# Patient Record
Sex: Male | Born: 1935
Health system: Southern US, Community
[De-identification: ages and names within clinical notes are randomized; demographics above are authoritative.]

## PROBLEM LIST (undated history)

## (undated) DIAGNOSIS — I491 Atrial premature depolarization: Secondary | ICD-10-CM

## (undated) DIAGNOSIS — Z85828 Personal history of other malignant neoplasm of skin: Secondary | ICD-10-CM

## (undated) DIAGNOSIS — I493 Ventricular premature depolarization: Secondary | ICD-10-CM

## (undated) DIAGNOSIS — G4700 Insomnia, unspecified: Secondary | ICD-10-CM

## (undated) DIAGNOSIS — I10 Essential (primary) hypertension: Principal | ICD-10-CM

## (undated) DIAGNOSIS — K219 Gastro-esophageal reflux disease without esophagitis: Secondary | ICD-10-CM

## (undated) DIAGNOSIS — E785 Hyperlipidemia, unspecified: Secondary | ICD-10-CM

## (undated) DIAGNOSIS — C4490 Unspecified malignant neoplasm of skin, unspecified: Secondary | ICD-10-CM

## (undated) DIAGNOSIS — Z86010 Personal history of colonic polyps: Secondary | ICD-10-CM

## (undated) DIAGNOSIS — L570 Actinic keratosis: Secondary | ICD-10-CM

## (undated) DIAGNOSIS — L719 Rosacea, unspecified: Secondary | ICD-10-CM

## (undated) DIAGNOSIS — Z9189 Other specified personal risk factors, not elsewhere classified: Secondary | ICD-10-CM

## (undated) DIAGNOSIS — J984 Other disorders of lung: Secondary | ICD-10-CM

## (undated) DIAGNOSIS — Z87898 Personal history of other specified conditions: Secondary | ICD-10-CM

## (undated) DIAGNOSIS — M1990 Unspecified osteoarthritis, unspecified site: Secondary | ICD-10-CM

## (undated) DIAGNOSIS — I480 Paroxysmal atrial fibrillation: Secondary | ICD-10-CM

## (undated) DIAGNOSIS — I4819 Other persistent atrial fibrillation: Secondary | ICD-10-CM

## (undated) DIAGNOSIS — I499 Cardiac arrhythmia, unspecified: Secondary | ICD-10-CM

## (undated) HISTORY — DX: Paroxysmal atrial fibrillation: I48.0

## (undated) HISTORY — PX: POLYPECTOMY: SHX149

## (undated) HISTORY — DX: Hyperlipidemia, unspecified: E78.5

## (undated) HISTORY — PX: SKIN CANCER EXCISION: SHX779

## (undated) HISTORY — DX: Unspecified malignant neoplasm of skin, unspecified: C44.90

## (undated) HISTORY — DX: Personal history of colonic polyps: Z86.010

## (undated) HISTORY — DX: Gastro-esophageal reflux disease without esophagitis: K21.9

## (undated) HISTORY — DX: Ventricular premature depolarization: I49.3

## (undated) HISTORY — DX: Atrial premature depolarization: I49.1

## (undated) HISTORY — DX: Personal history of other malignant neoplasm of skin: Z85.828

## (undated) HISTORY — PX: LUMBAR DISC SURGERY: SHX700

## (undated) HISTORY — DX: Actinic keratosis: L57.0

## (undated) HISTORY — DX: Other specified personal risk factors, not elsewhere classified: Z91.89

## (undated) HISTORY — DX: Rosacea, unspecified: L71.9

## (undated) HISTORY — PX: EYE SURGERY: SHX253

## (undated) HISTORY — DX: Essential (primary) hypertension: I10

## (undated) HISTORY — DX: Personal history of other specified conditions: Z87.898

## (undated) HISTORY — DX: Insomnia, unspecified: G47.00

## (undated) HISTORY — PX: COLONOSCOPY W/ BIOPSIES AND POLYPECTOMY: SHX1376

## (undated) HISTORY — DX: Other disorders of lung: J98.4

---
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
## 2002-12-02 ENCOUNTER — Encounter: Payer: Self-pay | Admitting: Emergency Medicine

## 2002-12-03 ENCOUNTER — Inpatient Hospital Stay (HOSPITAL_COMMUNITY): Admission: EM | Admit: 2002-12-03 | Discharge: 2002-12-03 | Payer: Self-pay | Admitting: Emergency Medicine

## 2003-06-09 ENCOUNTER — Encounter: Admission: RE | Admit: 2003-06-09 | Discharge: 2003-06-09 | Payer: Self-pay | Admitting: Family Medicine

## 2003-06-09 ENCOUNTER — Encounter: Payer: Self-pay | Admitting: Family Medicine

## 2007-03-02 ENCOUNTER — Encounter: Payer: Self-pay | Admitting: Family Medicine

## 2007-03-26 ENCOUNTER — Ambulatory Visit: Payer: Self-pay | Admitting: Cardiovascular Disease

## 2007-03-26 ENCOUNTER — Encounter: Payer: Self-pay | Admitting: Cardiovascular Disease

## 2007-03-26 ENCOUNTER — Inpatient Hospital Stay (HOSPITAL_COMMUNITY): Admission: EM | Admit: 2007-03-26 | Discharge: 2007-03-27 | Payer: Self-pay | Admitting: Emergency Medicine

## 2007-04-06 ENCOUNTER — Encounter: Payer: Self-pay | Admitting: Internal Medicine

## 2007-04-06 ENCOUNTER — Ambulatory Visit: Payer: Self-pay

## 2007-04-13 ENCOUNTER — Ambulatory Visit: Payer: Self-pay | Admitting: Internal Medicine

## 2007-04-22 ENCOUNTER — Ambulatory Visit: Payer: Self-pay

## 2007-06-24 ENCOUNTER — Ambulatory Visit: Payer: Self-pay | Admitting: Internal Medicine

## 2007-08-10 ENCOUNTER — Ambulatory Visit: Payer: Self-pay | Admitting: Internal Medicine

## 2007-09-15 ENCOUNTER — Ambulatory Visit: Payer: Self-pay | Admitting: Internal Medicine

## 2007-09-15 ENCOUNTER — Ambulatory Visit: Payer: Self-pay

## 2007-12-17 ENCOUNTER — Ambulatory Visit: Payer: Self-pay | Admitting: Internal Medicine

## 2008-05-23 ENCOUNTER — Ambulatory Visit: Payer: Self-pay | Admitting: Internal Medicine

## 2008-06-08 ENCOUNTER — Ambulatory Visit: Payer: Self-pay | Admitting: Internal Medicine

## 2008-08-25 HISTORY — PX: ABLATION: SHX5711

## 2008-10-11 ENCOUNTER — Encounter: Payer: Self-pay | Admitting: Family Medicine

## 2009-01-02 ENCOUNTER — Encounter: Payer: Self-pay | Admitting: Internal Medicine

## 2009-01-12 ENCOUNTER — Encounter: Payer: Self-pay | Admitting: Internal Medicine

## 2009-03-13 ENCOUNTER — Ambulatory Visit: Payer: Self-pay | Admitting: Family Medicine

## 2009-03-13 DIAGNOSIS — L570 Actinic keratosis: Secondary | ICD-10-CM

## 2009-03-13 DIAGNOSIS — L719 Rosacea, unspecified: Secondary | ICD-10-CM

## 2009-03-13 DIAGNOSIS — Z860100 Personal history of colon polyps, unspecified: Secondary | ICD-10-CM | POA: Insufficient documentation

## 2009-03-13 DIAGNOSIS — I10 Essential (primary) hypertension: Secondary | ICD-10-CM | POA: Insufficient documentation

## 2009-03-13 DIAGNOSIS — E785 Hyperlipidemia, unspecified: Secondary | ICD-10-CM | POA: Insufficient documentation

## 2009-03-13 DIAGNOSIS — Z85828 Personal history of other malignant neoplasm of skin: Secondary | ICD-10-CM

## 2009-03-13 DIAGNOSIS — K219 Gastro-esophageal reflux disease without esophagitis: Secondary | ICD-10-CM | POA: Insufficient documentation

## 2009-03-13 HISTORY — DX: Personal history of colonic polyps: Z86.010

## 2009-03-13 HISTORY — DX: Personal history of other malignant neoplasm of skin: Z85.828

## 2009-03-13 HISTORY — DX: Gastro-esophageal reflux disease without esophagitis: K21.9

## 2009-03-13 HISTORY — DX: Personal history of colon polyps, unspecified: Z86.0100

## 2009-03-13 HISTORY — DX: Rosacea, unspecified: L71.9

## 2009-03-13 HISTORY — DX: Actinic keratosis: L57.0

## 2009-03-20 ENCOUNTER — Encounter: Payer: Self-pay | Admitting: Family Medicine

## 2009-03-20 ENCOUNTER — Encounter: Payer: Self-pay | Admitting: Internal Medicine

## 2009-03-22 ENCOUNTER — Encounter: Payer: Self-pay | Admitting: Family Medicine

## 2009-04-07 DIAGNOSIS — Z9189 Other specified personal risk factors, not elsewhere classified: Secondary | ICD-10-CM

## 2009-04-07 DIAGNOSIS — Z87898 Personal history of other specified conditions: Secondary | ICD-10-CM

## 2009-04-07 DIAGNOSIS — I4891 Unspecified atrial fibrillation: Secondary | ICD-10-CM | POA: Insufficient documentation

## 2009-04-07 DIAGNOSIS — I4819 Other persistent atrial fibrillation: Secondary | ICD-10-CM

## 2009-04-07 HISTORY — DX: Personal history of other specified conditions: Z87.898

## 2009-04-07 HISTORY — DX: Other specified personal risk factors, not elsewhere classified: Z91.89

## 2009-04-07 HISTORY — DX: Other persistent atrial fibrillation: I48.19

## 2009-04-17 ENCOUNTER — Encounter: Payer: Self-pay | Admitting: Family Medicine

## 2009-06-06 ENCOUNTER — Ambulatory Visit: Payer: Self-pay | Admitting: Family Medicine

## 2009-06-19 ENCOUNTER — Encounter: Payer: Self-pay | Admitting: Family Medicine

## 2009-06-19 ENCOUNTER — Encounter: Payer: Self-pay | Admitting: Internal Medicine

## 2009-09-11 ENCOUNTER — Ambulatory Visit: Payer: Self-pay | Admitting: Family Medicine

## 2009-09-11 DIAGNOSIS — J984 Other disorders of lung: Secondary | ICD-10-CM

## 2009-09-11 DIAGNOSIS — C4490 Unspecified malignant neoplasm of skin, unspecified: Secondary | ICD-10-CM

## 2009-09-11 DIAGNOSIS — G4700 Insomnia, unspecified: Secondary | ICD-10-CM | POA: Insufficient documentation

## 2009-09-11 HISTORY — DX: Other disorders of lung: J98.4

## 2009-09-11 HISTORY — DX: Insomnia, unspecified: G47.00

## 2009-09-11 HISTORY — DX: Unspecified malignant neoplasm of skin, unspecified: C44.90

## 2009-09-13 LAB — CONVERTED CEMR LAB
ALT: 14 units/L (ref 0–53)
AST: 25 units/L (ref 0–37)
Albumin: 3.8 g/dL (ref 3.5–5.2)
Alkaline Phosphatase: 71 units/L (ref 39–117)
Bilirubin, Direct: 0.1 mg/dL (ref 0.0–0.3)
Cholesterol: 129 mg/dL (ref 0–200)
Direct LDL: 83.6 mg/dL
HDL: 24.4 mg/dL — ABNORMAL LOW (ref >39.00)
Total Bilirubin: 0.7 mg/dL (ref 0.3–1.2)
Total CHOL/HDL Ratio: 5
Total Protein: 6.8 g/dL (ref 6.0–8.3)
Triglycerides: 207 mg/dL — ABNORMAL HIGH (ref 0.0–149.0)
VLDL: 41.4 mg/dL — ABNORMAL HIGH (ref 0.0–40.0)

## 2009-09-19 ENCOUNTER — Telehealth: Payer: Self-pay | Admitting: Family Medicine

## 2009-09-19 ENCOUNTER — Encounter: Payer: Self-pay | Admitting: Family Medicine

## 2009-09-24 ENCOUNTER — Telehealth: Payer: Self-pay | Admitting: Family Medicine

## 2009-10-05 ENCOUNTER — Ambulatory Visit: Payer: Self-pay | Admitting: Family Medicine

## 2010-03-12 ENCOUNTER — Ambulatory Visit: Payer: Self-pay | Admitting: Family Medicine

## 2010-03-12 LAB — CONVERTED CEMR LAB
Cholesterol, target level: 200 mg/dL
HDL goal, serum: 40 mg/dL
LDL Goal: 130 mg/dL

## 2010-05-07 ENCOUNTER — Ambulatory Visit: Payer: Self-pay | Admitting: Family Medicine

## 2010-05-08 LAB — CONVERTED CEMR LAB
ALT: 14 units/L (ref 0–53)
AST: 22 units/L (ref 0–37)
Albumin: 4 g/dL (ref 3.5–5.2)
Alkaline Phosphatase: 68 units/L (ref 39–117)
Bilirubin, Direct: 0.2 mg/dL (ref 0.0–0.3)
Cholesterol: 155 mg/dL (ref 0–200)
HDL: 26.1 mg/dL — ABNORMAL LOW (ref >39.00)
LDL Cholesterol: 102 mg/dL — ABNORMAL HIGH (ref 0–99)
Total Bilirubin: 0.8 mg/dL (ref 0.3–1.2)
Total CHOL/HDL Ratio: 6
Total Protein: 6.7 g/dL (ref 6.0–8.3)
Triglycerides: 135 mg/dL (ref 0.0–149.0)
VLDL: 27 mg/dL (ref 0.0–40.0)

## 2010-05-14 ENCOUNTER — Ambulatory Visit: Payer: Self-pay | Admitting: Family Medicine

## 2010-08-29 ENCOUNTER — Ambulatory Visit
Admission: RE | Admit: 2010-08-29 | Discharge: 2010-08-29 | Payer: Self-pay | Source: Home / Self Care | Attending: Family Medicine | Admitting: Family Medicine

## 2010-09-24 NOTE — Assessment & Plan Note (Signed)
Summary: 2 month rov/njr   Vital Signs:  Patient profile:   77 year old male Weight:      193 pounds O2 Sat:      97 % Temp:     97.6 degrees F oral Pulse rate:   56 / minute Pulse rhythm:   regular BP sitting:   138 / 70  Vitals Entered By: Lynann Beaver CMA (May 14, 2010 10:49 AM) CC: rov, Hypertension Management, Lipid Management Is Patient Diabetic? No Pain Assessment Patient in pain? no        History of Present Illness: Here for follow up for the following  Has history of dyslipidemia. Had been on Advicor but because of cost requested change of medication. Switched to lovastatin 20 mg. No side effects. Lipids reviewed with patient and basically no significant change other than mild elevation of cholesterol since changing to lovastatin. No history of CAD. Does have history of atrial fibrillation with previous ablation procedure.  Hypertension. Takes nadolol 20 mg daily. Home blood pressures reviewed and mostly 120s to 130 systolic. No headaches or dizziness.  No chest pain.  History of some chronic insomnia. Takes low-dose alprazolam and recently refilled. No gait or balance problems.  No hx of misuse.  No ETOH use.  Hypertension History:      He denies headache, chest pain, palpitations, dyspnea with exertion, orthopnea, peripheral edema, visual symptoms, neurologic problems, and syncope.        Positive major cardiovascular risk factors include male age 24 years old or older, hyperlipidemia, and hypertension.  Negative major cardiovascular risk factors include no history of diabetes, negative family history for ischemic heart disease, and non-tobacco-user status.        Further assessment for target organ damage reveals no history of ASHD, stroke/TIA, or peripheral vascular disease.    Lipid Management History:      Positive NCEP/ATP III risk factors include male age 24 years old or older, HDL cholesterol less than 40, and hypertension.  Negative NCEP/ATP III risk  factors include non-diabetic, no family history for ischemic heart disease, non-tobacco-user status, no ASHD (atherosclerotic heart disease), no prior stroke/TIA, no peripheral vascular disease, and no history of aortic aneurysm.      Current Medications (verified): 1)  Lovastatin 20 Mg Tabs (Lovastatin) .... One By Mouth At Bedtime 2)  Nadolol 20 Mg Tabs (Nadolol) .... One Tab Daily 3)  Aspirin 325 Mg Tabs (Aspirin) .... Once Daily 4)  Prilosec Otc 20 Mg Tbec (Omeprazole Magnesium) .... Once Daily 5)  Saw Palmetto 1000 Mg Caps (Saw Palmetto (Serenoa Repens)) .... Qd 6)  Metronidazole 0.75 % Crea (Metronidazole) .... As Needed 7)  Efudex 5 % Crea (Fluorouracil) .... As Needed 8)  Alprazolam 0.25 Mg Tabs (Alprazolam) .... One By Mouth At Bedtime As Needed Insomnia 9)  Metrogel 1 % Gel (Metronidazole) .... Daily On Face/nose As Directed  Allergies (verified): No Known Drug Allergies  Past History:  Past Medical History: Last updated: 04/07/2009 Kidney stones BENIGN PROSTATIC HYPERTROPHY, HX OF (ICD-V13.8) ATRIAL FIBRILLATION (ICD-427.31) ACTINIC KERATOSIS (ICD-702.0) ROSACEA (ICD-695.3) HYPERTENSION (ICD-401.9) HYPERLIPIDEMIA (ICD-272.4) GERD (ICD-530.81) COLONIC POLYPS, HX OF (ICD-V12.72) SKIN CANCER, HX OF (ICD-V10.83)    Review of Systems  The patient denies anorexia, fever, weight loss, weight gain, chest pain, syncope, dyspnea on exertion, peripheral edema, prolonged cough, headaches, hemoptysis, abdominal pain, melena, hematochezia, and depression.    Physical Exam  General:  Well-developed,well-nourished,in no acute distress; alert,appropriate and cooperative throughout examination Head:  Normocephalic and atraumatic without obvious  abnormalities. No apparent alopecia or balding. Eyes:  pupils equal, pupils round, and pupils reactive to light.   Ears:  External ear exam shows no significant lesions or deformities.  Otoscopic examination reveals clear canals, tympanic  membranes are intact bilaterally without bulging, retraction, inflammation or discharge. Hearing is grossly normal bilaterally. Mouth:  Oral mucosa and oropharynx without lesions or exudates.  Teeth in good repair. Neck:  No deformities, masses, or tenderness noted. Lungs:  Normal respiratory effort, chest expands symmetrically. Lungs are clear to auscultation, no crackles or wheezes. Heart:  Normal rate and regular rhythm. S1 and S2 normal without gallop, murmur, click, rub or other extra sounds. Extremities:  no edema or clubbing. Cervical Nodes:  No lymphadenopathy noted Psych:  normally interactive, good eye contact, not anxious appearing, and not depressed appearing.     Impression & Recommendations:  Problem # 1:  INSOMNIA, CHRONIC (ICD-307.42) refilled alprazolam for as needed use.  Problem # 2:  HYPERTENSION (ICD-401.9) borderline control.   Cont to monitor. His updated medication list for this problem includes:    Nadolol 20 Mg Tabs (Nadolol) ..... One tab daily  Problem # 3:  HYPERLIPIDEMIA (ICD-272.4) labs reviewed.  HDL      essentially unchanged off Niacin. His updated medication list for this problem includes:    Lovastatin 20 Mg Tabs (Lovastatin) ..... One by mouth at bedtime  Complete Medication List: 1)  Lovastatin 20 Mg Tabs (Lovastatin) .... One by mouth at bedtime 2)  Nadolol 20 Mg Tabs (Nadolol) .... One tab daily 3)  Aspirin 325 Mg Tabs (Aspirin) .... Once daily 4)  Prilosec Otc 20 Mg Tbec (Omeprazole magnesium) .... Once daily 5)  Saw Palmetto 1000 Mg Caps (Saw palmetto (serenoa repens)) .... Qd 6)  Metronidazole 0.75 % Crea (Metronidazole) .... As needed 7)  Efudex 5 % Crea (Fluorouracil) .... As needed 8)  Alprazolam 0.25 Mg Tabs (Alprazolam) .... One by mouth at bedtime as needed insomnia 9)  Metrogel 1 % Gel (Metronidazole) .... Daily on face/nose as directed  Hypertension Assessment/Plan:      The patient's hypertensive risk group is category B: At  least one risk factor (excluding diabetes) with no target organ damage.  His calculated 10 year risk of coronary heart disease is 33 %.  Today's blood pressure is 138/70.    Lipid Assessment/Plan:      Based on NCEP/ATP III, the patient's risk factor category is "2 or more risk factors and a calculated 10 year CAD risk of > 20%".  The patient's lipid goals are as follows: Total cholesterol goal is 200; LDL cholesterol goal is 130; HDL cholesterol goal is 40; Triglyceride goal is 150.    Patient Instructions: 1)  Please schedule a follow-up appointment in 6 months .

## 2010-09-24 NOTE — Assessment & Plan Note (Signed)
Summary: 6 month rov/njr   Vital Signs:  Patient profile:   77 year old male Weight:      187 pounds Temp:     98.3 degrees F oral BP sitting:   140 / 84  (left arm) Cuff size:   regular  Vitals Entered By: Sid Falcon LPN (September 11, 2009 10:28 AM) CC: 6 month folow-up   History of Present Illness: Patient here for followup regarding multiple items.  History atrial fibrillation. Ablation procedure at [facility]. Sinus rhythm for several months now. History dyslipidemia treated with Advicor. Patient intolerant to generic in the past. Would like to remain on brand-name Advicor. Due for lipid repeat.  Patient had nonspecific pulmonary nodule chest x-ray last year and CT chest last Nov revealing nonspecific nodule with recommended 1 year f/u. No cough, weight loss, dyspnea,  or appetite changes. Nonsmoker. Needs repeat.  History of chronic insomnia. No caffeine late in the day. No napping. No alcohol use. Tylenol PM causes a hangover drowsiness. Has tried low-dose alprazolam 0.25 mg the past which works well. Sleep hygiene issues discussed.  Has seen dermatology after recent referral and multiple skin cancers-squamous and basal cell.  He continues f/u there.   Allergies (verified): No Known Drug Allergies  Past History:  Past Medical History: Last updated: 04/07/2009 Kidney stones BENIGN PROSTATIC HYPERTROPHY, HX OF (ICD-V13.8) ATRIAL FIBRILLATION (ICD-427.31) ACTINIC KERATOSIS (ICD-702.0) ROSACEA (ICD-695.3) HYPERTENSION (ICD-401.9) HYPERLIPIDEMIA (ICD-272.4) GERD (ICD-530.81) COLONIC POLYPS, HX OF (ICD-V12.72) SKIN CANCER, HX OF (ICD-V10.83)    Past Surgical History: Last updated: 04/07/2009 * HX OF LUMBAR DISK  SURGERY DUE TO RUPTURE POLYPECTOMY, HX OF (ICD-V15.9)  Family History: Last updated: 04/07/2009 Family history colon cancer, parent Family history heart disease, parent, blood relative Family History Hypertension Father:Died at age 75 Colon  cancer Mother:Died at age 64 Cancer and dementia Siblings: Brother and Sister hx of CAD both have had CABG  Social History: Last updated: 03/13/2009 Occupation: Married Alcohol use-no Previous smoker  Review of Systems  The patient denies anorexia, fever, weight loss, weight gain, chest pain, syncope, dyspnea on exertion, peripheral edema, prolonged cough, headaches, hemoptysis, abdominal pain, melena, hematochezia, severe indigestion/heartburn, hematuria, and incontinence.    Physical Exam  General:  Well-developed,well-nourished,in no acute distress; alert,appropriate and cooperative throughout examination Mouth:  Oral mucosa and oropharynx without lesions or exudates.  Teeth in good repair. Neck:  No deformities, masses, or tenderness noted. Lungs:  Normal respiratory effort, chest expands symmetrically. Lungs are clear to auscultation, no crackles or wheezes. Heart:  normal rate, regular rhythm, and no gallop.   Extremities:  no peripheral edema or clubbing Psych:  Oriented X3, normally interactive, good eye contact, not anxious appearing, and not depressed appearing.     Impression & Recommendations:  Problem # 1:  ATRIAL FIBRILLATION (ICD-427.31) currently sinus rhythm The following medications were removed from the medication list:    Propafenone Hcl 225 Mg Tabs (Propafenone hcl) ..... One tab two times a day His updated medication list for this problem includes:    Nadolol 20 Mg Tabs (Nadolol) .Marland Kitchen... 1/2 tab daily    Aspirin 325 Mg Tabs (Aspirin) ..... Once daily  Problem # 2:  INSOMNIA, CHRONIC (ICD-307.42) Handout given.  Sleep hygiene discussed.  Low dose alprazolam 0.25 at bedtime and he wil try to use sparingly.  Problem # 3:  PULMONARY NODULE (ICD-518.89) CXR and will likely need f/u CT chest. Orders: T-2 View CXR (71020TC)  Problem # 4:  HYPERLIPIDEMIA (ICD-272.4) Get prior approval to remain  on Advicor. His updated medication list for this problem  includes:    Advicor 500-20 Mg Xr24h-tab (Niacin-lovastatin) ..... Once daily  Orders: TLB-Lipid Panel (80061-LIPID) TLB-Hepatic/Liver Function Pnl (80076-HEPATIC) Venipuncture (16109)  Problem # 5:  CARCINOMA, SKIN, SQUAMOUS CELL (ICD-173.9) followed  now by dermatology.  Complete Medication List: 1)  Advicor 500-20 Mg Xr24h-tab (Niacin-lovastatin) .... Once daily 2)  Nadolol 20 Mg Tabs (Nadolol) .... 1/2 tab daily 3)  Aspirin 325 Mg Tabs (Aspirin) .... Once daily 4)  Prilosec Otc 20 Mg Tbec (Omeprazole magnesium) .... Once daily 5)  Saw Palmetto 1000 Mg Caps (Saw palmetto (serenoa repens)) .... Qd 6)  Metronidazole 0.75 % Crea (Metronidazole) .... As needed 7)  Efudex 5 % Crea (Fluorouracil) .... As needed 8)  Alprazolam 0.25 Mg Tabs (Alprazolam) .... One by mouth at bedtime as needed insomnia  Patient Instructions: 1)  Please schedule a follow-up appointment in 6 months .  Prescriptions: ALPRAZOLAM 0.25 MG TABS (ALPRAZOLAM) one by mouth at bedtime as needed insomnia  #30 x 5   Entered and Authorized by:   Evelena Peat MD   Signed by:   Evelena Peat MD on 09/11/2009   Method used:   Print then Give to Patient   RxID:   [id number]

## 2010-09-24 NOTE — Assessment & Plan Note (Signed)
Summary: 6 mo rov/mm/pt rsc/cjr   Vital Signs:  Patient profile:   77 year old male Weight:      190 pounds Temp:     98.7 degrees F oral BP sitting:   150 / 84  (left arm) Cuff size:   regular  Vitals Entered By: Sid Falcon LPN (March 12, 2010 10:55 AM)  Serial Vital Signs/Assessments:  Time      Position  BP       Pulse  Resp  Temp     By                     144/70                         Evelena Peat MD  CC: 6 month follow-up, Lipid Management, Hypertension Management   History of Present Illness: Here for follow up multiple med problems.  Hx a fib with prior ablation procedure and no recurrence.  Hypertension History:      He denies headache, chest pain, palpitations, dyspnea with exertion, orthopnea, peripheral edema, visual symptoms, neurologic problems, and syncope.  Further comments include: Bps running 140s systolic at home.        Positive major cardiovascular risk factors include male age 48 years old or older, hyperlipidemia, and hypertension.  Negative major cardiovascular risk factors include no history of diabetes and non-tobacco-user status.        Further assessment for target organ damage reveals no history of ASHD, stroke/TIA, or peripheral vascular disease.    Lipid Management History:      Positive NCEP/ATP III risk factors include male age 92 years old or older, HDL cholesterol less than 40, and hypertension.  Negative NCEP/ATP III risk factors include non-diabetic, non-tobacco-user status, no ASHD (atherosclerotic heart disease), no prior stroke/TIA, no peripheral vascular disease, and no history of aortic aneurysm.      Preventive Screening-Counseling & Management  Alcohol-Tobacco     Smoking Status: never  Allergies (verified): No Known Drug Allergies  Social History: Smoking Status:  never  Review of Systems  The patient denies anorexia, weight loss, chest pain, syncope, dyspnea on exertion, peripheral edema, abdominal pain, melena,  hematochezia, and severe indigestion/heartburn.    Physical Exam  General:  Well-developed,well-nourished,in no acute distress; alert,appropriate and cooperative throughout examination Mouth:  Oral mucosa and oropharynx without lesions or exudates.  Teeth in good repair. Neck:  No deformities, masses, or tenderness noted. Lungs:  Normal respiratory effort, chest expands symmetrically. Lungs are clear to auscultation, no crackles or wheezes. Heart:  normal rate and regular rhythm.   Extremities:  no edema.   Impression & Recommendations:  Problem # 1:  HYPERTENSION (ICD-401.9) Assessment Deteriorated increase Nadalol to one full tablet daily. His updated medication list for this problem includes:    Nadolol 20 Mg Tabs (Nadolol) ..... One tab daily  Problem # 2:  HYPERLIPIDEMIA (ICD-272.4)  pt requests change to less expensive med than Advicor.  Will start Lovastitin 20 mg by mouth once daily and will reassess lipids in 2 months. His updated medication list for this problem includes:    Lovastatin 20 Mg Tabs (Lovastatin) ..... One by mouth at bedtime  Orders: Prescription Created Electronically [phone])  Problem # 3:  ATRIAL FIBRILLATION (ICD-427.31) Assessment: Improved  His updated medication list for this problem includes:    Nadolol 20 Mg Tabs (Nadolol) ..... One tab daily    Aspirin 325 Mg Tabs (Aspirin) .Marland KitchenMarland KitchenMarland KitchenMarland Kitchen  Once daily  Complete Medication List: 1)  Lovastatin 20 Mg Tabs (Lovastatin) .... One by mouth at bedtime 2)  Nadolol 20 Mg Tabs (Nadolol) .... One tab daily 3)  Aspirin 325 Mg Tabs (Aspirin) .... Once daily 4)  Prilosec Otc 20 Mg Tbec (Omeprazole magnesium) .... Once daily 5)  Saw Palmetto 1000 Mg Caps (Saw palmetto (serenoa repens)) .... Qd 6)  Metronidazole 0.75 % Crea (Metronidazole) .... As needed 7)  Efudex 5 % Crea (Fluorouracil) .... As needed 8)  Alprazolam 0.25 Mg Tabs (Alprazolam) .... One by mouth at bedtime as needed insomnia 9)  Metrogel 1 % Gel  (Metronidazole) .... Daily on face/nose as directed  Hypertension Assessment/Plan:      The patient's hypertensive risk group is category B: At least one risk factor (excluding diabetes) with no target organ damage.  Today's blood pressure is 150/84.    Lipid Assessment/Plan:      Based on NCEP/ATP III, the patient's risk factor category is "0-1 risk factors".  The patient's lipid goals are as follows: Total cholesterol goal is 200; LDL cholesterol goal is 130; HDL cholesterol goal is 40; Triglyceride goal is 150.    Patient Instructions: 1)  Please schedule a follow-up appointment in 2 months.  2)  Hepatic Panel prior to visit ICD-9: 272.4 3)  Lipid panel prior to visit ICD-9 : 272.4 Prescriptions: NADOLOL 20 MG TABS (NADOLOL) one tab daily  #90 x 3   Entered and Authorized by:   Evelena Peat MD   Signed by:   Evelena Peat MD on 03/12/2010   Method used:   Faxed to ...       [facility]order)             , [place]         Ph:        Fax: [phone]   RxID:   [id number] LOVASTATIN 20 MG TABS (LOVASTATIN) one by mouth at bedtime  #90 x 3   Entered and Authorized by:   Evelena Peat MD   Signed by:   Evelena Peat MD on 03/12/2010   Method used:   Electronically to        [facility]  [phone]* (retail)       [address]       [place], [place]  [address]       Ph: [phone] or [phone]       Fax: [phone]   RxID:   [id number]

## 2010-09-24 NOTE — Progress Notes (Signed)
Summary: Prior Authorization Advicor  Phone Note Call from Patient   Caller: Patient [phone] Reason for Call: Talk to Nurse Summary of Call: Pts wife called to ck status of Prior Authorization.... Pts wife stated that she contacted Pharmacy / Ins. and they were supposed to have faxed (for a second time) the PA form to [employer].Marland KitchenMarland KitchenMarland KitchenPts wife adv that her husband is running out of meds and ins won't cover till PA is completed and faxed in.Marland KitchenMarland KitchenMarland KitchenPts wife was adv that if fax was received then it would have been passed along to nurse for completion... Pts wife adv that she can be reached at [phone] with any questions or concerns. Initial call taken by: Debbra Riding,  September 24, 2009 10:42 AM  Follow-up for Phone Call        This fax did not come to Triage. Follow-up by: Lynann Beaver CMA,  September 24, 2009 10:47 AM  Additional Follow-up for Phone Call Additional follow up Details #1::        PA on Dr Lucie Leather desk Sid Falcon LPN  September 24, 2009 12:29 PM  PA form completed. Additional Follow-up by: Evelena Peat MD,  September 24, 2009 5:29 PM    Additional Follow-up for Phone Call Additional follow up Details #2::    Pt informed prior authorization faxed to [phone], confirmation received, pt informed Document will be scanned to chart Follow-up by: Sid Falcon LPN,  September 25, 2009 10:53 AM

## 2010-09-24 NOTE — Progress Notes (Signed)
Summary: Advicor Prior Authorization   Phone Note Call from Patient Call back at Home Phone [phone]   Caller: Spouse Call For: Evelena Peat MD Summary of Call: Pt wife calling to check on at Prior Authorization for mail order Advicor 500-20, once daily.  [facility] said they would be faxing a Dispensing optician for mail order.  Insurance is not paying, pt is almost out.  Initial call taken by: Sid Falcon LPN,  September 19, 2009 9:51 AM  Follow-up for Phone Call        I told pt we do not have this prior authorization unless it is in Dr. Lucie Leather mail?? Follow-up by: Lynann Beaver CMA,  September 19, 2009 10:58 AM  Additional Follow-up for Phone Call Additional follow up Details #1::        Called pt wife, told her we do not have the PA request.  Wife has requested they fax another. Sid Falcon LPN  September 19, 2009 11:11 AM

## 2010-09-24 NOTE — Medication Information (Signed)
Summary: Refill for [facility]itor/[facility]  Refill for Lipitor/[facility]   Imported By: Maryln Gottron 09/28/2009 15:14:50  _____________________________________________________________________  External Attachment:    Type:   Image     Comment:   External Document

## 2010-09-26 NOTE — Assessment & Plan Note (Signed)
Summary: stitches removal from finger/njr   Vital Signs:  Patient profile:   77 year old male Temp:     97.8 degrees F oral BP sitting:   120 / 62  (left arm) Cuff size:   regular  Vitals Entered By: Sid Falcon LPN (August 29, 2010 11:38 AM)  History of Present Illness: L thumb laceration 9 days ago. Skill saw and wood piece that was cut flew into thumb with laceration.  Went to urgent care for sutures-7 placed and only 4 left at this time. No pain, numbness, or weakness.  No retained foreign bodies. Last tetanus unknown.  Meds reviewed.  hx A FIB with ablation procedure and doing well.  Allergies (verified): 1)  Augmentin (Amoxicillin-Pot Clavulanate)  Past History:  Past Medical History: Last updated: 04/07/2009 Kidney stones BENIGN PROSTATIC HYPERTROPHY, HX OF (ICD-V13.8) ATRIAL FIBRILLATION (ICD-427.31) ACTINIC KERATOSIS (ICD-702.0) ROSACEA (ICD-695.3) HYPERTENSION (ICD-401.9) HYPERLIPIDEMIA (ICD-272.4) GERD (ICD-530.81) COLONIC POLYPS, HX OF (ICD-V12.72) SKIN CANCER, HX OF (ICD-V10.83)   PMH reviewed for relevance  Physical Exam  General:  Well-developed,well-nourished,in no acute distress; alert,appropriate and cooperative throughout examination Lungs:  Normal respiratory effort, chest expands symmetrically. Lungs are clear to auscultation, no crackles or wheezes. Heart:  normal rate and regular rhythm.   Extremities:  L thumb flap laceration with no signs of infection.  4 sutures in place.  REmoved without difficulty and steristrips applied.  Full ROM thumb. Neurologic:  normal sensation thumb.    Impression & Recommendations:  Problem # 1:  LACERATION, LEFT THUMB (ICD-883.0) Assessment New sutures removed and steristrips placed.  Healing well. Tdap given.  Problem # 2:  ATRIAL FIBRILLATION (ICD-427.31)  His updated medication list for this problem includes:    Nadolol 20 Mg Tabs (Nadolol) ..... One tab daily    Aspirin 325 Mg Tabs (Aspirin)  ..... Once daily  Complete Medication List: 1)  Lovastatin 20 Mg Tabs (Lovastatin) .... One by mouth at bedtime 2)  Nadolol 20 Mg Tabs (Nadolol) .... One tab daily 3)  Aspirin 325 Mg Tabs (Aspirin) .... Once daily 4)  Prilosec Otc 20 Mg Tbec (Omeprazole magnesium) .... Once daily 5)  Saw Palmetto 1000 Mg Caps (Saw palmetto (serenoa repens)) .... Qd 6)  Metronidazole 0.75 % Crea (Metronidazole) .... As needed 7)  Efudex 5 % Crea (Fluorouracil) .... As needed 8)  Alprazolam 0.25 Mg Tabs (Alprazolam) .... One by mouth at bedtime as needed insomnia 9)  Metrogel 1 % Gel (Metronidazole) .... Daily on face/nose as directed 10)  Vitamin D 1000 Unit Tabs (Cholecalciferol) .... 2 tabs daily  Other Orders: Tdap => 7yrs IM (04540) Admin 1st Vaccine (98119)   Orders Added: 1)  Tdap => 60yrs IM [90715] 2)  Admin 1st Vaccine [90471] 3)  Est. Patient Level III [14782]   Immunizations Administered:  Tetanus Vaccine:    Vaccine Type: Tdap    Site: left deltoid    Mfr: GlaxoSmithKline    Dose: 0.5 ml    Route: IM    Given by: Sid Falcon LPN    Exp. Date: 06/14/2012    Lot #: [id number]    VIS given: 07/12/08 version given August 29, 2010.   Immunizations Administered:  Tetanus Vaccine:    Vaccine Type: Tdap    Site: left deltoid    Mfr: GlaxoSmithKline    Dose: 0.5 ml    Route: IM    Given by: Sid Falcon LPN    Exp. Date: 06/14/2012    Lot #: [id number]  VIS given: 07/12/08 version given August 29, 2010.

## 2010-10-08 ENCOUNTER — Telehealth: Payer: Self-pay | Admitting: Family Medicine

## 2010-10-08 DIAGNOSIS — E785 Hyperlipidemia, unspecified: Secondary | ICD-10-CM

## 2010-10-08 NOTE — Telephone Encounter (Signed)
to Pt called and needs script Lovastatin 20mg   3 month supply. [facility] [phone].

## 2010-10-25 MED ORDER — LOVASTATIN 20 MG PO TABS: 20.0000 mg | ORAL_TABLET | Freq: Every day | ORAL | Status: DC

## 2010-10-25 NOTE — Telephone Encounter (Signed)
Will send

## 2010-10-28 MED ORDER — LOVASTATIN 20 MG PO TABS: 20.0000 mg | ORAL_TABLET | Freq: Every day | ORAL | Status: DC

## 2010-10-28 NOTE — Telephone Encounter (Signed)
Addended by: Sid Falcon on: 10/28/2010 06:31 PM   Modules accepted: Orders

## 2010-12-10 ENCOUNTER — Encounter: Payer: Self-pay | Admitting: Family Medicine

## 2011-01-07 NOTE — Discharge Summary (Signed)
[name], [name] NO.:  [id number]   MEDICAL RECORD NO.:  [id number]          PATIENT TYPE:  INP   LOCATION:  4709                         FACILITY:  [facility]   PHYSICIAN:  Steven Canning. Ladona Ridgel, MD    DATE OF BIRTH:  1932/01/14   DATE OF ADMISSION:  03/26/2007  DATE OF DISCHARGE:  03/27/2007                               DISCHARGE SUMMARY   PROCEDURES:  None.   TIME SPENT AT DISCHARGE:  33 minutes.   PRIMARY FINAL DISCHARGE DIAGNOSES:  1. Paroxysmal atrial fibrillation with rapid ventricular response.  2. Hypertension.  3. Hyperlipidemia.  4. Benign prostatic hypertrophy.  5. Gastroesophageal reflux disease.  6. Rosacea.  7. History of ruptured lumbar disk with surgical repair.  8. Status post colonoscopy with polypectomy.  9. History of exercise Myoview in 2004 showing no scar or ischemia and      an ejection fraction of 61%.  10.Family history of coronary artery disease in his siblings.  11.Intolerance to antihistamines with urinary retention.\   HOSPITAL COURSE:  [name] is a 77 year old male with a history of  paroxysmal atrial fibrillation.  He has been managed by p.r.n. beta-  blockers, but he was having more frequent episodes and his wife reports  that he was weak and hypotensive with a systolic blood pressure in the  80s.  He came to the emergency room and was admitted for further  evaluation.   He was initially placed on heparin for anticoagulation and Coreg.  He  spontaneously converted to sinus rhythm.  A TSH and troponin were within  normal limits.  The situation was discussed by Dr. Diona Browner with the  patient and his wife.  He is to continue aspirin, and we will try  increasing it to 325 mg daily to see is this is tolerated from a GI  standpoint.  His Norvasc was decreased so that we could add Coreg 3.125  b.i.d.  He is not to take the p.r.n. Toprol-XL, as his wife stated she  was afraid to give it to him because of his hypotension anyway.   He is  to follow up with Dr. Graciela Husbands and get an outpatient echocardiogram prior  to that visit.  Further discussion of antiarrhythmics and the  possibility of Coumadin is to be done at that point.   ACTIVITY:  His activity level is to be increased gradually.   DIET:  He is to stick to a low-sodium, heart healthy diet.   FOLLOW UP:  He is to follow up with Dr. Graciela Husbands, and the office will call.  He is to follow up with Dr. Caryl Never as needed.   DISCHARGE MEDICATIONS:  1. Coreg 3.125 mg b.i.d.  2. Norvasc 5 mg one-half tablet daily  3. Aspirin 325 mg daily.  4. Advicor 500/20 daily.  5. Benicar 20 mg a day.  6. Flomax 0.4 mg daily  7. Topical Flagyl p.r.n.  8. Prilosec 20 mg a day.  9. Vitamin E 400 IU daily  10.He is not to take Toprol-XL 25 mg.      Steven Demark, PA-C  Steven Canning. Ladona Ridgel, MD  Electronically Signed    RB/MEDQ  D:  03/27/2007  T:  03/28/2007  Job:  [id number]   cc:   [name], M.D.

## 2011-01-07 NOTE — Assessment & Plan Note (Signed)
[facility]                         ELECTROPHYSIOLOGY OFFICE NOTE   NAME:[name], [name]                     MRN:          [id number]  DATE:08/10/2007                            DOB:          14-Feb-1932    Mr. and Mrs. Yepes come in today.  They are the parent of Artis Flock, who is now doing increasing poorly.  His tolerance of his various  drugs was best for nadolol and Toprol and very poor for verapamil.  The  nadolol seemed to be associated with fewer episodes of atrial  fibrillation.  Blood pressures were also somewhat better, that it is not  quite as low in the 110-130 range, which is important, as most of his  presyncope was felt to be vasomotor.  He has some modest degree of  bradycardia on the data that his wife brings with her with heart rates  in the high 40s and low 50s.  There are no untoward __________  that I  can assign to this.   His medications currently include the Benicar, Prilosec, aspirin,  Advicor, and the nadolol 20 mg a day.   PHYSICAL EXAMINATION:  His blood pressure was 122/72.  His pulse was 60.  LUNGS:  Clear.  CARDIAC:  Heart sounds were regular.  SKIN:  Warm and dry.  EXTREMITIES:  No edema.   IMPRESSION:  1. Paroxysmal atrial fibrillation.  2. Modest bradycardia.  3. Presyncope that I think is vasomotor, related to atrial      fibrillation.   Mr. Jacuinde is actually doing quite well.  Will plan to continue him on  his nadolol 20 mg a day.  In the event that we have evidence that his  bradycardia is a problem, I would try an ISA beta blocker.   At this point, we will defer using flecainide, as his symptoms seem to  be well controlled on the nadolol.  I will see him in the spring.     Duke Salvia, MD, [facility]  Electronically Signed    SCK/MedQ  DD: 08/10/2007  DT: 08/10/2007  Job #: [id number]

## 2011-01-07 NOTE — Letter (Signed)
April 13, 2007    Jonelle Sidle, MD  [phone] [address]  [place], [place] [address]   RE:  Steven, Holder  MRN:  [id number]  /  DOB:  [birth year]   Dear Doreatha Martin,   It was a pleasure to see [name] because of his atrial  fibrillation.   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  Over the  last 6-8 weeks he has had increasing problems with it, with both more  frequent and more prolonged episodes.  Notably, his episodes have also  been associated with significant hypotension with blood pressures in the  70-80 range.  This has been true notwithstanding the relative slowing of  his heart rate, that was accomplished, after you gave him carvedilol in  the hospital a couple of weeks ago.  Further evaluations at that time  also included a normal CBC, normal thyroids, and normal chemistries.  An  echo done just prior to this visit demonstrated normal left ventricular  function, and normal left atrial size.   His strong embolic risk factors are notable for his blood pressure only,  giving a [place] score of 1.  His cardiac risk factors are broadly  negative.   The other thing that has happened in his medical regimen has been the  addition of Flomax, but this post-dates the aggravation of his symptoms.   MEDICATIONS CURRENTLY INCLUDE:  1. Flomax.  2. Carvedilol 3.125.  3. Benicar 20  4. Amlodipine 2.5.  5. As well as aspirin.   On examination today his blood pressure is 122/72 with a pulse of 67.  Blood pressures from home range in the 100-120 range.  His neck veins  were flat.  His carotids were brisk and full bilaterally without bruits.  The back was without kyphosis or scoliosis.  His lungs were clear.  Heart sounds were regular without murmurs or gallops.  The abdomen was  soft.  The extremities were without peripheral edema.   Electrocardiogram dated today demonstrated a sinus rhythm.  Electrocardiogram from the hospital was clearly atrial fibrillation.   IMPRESSION:  1. Paroxysmal atrial fibrillation with a rapid, and not so rapid,      ventricular response.  2. Significant hypotension associated with #1.  3. Thromboembolic risk factors notable for hypertension.   Mr. Starnes is having recurrent paroxysms of rapid, and not so rapid  atrial fibrillation; each of which has been associated with significant  hypotension.  Given that, I suspect that this represents a neurally  mediated reflex.  This is true for most supraventricular tachycardias  that are associated with profound hypertension.  As such, the treatment  of this is going to the directed at preventing his atrial fibrillation  as opposed to controlling the ventricular response.  Your carvedilol has  had some benefit, but not significant when typically the beta blockers  are associated with only about a 10-15% reduction in atrial fibrillation  risk.  To that end, I think, that antiarrhythmic drug therapy is likely  going to be necessary, if his frequency of symptoms continues as it is.  To that end, I have scheduled him for a stress Myoview scan that would  allow [place] to use a 1C antiarrhythmic drug.  We will plan to discuss this  with him after the study.   In the interim, I have asked him to discontinue his amlodipine and  double up his carvedilol  to hopefully get some heart rate slowing  benefit.   Thanks very much for asking [place] to see him.    Sincerely,      Duke Salvia, MD, [facility]  Electronically Signed    SCK/MedQ  DD: 04/13/2007  DT: 04/14/2007  Job #: [id number]   CC:    Evelena Peat, M.D.

## 2011-01-07 NOTE — Assessment & Plan Note (Signed)
[facility]                         ELECTROPHYSIOLOGY OFFICE NOTE   NAME:[name], [name]                     MRN:          [id number]  DATE:12/17/2007                            DOB:          [birth year]    HISTORY:  [name] is doing much better recently with Steven atrial  fibrillation with only 1 episode since Rythmol was started a couple of  months ago.  It lasted about 10 minutes.   He continues to have problems with dizziness.  It is Steven and [name]'s  impression that dizziness correlates most closely with relative  hypotension.   MEDICATIONS:  Review of Steven medications demonstrates that he is on  Benicar as well as Flomax and  nadolol in conjunction with Steven Rythmol.   PHYSICAL EXAMINATION:  VITAL SIGNS:  Blood pressure was 98/58, pulse was  55, Steven weight was stable at 192.  LUNGS:  Clear.  HEART:  Sounds were regular without murmurs or gallops.  ABDOMEN:  Soft.  SKIN:  Warm and dry.  EXTREMITIES:  Without peripheral edema.   DIAGNOSTICS:  Electrocardiogram dated today demonstrated sinus rhythm at  55 with interval of 0.19, 0.09, 04.1, axis was 35 degrees.   IMPRESSION:  1. Paroxysmal atrial fibrillation.  2. Recurrent dizziness, it seems to correlate with relative      hypotension.  3. Prostatism on Flomax.   DISCUSSION:  [name] is stable from atrial fibrillation point of  view and in fact is doing really very well.  Given the beta blocker  component of the propafenone, we may well be able to stop the  concomitant nadolol, but what I would like to do first is to stop Steven  Benicar.  In the event that Steven symptoms do not abate after 2 weeks, he  is to stop Steven nadolol and then he can give [place] a call in about a month.  My thought would be that if Steven symptoms persist thereafter, that he  should discuss with Dr. Caryl Never the possibility of moving to Avodart  versus Flomax.  [name] told me something I did not know, that  it  takes a while for the Avodart to work which would make sense given its  mechanism being of reduction of prostatic hypertrophy, but maybe they  can both be taken concurrently to allow for the benefit of the Avodart  to accrue.   FOLLOW UP:  We will see him again 6 months' time.     Duke Salvia, MD, [facility]  Electronically Signed    SCK/MedQ  DD: 12/17/2007  DT: 12/17/2007  Job #: [id number]   cc:   Evelena Peat, M.D.

## 2011-01-07 NOTE — H&P (Signed)
NAMEJAHVIER, ALDEA NO.:  [id number]   MEDICAL RECORD NO.:  [id number]          PATIENT TYPE:  EMS   LOCATION:  MAJO                         FACILITY:  [facility]   PHYSICIAN:  Noralyn Pick. Eden Emms, MD, FACCDATE OF BIRTH:  June 18, 1932   DATE OF ADMISSION:  03/26/2007  DATE OF DISCHARGE:                              HISTORY & PHYSICAL   PRIMARY CARDIOLOGIST:  Dr. Graciela Husbands, last seen in 2004.   PRIMARY CARE Rodney Wigger:  Dr. Caryl Never.   PATIENT PROFILE:  A 77 year old old Caucasian male with prior history of  paroxysmal atrial fibrillation who presents with recurrent atrial  fibrillation.   PROBLEMS:  1. Paroxysmal atrial fibrillation diagnosed in 1994, last hospitalized      in 2004      a.     December 14, 2002, exercise Myoview, exercise time 6 minutes,       maximal heart rate 127 beats per minute, nonspecific ST changes.       No ischemia or infarct..  EF 61%.      b.     April 2004, a 2-D echo, normal LV function without       structural abnormalities.  2. Hypertension.  3. Hyperlipidemia.  4. GERD.  5. BPH.  6. Rosacea  7. Ruptured lumbar disk status post surgery.  8. History of colon polyps status post polypectomy.   HISTORY OF PRESENT ILLNESS:  A 77 year old Caucasian male with history  of PAF dating back to 1994 last evaluated by Dr. Graciela Husbands 2004, and he has  been managed with p.r.n. beta blocker and aspirin.  Over the past year,  he has had approximately 5-10 episodes of PAF lasting about 24 hours at  a time with conversion to sinus rhythm following a dose of Toprol 25  times one.  Over the past month, however, he has had increasing  frequency of paroxysmal atrial fibrillation and also duration with  episodes lasting about 3 days 3 weeks ago and then 4 days last week,  despite using beta blocker on a daily basis during his atrial  fibrillation.  In general, his symptoms consist of weakness.  Denies any  chest pain or significant dyspnea, although his wife notes  a decrease in  exercise tolerance.  This morning while eating breakfast, he had  recurrent atrial fibrillation, and due to progression of frequency he  called into our office and was advised to present to [facility] ED for  further evaluation.  Currently, he is asymptomatic with rates in the 80s  to 120s and atrial fibrillation.   ALLERGIES:  ANTIHISTAMINES.   HOME MEDICATIONS:  1. Advicor 500/20 mg daily.  2. Norvasc 5 mg daily.  3. Aspirin 81 mg daily.  4. Benicar 20 mA daily.  5. Flomax 0.4 mg daily.  6. Flagyl 0.75 mg.  7. Topical cream p.r.n. rosacea.  8. Prilosec 20 mg daily.  9. Toprol XL 25 mg daily.  10.Vitamin E 4 units daily.   FAMILY HISTORY:  Mother died of cancer and dementia at age 3.  Father  died of colon cancer at age 16, he did have  a history of MI.  He has an  older brother and a younger sister, both of which have had CAD with  CABG.   SOCIAL HISTORY:  Lives in [place] with his wife.  He is married and  does have grown children.  He has a 23 pack-year history of tobacco  abuse quitting in 1989, denies any alcohol or drugs.  He is fairly  active at home working in his wood shop, and also plays golf  twice a  week.   REVIEW OF SYSTEMS:  Positive for irregular heart rate and tachy  palpitations as well as associated weakness.  He did have an episode of  diaphoresis on one day that he went into atrial fibrillation.  Otherwise, all systems reviewed and negative.   PHYSICAL EXAMINATION:  VITAL SIGNS:  Temperature 98.0, heart rate 118,  respirations 16, blood pressure 136/72, pulse ox 96% on room air.  GENERAL:  Pleasant white male in no acute distress.  Awake, alert,  oriented x3.  NECK:  No bruits or JVD.  LUNGS:  Respirations regular and unlabored.  CARDIAC:  Irregular irregular S1, S2.  No S3, S4 or murmurs.  ABDOMEN:  Round, soft, nontender, nondistended.  Bowel sounds present  x4.  EXTREMITIES:  Warm, dry, pink.  No clubbing, cyanosis or edema.   Dorsalis pedis, posterior tibial pulses 2+ and equal bilaterally.  HEENT:  Normal.  NEUROLOGICAL:  Grossly intact and nonfocal.   STUDIES:  Chest x-ray Is pending.  EKG shows atrial fibrillation with a  normal axis, a rate of 126 beats per minute, no acute ST-T changes.  Hemoglobin 15.6, hematocrit 45.9, WBC 5.0, platelets 193.  Sodium 40,  potassium 4.4, chloride 107, CO2 28, BUN 19, creatinine 1.2, glucose  109, total protein 6.6, albumin 3.9, AST 20, ALT 15, calcium 9.2, PT  13.4, INR 1.0, PTT 28, CK 79, MB 1.3, troponin-I 0.02, magnesium 2.1.   ASSESSMENT:  1. Atrial fibrillation with RVR, major complaint is weakness,      otherwise he is asymptomatic.  Rate is 80s to120s.  He has a [place]      score equal to 1  with a history of hypertension, although, he is      approaching 2 secondary to his age.  Previously management with      p.r.n. beta blocker which was successful.  However, he has been      having increase in frequency of PAF and prolonged duration of PAF.      Will plan to add bid beta blocker therapy and titrate to effect as      tolerated.  Will check an echo, TSH.  His magnesium was normal.      Will add heparin for now and further discussions regarding Coumadin      depending upon what his echo shows and how he does.  The patient      would strongly like to avoid Coumadin if at all possible.  2. Hypertension stable.  3. BPH on Flomax.  4. Hyperlipidemia.  He is on Advicor.  He says he recently had lipids,      and that he was told these looked good.  We will check LFTs.      Nicolasa Ducking, ANP      Noralyn Pick. Eden Emms, MD, [facility]  Electronically Signed    CB/MEDQ  D:  03/26/2007  T:  03/27/2007  Job:  [id number]

## 2011-01-07 NOTE — Assessment & Plan Note (Signed)
[facility]                         ELECTROPHYSIOLOGY OFFICE NOTE   NAME:Holder, Steven CHUBA                     MRN:          [id number]  DATE:06/08/2008                            DOB:          1931/10/21    Mr. [name] is seen in followup for atrial fibrillation, which has  persisted despite flecainide previously now Rythmol of 225 one and half  b.i.d. (this was his home dose).  We have had limitations in up  titration of his rate controlling medications because of bradycardia and  the decision is that he has been struggling with is whether to proceed  with atrial fibrillation ablation or backup brady pacing and rate  control.  Given his young age of 49, he would like at this point to  proceed with atrial fibrillation ablation and he will look into where  his insurance company will cover this procedure.   CURRENT MEDICATIONS:  1. Nadolol 10.  2. Propafenone 225 one and half b.i.d.  3. Avodart.  4. Flomax.  5. Prilosec.  6. Aspirin.   PHYSICAL EXAMINATION:  VITAL SIGNS:  His blood pressure today was  130/60, his pulse was 54, his weight was 195 which is stable.  LUNGS:  Clear.  HEART:  Sounds were regular.  EXTREMITIES:  Without edema.  SKIN:  Warm and dry.   Electrocardiogram dated today demonstrated sinus rhythm of 54 with  intervals of 0.19/0.19/0.41, the axis was 70 degrees.   IMPRESSION:  1. Atrial fibrillation - recurrent - paroxysmal.  2. Failure of flecainide and propafenone.  3. Normal left ventricular function.  4. CHADS score of 1 (approaching 2).  We will continue him on his      aspirin.  I anticipate that prior to an ablation procedure he will      end up on Coumadin transiently.  He will let us know whom he would      like to contact.     Duke Salvia, MD, [facility]  Electronically Signed    SCK/MedQ  DD: 06/08/2008  DT: 06/08/2008  Job #: [id number]

## 2011-01-07 NOTE — Procedures (Signed)
[facility]                              EXERCISE TREADMILL   NAME:BILLINGSTyshun, Tuckerman                     MRN:          [id number]  DATE:09/15/2007                            DOB:          06/01/1932    Mr. Rainville was submitted to treadmill testing to assess for  proarrhythmia with recent introduction of flecainide.  He achieved a  maximal heart rate of 96, and his test was then terminated because of  fatigue.  There was no wide complex tachycardia.   IMPRESSION:  1. No evidence of proarrhythmia.  2. Chronotropic incompetence.   We will plan to have him call us in a months' time.  If he has had no  intercurrent atrial fibrillation, we will plan to decrease his nadolol  from 20 mg to 10 mg.   I will see him in 3 months as otherwise scheduled.     Duke Salvia, MD, [facility]  Electronically Signed    SCK/MedQ  DD: 09/15/2007  DT: 09/15/2007  Job #: [id number]

## 2011-01-07 NOTE — Assessment & Plan Note (Signed)
[facility]                         ELECTROPHYSIOLOGY OFFICE NOTE   NAME:Stumpp, BLANDON OFFERDAHL                     MRN:          [id number]  DATE:06/24/2007                            DOB:          [birth year]    The patient is seen again for his paroxysmal atrial fibrillation which  is associated with a rapid ventricular response and hypotension.  We  tried to increase his Coreg and this was poorly tolerated.   He continues to have episodes once or twice a week.   A cardiac evaluation intercurrently has demonstrated a Myoview that was  normal and an echo that was normal.   His other medications currently include Advicor, Flomax, and Benicar 20.   PHYSICAL EXAMINATION:  VITAL SIGNS:  Blood pressure today was mildly  evaluated at 143/80, pulse 57.  LUNGS:  Clear.  HEART:  Sounds were regular.  EXTREMITIES:  Without edema.  SKIN:  Warm and dry.   Electrocardiogram dated today demonstrated a sinus rhythm of 56 with  intervals of 0.17/0.07/0.40.  The axis was 33 degrees.   IMPRESSION:  1. Paroxysmal atrial fibrillation.  2. Hypotension associated with #1.  3. Hypertension at baseline.   I suspect that the patient's hypotension with atrial fibrillation is  again neurally mediated.  However, it still remains quite limiting. He  is not tolerating an increased dose of carvedilol and I suspect that at  the end of the day we are going to need adjunctive therapy with an  antiarrhythmic drug and we will plan to use a 1C.  Prior to that,  however, we will need to find an AV nodal blocking agent that could be  used in conjunction with the above and I have given prescriptions today  for nadolol 20, Toprol 25, verapamil 120, and Inderal LA 60.  I will  plan to see him again in 8 weeks' time and will make a decision at that  point as to which one he tolerates the best and begin adjunctive  antiarrhythmic drug therapy.   In the event that he tolerates one of  these drugs particularly well, he  will call us and we will begin him on Flecainide 100 mg twice daily.  He  will need a treadmill 3-4 days thereafter.  I have reviewed with him and  his wife the potential for arrhythmic issues associated with the above.   They understand and are willing to proceed.     Duke Salvia, MD, [facility]  Electronically Signed    SCK/MedQ  DD: 06/24/2007  DT: 06/25/2007  Job #: [id number]   cc:   Evelena Peat, M.D.

## 2011-01-10 NOTE — Discharge Summary (Signed)
   NAME:  [name], LAWS NO.:  [id number]   MEDICAL RECORD NO.:  [id number]                   PATIENT TYPE:  INP   LOCATION:  2017                                 FACILITY:  [facility]   PHYSICIAN:  [place] Bing, M.D.               DATE OF BIRTH:  10/14/1931   DATE OF ADMISSION:  12/02/2002  DATE OF DISCHARGE:  12/03/2002                           DISCHARGE SUMMARY - REFERRING   DISCHARGE DIAGNOSES:  1. Paroxysmal atrial fibrillation.  2. Gastroesophageal reflux disease.  3. Hyperlipidemia, treated.   HISTORY:  The patient is a 77 year old male patient who had self limiting  episode of atrial fibrillation about ten years prior to this admission.  He  was admitted on 12/02/2002 with a recurrence.  He has no known structural  heart disease.  On the day of admission his palpitations persisted since  about 1 p.m. associated with minor chest tightness.  He was seen by his  primary medical doctor and then referred to the emergency room for  admission.  He remained in the hospital overnight and converted to a normal  sinus rhythm after a dose of  __________  .  After further discussion with  the patient, he notes several episodes lasting less than 10 minutes over the  past several years but this averages less than once a month.  After his  conversion to a normal sinus rhythm it was felt that he would be safe for  discharge to home.   DISCHARGE MEDICATIONS:  1. Discharged on Toprol XL 25 mg a day in addition to his other medications     which include:  2. Prevacid.  3. Digoxin.  4. Advicor 20/500 mg once a day.   DISPOSITION:  Arrangements were made for the patient to follow up in the  office and he was to be contacted so that a 2D echo as well as stress  Cardiolite could be arranged as an outpatient.  He was discharged to home in  stable condition.      Guy Franco, P.A. [facility]                      Cantril Bing, M.D.    [name]  D:  12/28/2002  T:   12/28/2002  Job:  [id number]   cc:   Evelena Peat, M.D.  [address]  [place]  [place] [address]  Fax: [phone]   [facility]

## 2011-03-14 ENCOUNTER — Ambulatory Visit (INDEPENDENT_AMBULATORY_CARE_PROVIDER_SITE_OTHER): Payer: Medicare Other | Admitting: Family Medicine

## 2011-03-14 ENCOUNTER — Encounter: Payer: Self-pay | Admitting: Family Medicine

## 2011-03-14 DIAGNOSIS — I4891 Unspecified atrial fibrillation: Secondary | ICD-10-CM

## 2011-03-14 DIAGNOSIS — I10 Essential (primary) hypertension: Secondary | ICD-10-CM

## 2011-03-14 DIAGNOSIS — E785 Hyperlipidemia, unspecified: Secondary | ICD-10-CM

## 2011-03-14 MED ORDER — NADOLOL 20 MG PO TABS: 20.0000 mg | ORAL_TABLET | Freq: Every day | ORAL | Status: DC

## 2011-03-14 MED ORDER — LISINOPRIL 10 MG PO TABS: 10.0000 mg | ORAL_TABLET | Freq: Every day | ORAL | Status: DC

## 2011-03-14 NOTE — Progress Notes (Signed)
  Subjective:    Patient ID: [name], male    DOB: Mar 11, 1932, 77 y.o.   MRN: [id number]  HPI Patient here for medical followup. He has history of atrial fibrillation with prior ablation procedure, hypertension, GERD, rosacea, and history of squamous cell cancer skin. Has not seen dermatologist in quite some time. Recent blood pressures reviewed. Frequent readings of 140-150 systolic. No headaches or dizziness. Remains on nadolol 20 mg daily. Previously had been on combination blood pressure medication. Denies any recent chest pains.  Some occasional fleeting left low back pains recently. No injury. No radiculopathy symptoms. Symptoms are mild. Patient queries whether this is related to cholesterol medication.   Review of Systems  Constitutional: Negative for fever, chills and unexpected weight change.  Respiratory: Negative for cough, shortness of breath and wheezing.   Cardiovascular: Negative for chest pain, palpitations and leg swelling.  Gastrointestinal: Negative for abdominal pain.  Musculoskeletal: Positive for back pain. Negative for myalgias and arthralgias.       Objective:   Physical Exam  Constitutional: He is oriented to person, place, and time. He appears well-developed and well-nourished. No distress.  Neck: Neck supple. No thyromegaly present.  Cardiovascular: Normal rate and regular rhythm.   Pulmonary/Chest: Effort normal and breath sounds normal. No respiratory distress. He has no wheezes. He has no rales.  Musculoskeletal: He exhibits no edema.  Lymphadenopathy:    He has no cervical adenopathy.  Neurological: He is alert and oriented to person, place, and time.  Skin:       Multiple hyperkeratotic lesions face and upper extremities.          Assessment & Plan:  #1 hypertension suboptimal control. Add lisinopril 10 mg a day and refill nadolol. Reassess blood pressure one month #2 hyperlipidemia. Reassess lipids at followup fasting in one month  #3  history of atrial fibrillation currently sinus rhythm -prior ablation. #4 history squamous cell cancer of skin-multiple hyperkeratotic lesions UEs and face and suspect some AKs and recommend continued f/u with dermatologist.

## 2011-03-14 NOTE — Patient Instructions (Signed)
Continue to monitor blood pressure at home and bring readings with you at next visit.

## 2011-04-11 ENCOUNTER — Ambulatory Visit: Payer: PRIVATE HEALTH INSURANCE | Admitting: Family Medicine

## 2011-04-15 ENCOUNTER — Ambulatory Visit: Payer: Self-pay | Admitting: Family Medicine

## 2011-04-15 ENCOUNTER — Ambulatory Visit (INDEPENDENT_AMBULATORY_CARE_PROVIDER_SITE_OTHER): Payer: Medicare Other | Admitting: Family Medicine

## 2011-04-15 ENCOUNTER — Encounter: Payer: Self-pay | Admitting: Family Medicine

## 2011-04-15 VITALS — BP 130/70 | Temp 97.7°F | Wt 188.0 lb

## 2011-04-15 DIAGNOSIS — I10 Essential (primary) hypertension: Secondary | ICD-10-CM

## 2011-04-15 DIAGNOSIS — I4891 Unspecified atrial fibrillation: Secondary | ICD-10-CM

## 2011-04-15 DIAGNOSIS — E785 Hyperlipidemia, unspecified: Secondary | ICD-10-CM

## 2011-04-15 LAB — LIPID PANEL
Cholesterol: 123 mg/dL (ref 0–200)
HDL: 31.1 mg/dL — ABNORMAL LOW (ref >39.00)
LDL Cholesterol: 58 mg/dL (ref 0–99)
Total CHOL/HDL Ratio: 4
VLDL: 34 mg/dL (ref 0.0–40.0)

## 2011-04-15 LAB — BASIC METABOLIC PANEL
BUN: 30 mg/dL — ABNORMAL HIGH (ref 6–23)
Calcium: 9.2 mg/dL (ref 8.4–10.5)
Chloride: 105 mEq/L (ref 96–112)
Creatinine, Ser: 1.3 mg/dL (ref 0.4–1.5)
GFR: 59.5 mL/min — ABNORMAL LOW (ref >60.00)

## 2011-04-15 LAB — HEPATIC FUNCTION PANEL
ALT: 15 U/L (ref 0–53)
Total Bilirubin: 1 mg/dL (ref 0.3–1.2)

## 2011-04-15 NOTE — Progress Notes (Signed)
  Subjective:    Patient ID: [name], male    DOB: [birth year], 77 y.o.   MRN: [id number]  HPI Patient seen for medical followup. Recent elevated blood pressure. Added lisinopril 10 mg daily. No cough or other side effect. Blood pressures have improved home readings mostly 120-130 systolic. No dizziness.  Hyperlipidemia treated with lovastatin 20 mg daily. Needs followup lab work. Some intermittent low back pain but no diffuse myalgias. History of atrial fibrillation. Previous ablation therapy. Denies recent palpitations. Heart rhythm regular. No chest pain.  Past Medical History  Diagnosis Date  . CARCINOMA, SKIN, SQUAMOUS CELL 09/11/2009  . HYPERLIPIDEMIA 03/13/2009  . INSOMNIA, CHRONIC 09/11/2009  . HYPERTENSION 03/13/2009  . Atrial fibrillation 04/07/2009  . PULMONARY NODULE 09/11/2009  . GERD 03/13/2009  . Rosacea 03/13/2009  . Actinic keratosis 03/13/2009  . SKIN CANCER, HX OF 03/13/2009  . COLONIC POLYPS, HX OF 03/13/2009  . BENIGN PROSTATIC HYPERTROPHY, HX OF 04/07/2009  . POLYPECTOMY, HX OF 04/07/2009   Past Surgical History  Procedure Date  . Lumbar disc surgery     RUPTURE  . Polypectomy     reports that he quit smoking about 23 years ago. His smoking use included Cigarettes. He has a 30 pack-year smoking history. He does not have any smokeless tobacco history on file. His alcohol and drug histories not on file. family history includes Cancer in his mother; Cancer (age of onset:84) in his father; and Heart disease in his brother and sister. Allergies  Allergen Reactions  . WUJ:[id number] Acid+Aspartame     REACTION: GI upset, diarrhea      Review of Systems  Constitutional: Negative for fatigue.  Eyes: Negative for visual disturbance.  Respiratory: Negative for cough, chest tightness and shortness of breath.   Cardiovascular: Negative for chest pain, palpitations and leg swelling.  Neurological: Negative for dizziness, syncope, weakness,  light-headedness and headaches.       Objective:   Physical Exam  Constitutional: He is oriented to person, place, and time. He appears well-developed and well-nourished.  Neck: Neck supple.  Cardiovascular: Normal rate, regular rhythm and normal heart sounds.  Exam reveals no gallop.   Pulmonary/Chest: Effort normal and breath sounds normal. No respiratory distress. He has no wheezes. He has no rales.  Musculoskeletal: He exhibits no edema.  Lymphadenopathy:    He has no cervical adenopathy.  Neurological: He is alert and oriented to person, place, and time.          Assessment & Plan:  #1 hypertension improved. Continue current medication. Check basic metabolic panel with recent initiation of ACE inhibitor #2 hyperlipidemia. Check lipids and hepatic panel. Routine followup 6 months. #3 health maintenance. Reminder for flu vaccine this fall #4 Hx atrial fibrillation s/p ablation currently NSR.

## 2011-04-16 NOTE — Progress Notes (Signed)
Quick Note:  Pt informed on home VM ______ 

## 2011-04-24 ENCOUNTER — Telehealth: Payer: Self-pay | Admitting: *Deleted

## 2011-04-24 MED ORDER — LOSARTAN POTASSIUM 50 MG PO TABS: 50.0000 mg | ORAL_TABLET | Freq: Every day | ORAL | Status: DC

## 2011-04-24 NOTE — Telephone Encounter (Signed)
D/c lisinopril and start Losartan 50 mg daily and return within 3-4 weeks to reassess.

## 2011-04-24 NOTE — Telephone Encounter (Addendum)
Wife thinks the Lisinopril is causing diarrhea and wants an alternative called to [facility].  30 day supply.

## 2011-06-09 LAB — COMPREHENSIVE METABOLIC PANEL
AST: 15
Albumin: 4
Alkaline Phosphatase: 84
BUN: 18
BUN: 19
CO2: 28
Calcium: 9.3
Chloride: 102
Chloride: 107
Creatinine, Ser: 1.38
GFR calc Af Amer: >60
Glucose, Bld: 109 — ABNORMAL HIGH
Potassium: 4.4
Total Bilirubin: 0.8
Total Bilirubin: 1.1
Total Protein: 6.5

## 2011-06-09 LAB — LIPID PANEL
LDL Cholesterol: 90
Triglycerides: 117

## 2011-06-09 LAB — CARDIAC PANEL(CRET KIN+CKTOT+MB+TROPI)
CK, MB: 1.2
CK, MB: 1.3
Total CK: 64
Total CK: 70
Troponin I: 0.01
Troponin I: 0.01

## 2011-06-09 LAB — HEPARIN LEVEL (UNFRACTIONATED)
Heparin Unfractionated: 0.74 — ABNORMAL HIGH
Heparin Unfractionated: 1.32 — ABNORMAL HIGH

## 2011-06-09 LAB — PROTIME-INR
INR: 1
INR: 1.1
INR: 1.1
Prothrombin Time: 13.4
Prothrombin Time: 14

## 2011-06-09 LAB — APTT: aPTT: 189 — ABNORMAL HIGH

## 2011-06-09 LAB — CBC
HCT: 43.2
HCT: 45.9
Hemoglobin: 14.8
MCV: 98.1
MCV: 98.6
Platelets: 154
RBC: 4.68
WBC: 5
WBC: 5.3

## 2011-06-09 LAB — DIFFERENTIAL
Basophils Absolute: 0
Basophils Relative: 0
Eosinophils Absolute: 0.3
Neutro Abs: 2.9
Neutrophils Relative %: 58

## 2011-06-09 LAB — CK TOTAL AND CKMB (NOT AT ARMC): Total CK: 79

## 2011-08-05 ENCOUNTER — Telehealth: Payer: Self-pay

## 2011-08-05 NOTE — Telephone Encounter (Signed)
Pt is having higher blood pressure readings.  The reading this morning was 171/68 pulse 68. Offered pt appt with another physician but he declined.  Pt has an appt for 08/06/11 at 11:45.

## 2011-08-05 NOTE — Telephone Encounter (Signed)
OK to wait until then

## 2011-08-06 ENCOUNTER — Ambulatory Visit (INDEPENDENT_AMBULATORY_CARE_PROVIDER_SITE_OTHER): Payer: Medicare Other | Admitting: Family Medicine

## 2011-08-06 ENCOUNTER — Encounter: Payer: Self-pay | Admitting: Family Medicine

## 2011-08-06 VITALS — BP 170/80 | Temp 98.0°F | Wt 195.0 lb

## 2011-08-06 DIAGNOSIS — Z23 Encounter for immunization: Secondary | ICD-10-CM

## 2011-08-06 DIAGNOSIS — I10 Essential (primary) hypertension: Secondary | ICD-10-CM

## 2011-08-06 MED ORDER — LOSARTAN POTASSIUM-HCTZ 100-25 MG PO TABS: 1.0000 | ORAL_TABLET | Freq: Every day | ORAL | Status: DC

## 2011-08-06 NOTE — Progress Notes (Signed)
  Subjective:    Patient ID: [name], male    DOB: June 29, 1932, 77 y.o.   MRN: [id number]  HPI  Acute visit for elevated blood pressure. Started increasing recently over the past one or 2 weeks. Generally controlled in the past. Currently takes losartan 50 mg daily and Corgard 20 mg daily. Had blood pressure 187/89 last night and this morning 171/91. Has had some mild ankle edema past few days and increased weight gain. No dyspnea. No orthopnea. Denies any headaches. Generally does not feel well. No chest pain. No dizziness.  Prior history of atrial fibrillation status post ablation procedure with no recurrence.   Review of Systems  Constitutional: Negative for fatigue.  Eyes: Negative for visual disturbance.  Respiratory: Negative for cough, chest tightness and shortness of breath.   Cardiovascular: Negative for chest pain, palpitations and leg swelling.  Gastrointestinal: Negative for abdominal pain.  Genitourinary: Negative for dysuria.  Neurological: Negative for dizziness, syncope, weakness, light-headedness and headaches.       Objective:   Physical Exam  Constitutional: He is oriented to person, place, and time. He appears well-developed and well-nourished.  Eyes: Pupils are equal, round, and reactive to light.  Neck: Neck supple. No thyromegaly present.  Cardiovascular: Normal rate and regular rhythm.   Pulmonary/Chest: Effort normal and breath sounds normal. No respiratory distress. He has no wheezes. He has no rales.  Musculoskeletal: He exhibits edema.       Patient has trace to 1+ pitting edema ankles and feet bilaterally  Neurological: He is alert and oriented to person, place, and time.          Assessment & Plan:  Hypertension with exacerbation. Change losartan to losartan HCTZ 100/25 one daily. Reassess blood pressure one week. He does have some increased edema today which is relatively mild. Reassess in one week. Basic metabolic panel then if not  significant improved.  No evidence for CHF.

## 2011-08-15 ENCOUNTER — Ambulatory Visit (INDEPENDENT_AMBULATORY_CARE_PROVIDER_SITE_OTHER): Payer: Medicare Other | Admitting: Family Medicine

## 2011-08-15 ENCOUNTER — Encounter: Payer: Self-pay | Admitting: Family Medicine

## 2011-08-15 VITALS — BP 120/68 | Temp 97.8°F | Wt 183.0 lb

## 2011-08-15 DIAGNOSIS — I10 Essential (primary) hypertension: Secondary | ICD-10-CM

## 2011-08-15 DIAGNOSIS — L719 Rosacea, unspecified: Secondary | ICD-10-CM

## 2011-08-15 DIAGNOSIS — N400 Enlarged prostate without lower urinary tract symptoms: Secondary | ICD-10-CM

## 2011-08-15 DIAGNOSIS — R300 Dysuria: Secondary | ICD-10-CM

## 2011-08-15 LAB — POCT URINALYSIS DIPSTICK
Blood, UA: NEGATIVE
Glucose, UA: NEGATIVE
Leukocytes, UA: NEGATIVE
Nitrite, UA: NEGATIVE
Urobilinogen, UA: 0.2

## 2011-08-15 LAB — BASIC METABOLIC PANEL
Calcium: 9.4 mg/dL (ref 8.4–10.5)
GFR: 51.32 mL/min — ABNORMAL LOW (ref >60.00)
Potassium: 4 mEq/L (ref 3.5–5.1)
Sodium: 138 mEq/L (ref 135–145)

## 2011-08-15 MED ORDER — METRONIDAZOLE 0.75 % EX CREA: TOPICAL_CREAM | Freq: Two times a day (BID) | CUTANEOUS | Status: DC

## 2011-08-15 MED ORDER — TAMSULOSIN HCL 0.4 MG PO CAPS: 0.4000 mg | ORAL_CAPSULE | Freq: Every day | ORAL | Status: DC

## 2011-08-15 NOTE — Progress Notes (Signed)
  Subjective:    Patient ID: [name], male    DOB: August 14, 1932, 77 y.o.   MRN: [id number]  HPI  Patient seen for several issues as follows  Hypertension with recent poor control. Recent edema and elevated blood pressures. We changed his losartan to losartan HCTZ. Weight down 12 pounds and blood pressure much improved. Controlled by home readings. No orthostasis. Past history of atrial perforation with previous ablation procedure and maintained in sinus rhythm  History of rosacea. Requesting refills of metronidazole cream. Symptoms stable.  Dysuria for several weeks. Some frequency also occasional burning with urination. He has slow stream and occasional nocturia. Frequently unable to empty bladder. Previous intolerance to Flomax with question of palpitations   Review of Systems  Constitutional: Negative for fever, appetite change and fatigue.  Respiratory: Negative for cough and shortness of breath.   Cardiovascular: Negative for chest pain, palpitations and leg swelling.  Gastrointestinal: Negative for abdominal pain.  Genitourinary: Positive for dysuria, urgency, frequency and decreased urine volume. Negative for hematuria.  Neurological: Negative for dizziness and syncope.       Objective:   Physical Exam  Constitutional: He appears well-developed and well-nourished. No distress.  HENT:  Mouth/Throat: Oropharynx is clear and moist.  Cardiovascular: Normal rate and regular rhythm.   Pulmonary/Chest: Effort normal and breath sounds normal. No respiratory distress. He has no wheezes. He has no rales.  Musculoskeletal: He exhibits no edema.          Assessment & Plan:  #1 hypertension greatly improved. Continue current medications. Check basic metabolic panel with recent addition of HCTZ #2 history of rosacea. Refill metronidazole cream  #3 dysuria. Probable BPH. Rule out infection. Check urinalysis. If clear consider other options such as finasteride or Avodart. ?  Palpitations previously with Flomax but he was in atrial fibrillation at the time. He is willing to start repeat trial  Urinalysis unremarkable. Start Flomax 0.4 mg one daily

## 2011-08-18 NOTE — Progress Notes (Signed)
Quick Note:  Pt wife informed ______ 

## 2011-10-16 ENCOUNTER — Encounter: Payer: Self-pay | Admitting: Family Medicine

## 2011-10-16 ENCOUNTER — Ambulatory Visit (INDEPENDENT_AMBULATORY_CARE_PROVIDER_SITE_OTHER): Payer: Medicare Other | Admitting: Family Medicine

## 2011-10-16 DIAGNOSIS — G4700 Insomnia, unspecified: Secondary | ICD-10-CM

## 2011-10-16 DIAGNOSIS — R7309 Other abnormal glucose: Secondary | ICD-10-CM

## 2011-10-16 DIAGNOSIS — Z87898 Personal history of other specified conditions: Secondary | ICD-10-CM | POA: Diagnosis not present

## 2011-10-16 DIAGNOSIS — I10 Essential (primary) hypertension: Secondary | ICD-10-CM

## 2011-10-16 DIAGNOSIS — I4891 Unspecified atrial fibrillation: Secondary | ICD-10-CM | POA: Diagnosis not present

## 2011-10-16 DIAGNOSIS — E785 Hyperlipidemia, unspecified: Secondary | ICD-10-CM | POA: Diagnosis not present

## 2011-10-16 DIAGNOSIS — Z299 Encounter for prophylactic measures, unspecified: Secondary | ICD-10-CM

## 2011-10-16 DIAGNOSIS — R7303 Prediabetes: Secondary | ICD-10-CM

## 2011-10-16 MED ORDER — LOVASTATIN 20 MG PO TABS: 20.0000 mg | ORAL_TABLET | Freq: Every day | ORAL | Status: DC

## 2011-10-16 MED ORDER — FLUOROURACIL 5 % EX CREA: TOPICAL_CREAM | Freq: Two times a day (BID) | CUTANEOUS | Status: DC

## 2011-10-16 MED ORDER — PNEUMOCOCCAL VAC POLYVALENT 25 MCG/0.5ML IJ INJ: 0.5000 mL | INJECTION | Freq: Once | INTRAMUSCULAR | Status: DC

## 2011-10-16 MED ORDER — NADOLOL 20 MG PO TABS: 20.0000 mg | ORAL_TABLET | Freq: Every day | ORAL | Status: DC

## 2011-10-16 MED ORDER — TAMSULOSIN HCL 0.4 MG PO CAPS: 0.4000 mg | ORAL_CAPSULE | Freq: Every day | ORAL | Status: DC

## 2011-10-16 MED ORDER — ALPRAZOLAM 0.25 MG PO TABS: 0.2500 mg | ORAL_TABLET | Freq: Every evening | ORAL | Status: DC | PRN

## 2011-10-16 MED ORDER — LOSARTAN POTASSIUM-HCTZ 100-25 MG PO TABS: 1.0000 | ORAL_TABLET | Freq: Every day | ORAL | Status: DC

## 2011-10-16 NOTE — Patient Instructions (Signed)

## 2011-10-16 NOTE — Progress Notes (Signed)
  Subjective:    Patient ID: [name], male    DOB: [birth year], 78 y.o.   MRN: [id number]  HPI  Medical followup. Patient has history of hypertension, past history of atrial fibrillation with previous ablation procedure, hyperlipidemia, BPH, and chronic insomnia. Medications reviewed. Takes alprazolam only rarely for insomnia. Sleep hygiene discussed. No unsteady gait.  Low risk for falls.  Blood pressures from home reviewed. Mostly well controlled. Occasional lightheadedness with standing. Does not consume adequate fluids frequently. Had one single episode recently of vertigo. None since then. No syncope history.  Prediabetes by previous labs. No symptoms of hyperglycemia. High sugar and starch intake at times.  BPH treated with Flomax. He thinks is causing some lightheadedness. Generally this has improved his urinary symptoms. No major obstructive symptoms. Usually nocturia only about once.  Past Medical History  Diagnosis Date  . CARCINOMA, SKIN, SQUAMOUS CELL 09/11/2009  . HYPERLIPIDEMIA 03/13/2009  . INSOMNIA, CHRONIC 09/11/2009  . HYPERTENSION 03/13/2009  . Atrial fibrillation 04/07/2009  . PULMONARY NODULE 09/11/2009  . GERD 03/13/2009  . Rosacea 03/13/2009  . Actinic keratosis 03/13/2009  . SKIN CANCER, HX OF 03/13/2009  . COLONIC POLYPS, HX OF 03/13/2009  . BENIGN PROSTATIC HYPERTROPHY, HX OF 04/07/2009  . POLYPECTOMY, HX OF 04/07/2009   Past Surgical History  Procedure Date  . Lumbar disc surgery     RUPTURE  . Polypectomy     reports that he quit smoking about 23 years ago. His smoking use included Cigarettes. He has a 30 pack-year smoking history. He does not have any smokeless tobacco history on file. His alcohol and drug histories not on file. family history includes Cancer in his mother; Cancer (age of onset:84) in his father; and Heart disease in his brother and sister. Allergies  Allergen Reactions  . WUJ:[id number]+AOZHYQMVH+QIONGEXBMW Acid+Aspartame     REACTION:  GI upset, diarrhea, amoxicillin      Review of Systems  Constitutional: Negative for fatigue.  Eyes: Negative for visual disturbance.  Respiratory: Negative for cough, chest tightness and shortness of breath.   Cardiovascular: Negative for chest pain, palpitations and leg swelling.  Genitourinary: Negative for dysuria and frequency.  Neurological: Positive for dizziness. Negative for syncope, weakness, light-headedness and headaches.       Objective:   Physical Exam  Constitutional: He is oriented to person, place, and time. He appears well-developed and well-nourished.  HENT:  Mouth/Throat: Oropharynx is clear and moist.  Neck: Neck supple. No thyromegaly present.  Cardiovascular: Normal rate, regular rhythm and normal heart sounds.   Pulmonary/Chest: Effort normal. No respiratory distress. He has no wheezes. He has no rales.  Musculoskeletal: He exhibits no edema.  Neurological: He is alert and oriented to person, place, and time. No cranial nerve deficit.  Psychiatric: He has a normal mood and affect. His behavior is normal.          Assessment & Plan:  #1  prediabetes. Exercise and diet discussed. Followup fasting blood sugar in 6 months #2 hyperlipidemia. Recent lipids LDL at goal. He has chronic low HDL. Refill lovastatin for one year   #3 chronic insomnia. Refill xanax to use sparingly. Sleep hygiene discussed with handout given  #4 hypertension stable, refill medications for one year  #5 BPH. Possible dizziness related to Flomax. Discussed other options such as Avodart at this point he wishes to try reduce Flomax every other day

## 2011-10-23 DIAGNOSIS — L570 Actinic keratosis: Secondary | ICD-10-CM | POA: Diagnosis not present

## 2011-10-23 DIAGNOSIS — L821 Other seborrheic keratosis: Secondary | ICD-10-CM | POA: Diagnosis not present

## 2011-10-23 DIAGNOSIS — Z85828 Personal history of other malignant neoplasm of skin: Secondary | ICD-10-CM | POA: Diagnosis not present

## 2011-10-23 DIAGNOSIS — C4330 Malignant melanoma of unspecified part of face: Secondary | ICD-10-CM | POA: Diagnosis not present

## 2011-10-23 DIAGNOSIS — D485 Neoplasm of uncertain behavior of skin: Secondary | ICD-10-CM | POA: Diagnosis not present

## 2011-11-10 DIAGNOSIS — R238 Other skin changes: Secondary | ICD-10-CM | POA: Diagnosis not present

## 2011-11-10 DIAGNOSIS — C4330 Malignant melanoma of unspecified part of face: Secondary | ICD-10-CM | POA: Diagnosis not present

## 2011-11-10 DIAGNOSIS — C4499 Other specified malignant neoplasm of skin, unspecified: Secondary | ICD-10-CM | POA: Diagnosis not present

## 2011-12-22 ENCOUNTER — Ambulatory Visit (INDEPENDENT_AMBULATORY_CARE_PROVIDER_SITE_OTHER): Payer: Medicare Other | Admitting: Family Medicine

## 2011-12-22 ENCOUNTER — Encounter: Payer: Self-pay | Admitting: Family Medicine

## 2011-12-22 VITALS — BP 105/50 | Temp 97.4°F | Wt 184.0 lb

## 2011-12-22 DIAGNOSIS — H9222 Otorrhagia, left ear: Secondary | ICD-10-CM

## 2011-12-22 DIAGNOSIS — I951 Orthostatic hypotension: Secondary | ICD-10-CM

## 2011-12-22 DIAGNOSIS — Z87898 Personal history of other specified conditions: Secondary | ICD-10-CM | POA: Diagnosis not present

## 2011-12-22 DIAGNOSIS — I10 Essential (primary) hypertension: Secondary | ICD-10-CM | POA: Diagnosis not present

## 2011-12-22 MED ORDER — FINASTERIDE 5 MG PO TABS: 5.0000 mg | ORAL_TABLET | Freq: Every day | ORAL | Status: DC

## 2011-12-22 NOTE — Progress Notes (Signed)
Subjective:    Patient ID: [name], male    DOB: [birth year], 78 y.o.   MRN: [id number]  HPI  Patient seen with complaints of intermittent dizziness over the past several weeks. On a couple of occasions earlier this month he had blood pressure readings of 71/44 and 81/44 after playing golf and felt very dizzy. Good fluid intake. Takes several medications which can affect blood pressure including losartan HCTZ, nadolol, and Flomax.  He held Flomax for a couple of days. However, he had breakthrough BPH symptoms with slow stream. We have previously discussed options such as finasteride. He has not had any recent nausea, vomiting, or diarrhea. Eating well. Appetite fair. No chest pains. No palpitations.  Past history of atrial fibrillation with previous ablation procedure. He is not aware of any heart irregularity. No headaches. No focal weakness. No history of syncope.  Recently noted a small amount of bright blood left external ear. No injury. No pain. No hearing changes. No history of trauma  Past Medical History  Diagnosis Date  . CARCINOMA, SKIN, SQUAMOUS CELL 09/11/2009  . HYPERLIPIDEMIA 03/13/2009  . INSOMNIA, CHRONIC 09/11/2009  . HYPERTENSION 03/13/2009  . Atrial fibrillation 04/07/2009  . PULMONARY NODULE 09/11/2009  . GERD 03/13/2009  . Rosacea 03/13/2009  . Actinic keratosis 03/13/2009  . SKIN CANCER, HX OF 03/13/2009  . COLONIC POLYPS, HX OF 03/13/2009  . BENIGN PROSTATIC HYPERTROPHY, HX OF 04/07/2009  . POLYPECTOMY, HX OF 04/07/2009   Past Surgical History  Procedure Date  . Lumbar disc surgery     RUPTURE  . Polypectomy     reports that he quit smoking about 23 years ago. His smoking use included Cigarettes. He has a 30 pack-year smoking history. He does not have any smokeless tobacco history on file. His alcohol and drug histories not on file. family history includes Cancer in his mother; Cancer (age of onset:84) in his father; and Heart disease in his brother and  sister. Allergies  Allergen Reactions  . Amoxicillin-Pot Clavulanate     REACTION: GI upset, diarrhea, amoxicillin      Review of Systems  Constitutional: Negative for fever, chills, diaphoresis, appetite change and unexpected weight change.  HENT: Negative for hearing loss and ear pain.   Respiratory: Negative for cough and shortness of breath.   Cardiovascular: Negative for chest pain, palpitations and leg swelling.  Gastrointestinal: Negative for abdominal pain and blood in stool.  Genitourinary: Negative for dysuria.  Neurological: Positive for dizziness. Negative for seizures, syncope, weakness and headaches.       Objective:   Physical Exam  Constitutional: He is oriented to person, place, and time. He appears well-developed and well-nourished. No distress.  HENT:  Mouth/Throat: Oropharynx is clear and moist.       Left ear canal reveals minimal bright blood external canal. No active bleeding. Eardrum no acute change  Eyes: Pupils are equal, round, and reactive to light.  Neck: Neck supple.  Cardiovascular: Normal rate and regular rhythm.   Pulmonary/Chest: Effort normal and breath sounds normal. No respiratory distress. He has no wheezes. He has no rales.  Abdominal: Soft. There is no tenderness.  Musculoskeletal: He exhibits no edema.  Lymphadenopathy:    He has no cervical adenopathy.  Neurological: He is alert and oriented to person, place, and time. No cranial nerve deficit.          Assessment & Plan:  Orthostatic dizziness. Patient appears to be well hydrated. No history of anemia. Reduce losartan HCTZ 100/25  mg to one half tablet daily. Reassess blood pressure within one month. Drink plenty of fluids. Change positions slowly.  BPH. Start finasteride 5 mg once daily. Hopefully can taper off Flomax eventually but he is aware this may take several months to see significant affect.  Bleeding from left ear. Confined to the external canal. Probably inadvertently  scratch. No worrisome lesions noted. Reassurance.

## 2011-12-22 NOTE — Patient Instructions (Signed)
Drink plenty of fluids Change positions such as getting up slowly.

## 2012-01-22 ENCOUNTER — Ambulatory Visit (INDEPENDENT_AMBULATORY_CARE_PROVIDER_SITE_OTHER): Payer: Medicare Other | Admitting: Family Medicine

## 2012-01-22 ENCOUNTER — Encounter: Payer: Self-pay | Admitting: Family Medicine

## 2012-01-22 VITALS — BP 106/54 | HR 60 | Temp 98.0°F | Resp 14 | Wt 182.0 lb

## 2012-01-22 DIAGNOSIS — Z87898 Personal history of other specified conditions: Secondary | ICD-10-CM | POA: Diagnosis not present

## 2012-01-22 DIAGNOSIS — I10 Essential (primary) hypertension: Secondary | ICD-10-CM | POA: Diagnosis not present

## 2012-01-22 MED ORDER — LOSARTAN POTASSIUM 50 MG PO TABS: 50.0000 mg | ORAL_TABLET | Freq: Every day | ORAL | Status: DC

## 2012-01-22 NOTE — Progress Notes (Signed)
  Subjective:    Patient ID: [name], male    DOB: 1932-07-17, 78 y.o.   MRN: [id number]  HPI  Patient presented with recently some dizziness and orthostasis. We reduced his losartan HCTZ from 100/25 to 1/2 tablet daily. His dizziness has greatly improved but home readings he is still getting significant orthostatic drop at times. Low blood pressures around 88 systolic. Frequently getting 10-15 point drop in systolic. Symptomatically improved though. No syncope. No chest pains. No dyspnea. History of atrial fibrillation with previous ablation. No palpitations.  Past Medical History  Diagnosis Date  . CARCINOMA, SKIN, SQUAMOUS CELL 09/11/2009  . HYPERLIPIDEMIA 03/13/2009  . INSOMNIA, CHRONIC 09/11/2009  . HYPERTENSION 03/13/2009  . Atrial fibrillation 04/07/2009  . PULMONARY NODULE 09/11/2009  . GERD 03/13/2009  . Rosacea 03/13/2009  . Actinic keratosis 03/13/2009  . SKIN CANCER, HX OF 03/13/2009  . COLONIC POLYPS, HX OF 03/13/2009  . BENIGN PROSTATIC HYPERTROPHY, HX OF 04/07/2009  . POLYPECTOMY, HX OF 04/07/2009   Past Surgical History  Procedure Date  . Lumbar disc surgery     RUPTURE  . Polypectomy     reports that he quit smoking about 23 years ago. His smoking use included Cigarettes. He has a 30 pack-year smoking history. He does not have any smokeless tobacco history on file. His alcohol and drug histories not on file. family history includes Cancer in his mother; Cancer (age of onset:84) in his father; and Heart disease in his brother and sister. Allergies  Allergen Reactions  . Amoxicillin-Pot Clavulanate     REACTION: GI upset, diarrhea, amoxicillin      Review of Systems  Respiratory: Negative for cough and shortness of breath.   Cardiovascular: Negative for chest pain.  Neurological: Negative for dizziness.       Objective:   Physical Exam  Constitutional: He is oriented to person, place, and time. He appears well-developed and well-nourished. No distress.    HENT:  Mouth/Throat: Oropharynx is clear and moist.  Neck: Neck supple. No thyromegaly present.  Cardiovascular: Normal rate and regular rhythm.   Pulmonary/Chest: Effort normal and breath sounds normal. No respiratory distress. He has no wheezes. He has no rales.  Musculoskeletal: He exhibits no edema.  Neurological: He is alert and oriented to person, place, and time.          Assessment & Plan:  #1 hypertension. Recent orthostasis improved with reduction in medication. Still has low systolic blood pressure especially with standing. We'll discontinue HCTZ and transition to losartan 50 mg daily. #2 BPH. Continue finasteride. Consider discontinue Flomax in 3 months if symptomatically improved

## 2012-04-15 ENCOUNTER — Encounter: Payer: Self-pay | Admitting: Family Medicine

## 2012-04-15 ENCOUNTER — Ambulatory Visit (INDEPENDENT_AMBULATORY_CARE_PROVIDER_SITE_OTHER): Payer: Medicare Other | Admitting: Family Medicine

## 2012-04-15 VITALS — BP 110/64 | Temp 98.0°F | Wt 183.0 lb

## 2012-04-15 DIAGNOSIS — Z87898 Personal history of other specified conditions: Secondary | ICD-10-CM | POA: Diagnosis not present

## 2012-04-15 DIAGNOSIS — D485 Neoplasm of uncertain behavior of skin: Secondary | ICD-10-CM | POA: Diagnosis not present

## 2012-04-15 DIAGNOSIS — L719 Rosacea, unspecified: Secondary | ICD-10-CM | POA: Diagnosis not present

## 2012-04-15 DIAGNOSIS — I10 Essential (primary) hypertension: Secondary | ICD-10-CM | POA: Diagnosis not present

## 2012-04-15 DIAGNOSIS — Z85820 Personal history of malignant melanoma of skin: Secondary | ICD-10-CM | POA: Diagnosis not present

## 2012-04-15 DIAGNOSIS — Z85828 Personal history of other malignant neoplasm of skin: Secondary | ICD-10-CM | POA: Diagnosis not present

## 2012-04-15 DIAGNOSIS — L570 Actinic keratosis: Secondary | ICD-10-CM | POA: Diagnosis not present

## 2012-04-15 NOTE — Progress Notes (Signed)
  Subjective:    Patient ID: [name], male    DOB: 1931/12/15, 78 y.o.   MRN: [id number]  HPI  Followup hypertension. Refer to prior note. He had some orthostasis. We transitioned from losartan HCTZ to plain losartan 50 mg daily. Blood pressures generally well-controlled. Only one episode of orthostasis after playing golf for several hours. Pressures mostly around 130-140 range systolic. No headaches. No chest pains. No dyspnea.  BPH. Patient's had decreased libido and is concerned related to Proscar. Recently he tried to discontinue Flomax but had major obstructive symptoms. He has now started back Flomax and is urinating well. No significant nocturia.  Past Medical History  Diagnosis Date  . CARCINOMA, SKIN, SQUAMOUS CELL 09/11/2009  . HYPERLIPIDEMIA 03/13/2009  . INSOMNIA, CHRONIC 09/11/2009  . HYPERTENSION 03/13/2009  . Atrial fibrillation 04/07/2009  . PULMONARY NODULE 09/11/2009  . GERD 03/13/2009  . Rosacea 03/13/2009  . Actinic keratosis 03/13/2009  . SKIN CANCER, HX OF 03/13/2009  . COLONIC POLYPS, HX OF 03/13/2009  . BENIGN PROSTATIC HYPERTROPHY, HX OF 04/07/2009  . POLYPECTOMY, HX OF 04/07/2009   Past Surgical History  Procedure Date  . Lumbar disc surgery     RUPTURE  . Polypectomy     reports that he quit smoking about 24 years ago. His smoking use included Cigarettes. He has a 30 pack-year smoking history. He does not have any smokeless tobacco history on file. His alcohol and drug histories not on file. family history includes Cancer in his mother; Cancer (age of onset:84) in his father; and Heart disease in his brother and sister. Allergies  Allergen Reactions  . Amoxicillin-Pot Clavulanate     REACTION: GI upset, diarrhea, amoxicillin      Review of Systems  Constitutional: Negative for fatigue.  Eyes: Negative for visual disturbance.  Respiratory: Negative for cough, chest tightness and shortness of breath.   Cardiovascular: Negative for chest pain,  palpitations and leg swelling.  Neurological: Negative for dizziness, syncope, weakness, light-headedness and headaches.       Objective:   Physical Exam  Constitutional: He appears well-developed and well-nourished.  Neck: Neck supple. No thyromegaly present.  Cardiovascular: Normal rate and regular rhythm.   Pulmonary/Chest: Effort normal and breath sounds normal. No respiratory distress. He has no wheezes. He has no rales.  Musculoskeletal: He exhibits no edema.          Assessment & Plan:  #1 hypertension. Stable with less orthostasis. Still has occasional high readings but mostly controlled. Continue losartan 50 mg daily and close monitoring  #2 BPH. Symptomatically improved back on Flomax. Patient is concerned about low libido with finasteride and we'll try discontinuing.

## 2012-04-15 NOTE — Patient Instructions (Addendum)
Hold Finasteride for now. Continue Flomax. Schedule complete physical.

## 2012-05-20 DIAGNOSIS — L570 Actinic keratosis: Secondary | ICD-10-CM | POA: Diagnosis not present

## 2012-05-20 DIAGNOSIS — B078 Other viral warts: Secondary | ICD-10-CM | POA: Diagnosis not present

## 2012-05-26 ENCOUNTER — Encounter: Payer: Self-pay | Admitting: Cardiovascular Disease

## 2012-06-08 ENCOUNTER — Encounter: Payer: Medicare Other | Admitting: Family Medicine

## 2012-06-10 DIAGNOSIS — H524 Presbyopia: Secondary | ICD-10-CM | POA: Diagnosis not present

## 2012-06-10 DIAGNOSIS — H40019 Open angle with borderline findings, low risk, unspecified eye: Secondary | ICD-10-CM | POA: Diagnosis not present

## 2012-06-10 DIAGNOSIS — H1045 Other chronic allergic conjunctivitis: Secondary | ICD-10-CM | POA: Diagnosis not present

## 2012-06-10 DIAGNOSIS — H01009 Unspecified blepharitis unspecified eye, unspecified eyelid: Secondary | ICD-10-CM | POA: Diagnosis not present

## 2012-06-10 DIAGNOSIS — H35379 Puckering of macula, unspecified eye: Secondary | ICD-10-CM | POA: Diagnosis not present

## 2012-06-15 ENCOUNTER — Encounter: Payer: Medicare Other | Admitting: Family Medicine

## 2012-06-29 ENCOUNTER — Encounter: Payer: Medicare Other | Admitting: Family Medicine

## 2012-07-01 ENCOUNTER — Ambulatory Visit (INDEPENDENT_AMBULATORY_CARE_PROVIDER_SITE_OTHER): Payer: Medicare Other | Admitting: Family Medicine

## 2012-07-01 ENCOUNTER — Encounter: Payer: Self-pay | Admitting: Family Medicine

## 2012-07-01 ENCOUNTER — Other Ambulatory Visit: Payer: Self-pay | Admitting: *Deleted

## 2012-07-01 VITALS — BP 110/82 | HR 60 | Temp 98.0°F | Resp 12 | Ht 73.5 in | Wt 183.0 lb

## 2012-07-01 DIAGNOSIS — E876 Hypokalemia: Secondary | ICD-10-CM

## 2012-07-01 DIAGNOSIS — R7303 Prediabetes: Secondary | ICD-10-CM

## 2012-07-01 DIAGNOSIS — Z23 Encounter for immunization: Secondary | ICD-10-CM | POA: Diagnosis not present

## 2012-07-01 DIAGNOSIS — E785 Hyperlipidemia, unspecified: Secondary | ICD-10-CM | POA: Diagnosis not present

## 2012-07-01 DIAGNOSIS — R7309 Other abnormal glucose: Secondary | ICD-10-CM | POA: Diagnosis not present

## 2012-07-01 DIAGNOSIS — Z0000 Encounter for general adult medical examination without abnormal findings: Secondary | ICD-10-CM

## 2012-07-01 DIAGNOSIS — Z87898 Personal history of other specified conditions: Secondary | ICD-10-CM | POA: Diagnosis not present

## 2012-07-01 DIAGNOSIS — I10 Essential (primary) hypertension: Secondary | ICD-10-CM | POA: Diagnosis not present

## 2012-07-01 LAB — HEPATIC FUNCTION PANEL
ALT: 14 U/L (ref 0–53)
AST: 23 U/L (ref 0–37)
Albumin: 4 g/dL (ref 3.5–5.2)
Total Protein: 6.9 g/dL (ref 6.0–8.3)

## 2012-07-01 LAB — BASIC METABOLIC PANEL
CO2: 28 mEq/L (ref 19–32)
Calcium: 8.9 mg/dL (ref 8.4–10.5)
Glucose, Bld: 107 mg/dL — ABNORMAL HIGH (ref 70–99)
Potassium: 3.1 mEq/L — ABNORMAL LOW (ref 3.5–5.1)
Sodium: 138 mEq/L (ref 135–145)

## 2012-07-01 LAB — LIPID PANEL
HDL: 29 mg/dL — ABNORMAL LOW (ref >39.00)
Triglycerides: 134 mg/dL (ref 0.0–149.0)
VLDL: 26.8 mg/dL (ref 0.0–40.0)

## 2012-07-01 MED ORDER — POTASSIUM CHLORIDE CRYS ER 20 MEQ PO TBCR: 20.0000 meq | EXTENDED_RELEASE_TABLET | Freq: Every day | ORAL | Status: DC

## 2012-07-01 NOTE — Progress Notes (Signed)
Subjective:    Patient ID: [name], male    DOB: 1931-11-08, 78 y.o.   MRN: [id number]  HPI  Patient here for Medicare wellness exam and followup medical problems. She has history of hypertension, past history of ablation, hyperlipidemia, recurrent squamous cell carcinoma of the skin, GERD, BPH, prediabetes. Followed closely by dermatologist. Flu vaccine given. Pneumovax and tetanus up-to-date. No history of shingles vaccine. Colonoscopy up to date.  We made changes in blood pressure medication and we thought he was taking losartan 50 mg a day actually taking losartan HCTZ. Blood pressure is very well-controlled. No further orthostasis. No chest pains. No dyspnea.  BPH. Recently discontinued Proscar. He had low libido. He has not noted any increase in BPH symptoms since stopping this. He remains on Flomax but takes somewhat inconsistently. Occasionally has dizziness with Flomax. No major obstructive symptoms.  Hyperlipidemia treated with Mevacor. No myalgias or other side effects. Past history atrial fibrillation. Ablation procedure about 2 years ago. No evidence for recurrence.  Past Medical History  Diagnosis Date  . CARCINOMA, SKIN, SQUAMOUS CELL 09/11/2009  . HYPERLIPIDEMIA 03/13/2009  . INSOMNIA, CHRONIC 09/11/2009  . HYPERTENSION 03/13/2009  . Atrial fibrillation 04/07/2009  . PULMONARY NODULE 09/11/2009  . GERD 03/13/2009  . Rosacea 03/13/2009  . Actinic keratosis 03/13/2009  . SKIN CANCER, HX OF 03/13/2009  . COLONIC POLYPS, HX OF 03/13/2009  . BENIGN PROSTATIC HYPERTROPHY, HX OF 04/07/2009  . POLYPECTOMY, HX OF 04/07/2009   Past Surgical History  Procedure Date  . Lumbar disc surgery     RUPTURE  . Polypectomy     reports that he quit smoking about 24 years ago. His smoking use included Cigarettes. He has a 30 pack-year smoking history. He does not have any smokeless tobacco history on file. His alcohol and drug histories not on file. family history includes Cancer in his  mother; Cancer (age of onset:84) in his father; and Heart disease in his brother and sister. Allergies  Allergen Reactions  . Amoxicillin-Pot Clavulanate     REACTION: GI upset, diarrhea, amoxicillin   1.  Risk factors based on Past Medical , Social, and Family history reviewed and as above 2.  Limitations in physical activities no limitations. Plays golf frequently. Low risk for fall 3.  Depression/mood negative depression screen 4.  Hearing chronic hearing loss and uses hearing aids 5.  ADLs independent in all 6.  Cognitive function (orientation to time and place, language, writing, speech,memory) no memory deficits. Judgment and language intact 7.  Home Safety no issues identified 8.  Height, weight, and visual acuity. Height and weight stable. No recent visual changes. 9.  Counseling discussed diet. Discussed fall risk reduction. 10. Recommendation of preventive services. Flu vaccine given. Check on coverage for shingles vaccine. 11. Labs based on risk factors lipid panel, hepatic panel, basic metabolic panel 12. Care Plan as above    Review of Systems  Constitutional: Negative for fever, activity change, appetite change, fatigue and unexpected weight change.  HENT: Negative for ear pain, congestion and trouble swallowing.   Eyes: Negative for pain and visual disturbance.  Respiratory: Negative for cough, shortness of breath and wheezing.   Cardiovascular: Negative for chest pain and palpitations.  Gastrointestinal: Negative for nausea, vomiting, abdominal pain, diarrhea, constipation, blood in stool, abdominal distention and rectal pain.  Genitourinary: Negative for dysuria, hematuria and testicular pain.  Musculoskeletal: Negative for joint swelling and arthralgias.  Skin: Negative for rash.  Neurological: Negative for dizziness, syncope and headaches.  Hematological: Negative for adenopathy.  Psychiatric/Behavioral: Negative for confusion and dysphoric mood.         Objective:   Physical Exam  Constitutional: He is oriented to person, place, and time. He appears well-developed and well-nourished. No distress.  HENT:  Head: Normocephalic and atraumatic.  Right Ear: External ear normal.  Left Ear: External ear normal.  Mouth/Throat: Oropharynx is clear and moist.  Eyes: Conjunctivae normal and EOM are normal. Pupils are equal, round, and reactive to light.  Neck: Normal range of motion. Neck supple. No thyromegaly present.  Cardiovascular: Normal rate, regular rhythm and normal heart sounds.   No murmur heard. Pulmonary/Chest: No respiratory distress. He has no wheezes. He has no rales.  Abdominal: Soft. Bowel sounds are normal. He exhibits no distension and no mass. There is no tenderness. There is no rebound and no guarding.  Musculoskeletal: He exhibits no edema.  Lymphadenopathy:    He has no cervical adenopathy.  Neurological: He is alert and oriented to person, place, and time. He displays normal reflexes. No cranial nerve deficit.  Skin: No rash noted.       Patient has multiple hyperkeratotic scaly areas especially arms and head and face. History of actinic keratoses. Follow closely by dermatology.  Psychiatric: He has a normal mood and affect.          Assessment & Plan:  #1 complete physical. Flu vaccine given. Check on coverage for shingles vaccine. Pneumovax and tetanus up-to-date. Colonoscopy up to date. Discussed pros and cons of PSA testing and at his age we've recommended against routine testing #2 hypertension. Well controlled. Continue current medications. Confirm that he is taking losartan HCTZ versus plain losartan. Continue current medications unless recurrent orthostasis #3 history of atrial fibrillation. Previous ablation. Currently sinus rhythm #4 hyperlipidemia. Check lipid and hepatic panel

## 2012-07-01 NOTE — Progress Notes (Signed)
Quick Note:  Pt informed, labs ordered Rx sent ______

## 2012-07-08 ENCOUNTER — Other Ambulatory Visit (INDEPENDENT_AMBULATORY_CARE_PROVIDER_SITE_OTHER): Payer: Medicare Other

## 2012-07-08 DIAGNOSIS — I10 Essential (primary) hypertension: Secondary | ICD-10-CM

## 2012-07-08 LAB — BASIC METABOLIC PANEL
Calcium: 9 mg/dL (ref 8.4–10.5)
GFR: 55.22 mL/min — ABNORMAL LOW (ref >60.00)
Potassium: 3.6 mEq/L (ref 3.5–5.1)
Sodium: 141 mEq/L (ref 135–145)

## 2012-08-23 ENCOUNTER — Encounter: Payer: Self-pay | Admitting: Family Medicine

## 2012-08-23 NOTE — Telephone Encounter (Signed)
May refill.  Try to avoid regular use if possible.

## 2012-08-29 NOTE — Telephone Encounter (Signed)
This patient sent note on MyChart for Xanax refill.  Make sure future requests go through usual route.

## 2012-08-30 MED ORDER — ALPRAZOLAM 0.25 MG PO TABS: 0.2500 mg | ORAL_TABLET | Freq: Every evening | ORAL | Status: DC | PRN

## 2012-08-30 NOTE — Telephone Encounter (Signed)
Alprazolam

## 2012-08-31 ENCOUNTER — Ambulatory Visit: Payer: Medicare Other | Admitting: Family Medicine

## 2012-08-31 DIAGNOSIS — Z23 Encounter for immunization: Secondary | ICD-10-CM | POA: Insufficient documentation

## 2012-08-31 NOTE — Progress Notes (Signed)
  Subjective:    Patient ID: [name], male    DOB: 1931-10-01, 79 y.o.   MRN: [id number]  HPI    Review of Systems     Objective:   Physical Exam        Assessment & Plan:  Pt declined shingles vaccine due to cost.

## 2012-09-02 ENCOUNTER — Ambulatory Visit (INDEPENDENT_AMBULATORY_CARE_PROVIDER_SITE_OTHER): Payer: Medicare Other | Admitting: Family Medicine

## 2012-09-02 ENCOUNTER — Encounter: Payer: Self-pay | Admitting: Family Medicine

## 2012-09-02 VITALS — BP 122/68 | Temp 97.6°F | Wt 186.0 lb

## 2012-09-02 DIAGNOSIS — I10 Essential (primary) hypertension: Secondary | ICD-10-CM | POA: Diagnosis not present

## 2012-09-02 DIAGNOSIS — R209 Unspecified disturbances of skin sensation: Secondary | ICD-10-CM

## 2012-09-02 DIAGNOSIS — R208 Other disturbances of skin sensation: Secondary | ICD-10-CM

## 2012-09-02 MED ORDER — GABAPENTIN 100 MG PO CAPS: 100.0000 mg | ORAL_CAPSULE | Freq: Every day | ORAL | Status: DC

## 2012-09-02 NOTE — Progress Notes (Signed)
  Subjective:    Patient ID: [name], male    DOB: 1932-04-09, 79 y.o.   MRN: [id number]  HPI Seen for the following items  Hypertension. By home readings several 140s or 150s systolic. He remains on Corgard and losartan HCTZ. No headaches. No dizziness. No chest pains.  Left foot burning sensation medial aspect of the foot. Present for one month. No rash. Moderate to severe. No pain with ambulation. Symptoms worse at night. No injury. No alleviating factors. Remote history of low back surgery. No associated back pain.  No diabetes.  Past Medical History  Diagnosis Date  . CARCINOMA, SKIN, SQUAMOUS CELL 09/11/2009  . HYPERLIPIDEMIA 03/13/2009  . INSOMNIA, CHRONIC 09/11/2009  . HYPERTENSION 03/13/2009  . Atrial fibrillation 04/07/2009  . PULMONARY NODULE 09/11/2009  . GERD 03/13/2009  . Rosacea 03/13/2009  . Actinic keratosis 03/13/2009  . SKIN CANCER, HX OF 03/13/2009  . COLONIC POLYPS, HX OF 03/13/2009  . BENIGN PROSTATIC HYPERTROPHY, HX OF 04/07/2009  . POLYPECTOMY, HX OF 04/07/2009   Past Surgical History  Procedure Date  . Lumbar disc surgery     RUPTURE  . Polypectomy     reports that he quit smoking about 24 years ago. His smoking use included Cigarettes. He has a 30 pack-year smoking history. He does not have any smokeless tobacco history on file. His alcohol and drug histories not on file. family history includes Cancer in his mother; Cancer (age of onset:84) in his father; and Heart disease in his brother and sister. Allergies  Allergen Reactions  . Amoxicillin-Pot Clavulanate     REACTION: GI upset, diarrhea, amoxicillin      Review of Systems  Constitutional: Negative for fatigue.  Eyes: Negative for visual disturbance.  Respiratory: Negative for cough, chest tightness and shortness of breath.   Cardiovascular: Negative for chest pain, palpitations and leg swelling.  Genitourinary: Negative for dysuria.  Musculoskeletal: Negative for back pain and gait problem.    Neurological: Negative for dizziness, syncope, weakness, light-headedness and headaches.       Objective:   Physical Exam  Constitutional: He appears well-developed and well-nourished.  Cardiovascular: Normal rate and regular rhythm.   Pulmonary/Chest: Effort normal and breath sounds normal. No respiratory distress. He has no wheezes. He has no rales.  Musculoskeletal: Normal range of motion. He exhibits no edema and no tenderness.       Foot pulses normal.  Skin: No rash noted.          Assessment & Plan:  #1 hypertension. Suboptimal control by home readings but excellent control here. Continue monitoring. #2 left foot dysesthesias. Sounds more neuropathic. No circulation issues. Trial of low-dose gabapentin 100 mg at night and touch base one to 2 weeks if no better

## 2012-09-02 NOTE — Patient Instructions (Addendum)
Call in 1-2 weeks if foot pain no better

## 2012-09-20 ENCOUNTER — Ambulatory Visit (INDEPENDENT_AMBULATORY_CARE_PROVIDER_SITE_OTHER): Payer: Medicare Other | Admitting: Family Medicine

## 2012-09-20 ENCOUNTER — Telehealth: Payer: Self-pay | Admitting: Family Medicine

## 2012-09-20 ENCOUNTER — Encounter: Payer: Self-pay | Admitting: Family Medicine

## 2012-09-20 VITALS — BP 130/62 | Temp 97.3°F | Wt 179.0 lb

## 2012-09-20 DIAGNOSIS — R208 Other disturbances of skin sensation: Secondary | ICD-10-CM

## 2012-09-20 DIAGNOSIS — R209 Unspecified disturbances of skin sensation: Secondary | ICD-10-CM

## 2012-09-20 LAB — VITAMIN B12: Vitamin B-12: 238 pg/mL (ref 211–911)

## 2012-09-20 NOTE — Progress Notes (Signed)
  Subjective:    Patient ID: [name], male    DOB: Feb 09, 1932, 79 y.o.   MRN: [id number]  HPI Persistent tingling and burning sensation medial aspect left foot. Duration about 6 weeks. Refer to prior note. We started gabapentin 100 mg at night. After first couple of doses his pain was almost completely gone but has now returned. No low back pain. No weakness. No rash. Patient had questions about B12 status. No history of gastric surgery. Not a vegetarian. No other dysesthesias or numbness or burning in any other extremities.  Past Medical History  Diagnosis Date  . CARCINOMA, SKIN, SQUAMOUS CELL 09/11/2009  . HYPERLIPIDEMIA 03/13/2009  . INSOMNIA, CHRONIC 09/11/2009  . HYPERTENSION 03/13/2009  . Atrial fibrillation 04/07/2009  . PULMONARY NODULE 09/11/2009  . GERD 03/13/2009  . Rosacea 03/13/2009  . Actinic keratosis 03/13/2009  . SKIN CANCER, HX OF 03/13/2009  . COLONIC POLYPS, HX OF 03/13/2009  . BENIGN PROSTATIC HYPERTROPHY, HX OF 04/07/2009  . POLYPECTOMY, HX OF 04/07/2009   Past Surgical History  Procedure Date  . Lumbar disc surgery     RUPTURE  . Polypectomy     reports that he quit smoking about 24 years ago. His smoking use included Cigarettes. He has a 30 pack-year smoking history. He does not have any smokeless tobacco history on file. His alcohol and drug histories not on file. family history includes Cancer in his mother; Cancer (age of onset:84) in his father; and Heart disease in his brother and sister. Allergies  Allergen Reactions  . Amoxicillin-Pot Clavulanate     REACTION: GI upset, diarrhea, amoxicillin      Review of Systems  Constitutional: Negative for fever and chills.  Neurological: Negative for weakness.       Objective:   Physical Exam  Constitutional: He appears well-developed and well-nourished.  Cardiovascular: Normal rate and regular rhythm.   Pulmonary/Chest: Effort normal and breath sounds normal. No respiratory distress. He has no wheezes.  He has no rales.  Musculoskeletal: He exhibits no edema.       Has good distal foot pulses. Foot is warm to touch with good capillary refill. No rash. Nontender          Assessment & Plan:  Dysesthesia left foot. Sounds neuropathic. Check B12 level. Titrate gabapentin 200 mg at night

## 2012-09-20 NOTE — Telephone Encounter (Signed)
Caller: Carolyn/Spouse; Phone: [phone]; Reason for Call: Eber Jones is calling to see if pt needs to have blood work before his follow up appt with Dr Caryl Never.  Please verify if this is correct or if blood work will be done during the appt.

## 2012-09-20 NOTE — Telephone Encounter (Signed)
Pt wife infornmed

## 2012-09-20 NOTE — Telephone Encounter (Signed)
OK to do during the visit.

## 2012-09-20 NOTE — Patient Instructions (Addendum)
Titrate gabapentin to 2 at night and then after 3-4 days if no better go up to three at night.

## 2012-09-23 ENCOUNTER — Other Ambulatory Visit: Payer: Self-pay | Admitting: Family Medicine

## 2012-09-24 MED ORDER — GABAPENTIN 100 MG PO CAPS: 100.0000 mg | ORAL_CAPSULE | Freq: Every day | ORAL | Status: DC

## 2012-10-05 DIAGNOSIS — L98499 Non-pressure chronic ulcer of skin of other sites with unspecified severity: Secondary | ICD-10-CM | POA: Diagnosis not present

## 2012-10-05 DIAGNOSIS — L570 Actinic keratosis: Secondary | ICD-10-CM | POA: Diagnosis not present

## 2012-10-05 DIAGNOSIS — D485 Neoplasm of uncertain behavior of skin: Secondary | ICD-10-CM | POA: Diagnosis not present

## 2012-10-05 DIAGNOSIS — L820 Inflamed seborrheic keratosis: Secondary | ICD-10-CM | POA: Diagnosis not present

## 2012-10-05 DIAGNOSIS — Z85820 Personal history of malignant melanoma of skin: Secondary | ICD-10-CM | POA: Diagnosis not present

## 2012-11-16 DIAGNOSIS — C44319 Basal cell carcinoma of skin of other parts of face: Secondary | ICD-10-CM | POA: Diagnosis not present

## 2012-11-25 ENCOUNTER — Encounter: Payer: Self-pay | Admitting: Family Medicine

## 2012-11-25 DIAGNOSIS — E785 Hyperlipidemia, unspecified: Secondary | ICD-10-CM

## 2012-11-26 MED ORDER — LOVASTATIN 20 MG PO TABS: 20.0000 mg | ORAL_TABLET | Freq: Every day | ORAL | Status: DC

## 2012-12-03 ENCOUNTER — Other Ambulatory Visit: Payer: Self-pay | Admitting: *Deleted

## 2012-12-03 DIAGNOSIS — I4891 Unspecified atrial fibrillation: Secondary | ICD-10-CM

## 2012-12-03 DIAGNOSIS — I10 Essential (primary) hypertension: Secondary | ICD-10-CM

## 2012-12-03 MED ORDER — NADOLOL 20 MG PO TABS: 20.0000 mg | ORAL_TABLET | Freq: Every day | ORAL | Status: DC

## 2012-12-03 MED ORDER — LOSARTAN POTASSIUM-HCTZ 100-25 MG PO TABS: 1.0000 | ORAL_TABLET | Freq: Every day | ORAL | Status: DC

## 2012-12-03 MED ORDER — TAMSULOSIN HCL 0.4 MG PO CAPS: 0.4000 mg | ORAL_CAPSULE | Freq: Every day | ORAL | Status: DC

## 2012-12-13 ENCOUNTER — Other Ambulatory Visit: Payer: Self-pay | Admitting: Family Medicine

## 2013-01-06 DIAGNOSIS — D044 Carcinoma in situ of skin of scalp and neck: Secondary | ICD-10-CM | POA: Diagnosis not present

## 2013-01-06 DIAGNOSIS — C44529 Squamous cell carcinoma of skin of other part of trunk: Secondary | ICD-10-CM | POA: Diagnosis not present

## 2013-01-06 DIAGNOSIS — L408 Other psoriasis: Secondary | ICD-10-CM | POA: Diagnosis not present

## 2013-01-06 DIAGNOSIS — D485 Neoplasm of uncertain behavior of skin: Secondary | ICD-10-CM | POA: Diagnosis not present

## 2013-01-06 DIAGNOSIS — L570 Actinic keratosis: Secondary | ICD-10-CM | POA: Diagnosis not present

## 2013-01-06 DIAGNOSIS — Z85820 Personal history of malignant melanoma of skin: Secondary | ICD-10-CM | POA: Diagnosis not present

## 2013-01-06 DIAGNOSIS — Z85828 Personal history of other malignant neoplasm of skin: Secondary | ICD-10-CM | POA: Diagnosis not present

## 2013-02-01 DIAGNOSIS — H35379 Puckering of macula, unspecified eye: Secondary | ICD-10-CM | POA: Diagnosis not present

## 2013-02-01 DIAGNOSIS — H01009 Unspecified blepharitis unspecified eye, unspecified eyelid: Secondary | ICD-10-CM | POA: Diagnosis not present

## 2013-02-01 DIAGNOSIS — H40019 Open angle with borderline findings, low risk, unspecified eye: Secondary | ICD-10-CM | POA: Diagnosis not present

## 2013-02-01 DIAGNOSIS — H04129 Dry eye syndrome of unspecified lacrimal gland: Secondary | ICD-10-CM | POA: Diagnosis not present

## 2013-02-01 DIAGNOSIS — L719 Rosacea, unspecified: Secondary | ICD-10-CM | POA: Diagnosis not present

## 2013-02-01 DIAGNOSIS — H35039 Hypertensive retinopathy, unspecified eye: Secondary | ICD-10-CM | POA: Diagnosis not present

## 2013-02-01 DIAGNOSIS — H2510 Age-related nuclear cataract, unspecified eye: Secondary | ICD-10-CM | POA: Diagnosis not present

## 2013-02-10 DIAGNOSIS — C44529 Squamous cell carcinoma of skin of other part of trunk: Secondary | ICD-10-CM | POA: Diagnosis not present

## 2013-02-10 DIAGNOSIS — D044 Carcinoma in situ of skin of scalp and neck: Secondary | ICD-10-CM | POA: Diagnosis not present

## 2013-02-10 DIAGNOSIS — R7611 Nonspecific reaction to tuberculin skin test without active tuberculosis: Secondary | ICD-10-CM | POA: Diagnosis not present

## 2013-02-24 DIAGNOSIS — H40019 Open angle with borderline findings, low risk, unspecified eye: Secondary | ICD-10-CM | POA: Diagnosis not present

## 2013-02-24 DIAGNOSIS — H04129 Dry eye syndrome of unspecified lacrimal gland: Secondary | ICD-10-CM | POA: Diagnosis not present

## 2013-04-14 DIAGNOSIS — L570 Actinic keratosis: Secondary | ICD-10-CM | POA: Diagnosis not present

## 2013-06-09 DIAGNOSIS — L570 Actinic keratosis: Secondary | ICD-10-CM | POA: Diagnosis not present

## 2013-06-30 ENCOUNTER — Other Ambulatory Visit: Payer: Self-pay

## 2013-07-06 ENCOUNTER — Ambulatory Visit (INDEPENDENT_AMBULATORY_CARE_PROVIDER_SITE_OTHER): Payer: Medicare Other

## 2013-07-06 DIAGNOSIS — Z23 Encounter for immunization: Secondary | ICD-10-CM | POA: Diagnosis not present

## 2013-07-19 DIAGNOSIS — L570 Actinic keratosis: Secondary | ICD-10-CM | POA: Diagnosis not present

## 2013-07-28 DIAGNOSIS — H04209 Unspecified epiphora, unspecified lacrimal gland: Secondary | ICD-10-CM | POA: Diagnosis not present

## 2013-07-28 DIAGNOSIS — H40019 Open angle with borderline findings, low risk, unspecified eye: Secondary | ICD-10-CM | POA: Diagnosis not present

## 2013-07-28 DIAGNOSIS — L719 Rosacea, unspecified: Secondary | ICD-10-CM | POA: Diagnosis not present

## 2013-07-28 DIAGNOSIS — H1045 Other chronic allergic conjunctivitis: Secondary | ICD-10-CM | POA: Diagnosis not present

## 2013-07-28 DIAGNOSIS — H02839 Dermatochalasis of unspecified eye, unspecified eyelid: Secondary | ICD-10-CM | POA: Diagnosis not present

## 2013-07-28 DIAGNOSIS — H04129 Dry eye syndrome of unspecified lacrimal gland: Secondary | ICD-10-CM | POA: Diagnosis not present

## 2013-08-02 DIAGNOSIS — H2510 Age-related nuclear cataract, unspecified eye: Secondary | ICD-10-CM | POA: Diagnosis not present

## 2013-08-02 DIAGNOSIS — H01009 Unspecified blepharitis unspecified eye, unspecified eyelid: Secondary | ICD-10-CM | POA: Diagnosis not present

## 2013-08-02 DIAGNOSIS — H40019 Open angle with borderline findings, low risk, unspecified eye: Secondary | ICD-10-CM | POA: Diagnosis not present

## 2013-08-02 DIAGNOSIS — H35379 Puckering of macula, unspecified eye: Secondary | ICD-10-CM | POA: Diagnosis not present

## 2013-08-02 DIAGNOSIS — H524 Presbyopia: Secondary | ICD-10-CM | POA: Diagnosis not present

## 2013-08-23 DIAGNOSIS — H269 Unspecified cataract: Secondary | ICD-10-CM | POA: Diagnosis not present

## 2013-08-23 DIAGNOSIS — H2510 Age-related nuclear cataract, unspecified eye: Secondary | ICD-10-CM | POA: Diagnosis not present

## 2013-09-19 ENCOUNTER — Other Ambulatory Visit: Payer: Self-pay | Admitting: Family Medicine

## 2013-09-20 DIAGNOSIS — H2510 Age-related nuclear cataract, unspecified eye: Secondary | ICD-10-CM | POA: Diagnosis not present

## 2013-09-22 ENCOUNTER — Telehealth: Payer: Self-pay | Admitting: Family Medicine

## 2013-09-22 MED ORDER — NADOLOL 20 MG PO TABS: ORAL_TABLET | ORAL | Status: DC

## 2013-09-22 NOTE — Telephone Encounter (Signed)
Pt needs refill nadolol (CORGARD) 20 MG tablet  1 /day Pt made cpe  appt for 2/6 at 10 am Can you fill 30 days until pt can get to appt? [facility]

## 2013-09-22 NOTE — Telephone Encounter (Signed)
Rx sent to pharmacy   

## 2013-09-27 DIAGNOSIS — H2510 Age-related nuclear cataract, unspecified eye: Secondary | ICD-10-CM | POA: Diagnosis not present

## 2013-09-27 DIAGNOSIS — H269 Unspecified cataract: Secondary | ICD-10-CM | POA: Diagnosis not present

## 2013-09-30 ENCOUNTER — Other Ambulatory Visit: Payer: Self-pay

## 2013-09-30 ENCOUNTER — Telehealth: Payer: Self-pay | Admitting: Family Medicine

## 2013-09-30 ENCOUNTER — Encounter: Payer: Self-pay | Admitting: Family Medicine

## 2013-09-30 ENCOUNTER — Ambulatory Visit (INDEPENDENT_AMBULATORY_CARE_PROVIDER_SITE_OTHER): Payer: Medicare Other | Admitting: Family Medicine

## 2013-09-30 VITALS — BP 130/70 | HR 51 | Ht 73.5 in | Wt 180.0 lb

## 2013-09-30 DIAGNOSIS — I4891 Unspecified atrial fibrillation: Secondary | ICD-10-CM

## 2013-09-30 DIAGNOSIS — E785 Hyperlipidemia, unspecified: Secondary | ICD-10-CM

## 2013-09-30 DIAGNOSIS — I10 Essential (primary) hypertension: Secondary | ICD-10-CM | POA: Diagnosis not present

## 2013-09-30 DIAGNOSIS — K219 Gastro-esophageal reflux disease without esophagitis: Secondary | ICD-10-CM

## 2013-09-30 DIAGNOSIS — G4700 Insomnia, unspecified: Secondary | ICD-10-CM

## 2013-09-30 DIAGNOSIS — Z0000 Encounter for general adult medical examination without abnormal findings: Secondary | ICD-10-CM

## 2013-09-30 MED ORDER — LOVASTATIN 20 MG PO TABS: 20.0000 mg | ORAL_TABLET | Freq: Every day | ORAL | Status: DC

## 2013-09-30 MED ORDER — NADOLOL 20 MG PO TABS: ORAL_TABLET | ORAL | Status: DC

## 2013-09-30 MED ORDER — LOSARTAN POTASSIUM-HCTZ 100-25 MG PO TABS: 1.0000 | ORAL_TABLET | Freq: Every day | ORAL | Status: DC

## 2013-09-30 NOTE — Telephone Encounter (Signed)
Patient is requesting a refill on 3 or 4 different medications and he told me that "you all should know what they are". He also stated that the normal process is to fax them to pharmacy. Thanks!!

## 2013-09-30 NOTE — Telephone Encounter (Signed)
Pt RXs sent to [facility]

## 2013-09-30 NOTE — Progress Notes (Signed)
Subjective:    Patient ID: [name], male    DOB: Jan 19, 1932, 80 y.o.   MRN: [id number]  HPI Patient seen for Medicare wellness exam and medical followup. He has remote history of atrial fibrillation and underwent ablation procedure several years ago has not had any recurrent atrial fibrillation. His other medical problems include history of hypertension, hyperlipidemia, chronic GERD, history of multiple skin cancers followed closely by dermatology, history prediabetes, history of rosacea. He has BPH and takes Flomax for that. Those symptoms are relatively stable. Blood pressure stable. No recent orthostasis. Hyperlipidemia treated with lovastatin. No CAD history.  Colonoscopy up to date. Immunizations up-to-date with exception of no history of shingles vaccine. He is undecided at this point.  Past Medical History  Diagnosis Date  . CARCINOMA, SKIN, SQUAMOUS CELL 09/11/2009  . HYPERLIPIDEMIA 03/13/2009  . INSOMNIA, CHRONIC 09/11/2009  . HYPERTENSION 03/13/2009  . Atrial fibrillation 04/07/2009  . PULMONARY NODULE 09/11/2009  . GERD 03/13/2009  . Rosacea 03/13/2009  . Actinic keratosis 03/13/2009  . SKIN CANCER, HX OF 03/13/2009  . COLONIC POLYPS, HX OF 03/13/2009  . BENIGN PROSTATIC HYPERTROPHY, HX OF 04/07/2009  . POLYPECTOMY, HX OF 04/07/2009   Past Surgical History  Procedure Laterality Date  . Lumbar disc surgery      RUPTURE  . Polypectomy      reports that he quit smoking about 25 years ago. His smoking use included Cigarettes. He has a 30 pack-year smoking history. He does not have any smokeless tobacco history on file. His alcohol and drug histories are not on file. family history includes Cancer in his mother; Cancer (age of onset: 56) in his father; Heart disease in his brother and sister. Allergies  Allergen Reactions  . Amoxicillin-Pot Clavulanate     REACTION: GI upset, diarrhea, amoxicillin   1.  Risk factors based on Past Medical , Social, and Family history  reviewed and as above 2.  Limitations in physical activities no limitation in activities. Walks some for exercise 3.  Depression/mood no depression or anxiety issues 4.  Hearing has some chronic deficits which are stable. He uses hearing aids 5.  ADLs independent in all 6.  Cognitive function (orientation to time and place, language, writing, speech,memory) memory intact. Limited judgment intact 7.  Home Safety no issues identified 8.  Height, weight, and visual acuity. 9.  Counseling discussed fall risk prevention. Continue regular weightbearing exercise 10. Recommendation of preventive services. Recommend consideration for shingles vaccine and he wishes to defer at this time. 11. Labs based on risk factors lipid, hepatic, basic metabolic panel 12. Care Plan as above    Review of Systems  Constitutional: Positive for appetite change and fatigue.  HENT: Negative for congestion and trouble swallowing.   Respiratory: Negative for cough and shortness of breath.   Cardiovascular: Negative for chest pain, palpitations and leg swelling.  Gastrointestinal: Negative for nausea, vomiting, abdominal pain, diarrhea, constipation and blood in stool.  Endocrine: Negative for polydipsia and polyuria.  Genitourinary: Negative for dysuria.  Musculoskeletal: Positive for back pain.  Neurological: Negative for dizziness.       Objective:   Physical Exam  Constitutional: He is oriented to person, place, and time. He appears well-developed and well-nourished. No distress.  HENT:  Head: Normocephalic and atraumatic.  Right Ear: External ear normal.  Left Ear: External ear normal.  Mouth/Throat: Oropharynx is clear and moist.  Eyes: Conjunctivae and EOM are normal. Pupils are equal, round, and reactive to light.  Neck:  Normal range of motion. Neck supple. No thyromegaly present.  Cardiovascular: Normal rate, regular rhythm and normal heart sounds.   No murmur heard. Pulmonary/Chest: No respiratory  distress. He has no wheezes. He has no rales.  Abdominal: Soft. Bowel sounds are normal. He exhibits no distension and no mass. There is no tenderness. There is no rebound and no guarding.  Musculoskeletal: He exhibits no edema.  Lymphadenopathy:    He has no cervical adenopathy.  Neurological: He is alert and oriented to person, place, and time. He displays normal reflexes. No cranial nerve deficit.  Skin: No rash noted.  Psychiatric: He has a normal mood and affect.          Assessment & Plan:  #1 complete physical. Shingles vaccine recommended. He wishes to defer. Continue yearly flu vaccine. Consider Prevnar 13 but he wishes to defer. We discussed the importance of regular weightbearing exercise. #2 hypertension. Stable at goal. Continue current medications. Check basic metabolic panel #3 hyperlipidemia. Check lipid and hepatic panel #4 GERD stable on chronic omeprazole. Continue oral B12 supplement. #5 history of BPH stable on Flomax. We discussed pros and cons of PSA screening and at his age we've recommended no further PSA

## 2013-09-30 NOTE — Progress Notes (Signed)
Pre visit review using our clinic review tool, if applicable. No additional management support is needed unless otherwise documented below in the visit note. 

## 2013-10-03 ENCOUNTER — Telehealth: Payer: Self-pay | Admitting: Family Medicine

## 2013-10-03 LAB — BASIC METABOLIC PANEL
BUN: 23 mg/dL (ref 6–23)
CALCIUM: 9.8 mg/dL (ref 8.4–10.5)
CO2: 23 mEq/L (ref 19–32)
CREATININE: 1.3 mg/dL (ref 0.4–1.5)
Chloride: 104 mEq/L (ref 96–112)
GFR: 54.57 mL/min — AB (ref >60.00)
GLUCOSE: 84 mg/dL (ref 70–99)
Potassium: 4.2 mEq/L (ref 3.5–5.1)
SODIUM: 142 meq/L (ref 135–145)

## 2013-10-03 LAB — HEPATIC FUNCTION PANEL
ALK PHOS: 56 U/L (ref 39–117)
ALT: 14 U/L (ref 0–53)
AST: 23 U/L (ref 0–37)
Albumin: 4.4 g/dL (ref 3.5–5.2)
BILIRUBIN DIRECT: 0.1 mg/dL (ref 0.0–0.3)
TOTAL PROTEIN: 7.4 g/dL (ref 6.0–8.3)
Total Bilirubin: 0.8 mg/dL (ref 0.3–1.2)

## 2013-10-03 LAB — LDL CHOLESTEROL, DIRECT: Direct LDL: 79.3 mg/dL

## 2013-10-03 LAB — LIPID PANEL
Cholesterol: 142 mg/dL (ref 0–200)
HDL: 31.8 mg/dL — ABNORMAL LOW (ref >39.00)
Total CHOL/HDL Ratio: 4
Triglycerides: 216 mg/dL — ABNORMAL HIGH (ref 0.0–149.0)
VLDL: 43.2 mg/dL — ABNORMAL HIGH (ref 0.0–40.0)

## 2013-10-03 NOTE — Telephone Encounter (Signed)
Relevant patient education assigned to patient using Emmi. ° °

## 2013-10-04 DIAGNOSIS — L570 Actinic keratosis: Secondary | ICD-10-CM | POA: Diagnosis not present

## 2013-11-08 DIAGNOSIS — L570 Actinic keratosis: Secondary | ICD-10-CM | POA: Diagnosis not present

## 2013-11-08 DIAGNOSIS — L408 Other psoriasis: Secondary | ICD-10-CM | POA: Diagnosis not present

## 2013-11-08 DIAGNOSIS — D485 Neoplasm of uncertain behavior of skin: Secondary | ICD-10-CM | POA: Diagnosis not present

## 2013-11-08 DIAGNOSIS — C4441 Basal cell carcinoma of skin of scalp and neck: Secondary | ICD-10-CM | POA: Diagnosis not present

## 2013-11-08 DIAGNOSIS — L719 Rosacea, unspecified: Secondary | ICD-10-CM | POA: Diagnosis not present

## 2013-11-08 DIAGNOSIS — C4442 Squamous cell carcinoma of skin of scalp and neck: Secondary | ICD-10-CM | POA: Diagnosis not present

## 2013-11-15 ENCOUNTER — Other Ambulatory Visit: Payer: Self-pay

## 2013-11-15 ENCOUNTER — Telehealth: Payer: Self-pay | Admitting: Family Medicine

## 2013-11-15 MED ORDER — NADOLOL 20 MG PO TABS: ORAL_TABLET | ORAL | Status: DC

## 2013-11-15 NOTE — Telephone Encounter (Signed)
RX sent to pharmacy  

## 2013-11-15 NOTE — Telephone Encounter (Signed)
[id number] - [place], [place] - [address] is requesting a re-fill on nadolol (CORGARD) 20 MG tablet

## 2013-11-17 ENCOUNTER — Encounter: Payer: Self-pay | Admitting: Family Medicine

## 2013-11-17 ENCOUNTER — Ambulatory Visit (INDEPENDENT_AMBULATORY_CARE_PROVIDER_SITE_OTHER): Payer: Medicare Other | Admitting: Family Medicine

## 2013-11-17 VITALS — BP 120/74 | HR 64 | Temp 97.6°F | Wt 181.0 lb

## 2013-11-17 DIAGNOSIS — M792 Neuralgia and neuritis, unspecified: Secondary | ICD-10-CM

## 2013-11-17 DIAGNOSIS — G5790 Unspecified mononeuropathy of unspecified lower limb: Secondary | ICD-10-CM

## 2013-11-17 MED ORDER — PREDNISONE 10 MG PO TABS: ORAL_TABLET | ORAL | Status: DC

## 2013-11-17 NOTE — Progress Notes (Signed)
Pre visit review using our clinic review tool, if applicable. No additional management support is needed unless otherwise documented below in the visit note. 

## 2013-11-17 NOTE — Progress Notes (Signed)
   Subjective:    Patient ID: [name], male    DOB: 1932-05-26, 80 y.o.   MRN: [id number]  HPI Patient seen with right lower extremity pain. This is a new symptom. Onset this past Sunday. He describes a long and he is in a  dulll to sharp throbbing achy pain which radiates from his anterior and lateral hip region all the way down to his lower leg at times. He denies any back pain. His pain is 8-9 on severity at times. This is constant but does wax and wane in severity. No skin rash. No numbness or weakness. No urine or stool incontinence. He takes ibuprofen and Aleve without much improvement. Some leftover gabapentin to 200 mg last night which did seem to help him slightly.  Denies recent injury. No appetite or weight changes. No clear exacerbating factors. Patient had remote history of back surgery 1997 and states that those symptoms seem somewhat different.  Past Medical History  Diagnosis Date  . CARCINOMA, SKIN, SQUAMOUS CELL 09/11/2009  . HYPERLIPIDEMIA 03/13/2009  . INSOMNIA, CHRONIC 09/11/2009  . HYPERTENSION 03/13/2009  . Atrial fibrillation 04/07/2009  . PULMONARY NODULE 09/11/2009  . GERD 03/13/2009  . Rosacea 03/13/2009  . Actinic keratosis 03/13/2009  . SKIN CANCER, HX OF 03/13/2009  . COLONIC POLYPS, HX OF 03/13/2009  . BENIGN PROSTATIC HYPERTROPHY, HX OF 04/07/2009  . POLYPECTOMY, HX OF 04/07/2009   Past Surgical History  Procedure Laterality Date  . Lumbar disc surgery      RUPTURE  . Polypectomy      reports that he quit smoking about 25 years ago. His smoking use included Cigarettes. He has a 30 pack-year smoking history. He does not have any smokeless tobacco history on file. His alcohol and drug histories are not on file. family history includes Cancer in his mother; Cancer (age of onset: 29) in his father; Heart disease in his brother and sister. Allergies  Allergen Reactions  . Amoxicillin-Pot Clavulanate     REACTION: GI upset, diarrhea, amoxicillin       Review of Systems  Constitutional: Negative for fever, chills, appetite change and unexpected weight change.  Gastrointestinal: Negative for abdominal pain.  Genitourinary: Negative for dysuria.  Musculoskeletal: Negative for back pain and joint swelling.  Skin: Negative for rash.  Neurological: Negative for weakness and numbness.  Hematological: Negative for adenopathy.       Objective:   Physical Exam  Constitutional: He appears well-developed and well-nourished.  Cardiovascular: Normal rate and regular rhythm.   Pulmonary/Chest: Effort normal and breath sounds normal. No respiratory distress. He has no wheezes. He has no rales.  Musculoskeletal: He exhibits no edema.  No reproducible point tenderness. Full range of motion right hip and right knee. Good distal pulses in the feet  Neurological:  Full-strength lower extremity. 1+ reflexes ankle and knee bilaterally. Normal sensory function to touch.  Skin: No rash noted.          Assessment & Plan:  Right lower extremity pain. This sounds more neuropathic. He does not have any rash at this point to suggest shingles but would consider this possibility. Straight leg raises are negative. Continue gabapentin at night with caution about side effects such as sedation and dizziness. Prednisone taper starting at 40 mg daily. Reassess one week and sooner if any numbness or weakness or skin rash

## 2013-11-17 NOTE — Patient Instructions (Signed)
Follow for any progressive numbness, pain, or weakness. May take up to 3 gabapentin !00 mg at night.

## 2013-11-24 ENCOUNTER — Ambulatory Visit (INDEPENDENT_AMBULATORY_CARE_PROVIDER_SITE_OTHER): Payer: Medicare Other | Admitting: Family Medicine

## 2013-11-24 ENCOUNTER — Encounter: Payer: Self-pay | Admitting: Family Medicine

## 2013-11-24 ENCOUNTER — Other Ambulatory Visit: Payer: Self-pay

## 2013-11-24 VITALS — BP 120/72 | HR 62 | Temp 97.5°F | Wt 181.0 lb

## 2013-11-24 DIAGNOSIS — M5416 Radiculopathy, lumbar region: Secondary | ICD-10-CM

## 2013-11-24 DIAGNOSIS — IMO0002 Reserved for concepts with insufficient information to code with codable children: Secondary | ICD-10-CM

## 2013-11-24 MED ORDER — PREDNISONE 10 MG PO TABS: ORAL_TABLET | ORAL | Status: DC

## 2013-11-24 NOTE — Patient Instructions (Signed)

## 2013-11-24 NOTE — Progress Notes (Signed)
   Subjective:    Patient ID: [name], male    DOB: 1931-11-02, 80 y.o.   MRN: [id number]  HPI Patient seen for followup regarding recent right lumbar radiculopathy symptoms. His pain was significantly improved starting prednisone and currently 5/10 it is worse previously 9/10-10 out of 10. He's also taking gabapentin at night. Denies any urine or stool incontinence. No numbness or weakness. Overall he is pleased with the improvement. No recent injury.  He is inquiring about shingles vaccine but knows that he cannot take this fall finishing up prednisone.  Past Medical History  Diagnosis Date  . CARCINOMA, SKIN, SQUAMOUS CELL 09/11/2009  . HYPERLIPIDEMIA 03/13/2009  . INSOMNIA, CHRONIC 09/11/2009  . HYPERTENSION 03/13/2009  . Atrial fibrillation 04/07/2009  . PULMONARY NODULE 09/11/2009  . GERD 03/13/2009  . Rosacea 03/13/2009  . Actinic keratosis 03/13/2009  . SKIN CANCER, HX OF 03/13/2009  . COLONIC POLYPS, HX OF 03/13/2009  . BENIGN PROSTATIC HYPERTROPHY, HX OF 04/07/2009  . POLYPECTOMY, HX OF 04/07/2009   Past Surgical History  Procedure Laterality Date  . Lumbar disc surgery      RUPTURE  . Polypectomy      reports that he quit smoking about 25 years ago. His smoking use included Cigarettes. He has a 30 pack-year smoking history. He does not have any smokeless tobacco history on file. His alcohol and drug histories are not on file. family history includes Cancer in his mother; Cancer (age of onset: 46) in his father; Heart disease in his brother and sister. Allergies  Allergen Reactions  . Amoxicillin-Pot Clavulanate     REACTION: GI upset, diarrhea, amoxicillin      Review of Systems  Constitutional: Negative for fever, activity change and appetite change.  Respiratory: Negative for cough and shortness of breath.   Cardiovascular: Negative for chest pain and leg swelling.  Gastrointestinal: Negative for vomiting and abdominal pain.  Genitourinary: Negative for dysuria,  hematuria and flank pain.  Musculoskeletal: Positive for back pain. Negative for joint swelling.  Neurological: Negative for weakness and numbness.       Objective:   Physical Exam  Constitutional: He appears well-developed and well-nourished.  Cardiovascular: Normal rate and regular rhythm.   Pulmonary/Chest: Effort normal and breath sounds normal. No respiratory distress. He has no wheezes. He has no rales.  Musculoskeletal: He exhibits no edema.  Straight leg raise is negative on the right Full range of motion right hip. No lateral hip tenderness  Neurological:  Full-strength lower extremity with symmetric reflexes          Assessment & Plan:  Right lumbar radiculopathy symptoms. Improved on prednisone. Finish out current prednisone dosage. Continue gabapentin at night. Followup promptly for any weakness or numbness. We recommended observation otherwise this time.  Health maintenance. Will check on coverage for shingles vaccine but will not give at this time secondary to prednisone.

## 2013-11-24 NOTE — Progress Notes (Signed)
Pre visit review using our clinic review tool, if applicable. No additional management support is needed unless otherwise documented below in the visit note. 

## 2013-12-07 ENCOUNTER — Ambulatory Visit (INDEPENDENT_AMBULATORY_CARE_PROVIDER_SITE_OTHER): Payer: Medicare Other | Admitting: Family Medicine

## 2013-12-07 ENCOUNTER — Encounter: Payer: Self-pay | Admitting: Family Medicine

## 2013-12-07 VITALS — BP 126/70 | HR 61 | Temp 97.7°F | Wt 178.0 lb

## 2013-12-07 DIAGNOSIS — M48061 Spinal stenosis, lumbar region without neurogenic claudication: Secondary | ICD-10-CM | POA: Diagnosis not present

## 2013-12-07 DIAGNOSIS — IMO0002 Reserved for concepts with insufficient information to code with codable children: Secondary | ICD-10-CM | POA: Diagnosis not present

## 2013-12-07 DIAGNOSIS — M5416 Radiculopathy, lumbar region: Secondary | ICD-10-CM

## 2013-12-07 MED ORDER — GABAPENTIN 300 MG PO CAPS: 300.0000 mg | ORAL_CAPSULE | Freq: Every day | ORAL | Status: DC

## 2013-12-07 NOTE — Patient Instructions (Signed)

## 2013-12-07 NOTE — Progress Notes (Signed)
Pre visit review using our clinic review tool, if applicable. No additional management support is needed unless otherwise documented below in the visit note. 

## 2013-12-07 NOTE — Progress Notes (Addendum)
   Subjective:    Patient ID: [name], male    DOB: April 07, 1932, 80 y.o.   MRN: [id number]  Hip Pain    Patient seen with ongoing right lumbar radiculopathy symptoms. He's now been on 2 courses of prednisone and each time his pain improved-though never resolved. He describes a dull to throbbing pain from his right lumbar area to about mid calf. Currently 5/10 pain but at times this has been up as high as 9-10 out of 10. He is taking gabapentin 200 mg at night. No urine or stool incontinence. No alleviating factors. Pain exacerbated by movement. No fever or chills. No appetite or weight changes. Remote history of back surgery 1997. Pain frequently interfering with sleep.  Past Medical History  Diagnosis Date  . CARCINOMA, SKIN, SQUAMOUS CELL 09/11/2009  . HYPERLIPIDEMIA 03/13/2009  . INSOMNIA, CHRONIC 09/11/2009  . HYPERTENSION 03/13/2009  . Atrial fibrillation 04/07/2009  . PULMONARY NODULE 09/11/2009  . GERD 03/13/2009  . Rosacea 03/13/2009  . Actinic keratosis 03/13/2009  . SKIN CANCER, HX OF 03/13/2009  . COLONIC POLYPS, HX OF 03/13/2009  . BENIGN PROSTATIC HYPERTROPHY, HX OF 04/07/2009  . POLYPECTOMY, HX OF 04/07/2009   Past Surgical History  Procedure Laterality Date  . Lumbar disc surgery      RUPTURE  . Polypectomy      reports that he quit smoking about 25 years ago. His smoking use included Cigarettes. He has a 30 pack-year smoking history. He does not have any smokeless tobacco history on file. His alcohol and drug histories are not on file. family history includes Cancer in his mother; Cancer (age of onset: 51) in his father; Heart disease in his brother and sister. Allergies  Allergen Reactions  . Amoxicillin-Pot Clavulanate     REACTION: GI upset, diarrhea, amoxicillin      Review of Systems  Constitutional: Negative for fever, chills, appetite change and unexpected weight change.  Gastrointestinal: Negative for abdominal pain.  Genitourinary: Negative for  difficulty urinating.  Musculoskeletal: Positive for back pain.       Objective:   Physical Exam  Constitutional: He appears well-developed and well-nourished.  Cardiovascular: Normal rate.   Pulmonary/Chest: Effort normal and breath sounds normal. No respiratory distress. He has no wheezes. He has no rales.  Musculoskeletal: He exhibits no edema.  Straight leg raise is negative on the right.  Neurological:  Deep tendon reflexes are symmetric in the knee. 1+ reflexes ankle bilaterally. Full-strength with plantar flexion, dorsiflexion, knee extension bilaterally.          Assessment & Plan:  Persistent right lumbar radiculopathy symptoms. Has already been on two courses of prednisone -each time with temporary improvement. Titrate gabapentin 300 mg each bedtime. Schedule MRI lumbar spine to further assess  MRI reviewed with patient. Multilevel degenerative disc disease with severe spinal stenosis at L4-5.  Pt notified.  Will set up to see neurosurgery, though at present his symptoms are improved following prednisone therapy.

## 2013-12-13 DIAGNOSIS — L408 Other psoriasis: Secondary | ICD-10-CM | POA: Diagnosis not present

## 2013-12-15 ENCOUNTER — Ambulatory Visit
Admission: RE | Admit: 2013-12-15 | Discharge: 2013-12-15 | Disposition: A | Discharge status: Home / Self Care | Payer: PRIVATE HEALTH INSURANCE | Source: Ambulatory Visit | Attending: Family Medicine | Admitting: Family Medicine

## 2013-12-15 DIAGNOSIS — M5416 Radiculopathy, lumbar region: Secondary | ICD-10-CM

## 2013-12-15 DIAGNOSIS — M48061 Spinal stenosis, lumbar region without neurogenic claudication: Secondary | ICD-10-CM | POA: Diagnosis not present

## 2013-12-15 DIAGNOSIS — M47817 Spondylosis without myelopathy or radiculopathy, lumbosacral region: Secondary | ICD-10-CM | POA: Diagnosis not present

## 2013-12-15 DIAGNOSIS — M5137 Other intervertebral disc degeneration, lumbosacral region: Secondary | ICD-10-CM | POA: Diagnosis not present

## 2013-12-15 NOTE — Addendum Note (Signed)
Addended by: [name] on: 12/15/2013 09:56 PM   Modules accepted: Orders

## 2013-12-20 DIAGNOSIS — C4442 Squamous cell carcinoma of skin of scalp and neck: Secondary | ICD-10-CM | POA: Diagnosis not present

## 2013-12-20 DIAGNOSIS — C4441 Basal cell carcinoma of skin of scalp and neck: Secondary | ICD-10-CM | POA: Diagnosis not present

## 2014-01-04 ENCOUNTER — Telehealth: Payer: Self-pay | Admitting: Family Medicine

## 2014-01-04 MED ORDER — GABAPENTIN 300 MG PO CAPS: 300.0000 mg | ORAL_CAPSULE | Freq: Every day | ORAL | Status: DC

## 2014-01-04 NOTE — Telephone Encounter (Signed)
Rx sent to pharmacy   

## 2014-01-04 NOTE — Telephone Encounter (Signed)
[id number] - [place], [place] - [address] is requesting 90 day re-fill on gabapentin (NEURONTIN) 300 MG capsule

## 2014-01-06 ENCOUNTER — Telehealth: Payer: Self-pay | Admitting: Family Medicine

## 2014-01-06 NOTE — Telephone Encounter (Signed)
Pt wife called said when pt is taking  gabapentin (NEURONTIN) 300 MG capsule with tamsulosin (FLOMAX) 0.4 MG CAPS it makes him very dizzy when taking both at the same time  Wife is req that he goes back to gabapentin 100 mg capsule so he can adjust the dosage it message not clear please contact wife

## 2014-01-08 NOTE — Telephone Encounter (Signed)
May change gabapentin to 100 mg and instruct take as directed. He can take 1-2 capsules up to every 8 hours prn pain.

## 2014-01-09 MED ORDER — GABAPENTIN 100 MG PO CAPS: 100.0000 mg | ORAL_CAPSULE | Freq: Three times a day (TID) | ORAL | Status: DC

## 2014-01-09 NOTE — Telephone Encounter (Signed)
Pt notified, and rx for gabapentin changed to 100 mg tid

## 2014-01-10 DIAGNOSIS — H11159 Pinguecula, unspecified eye: Secondary | ICD-10-CM | POA: Diagnosis not present

## 2014-01-20 DIAGNOSIS — IMO0002 Reserved for concepts with insufficient information to code with codable children: Secondary | ICD-10-CM | POA: Diagnosis not present

## 2014-01-20 DIAGNOSIS — M48061 Spinal stenosis, lumbar region without neurogenic claudication: Secondary | ICD-10-CM | POA: Diagnosis not present

## 2014-01-24 ENCOUNTER — Other Ambulatory Visit: Payer: Self-pay | Admitting: Neurosurgery

## 2014-02-10 ENCOUNTER — Encounter (HOSPITAL_COMMUNITY): Payer: Self-pay | Admitting: Pharmacist

## 2014-02-13 NOTE — Pre-Procedure Instructions (Signed)
[name]  02/13/2014   Your procedure is scheduled on:  Wednesday, July 1st  Report to [facility] Admitting at 1:00PM.  Call this number if you have problems the morning of surgery: [phone]   Remember:   Do not eat food or drink liquids after midnight.   Take these medicines the morning of surgery with A SIP OF WATER: Nadolol, prilosec, flomax  Stop taking aspirin, OTC vitamins/herbal medications, NSAIDS (ibuprofen, advil) as of 6/24   Do not wear jewelry.  Do not wear lotions, powders, or perfumes. You may wear deodorant.  Do not shave 48 hours prior to surgery. Men may shave face and neck.  Do not bring valuables to the hospital.  [facility] is not responsible for any belongings or valuables.               Contacts, dentures or bridgework may not be worn into surgery.  Leave suitcase in the car. After surgery it may be brought to your room.  For patients admitted to the hospital, discharge time is determined by your treatment team.               Patients discharged the day of surgery will not be allowed to drive home.  Please read over the following fact sheets that you were given: Pain Booklet, Coughing and Deep Breathing, MRSA Information and Surgical Site Infection Prevention [place] - Preparing for Surgery  Before surgery, you can play an important role.  Because skin is not sterile, your skin needs to be as free of germs as possible.  You can reduce the number of germs on you skin by washing with CHG (chlorahexidine gluconate) soap before surgery.  CHG is an antiseptic cleaner which kills germs and bonds with the skin to continue killing germs even after washing.  Please DO NOT use if you have an allergy to CHG or antibacterial soaps.  If your skin becomes reddened/irritated stop using the CHG and inform your nurse when you arrive at Short Stay.  Do not shave (including legs and underarms) for at least 48 hours prior to the first CHG shower.  You  may shave your face.  Please follow these instructions carefully:   1.  Shower with CHG Soap the night before surgery and the morning of Surgery.  2.  If you choose to wash your hair, wash your hair first as usual with your normal shampoo.  3.  After you shampoo, rinse your hair and body thoroughly to remove the shampoo.  4.  Use CHG as you would any other liquid soap.  You can apply CHG directly to the skin and wash gently with scrungie or a clean washcloth.  5.  Apply the CHG Soap to your body ONLY FROM THE NECK DOWN.  Do not use on open wounds or open sores.  Avoid contact with your eyes, ears, mouth and genitals (private parts).  Wash genitals (private parts) with your normal soap.  6.  Wash thoroughly, paying special attention to the area where your surgery will be performed.  7.  Thoroughly rinse your body with warm water from the neck down.  8.  DO NOT shower/wash with your normal soap after using and rinsing off the CHG Soap.  9.  Pat yourself dry with a clean towel.            10.  Wear clean pajamas.            11.  Place clean  sheets on your bed the night of your first shower and do not sleep with pets.  Day of Surgery  Do not apply any lotions/deoderants the morning of surgery.  Please wear clean clothes to the hospital/surgery center.

## 2014-02-14 ENCOUNTER — Ambulatory Visit (HOSPITAL_COMMUNITY)
Admission: RE | Admit: 2014-02-14 | Discharge: 2014-02-14 | Disposition: A | Discharge status: Home / Self Care | Payer: Medicare Other | Source: Ambulatory Visit | Attending: Anesthesiology | Admitting: Anesthesiology

## 2014-02-14 ENCOUNTER — Encounter (HOSPITAL_COMMUNITY)
Admission: RE | Admit: 2014-02-14 | Discharge: 2014-02-14 | Disposition: A | Discharge status: Home / Self Care | Payer: Medicare Other | Source: Ambulatory Visit | Attending: Neurosurgery | Admitting: Neurosurgery

## 2014-02-14 ENCOUNTER — Encounter (HOSPITAL_COMMUNITY): Payer: Self-pay

## 2014-02-14 DIAGNOSIS — Z01812 Encounter for preprocedural laboratory examination: Secondary | ICD-10-CM | POA: Insufficient documentation

## 2014-02-14 DIAGNOSIS — Z01810 Encounter for preprocedural cardiovascular examination: Secondary | ICD-10-CM | POA: Insufficient documentation

## 2014-02-14 DIAGNOSIS — J984 Other disorders of lung: Secondary | ICD-10-CM | POA: Diagnosis not present

## 2014-02-14 DIAGNOSIS — Z01818 Encounter for other preprocedural examination: Secondary | ICD-10-CM | POA: Insufficient documentation

## 2014-02-14 HISTORY — DX: Unspecified osteoarthritis, unspecified site: M19.90

## 2014-02-14 HISTORY — DX: Cardiac arrhythmia, unspecified: I49.9

## 2014-02-14 LAB — BASIC METABOLIC PANEL
BUN: 30 mg/dL — AB (ref 6–23)
CALCIUM: 9.5 mg/dL (ref 8.4–10.5)
CO2: 25 mEq/L (ref 19–32)
Chloride: 103 mEq/L (ref 96–112)
Creatinine, Ser: 1.58 mg/dL — ABNORMAL HIGH (ref 0.50–1.35)
GFR calc Af Amer: 46 mL/min — ABNORMAL LOW (ref >90)
GFR, EST NON AFRICAN AMERICAN: 40 mL/min — AB (ref >90)
Glucose, Bld: 124 mg/dL — ABNORMAL HIGH (ref 70–99)
POTASSIUM: 4.5 meq/L (ref 3.7–5.3)
Sodium: 141 mEq/L (ref 137–147)

## 2014-02-14 LAB — SURGICAL PCR SCREEN
MRSA, PCR: NEGATIVE
Staphylococcus aureus: POSITIVE — AB

## 2014-02-14 LAB — CBC
HCT: 42.1 % (ref 39.0–52.0)
HEMOGLOBIN: 14.1 g/dL (ref 13.0–17.0)
MCH: 33.5 pg (ref 26.0–34.0)
MCHC: 33.5 g/dL (ref 30.0–36.0)
MCV: 100 fL (ref 78.0–100.0)
Platelets: 169 10*3/uL (ref 150–400)
RBC: 4.21 MIL/uL — ABNORMAL LOW (ref 4.22–5.81)
RDW: 12.9 % (ref 11.5–15.5)
WBC: 6.5 10*3/uL (ref 4.0–10.5)

## 2014-02-14 NOTE — Progress Notes (Signed)
Patient made aware that nasal swab was positive for staph. Script was called to [phone].Patient verbalized understanding of instructions.

## 2014-02-21 MED ORDER — VANCOMYCIN HCL IN DEXTROSE 1-5 GM/200ML-% IV SOLN
1000.0000 mg | INTRAVENOUS | Status: DC
  Filled 2014-02-21: qty 200

## 2014-02-22 ENCOUNTER — Encounter (HOSPITAL_COMMUNITY): Payer: Self-pay | Admitting: *Deleted

## 2014-02-22 ENCOUNTER — Encounter (HOSPITAL_COMMUNITY)
Admission: RE | Disposition: A | Discharge status: Home / Self Care | Payer: Self-pay | Source: Ambulatory Visit | Attending: Neurosurgery

## 2014-02-22 ENCOUNTER — Encounter (HOSPITAL_COMMUNITY): Payer: Self-pay | Admitting: Anesthesiology

## 2014-02-22 ENCOUNTER — Ambulatory Visit (HOSPITAL_COMMUNITY)
Admission: RE | Admit: 2014-02-22 | Discharge: 2014-02-22 | Disposition: A | Discharge status: Home / Self Care | Payer: Medicare Other | Source: Ambulatory Visit | Attending: Neurosurgery | Admitting: Neurosurgery

## 2014-02-22 DIAGNOSIS — M48061 Spinal stenosis, lumbar region without neurogenic claudication: Secondary | ICD-10-CM | POA: Insufficient documentation

## 2014-02-22 DIAGNOSIS — Z538 Procedure and treatment not carried out for other reasons: Secondary | ICD-10-CM | POA: Insufficient documentation

## 2014-02-22 LAB — POCT I-STAT, CHEM 8
BUN: 48 mg/dL — ABNORMAL HIGH (ref 6–23)
CALCIUM ION: 1.14 mmol/L (ref 1.13–1.30)
CHLORIDE: 104 meq/L (ref 96–112)
Creatinine, Ser: 2 mg/dL — ABNORMAL HIGH (ref 0.50–1.35)
Glucose, Bld: 104 mg/dL — ABNORMAL HIGH (ref 70–99)
HEMATOCRIT: 45 % (ref 39.0–52.0)
Hemoglobin: 15.3 g/dL (ref 13.0–17.0)
POTASSIUM: 4.3 meq/L (ref 3.7–5.3)
Sodium: 142 mEq/L (ref 137–147)
TCO2: 23 mmol/L (ref 0–100)

## 2014-02-22 SURGERY — CANCELLED PROCEDURE

## 2014-02-22 MED ORDER — LACTATED RINGERS IV SOLN
INTRAVENOUS | Status: DC
  Administered 2014-02-22: 12:00:00 via INTRAVENOUS

## 2014-02-22 SURGICAL SUPPLY — 54 items
APL SKNCLS STERI-STRIP NONHPOA (GAUZE/BANDAGES/DRESSINGS) ×1
BAG DECANTER FOR FLEXI CONT (MISCELLANEOUS) ×4 IMPLANT
BENZOIN TINCTURE PRP APPL 2/3 (GAUZE/BANDAGES/DRESSINGS) ×4 IMPLANT
BLADE 10 SAFETY STRL DISP (BLADE) ×4 IMPLANT
BLADE SURG ROTATE 9660 (MISCELLANEOUS) IMPLANT
BRUSH SCRUB EZ PLAIN DRY (MISCELLANEOUS) ×4 IMPLANT
BUR ACORN 6.0 (BURR) ×3 IMPLANT
BUR ACORN 6.0MM (BURR) ×1
BUR MATCHSTICK NEURO 3.0 LAGG (BURR) ×4 IMPLANT
CANISTER SUCT 3000ML (MISCELLANEOUS) ×4 IMPLANT
CLOSURE WOUND 1/2 X4 (GAUZE/BANDAGES/DRESSINGS) ×1
CONT SPEC 4OZ CLIKSEAL STRL BL (MISCELLANEOUS) ×4 IMPLANT
DRAPE LAPAROTOMY 100X72X124 (DRAPES) ×4 IMPLANT
DRAPE MICROSCOPE LEICA (MISCELLANEOUS) ×4 IMPLANT
DRAPE POUCH INSTRU U-SHP 10X18 (DRAPES) ×4 IMPLANT
DRAPE SURG 17X23 STRL (DRAPES) ×16 IMPLANT
ELECT BLADE 4.0 EZ CLEAN MEGAD (MISCELLANEOUS) ×3
ELECT REM PT RETURN 9FT ADLT (ELECTROSURGICAL) ×3
ELECTRODE BLDE 4.0 EZ CLN MEGD (MISCELLANEOUS) ×2 IMPLANT
ELECTRODE REM PT RTRN 9FT ADLT (ELECTROSURGICAL) ×2 IMPLANT
GAUZE SPONGE 4X4 16PLY XRAY LF (GAUZE/BANDAGES/DRESSINGS) IMPLANT
GLOVE BIO SURGEON STRL SZ8.5 (GLOVE) ×4 IMPLANT
GLOVE EXAM NITRILE LRG STRL (GLOVE) IMPLANT
GLOVE EXAM NITRILE MD LF STRL (GLOVE) IMPLANT
GLOVE EXAM NITRILE XL STR (GLOVE) IMPLANT
GLOVE EXAM NITRILE XS STR PU (GLOVE) IMPLANT
GLOVE SS BIOGEL STRL SZ 8 (GLOVE) ×2 IMPLANT
GLOVE SUPERSENSE BIOGEL SZ 8 (GLOVE) ×2
GOWN STRL REUS W/ TWL LRG LVL3 (GOWN DISPOSABLE) IMPLANT
GOWN STRL REUS W/ TWL XL LVL3 (GOWN DISPOSABLE) ×2 IMPLANT
GOWN STRL REUS W/TWL 2XL LVL3 (GOWN DISPOSABLE) IMPLANT
GOWN STRL REUS W/TWL LRG LVL3 (GOWN DISPOSABLE)
GOWN STRL REUS W/TWL XL LVL3 (GOWN DISPOSABLE) ×3
KIT BASIN OR (CUSTOM PROCEDURE TRAY) ×4 IMPLANT
KIT ROOM TURNOVER OR (KITS) ×4 IMPLANT
NDL HYPO 21X1.5 SAFETY (NEEDLE) IMPLANT
NEEDLE HYPO 21X1.5 SAFETY (NEEDLE) IMPLANT
NEEDLE HYPO 22GX1.5 SAFETY (NEEDLE) ×4 IMPLANT
NS IRRIG 1000ML POUR BTL (IV SOLUTION) ×4 IMPLANT
PACK LAMINECTOMY NEURO (CUSTOM PROCEDURE TRAY) ×4 IMPLANT
PAD ARMBOARD 7.5X6 YLW CONV (MISCELLANEOUS) ×12 IMPLANT
PATTIES SURGICAL .5 X1 (DISPOSABLE) IMPLANT
RUBBERBAND STERILE (MISCELLANEOUS) ×8 IMPLANT
SPONGE GAUZE 4X4 12PLY (GAUZE/BANDAGES/DRESSINGS) ×4 IMPLANT
SPONGE SURGIFOAM ABS GEL SZ50 (HEMOSTASIS) ×4 IMPLANT
STRIP CLOSURE SKIN 1/2X4 (GAUZE/BANDAGES/DRESSINGS) ×3 IMPLANT
SUT VIC AB 1 CT1 18XBRD ANBCTR (SUTURE) ×4 IMPLANT
SUT VIC AB 1 CT1 8-18 (SUTURE) ×6
SUT VIC AB 2-0 CP2 18 (SUTURE) ×8 IMPLANT
SYR 20CC LL (SYRINGE) IMPLANT
SYR 20ML ECCENTRIC (SYRINGE) ×4 IMPLANT
TOWEL OR 17X24 6PK STRL BLUE (TOWEL DISPOSABLE) ×4 IMPLANT
TOWEL OR 17X26 10 PK STRL BLUE (TOWEL DISPOSABLE) ×4 IMPLANT
WATER STERILE IRR 1000ML POUR (IV SOLUTION) ×4 IMPLANT

## 2014-02-22 NOTE — Anesthesia Preprocedure Evaluation (Deleted)
Anesthesia Evaluation  Patient identified by MRN, date of birth, ID band Patient awake    Reviewed: Allergy & Precautions, H&P , NPO status , Patient's Chart, lab work & pertinent test results  Airway Mallampati: II  Neck ROM: full    Dental   Pulmonary former smoker,          Cardiovascular hypertension, + dysrhythmias Atrial Fibrillation     Neuro/Psych    GI/Hepatic GERD-  ,  Endo/Other    Renal/GU      Musculoskeletal  (+) Arthritis -,   Abdominal   Peds  Hematology   Anesthesia Other Findings   Reproductive/Obstetrics                           Anesthesia Physical Anesthesia Plan  ASA: II  Anesthesia Plan: General   Post-op Pain Management:    Induction: Intravenous  Airway Management Planned: Oral ETT  Additional Equipment:   Intra-op Plan:   Post-operative Plan: Extubation in OR  Informed Consent: I have reviewed the patients History and Physical, chart, labs and discussed the procedure including the risks, benefits and alternatives for the proposed anesthesia with the patient or authorized representative who has indicated his/her understanding and acceptance.     Plan Discussed with: CRNA, Anesthesiologist and Surgeon  Anesthesia Plan Comments:         Anesthesia Quick Evaluation

## 2014-02-23 ENCOUNTER — Ambulatory Visit (INDEPENDENT_AMBULATORY_CARE_PROVIDER_SITE_OTHER): Payer: Medicare Other | Admitting: Family Medicine

## 2014-02-23 ENCOUNTER — Encounter: Payer: Self-pay | Admitting: Family Medicine

## 2014-02-23 VITALS — BP 118/64 | HR 54 | Temp 97.5°F | Wt 182.0 lb

## 2014-02-23 DIAGNOSIS — N179 Acute kidney failure, unspecified: Secondary | ICD-10-CM

## 2014-02-23 NOTE — Patient Instructions (Signed)
Avoid all nonsteroidal medications at this time-Advil, Motrin, Aleve Takes Flomax regularly one each day Drink plenty of fluids Schedule lab work for early next week We will call you regarding renal ultrasound and nephrology appointment

## 2014-02-23 NOTE — Progress Notes (Signed)
   Subjective:    Patient ID: [name], male    DOB: September 30, 1931, 80 y.o.   MRN: [id number]  HPI Patient seen with acute renal failure. He was preparing to have back surgery and went in for preoperative labs. Initial preoperative labs on 02/14/2014 creatinine 1.58. He then had repeat labs yesterday with creatinine 2.0 with BUN of 48. His baseline creatinine has been running around 1.3. Most recent labs for comparison was 09/30/13 with creatinine 1.3 and GFR 54.57. His GFR has been consistently ranging between 51 and 59 over the past 3 years.  Does have history of BPH. Does not take Flomax regularly. Occasional slow stream. No history of heart failure. No recent dehydration. Does use nonsteroidals fairly often but not during the past week or so. No recent change in medications. He does take losartan HCTZ for hypertension and has been on this for several years.  Lab Results  Component Value Date   CREATININE 2.00* 02/22/2014    Past Medical History  Diagnosis Date  . CARCINOMA, SKIN, SQUAMOUS CELL 09/11/2009  . HYPERLIPIDEMIA 03/13/2009  . INSOMNIA, CHRONIC 09/11/2009  . HYPERTENSION 03/13/2009  . Atrial fibrillation 04/07/2009  . PULMONARY NODULE 09/11/2009  . GERD 03/13/2009  . Rosacea 03/13/2009  . Actinic keratosis 03/13/2009  . SKIN CANCER, HX OF 03/13/2009  . COLONIC POLYPS, HX OF 03/13/2009  . BENIGN PROSTATIC HYPERTROPHY, HX OF 04/07/2009  . POLYPECTOMY, HX OF 04/07/2009  . Dysrhythmia     hx afib  . Arthritis    Past Surgical History  Procedure Laterality Date  . Lumbar disc surgery      RUPTURE  . Polypectomy    . Ablation  2010    x2  . Eye surgery Bilateral     cataracts  . Colonoscopy w/ biopsies and polypectomy      reports that he quit smoking about 25 years ago. His smoking use included Cigarettes. He has a 30 pack-year smoking history. His smokeless tobacco use includes Chew. He reports that he does not drink alcohol or use illicit drugs. family history includes  Cancer in his mother; Cancer (age of onset: 13) in his father; Heart disease in his brother and sister. Allergies  Allergen Reactions  . Amoxicillin-Pot Clavulanate Diarrhea and Nausea Only      Review of Systems  Constitutional: Negative for fever, chills, appetite change, fatigue and unexpected weight change.  Respiratory: Negative for cough and shortness of breath.   Cardiovascular: Negative for chest pain, palpitations and leg swelling.  Gastrointestinal: Negative for abdominal pain.  Genitourinary: Negative for dysuria and hematuria.  Musculoskeletal: Positive for back pain.       Objective:   Physical Exam  Constitutional: He is oriented to person, place, and time. He appears well-developed and well-nourished.  Neck: Neck supple. No thyromegaly present.  Cardiovascular: Normal rate and regular rhythm.   Pulmonary/Chest: Effort normal and breath sounds normal. No respiratory distress. He has no wheezes. He has no rales.  Abdominal: Soft. Bowel sounds are normal. He exhibits no mass. There is no tenderness.  Musculoskeletal: He exhibits no edema.  Neurological: He is alert and oriented to person, place, and time.          Assessment & Plan:  Acute renal failure. Doubt prerenal. Rule out post renal. He does have history of BPH. Set up renal ultrasound. Daily Flomax. Stay well hydrated. Avoid all nonsteroidals. Repeat basic metabolic panel early next week. Check urinalysis. Set up nephrology referral

## 2014-02-23 NOTE — Progress Notes (Signed)
Pre visit review using our clinic review tool, if applicable. No additional management support is needed unless otherwise documented below in the visit note. 

## 2014-02-24 LAB — URINALYSIS, ROUTINE W REFLEX MICROSCOPIC
Bilirubin Urine: NEGATIVE
Glucose, UA: NEGATIVE mg/dL
HGB URINE DIPSTICK: NEGATIVE
Ketones, ur: NEGATIVE mg/dL
LEUKOCYTES UA: NEGATIVE
NITRITE: NEGATIVE
PH: 5.5 (ref 5.0–8.0)
Protein, ur: NEGATIVE mg/dL
Specific Gravity, Urine: 1.015 (ref 1.005–1.030)
Urobilinogen, UA: 1 mg/dL (ref 0.0–1.0)

## 2014-02-28 ENCOUNTER — Ambulatory Visit
Admission: RE | Admit: 2014-02-28 | Discharge: 2014-02-28 | Disposition: A | Discharge status: Home / Self Care | Payer: PRIVATE HEALTH INSURANCE | Source: Ambulatory Visit | Attending: Family Medicine | Admitting: Family Medicine

## 2014-02-28 DIAGNOSIS — N179 Acute kidney failure, unspecified: Secondary | ICD-10-CM

## 2014-03-02 ENCOUNTER — Telehealth: Payer: Self-pay | Admitting: Family Medicine

## 2014-03-02 NOTE — Telephone Encounter (Signed)
Pt viewed lab work on Pharmacist, community and wife has ?

## 2014-03-02 NOTE — Telephone Encounter (Signed)
Left message for patient to return call.

## 2014-03-02 NOTE — Telephone Encounter (Signed)
Pt only needed a lab appt.

## 2014-03-03 ENCOUNTER — Other Ambulatory Visit (INDEPENDENT_AMBULATORY_CARE_PROVIDER_SITE_OTHER): Payer: Medicare Other

## 2014-03-03 ENCOUNTER — Other Ambulatory Visit: Payer: Self-pay | Admitting: Family Medicine

## 2014-03-03 DIAGNOSIS — N179 Acute kidney failure, unspecified: Secondary | ICD-10-CM

## 2014-03-03 LAB — BASIC METABOLIC PANEL
BUN: 26 mg/dL — ABNORMAL HIGH (ref 6–23)
CALCIUM: 9.7 mg/dL (ref 8.4–10.5)
CHLORIDE: 103 meq/L (ref 96–112)
CO2: 27 mEq/L (ref 19–32)
CREATININE: 1.4 mg/dL (ref 0.4–1.5)
GFR: 51.4 mL/min — ABNORMAL LOW (ref >60.00)
Glucose, Bld: 95 mg/dL (ref 70–99)
Potassium: 4.1 mEq/L (ref 3.5–5.1)
Sodium: 138 mEq/L (ref 135–145)

## 2014-03-07 ENCOUNTER — Other Ambulatory Visit: Payer: Self-pay | Admitting: Neurosurgery

## 2014-03-15 ENCOUNTER — Telehealth: Payer: Self-pay | Admitting: Family Medicine

## 2014-03-15 NOTE — Telephone Encounter (Signed)
Pt informed

## 2014-03-15 NOTE — Telephone Encounter (Signed)
I think he can: 1) cancel appt with nephrologist 2) OK to get his surgery.

## 2014-03-15 NOTE — Telephone Encounter (Signed)
Pt is needing to know from dr. Elease Hashimoto if he is aware that an appt has been scheduled with dr. Otis Peak on 8/3, however surgery is 8/6 and pre-op is 7/30. Pt states per last conversation with dr. Elease Hashimoto he stated that if all the labs came back good. Pt would not have to see dr. Lyda Kalata so pt need to know if he should keep the appt or if it can be cancelled. Leave msg if no one home.

## 2014-03-17 ENCOUNTER — Encounter (HOSPITAL_COMMUNITY): Payer: Self-pay | Admitting: Pharmacy Technician

## 2014-03-17 DIAGNOSIS — L819 Disorder of pigmentation, unspecified: Secondary | ICD-10-CM | POA: Diagnosis not present

## 2014-03-17 DIAGNOSIS — L821 Other seborrheic keratosis: Secondary | ICD-10-CM | POA: Diagnosis not present

## 2014-03-17 DIAGNOSIS — Z85820 Personal history of malignant melanoma of skin: Secondary | ICD-10-CM | POA: Diagnosis not present

## 2014-03-17 DIAGNOSIS — Z85828 Personal history of other malignant neoplasm of skin: Secondary | ICD-10-CM | POA: Diagnosis not present

## 2014-03-17 DIAGNOSIS — L570 Actinic keratosis: Secondary | ICD-10-CM | POA: Diagnosis not present

## 2014-03-23 ENCOUNTER — Encounter (HOSPITAL_COMMUNITY)
Admission: RE | Admit: 2014-03-23 | Discharge: 2014-03-23 | Disposition: A | Discharge status: Home / Self Care | Payer: Medicare Other | Source: Ambulatory Visit | Attending: Neurosurgery | Admitting: Neurosurgery

## 2014-03-23 ENCOUNTER — Encounter (HOSPITAL_COMMUNITY): Payer: Self-pay

## 2014-03-23 DIAGNOSIS — Z01818 Encounter for other preprocedural examination: Secondary | ICD-10-CM | POA: Diagnosis not present

## 2014-03-23 DIAGNOSIS — Z01812 Encounter for preprocedural laboratory examination: Secondary | ICD-10-CM | POA: Diagnosis not present

## 2014-03-23 LAB — CBC
HCT: 42.5 % (ref 39.0–52.0)
Hemoglobin: 14.3 g/dL (ref 13.0–17.0)
MCH: 33.6 pg (ref 26.0–34.0)
MCHC: 33.6 g/dL (ref 30.0–36.0)
MCV: 100 fL (ref 78.0–100.0)
Platelets: 176 10*3/uL (ref 150–400)
RBC: 4.25 MIL/uL (ref 4.22–5.81)
RDW: 12.5 % (ref 11.5–15.5)
WBC: 6.2 10*3/uL (ref 4.0–10.5)

## 2014-03-23 LAB — SURGICAL PCR SCREEN
MRSA, PCR: NEGATIVE
STAPHYLOCOCCUS AUREUS: NEGATIVE

## 2014-03-23 LAB — BASIC METABOLIC PANEL
Anion gap: 13 (ref 5–15)
BUN: 35 mg/dL — ABNORMAL HIGH (ref 6–23)
CALCIUM: 9.3 mg/dL (ref 8.4–10.5)
CHLORIDE: 104 meq/L (ref 96–112)
CO2: 24 mEq/L (ref 19–32)
CREATININE: 1.79 mg/dL — AB (ref 0.50–1.35)
GFR calc non Af Amer: 34 mL/min — ABNORMAL LOW (ref >90)
GFR, EST AFRICAN AMERICAN: 40 mL/min — AB (ref >90)
Glucose, Bld: 100 mg/dL — ABNORMAL HIGH (ref 70–99)
Potassium: 4.5 mEq/L (ref 3.7–5.3)
Sodium: 141 mEq/L (ref 137–147)

## 2014-03-23 NOTE — Pre-Procedure Instructions (Signed)
[name]  03/23/2014   Your procedure is scheduled on:  Thursday August 6 th at [place] AM  Report to [facility] Admitting at 0730 AM.  Call this number if you have problems the morning of surgery: [phone]   Remember:   Do not eat food or drink liquids after midnight.   Take these medicines the morning of surgery with A SIP OF WATER: Gabapentin(Neurontin), Nadolol(Corgard) and Omeprazole(Prilosec)  Stop Aspirin, Vitamins, Nsaids(Ibuprofen, Advil, Aleve), herbal meds 7 days prior to surgery.   Do not wear jewelry.  Do not wear lotions, powders, or colognes. You may wear deodorant.   Men may shave face and neck.  Do not bring valuables to the hospital.  [facility] is not responsible  for any belongings or valuables.               Contacts, dentures or bridgework may not be worn into surgery.  Leave suitcase in the car. After surgery it may be brought to your room.  For patients admitted to the hospital, discharge time is determined by your  treatment team.               Patients discharged the day of surgery will not be allowed to drive home.    Special Instructions: [name] - Preparing for Surgery  Before surgery, you can play an important role.  Because skin is not sterile, your skin needs to be as free of germs as possible.  You can reduce the number of germs on you skin by washing with CHG (chlorahexidine gluconate) soap before surgery.  CHG is an antiseptic cleaner which kills germs and bonds with the skin to continue killing germs even after washing.  Please DO NOT use if you have an allergy to CHG or antibacterial soaps.  If your skin becomes reddened/irritated stop using the CHG and inform your nurse when you arrive at Short Stay.  Do not shave (including legs and underarms) for at least 48 hours prior to the first CHG shower.  You may shave your face.  Please follow these instructions carefully:   1.  Shower with CHG Soap the night before surgery  and the                                morning of Surgery.  2.  If you choose to wash your hair, wash your hair first as usual with your       normal shampoo.  3.  After you shampoo, rinse your hair and body thoroughly to remove the                      Shampoo.  4.  Use CHG as you would any other liquid soap.  You can apply chg directly       to the skin and wash gently with scrungie or a clean washcloth.  5.  Apply the CHG Soap to your body ONLY FROM THE NECK DOWN.        Do not use on open wounds or open sores.  Avoid contact with your eyes,       ears, mouth and genitals (private parts).  Wash genitals (private parts)       with your normal soap.  6.  Wash thoroughly, paying special attention to the area where your surgery        will be performed.  7.  Thoroughly rinse your body with warm water from the neck down.  8.  DO NOT shower/wash with your normal soap after using and rinsing off       the CHG Soap.  9.  Pat yourself dry with a clean towel.            10.  Wear clean pajamas.            11.  Place clean sheets on your bed the night of your first shower and do not        sleep with pets.  Day of Surgery  Do not apply any lotions/deoderants the morning of surgery.  Please wear clean clothes to the hospital/surgery center.      Please read over the following fact sheets that you were given: Pain Booklet, Coughing and Deep Breathing, Blood Transfusion Information, MRSA Information and Surgical Site Infection Prevention

## 2014-03-24 ENCOUNTER — Encounter (HOSPITAL_COMMUNITY): Payer: Self-pay | Admitting: Vascular Surgery

## 2014-03-24 ENCOUNTER — Telehealth: Payer: Self-pay

## 2014-03-24 NOTE — Telephone Encounter (Signed)
i recommend that we go ahead and have him see nephrology.

## 2014-03-24 NOTE — Telephone Encounter (Signed)
Called and left a message for [place].  Pt is suppose to have appt on 03/31/14 but sugery is 03/30/14

## 2014-03-24 NOTE — Telephone Encounter (Signed)
Spoke with [name] and she will speak with surge ron to see if pt needs to be seen before or after surgery.

## 2014-03-24 NOTE — Telephone Encounter (Signed)
Pt had pre-op labs yesterday and his creatine was elevated again.  Pls advise on labs and if pt should surgery on 03/30/14 or if pt should follow up with nephrology.  Pls advise.  Ebony Hail can review in Epic or be reached at the number listed above.

## 2014-03-24 NOTE — Progress Notes (Signed)
Anesthesia Chart Review:  Patient is a 80 year old male scheduled for L2-3, L3-4, L4-5 laminectomies on 03/30/14 by Dr. Arnoldo Morale. Surgery was initially scheduled for 03/30/14, but was canceled due to acute renal failure (Cr up to 2.0 from his baseline of 1.3.) Patient was going to be referred to nephrology, but his repeat Cr on 03/03/14 was back down to 1.4 and his appointment was canceled. Dr. Elease Hashimoto had cleared patient for surgery based on his 03/03/14 labs.    History includes former smoker, HTN, HLD, afib '08 s/p ablation '10 (Dr. Caryl Comes), GERD, skin cancer, BPH, arthritis, insomnia, rosacea, lumbar disc surgery. PCP is Dr. Carolann Littler.  EKG on 03/16/14 showed: SB at 52 bpm.  Echo on 04/06/07 showed:  - Overall left ventricular systolic function was normal. Left ventricular ejection fraction was estimated , range being 55 % to 60 %. There were no left ventricular regional wall motion abnormalities. - There was mild mitral annular calcification.  CXR on 02/14/14 showed: Heart size and pulmonary vascularity are normal. There is scarring in the lingula, essentially unchanged since prior exam. No infiltrates or effusions. Tiny granuloma at the right lung base laterally, also unchanged. No osseous abnormality. No acute abnormality.  Preoperative labs noted. BUN/Cr now back up to 35/1.79.  I reviewed with anesthesiologist Dr. Marcie Bal who agreed with deferring back to Dr. Elease Hashimoto.  Dr. Elease Hashimoto reviewed and recommended going a head with nephrology referral with [facility].  He had an appointment for 03/27/14, but unfortunately, someone had called an already canceled the appointment.  I notified Manuela Schwartz at Dr. Adline Mango office, and she will try to get an appointment rescheduled asap.    Proceeding with surgery as planned will depend on nephrology input.  George Hugh [facility] Short Stay Center/Anesthesiology Phone [phone] 03/24/2014 4:17 PM

## 2014-03-30 ENCOUNTER — Inpatient Hospital Stay (HOSPITAL_COMMUNITY): Admission: RE | Admit: 2014-03-30 | Payer: Medicare Other | Source: Ambulatory Visit | Admitting: Neurosurgery

## 2014-03-30 ENCOUNTER — Encounter (HOSPITAL_COMMUNITY): Admission: RE | Payer: Self-pay | Source: Ambulatory Visit

## 2014-03-30 SURGERY — LUMBAR LAMINECTOMY/DECOMPRESSION MICRODISCECTOMY 3 LEVELS
Anesthesia: General

## 2014-04-03 DIAGNOSIS — I129 Hypertensive chronic kidney disease with stage 1 through stage 4 chronic kidney disease, or unspecified chronic kidney disease: Secondary | ICD-10-CM | POA: Diagnosis not present

## 2014-04-03 DIAGNOSIS — N183 Chronic kidney disease, stage 3 unspecified: Secondary | ICD-10-CM | POA: Diagnosis not present

## 2014-04-03 DIAGNOSIS — N179 Acute kidney failure, unspecified: Secondary | ICD-10-CM | POA: Diagnosis not present

## 2014-04-03 DIAGNOSIS — E875 Hyperkalemia: Secondary | ICD-10-CM | POA: Diagnosis not present

## 2014-04-06 DIAGNOSIS — E875 Hyperkalemia: Secondary | ICD-10-CM | POA: Diagnosis not present

## 2014-04-10 DIAGNOSIS — N183 Chronic kidney disease, stage 3 unspecified: Secondary | ICD-10-CM | POA: Diagnosis not present

## 2014-04-11 ENCOUNTER — Other Ambulatory Visit: Payer: Self-pay | Admitting: Neurosurgery

## 2014-04-12 NOTE — Progress Notes (Addendum)
Anesthesia follow-up:  See my note from 03/24/14.  Procedure has now been rescheduled for 04/20/14.  His surgery was cancelled twice in July 2015 due to acute changes in his his renal function.  He has since been evaluated by nephrologist Dr. Corliss Parish who cleared him for surgery from her standpoint following improvement in his Cr to 1.28 after discontinuing ARB therapy (losartan).  He has a PAT appointment scheduled for 04/14/14 with repeat labs at that time.  [name] [facility] Short Stay Center/Anesthesiology Phone [phone] 04/12/2014 5:15 PM  Addendum:  Pt seen in PAT for labs 04/14/14. Cr is 1.34.   If no changes, I anticipate pt can proceed with surgery as scheduled.   Willeen Cass, FNP-BC [facility] Short Stay Surgical Center/Anesthesiology Phone: [phone] 04/17/2014 2:14 PM

## 2014-04-14 ENCOUNTER — Encounter (HOSPITAL_COMMUNITY)
Admission: RE | Admit: 2014-04-14 | Discharge: 2014-04-14 | Disposition: A | Discharge status: Home / Self Care | Payer: Medicare Other | Source: Ambulatory Visit | Attending: Neurosurgery | Admitting: Neurosurgery

## 2014-04-14 ENCOUNTER — Encounter (HOSPITAL_COMMUNITY): Payer: Self-pay

## 2014-04-14 DIAGNOSIS — IMO0002 Reserved for concepts with insufficient information to code with codable children: Secondary | ICD-10-CM | POA: Insufficient documentation

## 2014-04-14 DIAGNOSIS — M545 Low back pain, unspecified: Secondary | ICD-10-CM | POA: Insufficient documentation

## 2014-04-14 DIAGNOSIS — M48062 Spinal stenosis, lumbar region with neurogenic claudication: Secondary | ICD-10-CM | POA: Diagnosis not present

## 2014-04-14 DIAGNOSIS — Z01818 Encounter for other preprocedural examination: Secondary | ICD-10-CM | POA: Diagnosis not present

## 2014-04-14 LAB — CBC
HEMATOCRIT: 40.6 % (ref 39.0–52.0)
Hemoglobin: 13.8 g/dL (ref 13.0–17.0)
MCH: 33.8 pg (ref 26.0–34.0)
MCHC: 34 g/dL (ref 30.0–36.0)
MCV: 99.5 fL (ref 78.0–100.0)
Platelets: 152 10*3/uL (ref 150–400)
RBC: 4.08 MIL/uL — ABNORMAL LOW (ref 4.22–5.81)
RDW: 12.6 % (ref 11.5–15.5)
WBC: 4.9 10*3/uL (ref 4.0–10.5)

## 2014-04-14 LAB — BASIC METABOLIC PANEL
Anion gap: 15 (ref 5–15)
BUN: 16 mg/dL (ref 6–23)
CALCIUM: 9 mg/dL (ref 8.4–10.5)
CO2: 22 mEq/L (ref 19–32)
Chloride: 105 mEq/L (ref 96–112)
Creatinine, Ser: 1.34 mg/dL (ref 0.50–1.35)
GFR calc Af Amer: 56 mL/min — ABNORMAL LOW (ref >90)
GFR calc non Af Amer: 48 mL/min — ABNORMAL LOW (ref >90)
Glucose, Bld: 172 mg/dL — ABNORMAL HIGH (ref 70–99)
Potassium: 3.8 mEq/L (ref 3.7–5.3)
SODIUM: 142 meq/L (ref 137–147)

## 2014-04-14 NOTE — Pre-Procedure Instructions (Signed)
BRAYLEE BOSHER  04/14/2014   Your procedure is scheduled on:  August 27  Report to [facility] Admitting at 08:15 AM.  Call this number if you have problems the morning of surgery: [phone]   Remember:   Do not eat food or drink liquids after midnight.   Take these medicines the morning of surgery with A SIP OF WATER: Nadolol, Omeprazole, Flomax   STOP Vitamin B12, Vitamin D, Aspirin today   STOP/ Do not take Aspirin, Aleve, Naproxen, Advil, Ibuprofen, Motrin, Vitamins, Herbs, or Supplements starting today   Do not wear jewelry, make-up or nail polish.  Do not wear lotions, powders, or perfumes. You may wear deodorant.  Do not shave 48 hours prior to surgery. Men may shave face and neck.  Do not bring valuables to the hospital.  [facility] is not responsible for any belongings or valuables.               Contacts, dentures or bridgework may not be worn into surgery.  Leave suitcase in the car. After surgery it may be brought to your room.  For patients admitted to the hospital, discharge time is determined by your treatment team.               Special Instructions: See [facility] Preparing For Surgery   Please read over the following fact sheets that you were given: Pain Booklet, Coughing and Deep Breathing and Surgical Site Infection Prevention

## 2014-04-14 NOTE — Progress Notes (Signed)
Left message with Blima Ledger and requested that orders be signed.

## 2014-04-19 MED ORDER — VANCOMYCIN HCL IN DEXTROSE 1-5 GM/200ML-% IV SOLN
1000.0000 mg | INTRAVENOUS | Status: AC
  Administered 2014-04-20: 1000 mg via INTRAVENOUS
  Filled 2014-04-19: qty 200

## 2014-04-20 ENCOUNTER — Inpatient Hospital Stay (HOSPITAL_COMMUNITY): Payer: Medicare Other

## 2014-04-20 ENCOUNTER — Inpatient Hospital Stay (HOSPITAL_COMMUNITY): Payer: Medicare Other | Admitting: Certified Registered"

## 2014-04-20 ENCOUNTER — Encounter (HOSPITAL_COMMUNITY): Payer: Medicare Other | Admitting: Vascular Surgery

## 2014-04-20 ENCOUNTER — Encounter (HOSPITAL_COMMUNITY): Payer: Self-pay | Admitting: *Deleted

## 2014-04-20 ENCOUNTER — Inpatient Hospital Stay (HOSPITAL_COMMUNITY)
Admission: RE | Admit: 2014-04-20 | Discharge: 2014-04-22 | DRG: 520 | Disposition: A | Discharge status: Home / Self Care | Payer: Medicare Other | Source: Ambulatory Visit | Attending: Neurosurgery | Admitting: Neurosurgery

## 2014-04-20 ENCOUNTER — Encounter (HOSPITAL_COMMUNITY)
Admission: RE | Disposition: A | Discharge status: Home / Self Care | Payer: Self-pay | Source: Ambulatory Visit | Attending: Neurosurgery

## 2014-04-20 DIAGNOSIS — K219 Gastro-esophageal reflux disease without esophagitis: Secondary | ICD-10-CM | POA: Diagnosis present

## 2014-04-20 DIAGNOSIS — Z7982 Long term (current) use of aspirin: Secondary | ICD-10-CM | POA: Diagnosis not present

## 2014-04-20 DIAGNOSIS — I10 Essential (primary) hypertension: Secondary | ICD-10-CM | POA: Diagnosis present

## 2014-04-20 DIAGNOSIS — G4700 Insomnia, unspecified: Secondary | ICD-10-CM | POA: Diagnosis present

## 2014-04-20 DIAGNOSIS — Z860100 Personal history of colon polyps, unspecified: Secondary | ICD-10-CM

## 2014-04-20 DIAGNOSIS — N400 Enlarged prostate without lower urinary tract symptoms: Secondary | ICD-10-CM | POA: Diagnosis present

## 2014-04-20 DIAGNOSIS — M1990 Unspecified osteoarthritis, unspecified site: Secondary | ICD-10-CM | POA: Diagnosis present

## 2014-04-20 DIAGNOSIS — E785 Hyperlipidemia, unspecified: Secondary | ICD-10-CM | POA: Diagnosis present

## 2014-04-20 DIAGNOSIS — I4891 Unspecified atrial fibrillation: Secondary | ICD-10-CM | POA: Diagnosis present

## 2014-04-20 DIAGNOSIS — M48061 Spinal stenosis, lumbar region without neurogenic claudication: Secondary | ICD-10-CM | POA: Diagnosis not present

## 2014-04-20 DIAGNOSIS — Z85828 Personal history of other malignant neoplasm of skin: Secondary | ICD-10-CM | POA: Diagnosis not present

## 2014-04-20 DIAGNOSIS — IMO0002 Reserved for concepts with insufficient information to code with codable children: Secondary | ICD-10-CM | POA: Diagnosis not present

## 2014-04-20 DIAGNOSIS — M5137 Other intervertebral disc degeneration, lumbosacral region: Secondary | ICD-10-CM | POA: Diagnosis not present

## 2014-04-20 DIAGNOSIS — Z87891 Personal history of nicotine dependence: Secondary | ICD-10-CM | POA: Diagnosis not present

## 2014-04-20 DIAGNOSIS — M48062 Spinal stenosis, lumbar region with neurogenic claudication: Principal | ICD-10-CM | POA: Diagnosis present

## 2014-04-20 HISTORY — PX: LUMBAR LAMINECTOMY/DECOMPRESSION MICRODISCECTOMY: SHX5026

## 2014-04-20 SURGERY — LUMBAR LAMINECTOMY/DECOMPRESSION MICRODISCECTOMY 3 LEVELS
Anesthesia: General | Site: Spine Lumbar

## 2014-04-20 MED ORDER — LACTATED RINGERS IV SOLN
INTRAVENOUS | Status: DC
  Administered 2014-04-20 (×2): via INTRAVENOUS

## 2014-04-20 MED ORDER — DIAZEPAM 5 MG PO TABS
5.0000 mg | ORAL_TABLET | Freq: Four times a day (QID) | ORAL | Status: DC | PRN
  Administered 2014-04-20 – 2014-04-21 (×2): 5 mg via ORAL
  Filled 2014-04-20 (×2): qty 1

## 2014-04-20 MED ORDER — ROCURONIUM BROMIDE 50 MG/5ML IV SOLN
INTRAVENOUS | Status: AC
  Filled 2014-04-20: qty 1

## 2014-04-20 MED ORDER — VANCOMYCIN HCL IN DEXTROSE 1-5 GM/200ML-% IV SOLN: 1000.0000 mg | INTRAVENOUS | Status: DC

## 2014-04-20 MED ORDER — LACTATED RINGERS IV SOLN
INTRAVENOUS | Status: DC | PRN
  Administered 2014-04-20 (×2): via INTRAVENOUS

## 2014-04-20 MED ORDER — PROPOFOL 10 MG/ML IV BOLUS
INTRAVENOUS | Status: DC | PRN
  Administered 2014-04-20: 140 mg via INTRAVENOUS

## 2014-04-20 MED ORDER — BACITRACIN ZINC 500 UNIT/GM EX OINT
TOPICAL_OINTMENT | CUTANEOUS | Status: DC | PRN
  Administered 2014-04-20: 1 via TOPICAL

## 2014-04-20 MED ORDER — ACETAMINOPHEN 650 MG RE SUPP: 650.0000 mg | RECTAL | Status: DC | PRN

## 2014-04-20 MED ORDER — ROCURONIUM BROMIDE 100 MG/10ML IV SOLN
INTRAVENOUS | Status: DC | PRN
  Administered 2014-04-20: 40 mg via INTRAVENOUS

## 2014-04-20 MED ORDER — SIMVASTATIN 5 MG PO TABS
10.0000 mg | ORAL_TABLET | Freq: Every day | ORAL | Status: DC
Start: 2014-04-20 — End: 2014-04-22
  Administered 2014-04-20 – 2014-04-21 (×2): 10 mg via ORAL
  Filled 2014-04-20 (×2): qty 2

## 2014-04-20 MED ORDER — THROMBIN 5000 UNITS EX SOLR
CUTANEOUS | Status: DC | PRN
  Administered 2014-04-20 (×2): 5000 [IU] via TOPICAL

## 2014-04-20 MED ORDER — NEOSTIGMINE METHYLSULFATE 10 MG/10ML IV SOLN
INTRAVENOUS | Status: AC
  Filled 2014-04-20: qty 1

## 2014-04-20 MED ORDER — BUPIVACAINE-EPINEPHRINE (PF) 0.5% -1:200000 IJ SOLN
INTRAMUSCULAR | Status: DC | PRN
  Administered 2014-04-20: 10 mL

## 2014-04-20 MED ORDER — PHENYLEPHRINE HCL 10 MG/ML IJ SOLN
INTRAMUSCULAR | Status: DC | PRN
  Administered 2014-04-20: 80 ug via INTRAVENOUS
  Administered 2014-04-20: 40 ug via INTRAVENOUS

## 2014-04-20 MED ORDER — FENTANYL CITRATE 0.05 MG/ML IJ SOLN
INTRAMUSCULAR | Status: DC | PRN
  Administered 2014-04-20: 50 ug via INTRAVENOUS
  Administered 2014-04-20: 150 ug via INTRAVENOUS
  Administered 2014-04-20: 50 ug via INTRAVENOUS

## 2014-04-20 MED ORDER — GABAPENTIN 100 MG PO CAPS
200.0000 mg | ORAL_CAPSULE | Freq: Every day | ORAL | Status: DC
  Administered 2014-04-20 – 2014-04-21 (×2): 200 mg via ORAL
  Filled 2014-04-20 (×2): qty 2

## 2014-04-20 MED ORDER — NEOSTIGMINE METHYLSULFATE 10 MG/10ML IV SOLN
INTRAVENOUS | Status: DC | PRN
  Administered 2014-04-20: 4 mg via INTRAVENOUS

## 2014-04-20 MED ORDER — HYDROCODONE-ACETAMINOPHEN 5-325 MG PO TABS: 1.0000 | ORAL_TABLET | ORAL | Status: DC | PRN

## 2014-04-20 MED ORDER — ONDANSETRON HCL 4 MG/2ML IJ SOLN
INTRAMUSCULAR | Status: DC | PRN
  Administered 2014-04-20: 4 mg via INTRAVENOUS

## 2014-04-20 MED ORDER — HEMOSTATIC AGENTS (NO CHARGE) OPTIME
TOPICAL | Status: DC | PRN
  Administered 2014-04-20: 1 via TOPICAL

## 2014-04-20 MED ORDER — ALUM & MAG HYDROXIDE-SIMETH 200-200-20 MG/5ML PO SUSP
30.0000 mL | Freq: Four times a day (QID) | ORAL | Status: DC | PRN
  Administered 2014-04-21: 30 mL via ORAL
  Filled 2014-04-20: qty 30

## 2014-04-20 MED ORDER — TAMSULOSIN HCL 0.4 MG PO CAPS
0.4000 mg | ORAL_CAPSULE | Freq: Every day | ORAL | Status: DC
  Administered 2014-04-20 – 2014-04-22 (×3): 0.4 mg via ORAL
  Filled 2014-04-20 (×3): qty 1

## 2014-04-20 MED ORDER — DOCUSATE SODIUM 100 MG PO CAPS
100.0000 mg | ORAL_CAPSULE | Freq: Two times a day (BID) | ORAL | Status: DC
  Administered 2014-04-20 – 2014-04-22 (×4): 100 mg via ORAL
  Filled 2014-04-20 (×4): qty 1

## 2014-04-20 MED ORDER — FENTANYL CITRATE 0.05 MG/ML IJ SOLN
INTRAMUSCULAR | Status: AC
  Filled 2014-04-20: qty 2

## 2014-04-20 MED ORDER — 0.9 % SODIUM CHLORIDE (POUR BTL) OPTIME
TOPICAL | Status: DC | PRN
  Administered 2014-04-20: 1000 mL

## 2014-04-20 MED ORDER — PANTOPRAZOLE SODIUM 40 MG PO TBEC
40.0000 mg | DELAYED_RELEASE_TABLET | Freq: Every day | ORAL | Status: DC
Start: 2014-04-20 — End: 2014-04-22
  Administered 2014-04-20 – 2014-04-22 (×3): 40 mg via ORAL
  Filled 2014-04-20 (×3): qty 1

## 2014-04-20 MED ORDER — FENTANYL CITRATE 0.05 MG/ML IJ SOLN
INTRAMUSCULAR | Status: AC
  Filled 2014-04-20: qty 5

## 2014-04-20 MED ORDER — GLYCOPYRROLATE 0.2 MG/ML IJ SOLN
INTRAMUSCULAR | Status: AC
Start: 2014-04-20 — End: 2014-04-20
  Filled 2014-04-20: qty 3

## 2014-04-20 MED ORDER — CEFAZOLIN SODIUM-DEXTROSE 2-3 GM-% IV SOLR
2.0000 g | Freq: Three times a day (TID) | INTRAVENOUS | Status: AC
  Administered 2014-04-20 – 2014-04-21 (×2): 2 g via INTRAVENOUS
  Filled 2014-04-20 (×2): qty 50

## 2014-04-20 MED ORDER — NADOLOL 20 MG PO TABS
20.0000 mg | ORAL_TABLET | Freq: Every day | ORAL | Status: DC
  Administered 2014-04-21 – 2014-04-22 (×2): 20 mg via ORAL
  Filled 2014-04-20 (×3): qty 1

## 2014-04-20 MED ORDER — BUPIVACAINE LIPOSOME 1.3 % IJ SUSP
INTRAMUSCULAR | Status: DC | PRN
  Administered 2014-04-20: 20 mL

## 2014-04-20 MED ORDER — GLYCOPYRROLATE 0.2 MG/ML IJ SOLN
INTRAMUSCULAR | Status: DC | PRN
  Administered 2014-04-20: 0.6 mg via INTRAVENOUS
  Administered 2014-04-20 (×2): 0.2 mg via INTRAVENOUS

## 2014-04-20 MED ORDER — MENTHOL 3 MG MT LOZG
1.0000 | LOZENGE | OROMUCOSAL | Status: DC | PRN
  Administered 2014-04-20: 3 mg via ORAL
  Filled 2014-04-20: qty 9

## 2014-04-20 MED ORDER — LIDOCAINE HCL (CARDIAC) 20 MG/ML IV SOLN
INTRAVENOUS | Status: DC | PRN
  Administered 2014-04-20: 40 mg via INTRAVENOUS

## 2014-04-20 MED ORDER — FENTANYL CITRATE 0.05 MG/ML IJ SOLN
25.0000 ug | INTRAMUSCULAR | Status: DC | PRN
  Administered 2014-04-20 (×3): 50 ug via INTRAVENOUS

## 2014-04-20 MED ORDER — PHENYLEPHRINE HCL 10 MG/ML IJ SOLN
10.0000 mg | INTRAVENOUS | Status: DC | PRN
  Administered 2014-04-20: 10 ug/min via INTRAVENOUS

## 2014-04-20 MED ORDER — SODIUM CHLORIDE 0.9 % IR SOLN
Status: DC | PRN
  Administered 2014-04-20: 13:00:00

## 2014-04-20 MED ORDER — OXYCODONE-ACETAMINOPHEN 5-325 MG PO TABS
1.0000 | ORAL_TABLET | ORAL | Status: DC | PRN
  Administered 2014-04-20 – 2014-04-22 (×4): 2 via ORAL
  Filled 2014-04-20 (×4): qty 2

## 2014-04-20 MED ORDER — ACETAMINOPHEN 325 MG PO TABS: 650.0000 mg | ORAL_TABLET | ORAL | Status: DC | PRN

## 2014-04-20 MED ORDER — PROPOFOL 10 MG/ML IV BOLUS
INTRAVENOUS | Status: AC
  Filled 2014-04-20: qty 20

## 2014-04-20 MED ORDER — PHENOL 1.4 % MT LIQD
1.0000 | OROMUCOSAL | Status: DC | PRN
  Administered 2014-04-21: 1 via OROMUCOSAL
  Filled 2014-04-20: qty 177

## 2014-04-20 MED ORDER — LACTATED RINGERS IV SOLN
INTRAVENOUS | Status: DC
Start: 2014-04-20 — End: 2014-04-22
  Administered 2014-04-20: 09:00:00 via INTRAVENOUS

## 2014-04-20 MED ORDER — BUPIVACAINE LIPOSOME 1.3 % IJ SUSP
20.0000 mL | INTRAMUSCULAR | Status: AC
Start: 2014-04-20 — End: 2014-04-21
  Filled 2014-04-20: qty 20

## 2014-04-20 MED ORDER — ONDANSETRON HCL 4 MG/2ML IJ SOLN: 4.0000 mg | INTRAMUSCULAR | Status: DC | PRN

## 2014-04-20 MED ORDER — MORPHINE SULFATE 2 MG/ML IJ SOLN: 1.0000 mg | INTRAMUSCULAR | Status: DC | PRN

## 2014-04-20 SURGICAL SUPPLY — 62 items
APL SKNCLS STERI-STRIP NONHPOA (GAUZE/BANDAGES/DRESSINGS) ×1
BAG DECANTER FOR FLEXI CONT (MISCELLANEOUS) ×3 IMPLANT
BENZOIN TINCTURE PRP APPL 2/3 (GAUZE/BANDAGES/DRESSINGS) ×3 IMPLANT
BLADE SURG ROTATE 9660 (MISCELLANEOUS) IMPLANT
BRUSH SCRUB EZ PLAIN DRY (MISCELLANEOUS) ×3 IMPLANT
BUR ACORN 6.0 (BURR) ×3 IMPLANT
BUR ACORN 6.0MM (BURR) ×2
BUR MATCHSTICK NEURO 3.0 LAGG (BURR) ×3 IMPLANT
CANISTER SUCT 3000ML (MISCELLANEOUS) ×3 IMPLANT
CLOSURE WOUND 1/2 X4 (GAUZE/BANDAGES/DRESSINGS) ×1
CONT SPEC 4OZ CLIKSEAL STRL BL (MISCELLANEOUS) ×3 IMPLANT
DRAPE LAPAROTOMY 100X72X124 (DRAPES) ×3 IMPLANT
DRAPE MICROSCOPE LEICA (MISCELLANEOUS) ×3 IMPLANT
DRAPE POUCH INSTRU U-SHP 10X18 (DRAPES) ×3 IMPLANT
DRAPE SURG 17X23 STRL (DRAPES) ×12 IMPLANT
ELECT BLADE 4.0 EZ CLEAN MEGAD (MISCELLANEOUS) ×3
ELECT REM PT RETURN 9FT ADLT (ELECTROSURGICAL) ×3
ELECTRODE BLDE 4.0 EZ CLN MEGD (MISCELLANEOUS) ×1 IMPLANT
ELECTRODE REM PT RTRN 9FT ADLT (ELECTROSURGICAL) ×1 IMPLANT
EVACUATOR 1/8 PVC DRAIN (DRAIN) ×2 IMPLANT
GAUZE SPONGE 4X4 12PLY STRL (GAUZE/BANDAGES/DRESSINGS) ×3 IMPLANT
GAUZE SPONGE 4X4 16PLY XRAY LF (GAUZE/BANDAGES/DRESSINGS) ×2 IMPLANT
GLOVE BIO SURGEON STRL SZ8.5 (GLOVE) ×3 IMPLANT
GLOVE BIOGEL M 8.0 STRL (GLOVE) ×2 IMPLANT
GLOVE BIOGEL PI IND STRL 7.5 (GLOVE) IMPLANT
GLOVE BIOGEL PI IND STRL 8.5 (GLOVE) IMPLANT
GLOVE BIOGEL PI INDICATOR 7.5 (GLOVE) ×4
GLOVE BIOGEL PI INDICATOR 8.5 (GLOVE) ×2
GLOVE ECLIPSE 7.5 STRL STRAW (GLOVE) ×2 IMPLANT
GLOVE EXAM NITRILE LRG STRL (GLOVE) IMPLANT
GLOVE EXAM NITRILE MD LF STRL (GLOVE) IMPLANT
GLOVE EXAM NITRILE XL STR (GLOVE) IMPLANT
GLOVE EXAM NITRILE XS STR PU (GLOVE) IMPLANT
GLOVE SS BIOGEL STRL SZ 8 (GLOVE) ×1 IMPLANT
GLOVE SUPERSENSE BIOGEL SZ 8 (GLOVE) ×2
GLOVE SURG SS PI 7.0 STRL IVOR (GLOVE) ×2 IMPLANT
GOWN STRL REUS W/ TWL LRG LVL3 (GOWN DISPOSABLE) IMPLANT
GOWN STRL REUS W/ TWL XL LVL3 (GOWN DISPOSABLE) ×1 IMPLANT
GOWN STRL REUS W/TWL 2XL LVL3 (GOWN DISPOSABLE) IMPLANT
GOWN STRL REUS W/TWL LRG LVL3 (GOWN DISPOSABLE) ×3
GOWN STRL REUS W/TWL XL LVL3 (GOWN DISPOSABLE) ×9
KIT BASIN OR (CUSTOM PROCEDURE TRAY) ×3 IMPLANT
KIT ROOM TURNOVER OR (KITS) ×3 IMPLANT
NDL HYPO 21X1.5 SAFETY (NEEDLE) IMPLANT
NEEDLE HYPO 21X1.5 SAFETY (NEEDLE) ×3 IMPLANT
NEEDLE HYPO 22GX1.5 SAFETY (NEEDLE) ×3 IMPLANT
NS IRRIG 1000ML POUR BTL (IV SOLUTION) ×3 IMPLANT
PACK LAMINECTOMY NEURO (CUSTOM PROCEDURE TRAY) ×3 IMPLANT
PAD ARMBOARD 7.5X6 YLW CONV (MISCELLANEOUS) ×13 IMPLANT
PATTIES SURGICAL .5 X1 (DISPOSABLE) IMPLANT
RUBBERBAND STERILE (MISCELLANEOUS) ×6 IMPLANT
SPONGE SURGIFOAM ABS GEL SZ50 (HEMOSTASIS) ×3 IMPLANT
STRIP CLOSURE SKIN 1/2X4 (GAUZE/BANDAGES/DRESSINGS) ×2 IMPLANT
SUT VIC AB 1 CT1 18XBRD ANBCTR (SUTURE) ×2 IMPLANT
SUT VIC AB 1 CT1 8-18 (SUTURE) ×9
SUT VIC AB 2-0 CP2 18 (SUTURE) ×6 IMPLANT
SYR 20CC LL (SYRINGE) ×2 IMPLANT
SYR 20ML ECCENTRIC (SYRINGE) ×3 IMPLANT
TAPE CLOTH SURG 4X10 WHT LF (GAUZE/BANDAGES/DRESSINGS) ×2 IMPLANT
TOWEL OR 17X24 6PK STRL BLUE (TOWEL DISPOSABLE) ×3 IMPLANT
TOWEL OR 17X26 10 PK STRL BLUE (TOWEL DISPOSABLE) ×3 IMPLANT
WATER STERILE IRR 1000ML POUR (IV SOLUTION) ×3 IMPLANT

## 2014-04-20 NOTE — Anesthesia Procedure Notes (Signed)
Procedure Name: Intubation Date/Time: 04/20/2014 11:42 AM Performed by: Maeola Harman Pre-anesthesia Checklist: Patient identified, Emergency Drugs available, Suction available, Patient being monitored and Timeout performed Patient Re-evaluated:Patient Re-evaluated prior to inductionOxygen Delivery Method: Circle system utilized Preoxygenation: Pre-oxygenation with 100% oxygen Intubation Type: IV induction Ventilation: Mask ventilation without difficulty Laryngoscope Size: Mac and 3 Grade View: Grade I Tube type: Oral Tube size: 7.5 mm Number of attempts: 1 Airway Equipment and Method: Stylet Placement Confirmation: ETT inserted through vocal cords under direct vision,  positive ETCO2 and breath sounds checked- equal and bilateral Secured at: 23 cm Tube secured with: Tape Dental Injury: Teeth and Oropharynx as per pre-operative assessment

## 2014-04-20 NOTE — Transfer of Care (Signed)
Immediate Anesthesia Transfer of Care Note  Patient: [name]  Procedure(s) Performed: Procedure(s): LUMBAR TWO-THREE, LUMBAR THREE-FOUR, LUMAR FOUR-FIVE LAMINECTOMIES (N/A)  Patient Location: PACU  Anesthesia Type:General  Level of Consciousness: awake, alert  and sedated  Airway & Oxygen Therapy: Patient connected to face mask oxygen  Post-op Assessment: Report given to PACU RN  Post vital signs: stable  Complications: No apparent anesthesia complications

## 2014-04-20 NOTE — H&P (Signed)
Subjective: The patient is a 80 year old white male retired [employer] who has complained of back, buttock, and leg pain consistent with neurogenic claudication. He has failed medical management and was worked up with a lumbar MRI. This demonstrated the patient had spinal stenosis at multiple levels. I discussed the various treatment options with the patient including surgery. He has weighed the risks, benefits, and alternative surgery and decided proceed with a lumbar laminectomy.   Past Medical History  Diagnosis Date  . CARCINOMA, SKIN, SQUAMOUS CELL 09/11/2009  . HYPERLIPIDEMIA 03/13/2009  . INSOMNIA, CHRONIC 09/11/2009  . HYPERTENSION 03/13/2009  . Atrial fibrillation 04/07/2009  . PULMONARY NODULE 09/11/2009  . GERD 03/13/2009  . Rosacea 03/13/2009  . Actinic keratosis 03/13/2009  . SKIN CANCER, HX OF 03/13/2009  . COLONIC POLYPS, HX OF 03/13/2009  . BENIGN PROSTATIC HYPERTROPHY, HX OF 04/07/2009  . POLYPECTOMY, HX OF 04/07/2009  . Dysrhythmia     hx afib  . Arthritis     Past Surgical History  Procedure Laterality Date  . Lumbar disc surgery      RUPTURE  . Polypectomy    . Ablation  2010    x2  . Eye surgery Bilateral     cataracts  . Colonoscopy w/ biopsies and polypectomy    . Skin cancer excision      Allergies  Allergen Reactions  . Amoxicillin-Pot Clavulanate Diarrhea and Nausea Only  . Losartan Other (See Comments)    Elevated creatinine levels    History  Substance Use Topics  . Smoking status: Former Smoker -- 1.00 packs/day for 30 years    Types: Cigarettes    Quit date: 03/13/1988  . Smokeless tobacco: Current User    Types: Chew  . Alcohol Use: No    Family History  Problem Relation Age of Onset  . Cancer Mother   . Cancer Father 57    COLON  . Heart disease Sister   . Heart disease Brother    Prior to Admission medications   Medication Sig Start Date End Date Taking? Authorizing Provider  aspirin EC 325 MG tablet Take 325 mg by mouth every morning.    Yes Historical Provider, MD  Cholecalciferol (VITAMIN D) 2000 UNITS tablet Take 2,000 Units by mouth daily.   Yes Historical Provider, MD  clobetasol ointment (TEMOVATE) 3.79 % Apply 1 application topically daily as needed (psoriasis).  11/08/13  Yes Historical Provider, MD  gabapentin (NEURONTIN) 100 MG capsule Take 200 mg by mouth at bedtime.   Yes Historical Provider, MD  lovastatin (MEVACOR) 20 MG tablet Take 1 tablet (20 mg total) by mouth at bedtime. 09/30/13 09/30/14 Yes [name], MD  metroNIDAZOLE (METROCREAM) 0.75 % cream Apply 1 application topically 3 (three) times a week. On the days he shaves 08/15/11  Yes [name], MD  nadolol (CORGARD) 20 MG tablet Take 1 tablet by mouth  daily 11/15/13  Yes [name], MD  omeprazole (PRILOSEC) 20 MG capsule Take 20 mg by mouth daily.     Yes Historical Provider, MD  tamsulosin (FLOMAX) 0.4 MG CAPS capsule Take 0.4 mg by mouth daily.    Yes Historical Provider, MD  vitamin B-12 (CYANOCOBALAMIN) 500 MCG tablet Take 500 mcg by mouth daily.   Yes Historical Provider, MD     Review of Systems  Positive ROS: As above  All other systems have been reviewed and were otherwise negative with the exception of those mentioned in the HPI and as above.  Objective: Vital  signs in last 24 hours:    General Appearance: Alert, cooperative, no distress, Head: Normocephalic, without obvious abnormality, atraumatic Eyes: PERRL, conjunctiva/corneas clear, EOM's intact,    Ears: Normal  Throat: Normal  Neck: Supple, symmetrical, trachea midline, no adenopathy; thyroid: No enlargement/tenderness/nodules; no carotid bruit or JVD Back: Symmetric, no curvature, ROM normal, no CVA tenderness Lungs: Clear to auscultation bilaterally, respirations unlabored Heart: Regular rate and rhythm, no murmur, rub or gallop Abdomen: Soft, non-tender,, no masses, no organomegaly Extremities: Extremities normal, atraumatic, no cyanosis or edema Pulses: 2+  and symmetric all extremities Skin: Skin color, texture, turgor normal, no rashes or lesions  NEUROLOGIC:   Mental status: alert and oriented, no aphasia, good attention span, Fund of knowledge/ memory ok Motor Exam - grossly normal Sensory Exam - grossly normal Reflexes:  Coordination - grossly normal Gait - grossly normal Balance - grossly normal Cranial Nerves: I: smell Not tested  II: visual acuity  OS: Normal  OD: Normal   II: visual fields Full to confrontation  II: pupils Equal, round, reactive to light  III,VII: ptosis None  III,IV,VI: extraocular muscles  Full ROM  V: mastication Normal  V: facial light touch sensation  Normal  V,VII: corneal reflex  Present  VII: facial muscle function - upper  Normal  VII: facial muscle function - lower Normal  VIII: hearing Not tested  IX: soft palate elevation  Normal  IX,X: gag reflex Present  XI: trapezius strength  5/5  XI: sternocleidomastoid strength 5/5  XI: neck flexion strength  5/5  XII: tongue strength  Normal    Data Review Lab Results  Component Value Date   WBC 4.9 04/14/2014   HGB 13.8 04/14/2014   HCT 40.6 04/14/2014   MCV 99.5 04/14/2014   PLT 152 04/14/2014   Lab Results  Component Value Date   NA 142 04/14/2014   K 3.8 04/14/2014   CL 105 04/14/2014   CO2 22 04/14/2014   BUN 16 04/14/2014   CREATININE 1.34 04/14/2014   GLUCOSE 172* 04/14/2014   Lab Results  Component Value Date   INR 1.1 03/27/2007    Assessment/Plan: L2-3, L3-4 and L4-5 spinal stenosis, lumbago, lumbar radiculopathy, neurogenic claudication: I have discussed situation with the patient. I have reviewed his imaging studies with them and pointed out the abnormalities. We have discussed the various treatment options including surgery. I described the surgical treatment option of a lumbar laminectomy. I have shown him surgical models. We have discussed the risks, benefits, alternatives, and likelihood of achieving our goals with surgery. I've  answered all his questions. He has decided proceed with surgery.   Mykeria Garman D 04/20/2014 7:17 AM

## 2014-04-20 NOTE — Progress Notes (Signed)
Subjective:  The patient is somnolent but easily arousable. He is in no apparent distress. He looks well.  Objective: Vital signs in last 24 hours: Temp:  [97 F (36.1 C)] 97 F (36.1 C) (08/27 0830) Pulse Rate:  [57] 57 (08/27 0830) Resp:  [20] 20 (08/27 0830) BP: (166)/(59) 166/59 mmHg (08/27 0831) SpO2:  [99 %] 99 % (08/27 0830) Weight:  [85.73 kg (189 lb)] 85.73 kg (189 lb) (08/27 0830)  Intake/Output from previous day:   Intake/Output this shift: Total I/O In: 1000 [I.V.:1000] Out: 150 [Blood:150]  Physical exam the patient is somnolent but arousable. He is moving his lower extremities well.  Lab Results: No results found for this basename: WBC, HGB, HCT, PLT,  in the last 72 hours BMET No results found for this basename: NA, K, CL, CO2, GLUCOSE, BUN, CREATININE, CALCIUM,  in the last 72 hours  Studies/Results: Dg Lumbar Spine 1 View  04/20/2014   CLINICAL DATA:  Portable lumbar spine imaging for surgical localization.  EXAM: LUMBAR SPINE - 1 VIEW  COMPARISON:  Lumbar spine radiographs, 01/20/2014.  FINDINGS: Surgical probe has been inserted posteriorly between skin retractors. The tip lies posterior to the posterior margin of the L5-S1 disc. There is moderate loss disc height at L5-S1, stable from the prior study.  IMPRESSION: Surgical localization image as described.   Electronically Signed   By: Lajean Manes M.D.   On: 04/20/2014 13:58    Assessment/Plan: The patient is doing well.  LOS: 0 days     Asia Dusenbury D 04/20/2014, 2:35 PM

## 2014-04-20 NOTE — Op Note (Signed)
Brief history: The patient is an 80 year old white male who has complained of back, buttock, and leg pain consistent with neurogenic claudication. He has failed medical management and was worked up with a lumbar MRI. This demonstrated multilevel degenerative changes with spinal stenosis at L2-3, L3-4 and L4-5. I discussed the various treatment option with the patient including surgery. He has weighed the risks, benefits, and alternatives surgery and decided proceed with an L2-3, L3-4 and L4-5 laminectomy.  Preoperative diagnosis: L2-3, L3-4 and L4-5 spinal stenosis, lumbago, lumbar radiculopathy, neurogenic claudication  Postoperative diagnosis: The same  Procedure: L3 and L4 laminectomy with bilateral L2 laminotomies to decompress the bilateral L3, L4 and L5 nerve roots  using micro-dissection  Surgeon: Dr. Earle Gell  Asst.: Dr. Erline Levine  Anesthesia: Gen. endotracheal  Estimated blood loss: 150 cc  Drains: One medium Hemovac drain in the epidural space.  Complications: None  Description of procedure: The patient was brought to the operating room by the anesthesia team. General endotracheal anesthesia was induced. The patient was turned to the prone position on the Wilson frame. The patient's lumbosacral region was then prepared with Betadine scrub and Betadine solution. Sterile drapes were applied.  I then injected the area to be incised with Marcaine with epinephrine solution. I then used a scalpel to make a linear midline incision over the L2-3, L3-4 and L4-5 intervertebral disc space. I then used electrocautery to perform a bilateral  subperiosteal dissection exposing the spinous process and lamina of L2-L5. We obtained intraoperative radiograph to confirm our location. I then inserted the [facility] retractor for exposure. I incised interspinous ligament at L2-3, L3-4 and L4-5 with the scalpel. I used Leksell nodule were to remove the spinous process of L4 and L3 and the caudal  aspect of the L2 spinous process.  We then brought the operative microscope into the field. Under its magnification and illumination we completed the microdissection. I used a high-speed drill to perform a laminotomy at L2, L3 and L4 bilaterally. I then used a Kerrison punches to complete the laminectomy at L3 and L4 and to widen the laminotomies at L2 and removed the ligamentum flavum at L2-3 L3-4 and L4-5. We then used microdissection to free up the thecal sac and the bilateral L3, L4 and L5 nerve root from the epidural tissue. I then used a Kerrison punch to perform a foraminotomy at about the bilateral L3, L4 and L5 nerve root. We inspected the intervertebral disc at L2-3, L3-4 and L4-5. There were no significant herniations.  I then palpated along the ventral surface of the thecal sac and along exit route of the bilateral L3, L4 and L5 nerve root and noted that the neural structures were well decompressed. This completed the decompression.  We then obtained hemostasis using bipolar electrocautery. We irrigated the wound out with bacitracin solution. We then removed the retractor. I placed a medium Hemovac drain in the epidural space and tunneled it out through a separate stab wound. We then reapproximated the patient's thoracolumbar fascia with interrupted #1 Vicryl suture. We then reapproximated the patient's subcutaneous tissue with interrupted 2-0 Vicryl suture. We then reapproximated patient's skin with Steri-Strips and benzoin. The was then coated with bacitracin ointment. The drapes were removed. The patient was subsequently returned to the supine position where they were extubated by the anesthesia team. The patient was then transported to the postanesthesia care unit in stable condition. All sponge instrument and needle counts were reportedly correct at the end of this case.

## 2014-04-20 NOTE — Anesthesia Postprocedure Evaluation (Signed)
  Anesthesia Post-op Note  Patient: [name]  Procedure(s) Performed: Procedure(s): LUMBAR TWO-THREE, LUMBAR THREE-FOUR, LUMAR FOUR-FIVE LAMINECTOMIES (N/A)  Patient Location: PACU  Anesthesia Type:General  Level of Consciousness: awake  Airway and Oxygen Therapy: Patient Spontanous Breathing  Post-op Pain: mild  Post-op Assessment: Post-op Vital signs reviewed  Post-op Vital Signs: Reviewed  Last Vitals:  Filed Vitals:   04/20/14 1451  BP: 151/67  Pulse: 56  Temp:   Resp: 19    Complications: No apparent anesthesia complications

## 2014-04-20 NOTE — Anesthesia Preprocedure Evaluation (Addendum)
Anesthesia Evaluation  Patient identified by MRN, date of birth, ID band Patient awake    Reviewed: Allergy & Precautions, H&P , NPO status , Patient's Chart, lab work & pertinent test results, reviewed documented beta blocker date and time   Airway Mallampati: II      Dental   Pulmonary former smoker,  breath sounds clear to auscultation        Cardiovascular hypertension, Pt. on medications and Pt. on home beta blockers + dysrhythmias Atrial Fibrillation Rhythm:Regular Rate:Normal     Neuro/Psych    GI/Hepatic Neg liver ROS, GERD-  ,  Endo/Other  negative endocrine ROS  Renal/GU Renal InsufficiencyRenal diseasenegative Renal ROS     Musculoskeletal   Abdominal   Peds  Hematology   Anesthesia Other Findings   Reproductive/Obstetrics                         Anesthesia Physical Anesthesia Plan  ASA: III  Anesthesia Plan: General   Post-op Pain Management:    Induction: Intravenous  Airway Management Planned: Oral ETT  Additional Equipment:   Intra-op Plan:   Post-operative Plan: Extubation in OR  Informed Consent: I have reviewed the patients History and Physical, chart, labs and discussed the procedure including the risks, benefits and alternatives for the proposed anesthesia with the patient or authorized representative who has indicated his/her understanding and acceptance.   Dental advisory given  Plan Discussed with: CRNA and Anesthesiologist  Anesthesia Plan Comments:         Anesthesia Quick Evaluation

## 2014-04-21 ENCOUNTER — Encounter (HOSPITAL_COMMUNITY): Payer: Self-pay | Admitting: Neurosurgery

## 2014-04-21 NOTE — Evaluation (Signed)
Physical Therapy Evaluation Patient Details Name: [name] MRN: [id number] DOB: 08-23-1932 Today's Date: 04/21/2014   History of Present Illness  S/P L2-4 laminectomies/foraminectomies to decompress L 3-5 nerve roots.  Clinical Impression  All education has been completed with pt and wife.  Pt is at supervision level and expect quick return to baseline function.  No further PT needs.  Will sign off.    Follow Up Recommendations No PT follow up    Equipment Recommendations  Rolling walker with 5" wheels    Recommendations for Other Services       Precautions / Restrictions Precautions Precautions: Back Restrictions Weight Bearing Restrictions: No      Mobility  Bed Mobility Overal bed mobility: Needs Assistance Bed Mobility: Rolling;Sidelying to Sit;Sit to Sidelying Rolling: Supervision Sidelying to sit: Supervision     Sit to sidelying: Supervision General bed mobility comments: demo'd and reinforced safe logroll/bed mobility technique  Transfers Overall transfer level: Needs assistance   Transfers: Sit to/from Stand Sit to Stand: Supervision         General transfer comment: reinforced safe transfer technique/hand placement  Ambulation/Gait Ambulation/Gait assistance: Supervision Ambulation Distance (Feet): 200 Feet Assistive device: Rolling walker (2 wheeled) Gait Pattern/deviations: Step-through pattern Gait velocity: prefers slower, but can speed up to age appropriate levels   General Gait Details: steady and slower with RW.  More unsteady during transfers without RW  Stairs Stairs: Yes Stairs assistance: Supervision Stair Management: One rail Right;Alternating pattern;Forwards Number of Stairs: 5 General stair comments: steady, but guarded with rail  Wheelchair Mobility    Modified Rankin (Stroke Patients Only)       Balance Overall balance assessment: Needs assistance Sitting-balance support: No upper extremity supported Sitting  balance-Leahy Scale: Good     Standing balance support: No upper extremity supported Standing balance-Leahy Scale: Fair                               Pertinent Vitals/Pain Pain Assessment: 0-10 Pain Score:  (reports only incision discomfort not rated)    Home Living Family/patient expects to be discharged to:: Private residence Living Arrangements: Spouse/significant other Available Help at Discharge: Family Type of Home: House Home Access: Level entry     Home Layout: Two level;Able to live on main level with bedroom/bathroom Home Equipment: None      Prior Function Level of Independence: Independent         Comments: Golfed 2x/wk     Hand Dominance        Extremity/Trunk Assessment   Upper Extremity Assessment: Defer to OT evaluation           Lower Extremity Assessment: Overall WFL for tasks assessed      Cervical / Trunk Assessment: Normal  Communication   Communication: No difficulties  Cognition Arousal/Alertness: Awake/alert Behavior During Therapy: WFL for tasks assessed/performed Overall Cognitive Status: Within Functional Limits for tasks assessed                      General Comments General comments (skin integrity, edema, etc.): educated pt/wife in back care/prec. logroll, lifting restrictions, progression of activity.    Exercises        Assessment/Plan    PT Assessment Patient needs continued PT services  PT Diagnosis     PT Problem List Decreased strength;Decreased activity tolerance;Decreased mobility;Decreased knowledge of use of DME;Decreased knowledge of precautions  PT Treatment Interventions  PT Goals (Current goals can be found in the Care Plan section) Acute Rehab PT Goals PT Goal Formulation: No goals set, d/c therapy    Frequency     Barriers to discharge        Co-evaluation               End of Session   Activity Tolerance: Patient tolerated treatment well Patient left: in  chair;with call bell/phone within reach;with family/visitor present Nurse Communication: Mobility status         Time: 2637-8588 PT Time Calculation (min): 32 min   Charges:   PT Evaluation $Initial PT Evaluation Tier I: 1 Procedure PT Treatments $Gait Training: 8-22 mins $Self Care/Home Management: 8-22   PT G Codes:          Imagene Boss, Tessie Fass 04/21/2014, 11:42 AM

## 2014-04-21 NOTE — Progress Notes (Signed)
Patient ID: [name], male   DOB: 1932/05/12, 80 y.o.   MRN: [id number] Subjective:  The patient is alert and pleasant. He looks well.  Objective: Vital signs in last 24 hours: Temp:  [97 F (36.1 C)-98.1 F (36.7 C)] 97.6 F (36.4 C) (08/28 0616) Pulse Rate:  [45-62] 47 (08/28 0616) Resp:  [9-22] 20 (08/28 0616) BP: (101-174)/(48-97) 126/97 mmHg (08/28 0616) SpO2:  [95 %-100 %] 95 % (08/28 0616) Weight:  [85.73 kg (189 lb)-86.2 kg (190 lb 0.6 oz)] 86.2 kg (190 lb 0.6 oz) (08/27 1653)  Intake/Output from previous day: 08/27 0701 - 08/28 0700 In: 1990 [P.O.:490; I.V.:1500] Out: 450 [Urine:100; Drains:200; Blood:150] Intake/Output this shift:    Physical exam the patient is alert and oriented x3. His strength is grossly normal his lower extremities.  Lab Results: No results found for this basename: WBC, HGB, HCT, PLT,  in the last 72 hours BMET No results found for this basename: NA, K, CL, CO2, GLUCOSE, BUN, CREATININE, CALCIUM,  in the last 72 hours  Studies/Results: Dg Lumbar Spine 1 View  04/20/2014   CLINICAL DATA:  Portable lumbar spine imaging for surgical localization.  EXAM: LUMBAR SPINE - 1 VIEW  COMPARISON:  Lumbar spine radiographs, 01/20/2014.  FINDINGS: Surgical probe has been inserted posteriorly between skin retractors. The tip lies posterior to the posterior margin of the L5-S1 disc. There is moderate loss disc height at L5-S1, stable from the prior study.  IMPRESSION: Surgical localization image as described.   Electronically Signed   By: Lajean Manes M.D.   On: 04/20/2014 13:58    Assessment/Plan: Postop day 1: The patient is doing well. We will discontinue his drain. He will likely go home tomorrow. I gave him his discharge instructions and answered all his questions.  LOS: 1 day     Luvia Orzechowski D 04/21/2014, 8:04 AM

## 2014-04-21 NOTE — Progress Notes (Signed)
Utilization review completed. Naithen Rivenburg, RN, BSN. 

## 2014-04-22 MED ORDER — HYDROCODONE-ACETAMINOPHEN 5-325 MG PO TABS: 1.0000 | ORAL_TABLET | ORAL | Status: DC | PRN

## 2014-04-22 NOTE — Progress Notes (Signed)
Patient is discharged from room 4N08 at this time. Alert and in stable condition. IV site d/c'd. Instructions read to patient and understanding verbalized. Left unit via wheelchair with wife and belongings at side.

## 2014-04-22 NOTE — Discharge Summary (Signed)
Physician Discharge Summary  Patient ID: [name] MRN: [id number] DOB/AGE: 1932-07-21 80 y.o.  Admit date: 04/20/2014 Discharge date: 04/22/2014  Admission Diagnoses: Lumbar stenosis   Discharge Diagnoses: Same   Discharged Condition: good  Hospital Course: The patient was admitted on 04/20/2014 and taken to the operating room where the patient underwent decompressive laminectomy. The patient tolerated the procedure well and was taken to the recovery room and then to the floor in stable condition. The hospital course was routine. There were no complications. The wound remained clean dry and intact. Pt had appropriate back soreness. No complaints of leg pain or new N/T/W. The patient remained afebrile with stable vital signs, and tolerated a regular diet. The patient continued to increase activities, and pain was well controlled with oral pain medications.   Consults: None  Significant Diagnostic Studies:  Results for orders placed during the hospital encounter of 44/92/01  BASIC METABOLIC PANEL      Result Value Ref Range   Sodium 142  137 - 147 mEq/L   Potassium 3.8  3.7 - 5.3 mEq/L   Chloride 105  96 - 112 mEq/L   CO2 22  19 - 32 mEq/L   Glucose, Bld 172 (*) 70 - 99 mg/dL   BUN 16  6 - 23 mg/dL   Creatinine, Ser 1.34  0.50 - 1.35 mg/dL   Calcium 9.0  8.4 - 10.5 mg/dL   GFR calc non Af Amer 48 (*) >90 mL/min   GFR calc Af Amer 56 (*) >90 mL/min   Anion gap 15  5 - 15  CBC      Result Value Ref Range   WBC 4.9  4.0 - 10.5 K/uL   RBC 4.08 (*) 4.22 - 5.81 MIL/uL   Hemoglobin 13.8  13.0 - 17.0 g/dL   HCT 40.6  39.0 - 52.0 %   MCV 99.5  78.0 - 100.0 fL   MCH 33.8  26.0 - 34.0 pg   MCHC 34.0  30.0 - 36.0 g/dL   RDW 12.6  11.5 - 15.5 %   Platelets 152  150 - 400 K/uL    Dg Lumbar Spine 1 View  04/20/2014   CLINICAL DATA:  Portable lumbar spine imaging for surgical localization.  EXAM: LUMBAR SPINE - 1 VIEW  COMPARISON:  Lumbar spine radiographs, 01/20/2014.  FINDINGS:  Surgical probe has been inserted posteriorly between skin retractors. The tip lies posterior to the posterior margin of the L5-S1 disc. There is moderate loss disc height at L5-S1, stable from the prior study.  IMPRESSION: Surgical localization image as described.   Electronically Signed   By: Lajean Manes M.D.   On: 04/20/2014 13:58    Antibiotics:  Anti-infectives   Start     Dose/Rate Route Frequency Ordered Stop   04/20/14 1700  ceFAZolin (ANCEF) IVPB 2 g/50 mL premix     2 g 100 mL/hr over 30 Minutes Intravenous Every 8 hours 04/20/14 1644 04/21/14 0042   04/20/14 1255  bacitracin 50,000 Units in sodium chloride irrigation 0.9 % 500 mL irrigation  Status:  Discontinued       As needed 04/20/14 1255 04/20/14 1431   04/20/14 0835  vancomycin (VANCOCIN) IVPB 1000 mg/200 mL premix  Status:  Discontinued     1,000 mg 200 mL/hr over 60 Minutes Intravenous On call to O.R. 04/20/14 0071 04/20/14 1636   04/20/14 0600  vancomycin (VANCOCIN) IVPB 1000 mg/200 mL premix     1,000 mg 200 mL/hr over 60  Minutes Intravenous On call to O.R. 04/19/14 1414 04/20/14 1242      Discharge Exam: Blood pressure 136/55, pulse 65, temperature 99.3 F (37.4 C), temperature source Oral, resp. rate 18, height 6\' 2"  (1.88 m), weight 86.2 kg (190 lb 0.6 oz), SpO2 96.00%. Neurologic: Grossly normal Incision okay  Discharge Medications:     Medication List         aspirin EC 325 MG tablet  Take 325 mg by mouth every morning.     clobetasol ointment 0.05 %  Commonly known as:  TEMOVATE  Apply 1 application topically daily as needed (psoriasis).     gabapentin 100 MG capsule  Commonly known as:  NEURONTIN  Take 200 mg by mouth at bedtime.     HYDROcodone-acetaminophen 5-325 MG per tablet  Commonly known as:  NORCO/VICODIN  Take 1-2 tablets by mouth every 4 (four) hours as needed for moderate pain.     lovastatin 20 MG tablet  Commonly known as:  MEVACOR  Take 1 tablet (20 mg total) by mouth at  bedtime.     metroNIDAZOLE 0.75 % cream  Commonly known as:  METROCREAM  Apply 1 application topically 3 (three) times a week. On the days he shaves     nadolol 20 MG tablet  Commonly known as:  CORGARD  Take 1 tablet by mouth  daily     omeprazole 20 MG capsule  Commonly known as:  PRILOSEC  Take 20 mg by mouth daily.     tamsulosin 0.4 MG Caps capsule  Commonly known as:  FLOMAX  Take 0.4 mg by mouth daily.     vitamin B-12 500 MCG tablet  Commonly known as:  CYANOCOBALAMIN  Take 500 mcg by mouth daily.     Vitamin D 2000 UNITS tablet  Take 2,000 Units by mouth daily.        Disposition: Home   Final Dx: Decompressive laminectomy for stenosis      Discharge Instructions   Call MD for:  difficulty breathing, headache or visual disturbances    Complete by:  As directed      Call MD for:  persistant nausea and vomiting    Complete by:  As directed      Call MD for:  redness, tenderness, or signs of infection (pain, swelling, redness, odor or green/yellow discharge around incision site)    Complete by:  As directed      Call MD for:  severe uncontrolled pain    Complete by:  As directed      Call MD for:  temperature >100.4    Complete by:  As directed      Diet - low sodium heart healthy    Complete by:  As directed      Discharge instructions    Complete by:  As directed   No heavy lifting, no strenuous activity, no bending or twisting, may shower     Increase activity slowly    Complete by:  As directed               Signed: N[place] Gustafson S 04/22/2014, 8:43 AM

## 2014-04-24 ENCOUNTER — Telehealth: Payer: Self-pay | Admitting: Family Medicine

## 2014-04-24 NOTE — Telephone Encounter (Signed)
Patient Information:  Caller Name: [name]  Phone: [phone]  Patient: [name], [name]  Gender: Male  DOB: Jul 06, 1932  Age: 80 Years  PCP: Carolann Littler [facility])  Office Follow Up:  Does the office need to follow up with this patient?: No  Instructions For The Office: N/A  RN Note:  Per nursing judgement, pt needs tobe seen for medication adjustment and evaluation of pedal swelling.  Offered appt for today but pt requesting Thursday or Friday for an appt; instructed that they will need to call back Wednesday for an appt on Thursday.  Will comply; call back instructions reviewed  Symptoms  Reason For Call & Symptoms: Wife is calling to see if pt should start back on  Losartin since he saw nephrologist due to high creatinine levels; nephrologist stated that kidneys are fine it is the BP medication that is causing the creatinine levels to be elevated;  since being off BP medication his feet and ankles are swelling mildly per wife but nephrologist states that his BP is runningtoo low; BP 124/58 today at 11:00am  Reviewed Health History In EMR: Yes  Reviewed Medications In EMR: Yes  Reviewed Allergies In EMR: Yes  Reviewed Surgeries / Procedures: Yes  Date of Onset of Symptoms: Unknown  Guideline(s) Used:  High Blood Pressure  Disposition Per Guideline:   Home Care  Reason For Disposition Reached:   BP < 140 /90 and taking BP medications  Advice Given:  Call Back If:  You become worse.  RN Overrode Recommendation:  Make Appointment  Pt will call back Wednesday for an appt on Thursday

## 2014-04-24 NOTE — Telephone Encounter (Signed)
Noted  

## 2014-04-25 ENCOUNTER — Other Ambulatory Visit: Payer: Self-pay | Admitting: Family Medicine

## 2014-04-25 ENCOUNTER — Ambulatory Visit (INDEPENDENT_AMBULATORY_CARE_PROVIDER_SITE_OTHER): Payer: Medicare Other | Admitting: Family Medicine

## 2014-04-25 ENCOUNTER — Encounter: Payer: Self-pay | Admitting: Family Medicine

## 2014-04-25 VITALS — BP 140/70 | HR 60 | Temp 97.9°F | Wt 190.0 lb

## 2014-04-25 DIAGNOSIS — R609 Edema, unspecified: Secondary | ICD-10-CM

## 2014-04-25 DIAGNOSIS — I10 Essential (primary) hypertension: Secondary | ICD-10-CM

## 2014-04-25 DIAGNOSIS — R600 Localized edema: Secondary | ICD-10-CM

## 2014-04-25 MED ORDER — FUROSEMIDE 20 MG PO TABS: 20.0000 mg | ORAL_TABLET | Freq: Every day | ORAL | Status: DC

## 2014-04-25 NOTE — Patient Instructions (Signed)
Take furosemide 20 mg once daily in AM for 3 days then hold. Touch base by next week if edema not improved.

## 2014-04-25 NOTE — Progress Notes (Signed)
Pre visit review using our clinic review tool, if applicable. No additional management support is needed unless otherwise documented below in the visit note. 

## 2014-04-25 NOTE — Progress Notes (Signed)
Subjective:    Patient ID: [name], male    DOB: [birth year], 80 y.o.   MRN: [id number]  HPI Patient seen with bilateral foot leg ankle edema. He had recent surgery low back last week. He was admitted Thursday to Saturday. He had recent acute on chronic kidney failure and was taken off losartan HCTZ with improvement in renal function. Baseline creatinine around 1.3.  He had some mild increase in edema since he went off that. No orthopnea. No dyspnea. No history of heart failure. His usual weight at home is around 172 pounds and recently went to 190 pounds. His had some constipation off and on since his surgery and is trying to avoid further pain medications. He currently takes nadolol for hypertension. No other blood pressure medications. Blood pressure mostly around 629 or less systolic since taken off losartan.  Past Medical History  Diagnosis Date  . CARCINOMA, SKIN, SQUAMOUS CELL 09/11/2009  . HYPERLIPIDEMIA 03/13/2009  . INSOMNIA, CHRONIC 09/11/2009  . HYPERTENSION 03/13/2009  . Atrial fibrillation 04/07/2009  . PULMONARY NODULE 09/11/2009  . GERD 03/13/2009  . Rosacea 03/13/2009  . Actinic keratosis 03/13/2009  . SKIN CANCER, HX OF 03/13/2009  . COLONIC POLYPS, HX OF 03/13/2009  . BENIGN PROSTATIC HYPERTROPHY, HX OF 04/07/2009  . POLYPECTOMY, HX OF 04/07/2009  . Dysrhythmia     hx afib  . Arthritis    Past Surgical History  Procedure Laterality Date  . Lumbar disc surgery      RUPTURE  . Polypectomy    . Ablation  2010    x2  . Eye surgery Bilateral     cataracts  . Colonoscopy w/ biopsies and polypectomy    . Skin cancer excision    . Lumbar laminectomy/decompression microdiscectomy N/A 04/20/2014    Procedure: LUMBAR TWO-THREE, LUMBAR THREE-FOUR, LUMAR FOUR-FIVE LAMINECTOMIES;  Surgeon: Newman Pies, MD;  Location: [place] [facility];  Service: Neurosurgery;  Laterality: N/A;    reports that he quit smoking about 26 years ago. His smoking use included Cigarettes. He has a 30  pack-year smoking history. His smokeless tobacco use includes Chew. He reports that he does not drink alcohol or use illicit drugs. family history includes Cancer in his mother; Cancer (age of onset: 21) in his father; Heart disease in his brother and sister. Allergies  Allergen Reactions  . Amoxicillin-Pot Clavulanate Diarrhea and Nausea Only  . Losartan Other (See Comments)    Elevated creatinine levels      Review of Systems  Constitutional: Negative for fatigue.  Eyes: Negative for visual disturbance.  Respiratory: Negative for cough, chest tightness and shortness of breath.   Cardiovascular: Positive for leg swelling. Negative for chest pain and palpitations.  Neurological: Negative for dizziness, syncope, weakness, light-headedness and headaches.       Objective:   Physical Exam  Constitutional: He appears well-developed and well-nourished.  Neck: No JVD present.  Cardiovascular: Normal rate and regular rhythm.   Pulmonary/Chest: Effort normal and breath sounds normal. No respiratory distress. He has no wheezes. He has no rales.  Musculoskeletal: He exhibits edema.  Trace pitting edema feet ankles lower legs bilaterally  Neurological: He is alert.          Assessment & Plan:  #1 bilateral leg and foot edema. Suspect related to recent immobility and IV fluids. No history of heart failure. Would recommend a short-term only use of furosemide 20 mg once daily and adequate potassium supplement to diet. Monitor weights at home. Touch base in  3 days if edema not improved. #2 hypertension which is currently stable off losartan.  He will continue with Nadolol and be in touch if BP consistently > 140/90.

## 2014-04-26 ENCOUNTER — Other Ambulatory Visit: Payer: Self-pay

## 2014-04-26 MED ORDER — METRONIDAZOLE 0.75 % EX CREA: 1.0000 "application " | TOPICAL_CREAM | CUTANEOUS | Status: DC

## 2014-04-28 ENCOUNTER — Telehealth: Payer: Self-pay | Admitting: Family Medicine

## 2014-04-28 NOTE — Telephone Encounter (Signed)
Take another 2 days, then hold.

## 2014-04-28 NOTE — Telephone Encounter (Signed)
Pt instructed to take lasix 3 days only for feet swelling. Still some swelling, a little (alot)better Pt would like to know should he continue to take lasix.Marland Kitchen  bp running 149/74 p 56  Today bp 143/72  p 61 Pt would like to know something before noon.

## 2014-04-28 NOTE — Telephone Encounter (Signed)
Pt informed

## 2014-05-15 ENCOUNTER — Other Ambulatory Visit: Payer: Self-pay | Admitting: Family Medicine

## 2014-05-23 ENCOUNTER — Ambulatory Visit: Payer: Medicare Other | Admitting: Family Medicine

## 2014-05-24 ENCOUNTER — Encounter: Payer: Self-pay | Admitting: Family Medicine

## 2014-05-24 ENCOUNTER — Ambulatory Visit (INDEPENDENT_AMBULATORY_CARE_PROVIDER_SITE_OTHER): Payer: Medicare Other | Admitting: Family Medicine

## 2014-05-24 VITALS — BP 130/68 | HR 66 | Wt 183.0 lb

## 2014-05-24 DIAGNOSIS — I10 Essential (primary) hypertension: Secondary | ICD-10-CM

## 2014-05-24 DIAGNOSIS — Z23 Encounter for immunization: Secondary | ICD-10-CM | POA: Diagnosis not present

## 2014-05-24 NOTE — Progress Notes (Signed)
   Subjective:    Patient ID: Steven SHEHADEH, male    DOB: 11/27/1931, 80 y.o.   MRN: [id number]  Hypertension Pertinent negatives include no chest pain, headaches, palpitations or shortness of breath.   Patient seen for medical followup. Recent back surgery. Prior to surgery he had creatinine elevated from baseline around 1.3 to 2.0. He was seen by nephrology and losartan discontinued. Ultrasound had revealed no hydronephrosis and normal size kidneys. Has done well since his surgery.  Post surgery creatinine 1.3.    Blood pressures vary considerably from around 132 systolic to occasionally 440. No headaches. Back pain is improved. No chest pains. No peripheral edema issues. Patient has recently tapered off of gabapentin has not noted any increase in back pains. He remains on nadolol.  Past Medical History  Diagnosis Date  . CARCINOMA, SKIN, SQUAMOUS CELL 09/11/2009  . HYPERLIPIDEMIA 03/13/2009  . INSOMNIA, CHRONIC 09/11/2009  . HYPERTENSION 03/13/2009  . Atrial fibrillation 04/07/2009  . PULMONARY NODULE 09/11/2009  . GERD 03/13/2009  . Rosacea 03/13/2009  . Actinic keratosis 03/13/2009  . SKIN CANCER, HX OF 03/13/2009  . COLONIC POLYPS, HX OF 03/13/2009  . BENIGN PROSTATIC HYPERTROPHY, HX OF 04/07/2009  . POLYPECTOMY, HX OF 04/07/2009  . Dysrhythmia     hx afib  . Arthritis    Past Surgical History  Procedure Laterality Date  . Lumbar disc surgery      RUPTURE  . Polypectomy    . Ablation  2010    x2  . Eye surgery Bilateral     cataracts  . Colonoscopy w/ biopsies and polypectomy    . Skin cancer excision    . Lumbar laminectomy/decompression microdiscectomy N/A 04/20/2014    Procedure: LUMBAR TWO-THREE, LUMBAR THREE-FOUR, LUMAR FOUR-FIVE LAMINECTOMIES;  Surgeon: Newman Pies, MD;  Location: [place] [facility];  Service: Neurosurgery;  Laterality: N/A;    reports that he quit smoking about 26 years ago. His smoking use included Cigarettes. He has a 30 pack-year smoking history. His  smokeless tobacco use includes Chew. He reports that he does not drink alcohol or use illicit drugs. family history includes Cancer in his mother; Cancer (age of onset: 56) in his father; Heart disease in his brother and sister. Allergies  Allergen Reactions  . Amoxicillin-Pot Clavulanate Diarrhea and Nausea Only  . Losartan Other (See Comments)    Elevated creatinine levels      Review of Systems  Constitutional: Negative for fatigue.  Eyes: Negative for visual disturbance.  Respiratory: Negative for cough, chest tightness and shortness of breath.   Cardiovascular: Negative for chest pain, palpitations and leg swelling.  Neurological: Negative for dizziness, syncope, weakness, light-headedness and headaches.       Objective:   Physical Exam  Constitutional: He is oriented to person, place, and time. He appears well-developed and well-nourished.  HENT:  Right Ear: External ear normal.  Left Ear: External ear normal.  Mouth/Throat: Oropharynx is clear and moist.  Eyes: Pupils are equal, round, and reactive to light.  Neck: Neck supple. No thyromegaly present.  Cardiovascular: Normal rate and regular rhythm.   Pulmonary/Chest: Effort normal and breath sounds normal. No respiratory distress. He has no wheezes. He has no rales.  Musculoskeletal: He exhibits no edema.  Neurological: He is alert and oriented to person, place, and time.          Assessment & Plan:  Hypertension. Stable. Repeat blood pressure today left arm seated 118/60. We recommend observation. Continue  nadolol.

## 2014-05-24 NOTE — Progress Notes (Signed)
Pre visit review using our clinic review tool, if applicable. No additional management support is needed unless otherwise documented below in the visit note. 

## 2014-05-24 NOTE — Patient Instructions (Signed)
Check on insurance coverage for shingles vaccine. Continue to monitor blood pressure and be in touch if consistently greater than 150/90

## 2014-05-26 ENCOUNTER — Ambulatory Visit: Payer: Medicare Other | Admitting: Family Medicine

## 2014-06-06 DIAGNOSIS — H01003 Unspecified blepharitis right eye, unspecified eyelid: Secondary | ICD-10-CM | POA: Diagnosis not present

## 2014-06-06 DIAGNOSIS — Z961 Presence of intraocular lens: Secondary | ICD-10-CM | POA: Diagnosis not present

## 2014-06-09 ENCOUNTER — Other Ambulatory Visit: Payer: Self-pay

## 2014-07-13 DIAGNOSIS — Z85820 Personal history of malignant melanoma of skin: Secondary | ICD-10-CM | POA: Diagnosis not present

## 2014-07-13 DIAGNOSIS — L820 Inflamed seborrheic keratosis: Secondary | ICD-10-CM | POA: Diagnosis not present

## 2014-07-13 DIAGNOSIS — C44619 Basal cell carcinoma of skin of left upper limb, including shoulder: Secondary | ICD-10-CM | POA: Diagnosis not present

## 2014-07-13 DIAGNOSIS — L821 Other seborrheic keratosis: Secondary | ICD-10-CM | POA: Diagnosis not present

## 2014-07-13 DIAGNOSIS — L570 Actinic keratosis: Secondary | ICD-10-CM | POA: Diagnosis not present

## 2014-07-13 DIAGNOSIS — D485 Neoplasm of uncertain behavior of skin: Secondary | ICD-10-CM | POA: Diagnosis not present

## 2014-08-22 DIAGNOSIS — M545 Low back pain: Secondary | ICD-10-CM | POA: Diagnosis not present

## 2014-08-22 DIAGNOSIS — R030 Elevated blood-pressure reading, without diagnosis of hypertension: Secondary | ICD-10-CM | POA: Diagnosis not present

## 2014-08-29 DIAGNOSIS — C44619 Basal cell carcinoma of skin of left upper limb, including shoulder: Secondary | ICD-10-CM | POA: Diagnosis not present

## 2014-09-07 ENCOUNTER — Encounter (HOSPITAL_COMMUNITY): Payer: Self-pay | Admitting: Neurosurgery

## 2014-09-14 DIAGNOSIS — C44619 Basal cell carcinoma of skin of left upper limb, including shoulder: Secondary | ICD-10-CM | POA: Diagnosis not present

## 2014-10-13 DIAGNOSIS — Z85828 Personal history of other malignant neoplasm of skin: Secondary | ICD-10-CM | POA: Diagnosis not present

## 2014-10-13 DIAGNOSIS — L820 Inflamed seborrheic keratosis: Secondary | ICD-10-CM | POA: Diagnosis not present

## 2014-10-13 DIAGNOSIS — D1801 Hemangioma of skin and subcutaneous tissue: Secondary | ICD-10-CM | POA: Diagnosis not present

## 2014-10-13 DIAGNOSIS — L821 Other seborrheic keratosis: Secondary | ICD-10-CM | POA: Diagnosis not present

## 2014-10-13 DIAGNOSIS — L570 Actinic keratosis: Secondary | ICD-10-CM | POA: Diagnosis not present

## 2014-10-16 ENCOUNTER — Encounter: Payer: Self-pay | Admitting: Family Medicine

## 2014-10-17 ENCOUNTER — Other Ambulatory Visit: Payer: Self-pay | Admitting: Family Medicine

## 2014-11-14 DIAGNOSIS — H40013 Open angle with borderline findings, low risk, bilateral: Secondary | ICD-10-CM | POA: Diagnosis not present

## 2014-11-14 DIAGNOSIS — Z961 Presence of intraocular lens: Secondary | ICD-10-CM | POA: Diagnosis not present

## 2014-11-14 DIAGNOSIS — H01003 Unspecified blepharitis right eye, unspecified eyelid: Secondary | ICD-10-CM | POA: Diagnosis not present

## 2014-11-14 DIAGNOSIS — H04203 Unspecified epiphora, bilateral lacrimal glands: Secondary | ICD-10-CM | POA: Diagnosis not present

## 2014-11-21 ENCOUNTER — Encounter: Payer: Self-pay | Admitting: Family Medicine

## 2014-11-21 ENCOUNTER — Ambulatory Visit (INDEPENDENT_AMBULATORY_CARE_PROVIDER_SITE_OTHER): Payer: Medicare Other | Admitting: Family Medicine

## 2014-11-21 VITALS — BP 132/68 | HR 70 | Temp 98.1°F | Wt 182.0 lb

## 2014-11-21 DIAGNOSIS — I10 Essential (primary) hypertension: Secondary | ICD-10-CM

## 2014-11-21 DIAGNOSIS — E785 Hyperlipidemia, unspecified: Secondary | ICD-10-CM

## 2014-11-21 DIAGNOSIS — J069 Acute upper respiratory infection, unspecified: Secondary | ICD-10-CM | POA: Diagnosis not present

## 2014-11-21 DIAGNOSIS — B9789 Other viral agents as the cause of diseases classified elsewhere: Secondary | ICD-10-CM

## 2014-11-21 MED ORDER — AMLODIPINE BESYLATE 2.5 MG PO TABS: 2.5000 mg | ORAL_TABLET | Freq: Every day | ORAL | Status: DC

## 2014-11-21 MED ORDER — HYDROCODONE-HOMATROPINE 5-1.5 MG/5ML PO SYRP: 5.0000 mL | ORAL_SOLUTION | Freq: Four times a day (QID) | ORAL | Status: AC | PRN

## 2014-11-21 NOTE — Patient Instructions (Signed)
Upper Respiratory Infection, Adult An upper respiratory infection (URI) is also sometimes known as the common cold. The upper respiratory tract includes the nose, sinuses, throat, trachea, and bronchi. Bronchi are the airways leading to the lungs. Most people improve within 1 week, but symptoms can last up to 2 weeks. A residual cough may last even longer.  CAUSES Many different viruses can infect the tissues lining the upper respiratory tract. The tissues become irritated and inflamed and often become very moist. Mucus production is also common. A cold is contagious. You can easily spread the virus to others by oral contact. This includes kissing, sharing a glass, coughing, or sneezing. Touching your mouth or nose and then touching a surface, which is then touched by another person, can also spread the virus. SYMPTOMS  Symptoms typically develop 1 to 3 days after you come in contact with a cold virus. Symptoms vary from person to person. They may include:  Runny nose.  Sneezing.  Nasal congestion.  Sinus irritation.  Sore throat.  Loss of voice (laryngitis).  Cough.  Fatigue.  Muscle aches.  Loss of appetite.  Headache.  Low-grade fever. DIAGNOSIS  You might diagnose your own cold based on familiar symptoms, since most people get a cold 2 to 3 times a year. Your caregiver can confirm this based on your exam. Most importantly, your caregiver can check that your symptoms are not due to another disease such as strep throat, sinusitis, pneumonia, asthma, or epiglottitis. Blood tests, throat tests, and X-rays are not necessary to diagnose a common cold, but they may sometimes be helpful in excluding other more serious diseases. Your caregiver will decide if any further tests are required. RISKS AND COMPLICATIONS  You may be at risk for a more severe case of the common cold if you smoke cigarettes, have chronic heart disease (such as heart failure) or lung disease (such as asthma), or if  you have a weakened immune system. The very young and very old are also at risk for more serious infections. Bacterial sinusitis, middle ear infections, and bacterial pneumonia can complicate the common cold. The common cold can worsen asthma and chronic obstructive pulmonary disease (COPD). Sometimes, these complications can require emergency medical care and may be life-threatening. PREVENTION  The best way to protect against getting a cold is to practice good hygiene. Avoid oral or hand contact with people with cold symptoms. Wash your hands often if contact occurs. There is no clear evidence that vitamin C, vitamin E, echinacea, or exercise reduces the chance of developing a cold. However, it is always recommended to get plenty of rest and practice good nutrition. TREATMENT  Treatment is directed at relieving symptoms. There is no cure. Antibiotics are not effective, because the infection is caused by a virus, not by bacteria. Treatment may include:  Increased fluid intake. Sports drinks offer valuable electrolytes, sugars, and fluids.  Breathing heated mist or steam (vaporizer or shower).  Eating chicken soup or other clear broths, and maintaining good nutrition.  Getting plenty of rest.  Using gargles or lozenges for comfort.  Controlling fevers with ibuprofen or acetaminophen as directed by your caregiver.  Increasing usage of your inhaler if you have asthma. Zinc gel and zinc lozenges, taken in the first 24 hours of the common cold, can shorten the duration and lessen the severity of symptoms. Pain medicines may help with fever, muscle aches, and throat pain. A variety of non-prescription medicines are available to treat congestion and runny nose. Your caregiver   can make recommendations and may suggest nasal or lung inhalers for other symptoms.  HOME CARE INSTRUCTIONS   Only take over-the-counter or prescription medicines for pain, discomfort, or fever as directed by your  caregiver.  Use a warm mist humidifier or inhale steam from a shower to increase air moisture. This may keep secretions moist and make it easier to breathe.  Drink enough water and fluids to keep your urine clear or pale yellow.  Rest as needed.  Return to work when your temperature has returned to normal or as your caregiver advises. You may need to stay home longer to avoid infecting others. You can also use a face mask and careful hand washing to prevent spread of the virus. SEEK MEDICAL CARE IF:   After the first few days, you feel you are getting worse rather than better.  You need your caregiver's advice about medicines to control symptoms.  You develop chills, worsening shortness of breath, or brown or red sputum. These may be signs of pneumonia.  You develop yellow or brown nasal discharge or pain in the face, especially when you bend forward. These may be signs of sinusitis.  You develop a fever, swollen neck glands, pain with swallowing, or white areas in the back of your throat. These may be signs of strep throat. SEEK IMMEDIATE MEDICAL CARE IF:   You have a fever.  You develop severe or persistent headache, ear pain, sinus pain, or chest pain.  You develop wheezing, a prolonged cough, cough up blood, or have a change in your usual mucus (if you have chronic lung disease).  You develop sore muscles or a stiff neck. Document Released: 02/04/2001 Document Revised: 11/03/2011 Document Reviewed: 11/16/2013 [employer] Patient Information 2015 [employer]. This information is not intended to replace advice given to you by your health care provider. Make sure you discuss any questions you have with your health care provider.  

## 2014-11-21 NOTE — Progress Notes (Signed)
Subjective:    Patient ID: [name], male    DOB: May 01, 1932, 81 y.o.   MRN: [id number]  HPI Patient seen for medical follow-up. He has history of spinal stenosis, hypertension, hyperlipidemia, atrial fibrillation with previous ablation procedure. He's been monitoring blood pressures and has had frequent readings 546-503 systolic and 54S to 56C diastolic. No headaches. Currently takes nadolol and low-dose furosemide. He had previous bump in creatinine with losartan. No consistent alcohol use. Compliant with therapy  Hyperlipidemia treated with lovastatin. Overdue for lipids. No history of CAD or peripheral vascular disease.  Acute new issue of one day history of cough. Also has sore throat. Increased malaise. Body aches. Sinus pressure occasional headaches. Cough was severe last night and not relieved with over-the-counter medications.  Past Medical History  Diagnosis Date  . CARCINOMA, SKIN, SQUAMOUS CELL 09/11/2009  . HYPERLIPIDEMIA 03/13/2009  . INSOMNIA, CHRONIC 09/11/2009  . HYPERTENSION 03/13/2009  . Atrial fibrillation 04/07/2009  . PULMONARY NODULE 09/11/2009  . GERD 03/13/2009  . Rosacea 03/13/2009  . Actinic keratosis 03/13/2009  . SKIN CANCER, HX OF 03/13/2009  . COLONIC POLYPS, HX OF 03/13/2009  . BENIGN PROSTATIC HYPERTROPHY, HX OF 04/07/2009  . POLYPECTOMY, HX OF 04/07/2009  . Dysrhythmia     hx afib  . Arthritis    Past Surgical History  Procedure Laterality Date  . Lumbar disc surgery      RUPTURE  . Polypectomy    . Ablation  2010    x2  . Eye surgery Bilateral     cataracts  . Colonoscopy w/ biopsies and polypectomy    . Skin cancer excision    . Lumbar laminectomy/decompression microdiscectomy N/A 04/20/2014    Procedure: LUMBAR TWO-THREE, LUMBAR THREE-FOUR, LUMAR FOUR-FIVE LAMINECTOMIES;  Surgeon: Newman Pies, MD;  Location: [place] [facility];  Service: Neurosurgery;  Laterality: N/A;    reports that he quit smoking about 26 years ago. His smoking use  included Cigarettes. He has a 30 pack-year smoking history. His smokeless tobacco use includes Chew. He reports that he does not drink alcohol or use illicit drugs. family history includes Cancer in his mother; Cancer (age of onset: 25) in his father; Heart disease in his brother and sister. Allergies  Allergen Reactions  . Amoxicillin-Pot Clavulanate Diarrhea and Nausea Only  . Losartan Other (See Comments)    Elevated creatinine levels      Review of Systems  Constitutional: Positive for fatigue.  HENT: Positive for congestion and sinus pressure.   Eyes: Negative for visual disturbance.  Respiratory: Positive for cough. Negative for chest tightness and shortness of breath.   Cardiovascular: Negative for chest pain, palpitations and leg swelling.  Endocrine: Negative for polydipsia and polyuria.  Neurological: Positive for headaches. Negative for dizziness, syncope, weakness and light-headedness.       Objective:    Physical Exam  Constitutional: He is oriented to person, place, and time. He appears well-developed and well-nourished. No distress.  HENT:  Right Ear: External ear normal.  Left Ear: External ear normal.  Mouth/Throat: Oropharynx is clear and moist.  Neck: Neck supple. No thyromegaly present.  Cardiovascular: Normal rate and regular rhythm.   Pulmonary/Chest: Effort normal and breath sounds normal. No respiratory distress. He has no wheezes. He has no rales.  Musculoskeletal: He exhibits no edema.  Neurological: He is alert and oriented to person, place, and time.          Assessment & Plan:  #1 hypertension. Poorly controlled. Add amlodipine 2.5 mg  once daily. Reassess blood pressure one month and check basic metabolic panel then #2 hyperlipidemia. Continue lovastatin. Patient deferred labs today because of acute illness. Will recheck in one month  #3 acute viral URI with cough. Nonfocal exam. Hycodan cough syrup 1 teaspoon daily at bedtime for severe  cough.

## 2014-12-21 DIAGNOSIS — H903 Sensorineural hearing loss, bilateral: Secondary | ICD-10-CM | POA: Diagnosis not present

## 2014-12-22 ENCOUNTER — Ambulatory Visit (INDEPENDENT_AMBULATORY_CARE_PROVIDER_SITE_OTHER): Payer: Medicare Other | Admitting: Family Medicine

## 2014-12-22 ENCOUNTER — Encounter: Payer: Self-pay | Admitting: Family Medicine

## 2014-12-22 VITALS — BP 130/72 | HR 61 | Temp 97.9°F | Wt 179.0 lb

## 2014-12-22 DIAGNOSIS — N183 Chronic kidney disease, stage 3 unspecified: Secondary | ICD-10-CM

## 2014-12-22 DIAGNOSIS — I10 Essential (primary) hypertension: Secondary | ICD-10-CM

## 2014-12-22 DIAGNOSIS — E785 Hyperlipidemia, unspecified: Secondary | ICD-10-CM | POA: Diagnosis not present

## 2014-12-22 DIAGNOSIS — Z23 Encounter for immunization: Secondary | ICD-10-CM | POA: Diagnosis not present

## 2014-12-22 LAB — LIPID PANEL
Cholesterol: 127 mg/dL (ref 0–200)
HDL: 30.3 mg/dL — AB (ref >39.00)
LDL Cholesterol: 64 mg/dL (ref 0–99)
NONHDL: 96.7
TRIGLYCERIDES: 164 mg/dL — AB (ref 0.0–149.0)
Total CHOL/HDL Ratio: 4
VLDL: 32.8 mg/dL (ref 0.0–40.0)

## 2014-12-22 LAB — BASIC METABOLIC PANEL
BUN: 15 mg/dL (ref 6–23)
CALCIUM: 9.5 mg/dL (ref 8.4–10.5)
CO2: 28 meq/L (ref 19–32)
CREATININE: 1.18 mg/dL (ref 0.40–1.50)
Chloride: 104 mEq/L (ref 96–112)
GFR: 63 mL/min (ref >60.00)
Glucose, Bld: 111 mg/dL — ABNORMAL HIGH (ref 70–99)
POTASSIUM: 4.1 meq/L (ref 3.5–5.1)
Sodium: 137 mEq/L (ref 135–145)

## 2014-12-22 LAB — HEPATIC FUNCTION PANEL
ALBUMIN: 3.9 g/dL (ref 3.5–5.2)
ALK PHOS: 76 U/L (ref 39–117)
ALT: 9 U/L (ref 0–53)
AST: 16 U/L (ref 0–37)
Bilirubin, Direct: 0.1 mg/dL (ref 0.0–0.3)
Total Bilirubin: 0.7 mg/dL (ref 0.2–1.2)
Total Protein: 6.5 g/dL (ref 6.0–8.3)

## 2014-12-22 NOTE — Progress Notes (Signed)
Pre visit review using our clinic review tool, if applicable. No additional management support is needed unless otherwise documented below in the visit note. 

## 2014-12-22 NOTE — Progress Notes (Signed)
   Subjective:    Patient ID: [name], male    DOB: 1932/05/29, 81 y.o.   MRN: [id number]  HPI Patient here for follow-up regarding several items  Hypertension. Poorly controlled last visit. Added amlodipine 2.5 mg once daily. No side effects. But pressure much better controlled by home readings. Compliant with therapy. No headaches or dizziness  Past history of atrial fibrillation. He had ablation procedures had no recurrence since then. Takes lovastatin for hyperlipidemia. Last lipids were over a year ago. No myalgias.  Chronic kidney disease. Baseline creatinine around 1.3. He had bump in creatinine last year on losartan. No nonsteroidal use.  Needs Prevnar 13. Had Pneumovax about 3 years ago.  Past Medical History  Diagnosis Date  . CARCINOMA, SKIN, SQUAMOUS CELL 09/11/2009  . HYPERLIPIDEMIA 03/13/2009  . INSOMNIA, CHRONIC 09/11/2009  . HYPERTENSION 03/13/2009  . Atrial fibrillation 04/07/2009  . PULMONARY NODULE 09/11/2009  . GERD 03/13/2009  . Rosacea 03/13/2009  . Actinic keratosis 03/13/2009  . SKIN CANCER, HX OF 03/13/2009  . COLONIC POLYPS, HX OF 03/13/2009  . BENIGN PROSTATIC HYPERTROPHY, HX OF 04/07/2009  . POLYPECTOMY, HX OF 04/07/2009  . Dysrhythmia     hx afib  . Arthritis    Past Surgical History  Procedure Laterality Date  . Lumbar disc surgery      RUPTURE  . Polypectomy    . Ablation  2010    x2  . Eye surgery Bilateral     cataracts  . Colonoscopy w/ biopsies and polypectomy    . Skin cancer excision    . Lumbar laminectomy/decompression microdiscectomy N/A 04/20/2014    Procedure: LUMBAR TWO-THREE, LUMBAR THREE-FOUR, LUMAR FOUR-FIVE LAMINECTOMIES;  Surgeon: Newman Pies, MD;  Location: [place] [facility];  Service: Neurosurgery;  Laterality: N/A;    reports that he quit smoking about 26 years ago. His smoking use included Cigarettes. He has a 30 pack-year smoking history. His smokeless tobacco use includes Chew. He reports that he does not drink alcohol or  use illicit drugs. family history includes Cancer in his mother; Cancer (age of onset: 100) in his father; Heart disease in his brother and sister. Allergies  Allergen Reactions  . Amoxicillin-Pot Clavulanate Diarrhea and Nausea Only  . Losartan Other (See Comments)    Elevated creatinine levels      Review of Systems  Constitutional: Negative for fatigue and unexpected weight change.  Eyes: Negative for visual disturbance.  Respiratory: Negative for cough, chest tightness and shortness of breath.   Cardiovascular: Negative for chest pain, palpitations and leg swelling.  Neurological: Negative for dizziness, syncope, weakness, light-headedness and headaches.       Objective:   Physical Exam  Constitutional: He appears well-developed and well-nourished.  Neck: Neck supple. No JVD present. No thyromegaly present.  Cardiovascular: Normal rate and regular rhythm.  Exam reveals no gallop.   Pulmonary/Chest: Effort normal and breath sounds normal. No respiratory distress. He has no wheezes. He has no rales.  Musculoskeletal: He exhibits no edema.          Assessment & Plan:  #1 hypertension. Improved. Continue amlodipine 2.5 mg daily #2 chronic kidney disease, stage III. Recheck basic metabolic panel. Avoid nonsteroidals #3 hyperlipidemia. Patient on lovastatin. Check lipid and hepatic panel. #4 health maintenance. Prevnar 13 given. Continue yearly flu vaccine.

## 2014-12-23 ENCOUNTER — Encounter: Payer: Self-pay | Admitting: Family Medicine

## 2014-12-25 ENCOUNTER — Encounter: Payer: Self-pay | Admitting: Family Medicine

## 2014-12-26 ENCOUNTER — Other Ambulatory Visit: Payer: Self-pay | Admitting: Family Medicine

## 2014-12-26 DIAGNOSIS — H9190 Unspecified hearing loss, unspecified ear: Secondary | ICD-10-CM

## 2015-01-18 ENCOUNTER — Encounter: Payer: Self-pay | Admitting: Family Medicine

## 2015-01-18 ENCOUNTER — Other Ambulatory Visit: Payer: Self-pay

## 2015-01-18 MED ORDER — AMLODIPINE BESYLATE 2.5 MG PO TABS: 2.5000 mg | ORAL_TABLET | Freq: Every day | ORAL | Status: DC

## 2015-02-01 IMAGING — US US RENAL
1 series · 14 of 25 positions shown · non-contrast
Comparison: None

CLINICAL DATA: Acute renal failure, history hypertension

EXAM:
RENAL/URINARY TRACT ULTRASOUND COMPLETE

[Series 1: us renal · 0.28mm/px · 14 of 38 slices shown]
[im 1/38]
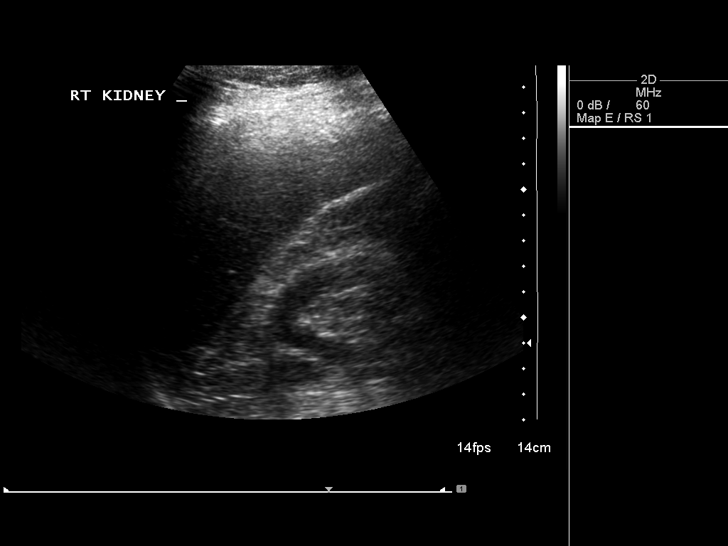
[im 4/38]
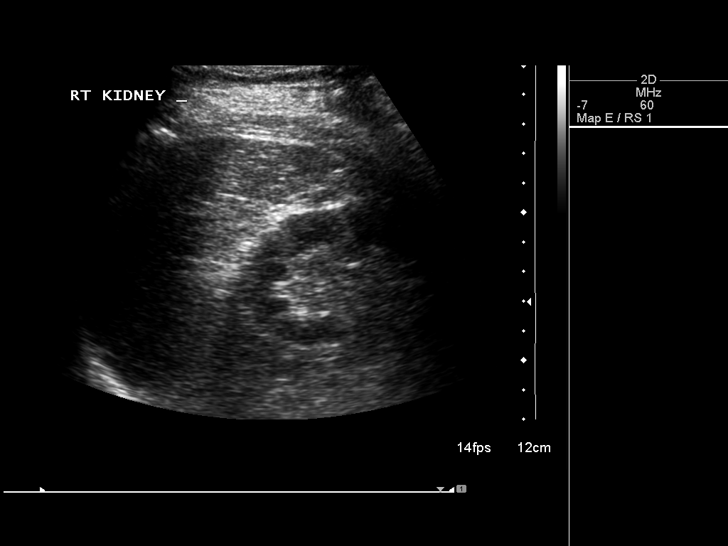
[im 7/38]
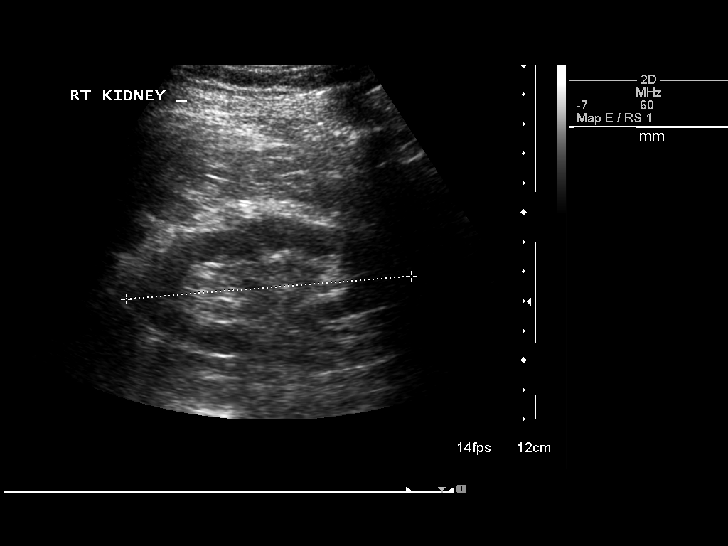
[im 10/38]
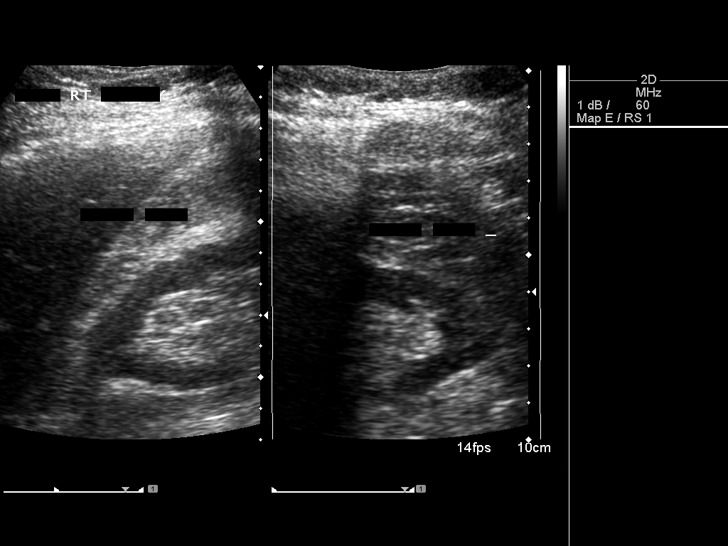
[im 13/38]
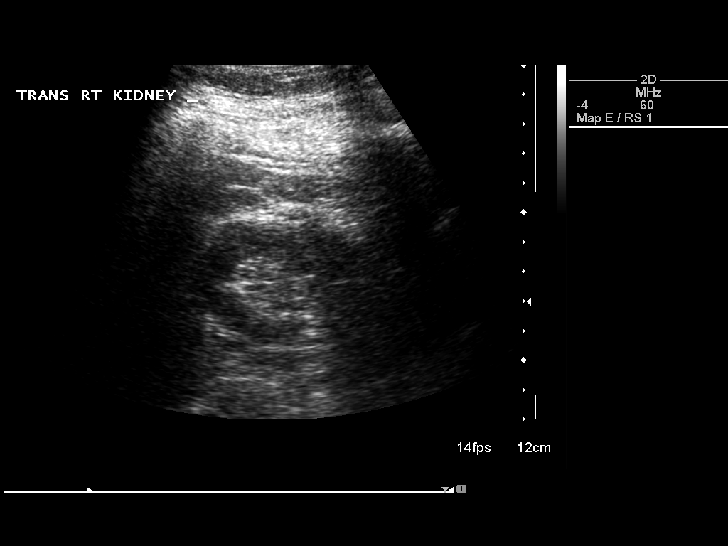
[im 14/38]
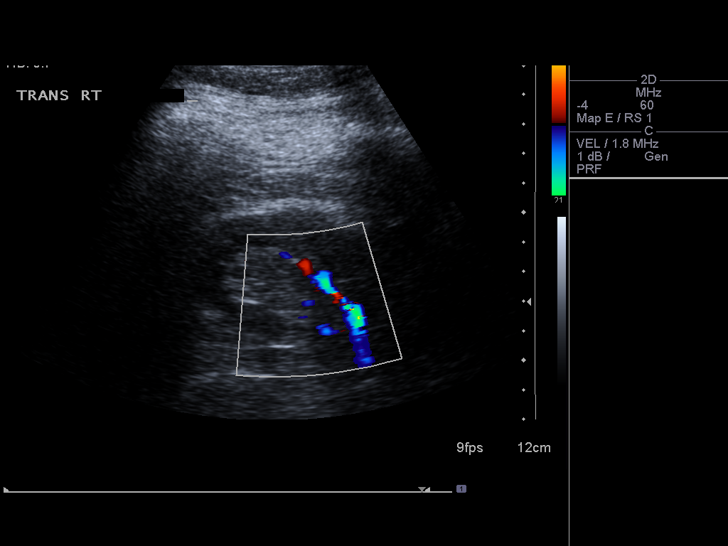
[im 17/38]
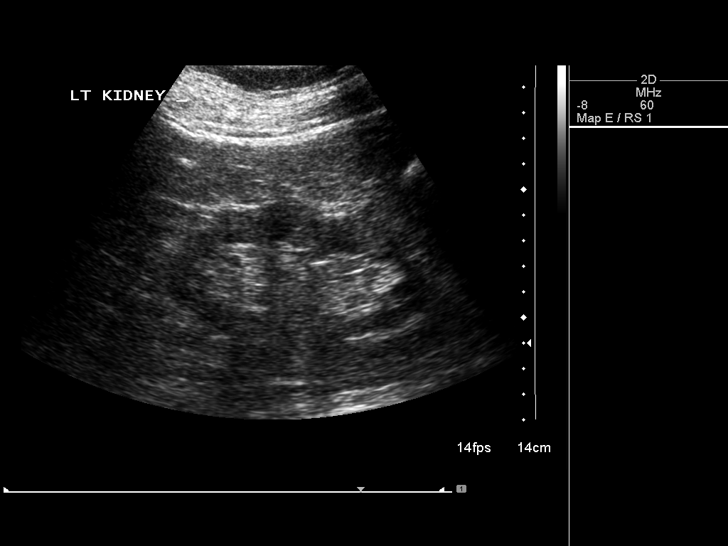
[im 21/38]
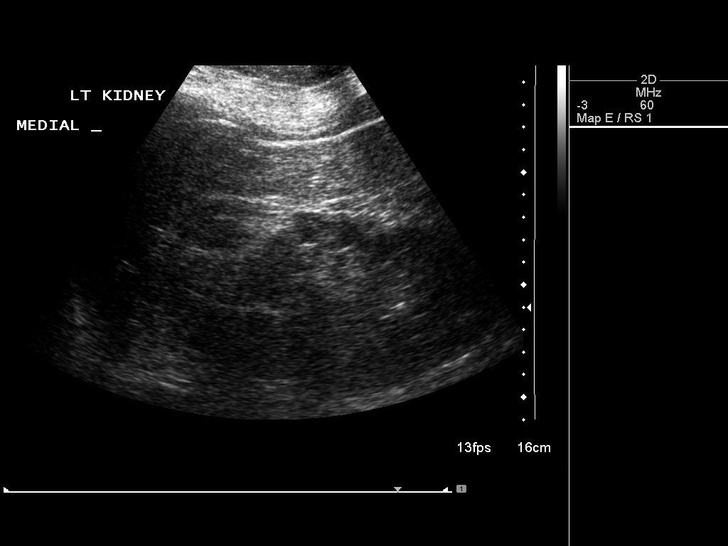
[im 24/38]
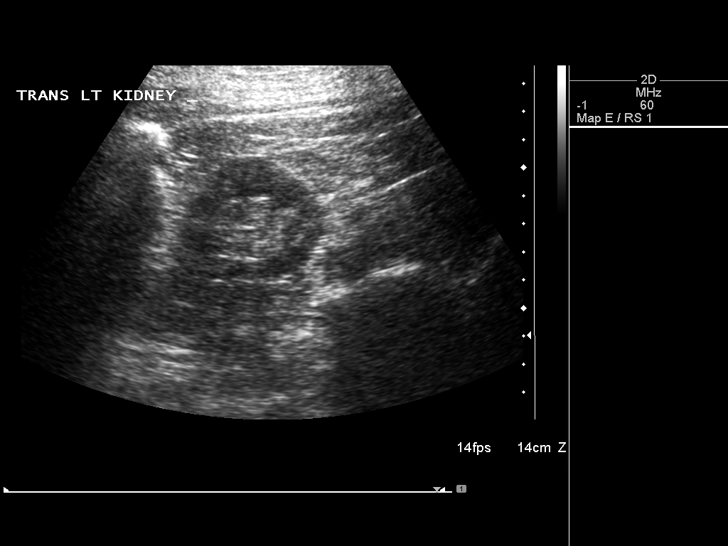
[im 25/38]
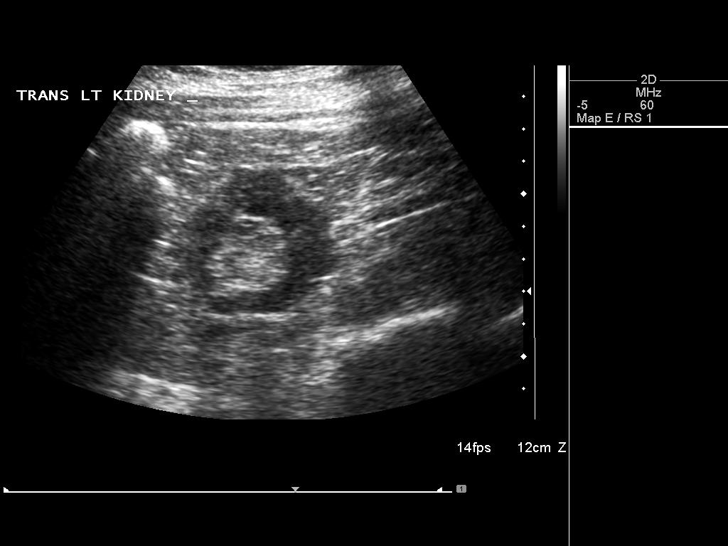
[im 28/38]
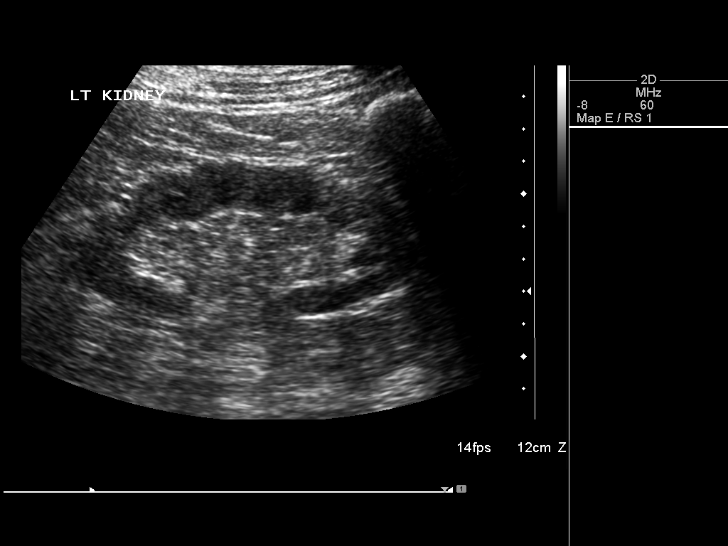
[im 31/38]
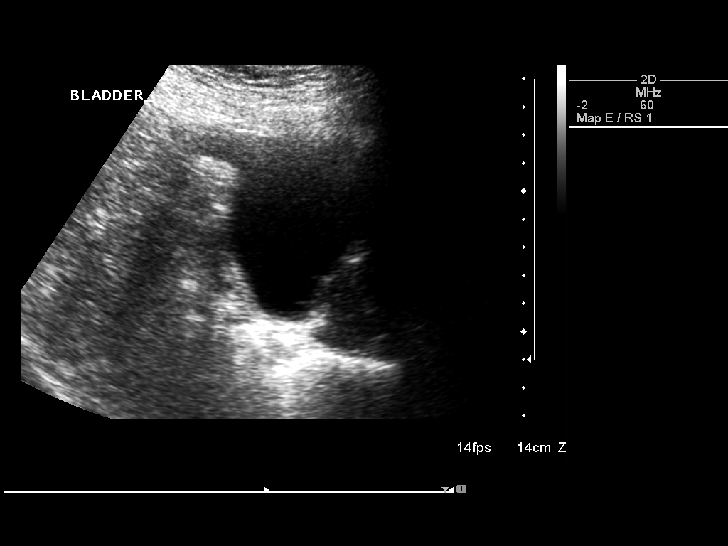
[im 34/38]
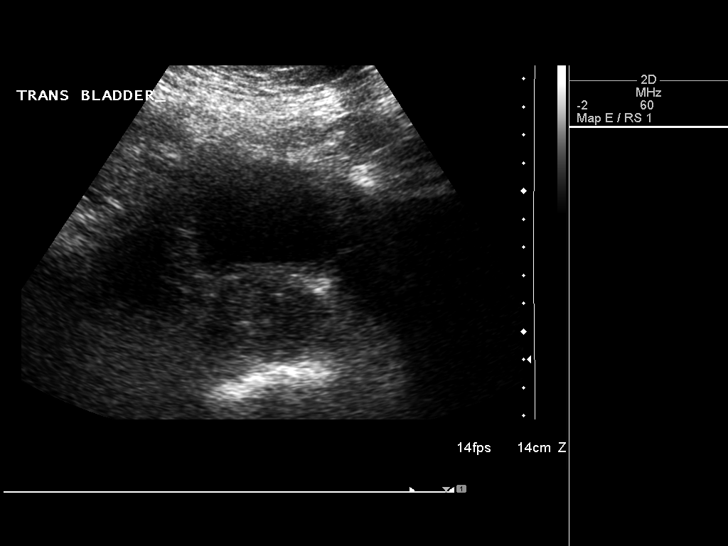
[im 38/38]
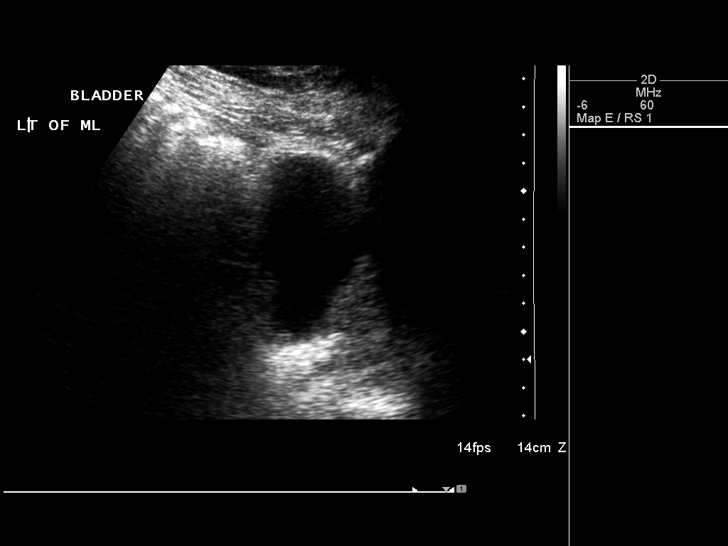

[14 of 25 positions shown; findings below may reference images not displayed]

FINDINGS: Right Kidney:

Length: 9.7 cm. Cortical thinning. Normal cortical echogenicity. No
mass, hydronephrosis or shadowing calcification. No perinephric
fluid.

Left Kidney:

Length: 10.7 cm. Cortical thinning. Normal cortical echogenicity. No
mass, hydronephrosis or shadowing calcification. No perinephric
fluid.

Bladder:

Normal appearance.
IMPRESSION: Age-related renal cortical atrophy.

Otherwise negative exam.

## 2015-02-10 ENCOUNTER — Other Ambulatory Visit: Payer: Self-pay | Admitting: Family Medicine

## 2015-02-23 ENCOUNTER — Other Ambulatory Visit: Payer: Self-pay | Admitting: Family Medicine

## 2015-02-23 DIAGNOSIS — H40013 Open angle with borderline findings, low risk, bilateral: Secondary | ICD-10-CM | POA: Diagnosis not present

## 2015-04-05 ENCOUNTER — Other Ambulatory Visit: Payer: Self-pay | Admitting: Family Medicine

## 2015-04-20 DIAGNOSIS — Z85828 Personal history of other malignant neoplasm of skin: Secondary | ICD-10-CM | POA: Diagnosis not present

## 2015-04-20 DIAGNOSIS — L821 Other seborrheic keratosis: Secondary | ICD-10-CM | POA: Diagnosis not present

## 2015-04-20 DIAGNOSIS — Z85820 Personal history of malignant melanoma of skin: Secondary | ICD-10-CM | POA: Diagnosis not present

## 2015-04-20 DIAGNOSIS — D485 Neoplasm of uncertain behavior of skin: Secondary | ICD-10-CM | POA: Diagnosis not present

## 2015-04-20 DIAGNOSIS — D1801 Hemangioma of skin and subcutaneous tissue: Secondary | ICD-10-CM | POA: Diagnosis not present

## 2015-04-20 DIAGNOSIS — C4442 Squamous cell carcinoma of skin of scalp and neck: Secondary | ICD-10-CM | POA: Diagnosis not present

## 2015-04-20 DIAGNOSIS — D0472 Carcinoma in situ of skin of left lower limb, including hip: Secondary | ICD-10-CM | POA: Diagnosis not present

## 2015-04-20 DIAGNOSIS — L570 Actinic keratosis: Secondary | ICD-10-CM | POA: Diagnosis not present

## 2015-04-20 DIAGNOSIS — D0462 Carcinoma in situ of skin of left upper limb, including shoulder: Secondary | ICD-10-CM | POA: Diagnosis not present

## 2015-04-20 DIAGNOSIS — C44629 Squamous cell carcinoma of skin of left upper limb, including shoulder: Secondary | ICD-10-CM | POA: Diagnosis not present

## 2015-04-25 ENCOUNTER — Other Ambulatory Visit: Payer: Self-pay | Admitting: Family Medicine

## 2015-05-08 ENCOUNTER — Other Ambulatory Visit: Payer: Self-pay | Admitting: Family Medicine

## 2015-05-18 DIAGNOSIS — L570 Actinic keratosis: Secondary | ICD-10-CM | POA: Diagnosis not present

## 2015-05-22 DIAGNOSIS — D044 Carcinoma in situ of skin of scalp and neck: Secondary | ICD-10-CM | POA: Diagnosis not present

## 2015-05-22 DIAGNOSIS — C4442 Squamous cell carcinoma of skin of scalp and neck: Secondary | ICD-10-CM | POA: Diagnosis not present

## 2015-06-05 DIAGNOSIS — S0100XA Unspecified open wound of scalp, initial encounter: Secondary | ICD-10-CM | POA: Diagnosis not present

## 2015-06-20 ENCOUNTER — Encounter: Payer: Self-pay | Admitting: Family Medicine

## 2015-06-21 DIAGNOSIS — D0462 Carcinoma in situ of skin of left upper limb, including shoulder: Secondary | ICD-10-CM | POA: Diagnosis not present

## 2015-06-21 DIAGNOSIS — D0472 Carcinoma in situ of skin of left lower limb, including hip: Secondary | ICD-10-CM | POA: Diagnosis not present

## 2015-06-22 ENCOUNTER — Ambulatory Visit (INDEPENDENT_AMBULATORY_CARE_PROVIDER_SITE_OTHER): Payer: Medicare Other | Admitting: Family Medicine

## 2015-06-22 VITALS — BP 140/70 | HR 59 | Temp 97.4°F | Wt 184.4 lb

## 2015-06-22 DIAGNOSIS — N400 Enlarged prostate without lower urinary tract symptoms: Secondary | ICD-10-CM | POA: Diagnosis not present

## 2015-06-22 DIAGNOSIS — Z23 Encounter for immunization: Secondary | ICD-10-CM | POA: Diagnosis not present

## 2015-06-22 DIAGNOSIS — I10 Essential (primary) hypertension: Secondary | ICD-10-CM | POA: Diagnosis not present

## 2015-06-22 DIAGNOSIS — E785 Hyperlipidemia, unspecified: Secondary | ICD-10-CM | POA: Diagnosis not present

## 2015-06-22 NOTE — Addendum Note (Signed)
Addended by: Ailene Rud E on: 06/22/2015 10:25 AM   Modules accepted: Orders

## 2015-06-22 NOTE — Progress Notes (Signed)
Subjective:    Patient ID: [name], male    DOB: [birth year], 81 y.o.   MRN: [id number]  HPI Patient seen for medical follow-up  Hypertension. Treated with nadolol and amlodipine. Does not monitor blood pressures at home. No headaches. No dyspnea. Sometimes has some mild peripheral edema and rarely takes furosemide for that. No recent chest pains. History of atrial fibrillation. He has been sinus rhythm since second ablation procedure many years ago.  Hyperlipidemia. Takes lovastatin. No myalgias. Lipids were at goal last spring. Compliant with therapy  BPH. Takes tamsulosin. No obstructive urinary symptoms. Occasional nocturia. No burning with urination.  Past Medical History  Diagnosis Date  . CARCINOMA, SKIN, SQUAMOUS CELL 09/11/2009  . HYPERLIPIDEMIA 03/13/2009  . INSOMNIA, CHRONIC 09/11/2009  . HYPERTENSION 03/13/2009  . Atrial fibrillation 04/07/2009  . PULMONARY NODULE 09/11/2009  . GERD 03/13/2009  . Rosacea 03/13/2009  . Actinic keratosis 03/13/2009  . SKIN CANCER, HX OF 03/13/2009  . COLONIC POLYPS, HX OF 03/13/2009  . BENIGN PROSTATIC HYPERTROPHY, HX OF 04/07/2009  . POLYPECTOMY, HX OF 04/07/2009  . Dysrhythmia     hx afib  . Arthritis    Past Surgical History  Procedure Laterality Date  . Lumbar disc surgery      RUPTURE  . Polypectomy    . Ablation  2010    x2  . Eye surgery Bilateral     cataracts  . Colonoscopy w/ biopsies and polypectomy    . Skin cancer excision    . Lumbar laminectomy/decompression microdiscectomy N/A 04/20/2014    Procedure: LUMBAR TWO-THREE, LUMBAR THREE-FOUR, LUMAR FOUR-FIVE LAMINECTOMIES;  Surgeon: Newman Pies, MD;  Location: [place] [facility];  Service: Neurosurgery;  Laterality: N/A;    reports that he quit smoking about 27 years ago. His smoking use included Cigarettes. He has a 30 pack-year smoking history. His smokeless tobacco use includes Chew. He reports that he does not drink alcohol or use illicit drugs. family history  includes Cancer in his mother; Cancer (age of onset: 89) in his father; Heart disease in his brother and sister. Allergies  Allergen Reactions  . Amoxicillin-Pot Clavulanate Diarrhea and Nausea Only  . Losartan Other (See Comments)    Elevated creatinine levels      Review of Systems  Constitutional: Negative for fatigue.  Eyes: Negative for visual disturbance.  Respiratory: Negative for cough, chest tightness and shortness of breath.   Cardiovascular: Negative for chest pain, palpitations and leg swelling.  Gastrointestinal: Negative for abdominal pain.  Endocrine: Negative for polydipsia and polyuria.  Genitourinary: Negative for dysuria and decreased urine volume.  Neurological: Negative for dizziness, syncope, weakness, light-headedness and headaches.       Objective:   Physical Exam  Constitutional: He is oriented to person, place, and time. He appears well-developed and well-nourished.  HENT:  Right Ear: External ear normal.  Left Ear: External ear normal.  Mouth/Throat: Oropharynx is clear and moist.  Eyes: Pupils are equal, round, and reactive to light.  Neck: Neck supple. No thyromegaly present.  Cardiovascular: Normal rate and regular rhythm.   Pulmonary/Chest: Effort normal and breath sounds normal. No respiratory distress. He has no wheezes. He has no rales.  Musculoskeletal: He exhibits edema.  Trace pitting edema lower legs bilaterally  Neurological: He is alert and oriented to person, place, and time.          Assessment & Plan:  #1 hypertension. Stable. Continue close monitoring. #2 hyperlipidemia. Continue lovastatin. Recheck lipid and hepatic panel in 6  months #3 BPH. Symptomatically stable. Continue tamsulosin #4 health maintenance. Flu vaccine given. Other immunizations up-to-date

## 2015-06-22 NOTE — Progress Notes (Signed)
Pre visit review using our clinic review tool, if applicable. No additional management support is needed unless otherwise documented below in the visit note. 

## 2015-07-18 ENCOUNTER — Encounter: Payer: Self-pay | Admitting: Family Medicine

## 2015-07-18 ENCOUNTER — Ambulatory Visit (INDEPENDENT_AMBULATORY_CARE_PROVIDER_SITE_OTHER): Payer: Medicare Other | Admitting: Family Medicine

## 2015-07-18 VITALS — BP 132/70 | HR 51 | Temp 98.3°F | Resp 20 | Ht 74.0 in | Wt 183.0 lb

## 2015-07-18 DIAGNOSIS — R42 Dizziness and giddiness: Secondary | ICD-10-CM | POA: Insufficient documentation

## 2015-07-18 DIAGNOSIS — E785 Hyperlipidemia, unspecified: Secondary | ICD-10-CM | POA: Diagnosis not present

## 2015-07-18 DIAGNOSIS — I10 Essential (primary) hypertension: Secondary | ICD-10-CM

## 2015-07-18 DIAGNOSIS — M5431 Sciatica, right side: Secondary | ICD-10-CM

## 2015-07-18 MED ORDER — NADOLOL 20 MG PO TABS: 20.0000 mg | ORAL_TABLET | Freq: Every day | ORAL | Status: DC

## 2015-07-18 MED ORDER — LOVASTATIN 20 MG PO TABS: 20.0000 mg | ORAL_TABLET | Freq: Every day | ORAL | Status: DC

## 2015-07-18 MED ORDER — GABAPENTIN 100 MG PO CAPS: 100.0000 mg | ORAL_CAPSULE | Freq: Two times a day (BID) | ORAL | Status: DC

## 2015-07-18 MED ORDER — FUROSEMIDE 20 MG PO TABS: 20.0000 mg | ORAL_TABLET | Freq: Every day | ORAL | Status: DC

## 2015-07-18 NOTE — Progress Notes (Signed)
Subjective:    Patient ID: [name], male    DOB: 14-Dec-1931, 81 y.o.   MRN: [id number]  HPI Patient here with several items  Ongoing back pain with right radiculopathy symptoms. He had multiple back surgeries. He's had several weeks of pains which are sharp radiating down the right lower extremity sometimes all the way to the foot. No recent injury. No urine or stool incontinence. No weakness. Sometimes pain is 10 out of 10 severity at its worst. Denies any appetite or weight changes. Taking gabapentin in the past and requesting refill.  Intermittent vertigo. No headaches. No hearing changes. Currently asymptomatic. Denies any lightheadedness or syncope. He has not had any recent vestibular rehabilitation  Hyperlipidemia and requesting refills of lovastatin. Denies any myalgias. He has some foot and lower leg edema and requesting refill furosemide which he uses infrequently. Recent electrolytes stable. No dyspnea or orthopnea. No recent chest pains.  Past Medical History  Diagnosis Date  . CARCINOMA, SKIN, SQUAMOUS CELL 09/11/2009  . HYPERLIPIDEMIA 03/13/2009  . INSOMNIA, CHRONIC 09/11/2009  . HYPERTENSION 03/13/2009  . Atrial fibrillation ([place]) 04/07/2009  . PULMONARY NODULE 09/11/2009  . GERD 03/13/2009  . Rosacea 03/13/2009  . Actinic keratosis 03/13/2009  . SKIN CANCER, HX OF 03/13/2009  . COLONIC POLYPS, HX OF 03/13/2009  . BENIGN PROSTATIC HYPERTROPHY, HX OF 04/07/2009  . POLYPECTOMY, HX OF 04/07/2009  . Dysrhythmia     hx afib  . Arthritis    Past Surgical History  Procedure Laterality Date  . Lumbar disc surgery      RUPTURE  . Polypectomy    . Ablation  2010    x2  . Eye surgery Bilateral     cataracts  . Colonoscopy w/ biopsies and polypectomy    . Skin cancer excision    . Lumbar laminectomy/decompression microdiscectomy N/A 04/20/2014    Procedure: LUMBAR TWO-THREE, LUMBAR THREE-FOUR, LUMAR FOUR-FIVE LAMINECTOMIES;  Surgeon: Newman Pies, MD;  Location: [place]  [facility];  Service: Neurosurgery;  Laterality: N/A;    reports that he quit smoking about 27 years ago. His smoking use included Cigarettes. He has a 30 pack-year smoking history. His smokeless tobacco use includes Chew. He reports that he does not drink alcohol or use illicit drugs. family history includes Cancer in his mother; Cancer (age of onset: 5) in his father; Heart disease in his brother and sister. Allergies  Allergen Reactions  . Amoxicillin-Pot Clavulanate Diarrhea and Nausea Only  . Losartan Other (See Comments)    Elevated creatinine levels       Review of Systems  Constitutional: Negative for fever, activity change and appetite change.  Respiratory: Negative for cough and shortness of breath.   Cardiovascular: Positive for leg swelling. Negative for chest pain and palpitations.  Gastrointestinal: Negative for vomiting and abdominal pain.  Genitourinary: Negative for dysuria, hematuria and flank pain.  Musculoskeletal: Positive for back pain. Negative for myalgias and joint swelling.  Neurological: Negative for weakness and numbness.       Objective:   Physical Exam  Constitutional: He is oriented to person, place, and time. He appears well-developed and well-nourished. No distress.  Neck: Neck supple.  Cardiovascular: Normal rate and regular rhythm.   Pulmonary/Chest: Effort normal and breath sounds normal. No respiratory distress. He has no wheezes. He has no rales.  Musculoskeletal:  Trace edema feet ankles bilaterally. Straight leg raise negative on the right  Neurological: He is alert and oriented to person, place, and time. He has normal  reflexes.  Full-strength lower extremities and normal sensory function to touch          Assessment & Plan:   #1 low back pain with right radiculitis symptoms. Refill gabapentin 100 mg twice a day. Reviewed possible side effects. If he develops any weakness or persistent symptoms he is encouraged to follow-up with his  neurosurgeon   #2 hyperlipidemia. Refill lovastatin. Check labs at follow-up   #3 chronic bilateral ankle and lower leg edema. Relatively stable. Refill furosemide which he uses when necessary.   #4 vertigo, recurrent -nonfocal exam currently. Consider vestibular rehabilitation if symptoms persist

## 2015-07-18 NOTE — Patient Instructions (Signed)

## 2015-07-18 NOTE — Progress Notes (Signed)
Pre visit review using our clinic review tool, if applicable. No additional management support is needed unless otherwise documented below in the visit note. 

## 2015-10-11 DIAGNOSIS — D485 Neoplasm of uncertain behavior of skin: Secondary | ICD-10-CM | POA: Diagnosis not present

## 2015-10-11 DIAGNOSIS — C44212 Basal cell carcinoma of skin of right ear and external auricular canal: Secondary | ICD-10-CM | POA: Diagnosis not present

## 2015-10-11 DIAGNOSIS — Z85828 Personal history of other malignant neoplasm of skin: Secondary | ICD-10-CM | POA: Diagnosis not present

## 2015-10-11 DIAGNOSIS — L821 Other seborrheic keratosis: Secondary | ICD-10-CM | POA: Diagnosis not present

## 2015-10-11 DIAGNOSIS — D1801 Hemangioma of skin and subcutaneous tissue: Secondary | ICD-10-CM | POA: Diagnosis not present

## 2015-10-11 DIAGNOSIS — C44329 Squamous cell carcinoma of skin of other parts of face: Secondary | ICD-10-CM | POA: Diagnosis not present

## 2015-10-11 DIAGNOSIS — D0462 Carcinoma in situ of skin of left upper limb, including shoulder: Secondary | ICD-10-CM | POA: Diagnosis not present

## 2015-10-11 DIAGNOSIS — L570 Actinic keratosis: Secondary | ICD-10-CM | POA: Diagnosis not present

## 2015-10-11 DIAGNOSIS — C44629 Squamous cell carcinoma of skin of left upper limb, including shoulder: Secondary | ICD-10-CM | POA: Diagnosis not present

## 2015-10-18 ENCOUNTER — Encounter: Payer: Self-pay | Admitting: Family Medicine

## 2015-10-18 ENCOUNTER — Ambulatory Visit (INDEPENDENT_AMBULATORY_CARE_PROVIDER_SITE_OTHER): Payer: Medicare Other | Admitting: Family Medicine

## 2015-10-18 VITALS — BP 130/70 | HR 59 | Temp 98.1°F | Ht 72.5 in | Wt 177.7 lb

## 2015-10-18 DIAGNOSIS — E785 Hyperlipidemia, unspecified: Secondary | ICD-10-CM

## 2015-10-18 DIAGNOSIS — R739 Hyperglycemia, unspecified: Secondary | ICD-10-CM | POA: Diagnosis not present

## 2015-10-18 DIAGNOSIS — I4891 Unspecified atrial fibrillation: Secondary | ICD-10-CM

## 2015-10-18 DIAGNOSIS — Z0000 Encounter for general adult medical examination without abnormal findings: Secondary | ICD-10-CM | POA: Diagnosis not present

## 2015-10-18 DIAGNOSIS — I10 Essential (primary) hypertension: Secondary | ICD-10-CM

## 2015-10-18 DIAGNOSIS — R7303 Prediabetes: Secondary | ICD-10-CM

## 2015-10-18 DIAGNOSIS — M5416 Radiculopathy, lumbar region: Secondary | ICD-10-CM

## 2015-10-18 DIAGNOSIS — M5417 Radiculopathy, lumbosacral region: Secondary | ICD-10-CM

## 2015-10-18 LAB — BASIC METABOLIC PANEL
BUN: 17 mg/dL (ref 6–23)
CALCIUM: 9.3 mg/dL (ref 8.4–10.5)
CO2: 30 meq/L (ref 19–32)
CREATININE: 1.27 mg/dL (ref 0.40–1.50)
Chloride: 103 mEq/L (ref 96–112)
GFR: 57.75 mL/min — AB (ref >60.00)
Glucose, Bld: 109 mg/dL — ABNORMAL HIGH (ref 70–99)
Potassium: 4.4 mEq/L (ref 3.5–5.1)
Sodium: 140 mEq/L (ref 135–145)

## 2015-10-18 LAB — LIPID PANEL
CHOL/HDL RATIO: 4
Cholesterol: 131 mg/dL (ref 0–200)
HDL: 31.4 mg/dL — ABNORMAL LOW (ref >39.00)
LDL Cholesterol: 68 mg/dL (ref 0–99)
NonHDL: 99.26
Triglycerides: 157 mg/dL — ABNORMAL HIGH (ref 0.0–149.0)
VLDL: 31.4 mg/dL (ref 0.0–40.0)

## 2015-10-18 LAB — HEPATIC FUNCTION PANEL
ALT: 11 U/L (ref 0–53)
AST: 18 U/L (ref 0–37)
Albumin: 4.2 g/dL (ref 3.5–5.2)
Alkaline Phosphatase: 71 U/L (ref 39–117)
BILIRUBIN TOTAL: 0.9 mg/dL (ref 0.2–1.2)
Bilirubin, Direct: 0.2 mg/dL (ref 0.0–0.3)
Total Protein: 6.7 g/dL (ref 6.0–8.3)

## 2015-10-18 LAB — HEMOGLOBIN A1C: Hgb A1c MFr Bld: 6 % (ref 4.6–6.5)

## 2015-10-18 MED ORDER — METOPROLOL SUCCINATE ER 25 MG PO TB24: 25.0000 mg | ORAL_TABLET | Freq: Every day | ORAL | Status: DC

## 2015-10-18 NOTE — Progress Notes (Signed)
Subjective:    Patient ID: [name], male    DOB: [birth year], 82 y.o.   MRN: [id number]  HPI Patient seen for Medicare wellness visit and medical follow-up. Chronic problems include remote history of atrial fibrillation, BPH, recurrent skin cancer followed by dermatology, chronic kidney disease stage III, hypertension, GERD, hyperlipidemia, lumbar stenosis, history of prediabetes, rosacea.  Medications reviewed. Blood pressure stable. Takes low-dose amlodipine. Has been on nadolol for years but states this is costing him a lot of money and would like to look at less expensive beta blocker if possible.  Recently had some right lower back pain radiating down the right lower extremity. Better fast few days. No weakness. No urine or stool incontinence. He described sharp pain which radiates all the way to the foot at times. Had previous back surgery. No appetite or weight changes.  Frequently feels "off balance". Has had a couple falls over the past year but no associated injury. No syncope. No chest pains. Past history of atrial fibrillation with ablation procedure and has been in sinus rhythm now for several years.  Immunizations up-to-date.  Past Medical History  Diagnosis Date  . CARCINOMA, SKIN, SQUAMOUS CELL 09/11/2009  . HYPERLIPIDEMIA 03/13/2009  . INSOMNIA, CHRONIC 09/11/2009  . HYPERTENSION 03/13/2009  . Atrial fibrillation ([place]) 04/07/2009  . PULMONARY NODULE 09/11/2009  . GERD 03/13/2009  . Rosacea 03/13/2009  . Actinic keratosis 03/13/2009  . SKIN CANCER, HX OF 03/13/2009  . COLONIC POLYPS, HX OF 03/13/2009  . BENIGN PROSTATIC HYPERTROPHY, HX OF 04/07/2009  . POLYPECTOMY, HX OF 04/07/2009  . Dysrhythmia     hx afib  . Arthritis    Past Surgical History  Procedure Laterality Date  . Lumbar disc surgery      RUPTURE  . Polypectomy    . Ablation  2010    x2  . Eye surgery Bilateral     cataracts  . Colonoscopy w/ biopsies and polypectomy    . Skin cancer excision    .  Lumbar laminectomy/decompression microdiscectomy N/A 04/20/2014    Procedure: LUMBAR TWO-THREE, LUMBAR THREE-FOUR, LUMAR FOUR-FIVE LAMINECTOMIES;  Surgeon: Newman Pies, MD;  Location: [place] [facility];  Service: Neurosurgery;  Laterality: N/A;    reports that he quit smoking about 27 years ago. His smoking use included Cigarettes. He has a 30 pack-year smoking history. His smokeless tobacco use includes Chew. He reports that he does not drink alcohol or use illicit drugs. family history includes Cancer in his mother; Cancer (age of onset: 50) in his father; Heart disease in his brother and sister. Allergies  Allergen Reactions  . Amoxicillin-Pot Clavulanate Diarrhea and Nausea Only  . Losartan Other (See Comments)    Elevated creatinine levels   1.  Risk factors based on Past Medical , Social, and Family history reviewed and as indicated above with no changes 2.  Limitations in physical activities None.  He estimates about 4 falls during the past year. He declines physical therapy at this time. 3.  Depression/mood No active depression or anxiety issues 4.  Hearing bilateral hearing aids followed by audiology 5.  ADLs independent in all. 6.  Cognitive function (orientation to time and place, language, writing, speech,memory) no short or long term memory issues.  Language and judgement intact. 7.  Home Safety no issues other than high risk for fall. We discussed prevention issues 8.  Height, weight, and visual acuity.all stable. 9.  Counseling discussed fall prevention. 10. Recommendation of preventive services. None indicated at this  time. Continue yearly flu vaccine 11. Labs based on risk factors lipid, hepatic, basic metabolic panel, hemoglobin A1c 12. Care Plan as above 13. Other Providers Dr Jenkins-Neurosurgery. 14. Written schedule of screening/prevention services given to patient.     Review of Systems  Constitutional: Negative for fever, activity change, appetite change and  fatigue.  HENT: Negative for congestion, ear pain and trouble swallowing.   Eyes: Negative for pain and visual disturbance.  Respiratory: Negative for cough, shortness of breath and wheezing.   Cardiovascular: Negative for chest pain and palpitations.  Gastrointestinal: Negative for nausea, vomiting, abdominal pain, diarrhea, constipation, blood in stool, abdominal distention and rectal pain.  Genitourinary: Negative for dysuria, hematuria and testicular pain.  Musculoskeletal: Positive for back pain. Negative for joint swelling and neck pain.  Skin: Negative for rash.  Neurological: Negative for dizziness, syncope, weakness and headaches.  Hematological: Negative for adenopathy.  Psychiatric/Behavioral: Negative for confusion, sleep disturbance and dysphoric mood.       Objective:   Physical Exam  Constitutional: He is oriented to person, place, and time. He appears well-developed and well-nourished. No distress.  HENT:  Head: Normocephalic and atraumatic.  Right Ear: External ear normal.  Left Ear: External ear normal.  Mouth/Throat: Oropharynx is clear and moist.  Eyes: Conjunctivae and EOM are normal. Pupils are equal, round, and reactive to light.  Neck: Normal range of motion. Neck supple. No thyromegaly present.  Cardiovascular: Normal rate, regular rhythm and normal heart sounds.   No murmur heard. Pulmonary/Chest: No respiratory distress. He has no wheezes. He has no rales.  Abdominal: Soft. Bowel sounds are normal. He exhibits no distension and no mass. There is no tenderness. There is no rebound and no guarding.  Musculoskeletal: He exhibits no edema.  Lymphadenopathy:    He has no cervical adenopathy.  Neurological: He is alert and oriented to person, place, and time. He displays normal reflexes. No cranial nerve deficit.  Skin:  He has multiple crusted areas involving his face and scalp from recent biopsies per dermatology  Psychiatric: He has a normal mood and affect.           Assessment & Plan:  #1 health maintenance. Vaccines up-to-date. He has decided against any further colonoscopies.   #2 moderate to high risk of falls. We offered physical therapy for fall prevention and he is not interested this time  #3 hyperlipidemia. Check lipid and hepatic panel. Continue lovastatin  #4 hypertension stable and at goal. We'll switch from nadolol to metoprolol extended release 25 mg once daily for cost issues.  #5 history of atrial fibrillation with previous ablation procedure. Currently sinus rhythm  #6 right radiculopathy symptoms currently stable. Follow-up for any persistent pain or weakness

## 2015-10-18 NOTE — Progress Notes (Signed)
Pre visit review using our clinic review tool, if applicable. No additional management support is needed unless otherwise documented below in the visit note. 

## 2015-10-18 NOTE — Patient Instructions (Signed)
Health Maintenance  Topic Date Due  . INFLUENZA VACCINE  03/25/2016  . TETANUS/TDAP  08/29/2020  . ZOSTAVAX  Completed  . PNA vac Low Risk Adult  Completed   STOP Nadolol and start Toprol XL 25 mg po one daily

## 2015-11-16 DIAGNOSIS — Z961 Presence of intraocular lens: Secondary | ICD-10-CM | POA: Diagnosis not present

## 2015-11-16 DIAGNOSIS — H40013 Open angle with borderline findings, low risk, bilateral: Secondary | ICD-10-CM | POA: Diagnosis not present

## 2015-11-16 DIAGNOSIS — H35031 Hypertensive retinopathy, right eye: Secondary | ICD-10-CM | POA: Diagnosis not present

## 2015-11-16 DIAGNOSIS — H04123 Dry eye syndrome of bilateral lacrimal glands: Secondary | ICD-10-CM | POA: Diagnosis not present

## 2015-11-16 DIAGNOSIS — H35372 Puckering of macula, left eye: Secondary | ICD-10-CM | POA: Diagnosis not present

## 2015-11-16 DIAGNOSIS — H35032 Hypertensive retinopathy, left eye: Secondary | ICD-10-CM | POA: Diagnosis not present

## 2015-11-18 ENCOUNTER — Other Ambulatory Visit: Payer: Self-pay | Admitting: Family Medicine

## 2015-11-27 ENCOUNTER — Other Ambulatory Visit: Payer: Self-pay | Admitting: Family Medicine

## 2015-12-06 DIAGNOSIS — L905 Scar conditions and fibrosis of skin: Secondary | ICD-10-CM | POA: Diagnosis not present

## 2015-12-06 DIAGNOSIS — L570 Actinic keratosis: Secondary | ICD-10-CM | POA: Diagnosis not present

## 2015-12-06 DIAGNOSIS — C44629 Squamous cell carcinoma of skin of left upper limb, including shoulder: Secondary | ICD-10-CM | POA: Diagnosis not present

## 2015-12-18 ENCOUNTER — Other Ambulatory Visit: Payer: Self-pay | Admitting: *Deleted

## 2015-12-18 DIAGNOSIS — D485 Neoplasm of uncertain behavior of skin: Secondary | ICD-10-CM | POA: Diagnosis not present

## 2015-12-18 DIAGNOSIS — C44329 Squamous cell carcinoma of skin of other parts of face: Secondary | ICD-10-CM | POA: Diagnosis not present

## 2015-12-18 MED ORDER — FUROSEMIDE 20 MG PO TABS: 20.0000 mg | ORAL_TABLET | Freq: Every day | ORAL | Status: DC

## 2015-12-18 MED ORDER — GABAPENTIN 100 MG PO CAPS: 100.0000 mg | ORAL_CAPSULE | Freq: Two times a day (BID) | ORAL | Status: DC

## 2016-01-17 DIAGNOSIS — C44329 Squamous cell carcinoma of skin of other parts of face: Secondary | ICD-10-CM | POA: Diagnosis not present

## 2016-01-17 DIAGNOSIS — D485 Neoplasm of uncertain behavior of skin: Secondary | ICD-10-CM | POA: Diagnosis not present

## 2016-01-24 ENCOUNTER — Other Ambulatory Visit: Payer: Self-pay | Admitting: Family Medicine

## 2016-03-14 ENCOUNTER — Ambulatory Visit (INDEPENDENT_AMBULATORY_CARE_PROVIDER_SITE_OTHER): Payer: Medicare Other | Admitting: Family Medicine

## 2016-03-14 ENCOUNTER — Encounter: Payer: Self-pay | Admitting: Family Medicine

## 2016-03-14 VITALS — BP 140/70 | HR 60 | Temp 98.2°F | Ht 72.5 in | Wt 184.0 lb

## 2016-03-14 DIAGNOSIS — R079 Chest pain, unspecified: Secondary | ICD-10-CM | POA: Diagnosis not present

## 2016-03-14 NOTE — Progress Notes (Signed)
Subjective:     Patient ID: [name], male   DOB: Nov 13, 1931, 82 y.o.   MRN: [id number]  HPI Patient seen following couple episodes of recent very transient chest pain. He had past history of atrial fibrillation and underwent ablation procedure several years ago. No history of known CAD. Sunday he was watching golf on television and felt some irregularity of pulse which he described as "skipped beats" He had about 1 minute episode of mid substernal chest pressure associated with some mild dyspnea. No nausea or vomiting. No dizziness. No diaphoresis. Patient then had a second episode which was very similar yesterday again at rest. He plays golf usually 2 days per week and has not had any with activity such as golfing Recalls having a stress test several years ago which was reportedly unremarkable.  He remains on amlodipine, aspirin, low-dose metoprolol, lovastatin, Lasix, and Flomax.  No chest pains today.  Past Medical History  Diagnosis Date  . CARCINOMA, SKIN, SQUAMOUS CELL 09/11/2009  . HYPERLIPIDEMIA 03/13/2009  . INSOMNIA, CHRONIC 09/11/2009  . HYPERTENSION 03/13/2009  . Atrial fibrillation ([place]) 04/07/2009  . PULMONARY NODULE 09/11/2009  . GERD 03/13/2009  . Rosacea 03/13/2009  . Actinic keratosis 03/13/2009  . SKIN CANCER, HX OF 03/13/2009  . COLONIC POLYPS, HX OF 03/13/2009  . BENIGN PROSTATIC HYPERTROPHY, HX OF 04/07/2009  . POLYPECTOMY, HX OF 04/07/2009  . Dysrhythmia     hx afib  . Arthritis    Past Surgical History  Procedure Laterality Date  . Lumbar disc surgery      RUPTURE  . Polypectomy    . Ablation  2010    x2  . Eye surgery Bilateral     cataracts  . Colonoscopy w/ biopsies and polypectomy    . Skin cancer excision    . Lumbar laminectomy/decompression microdiscectomy N/A 04/20/2014    Procedure: LUMBAR TWO-THREE, LUMBAR THREE-FOUR, LUMAR FOUR-FIVE LAMINECTOMIES;  Surgeon: Newman Pies, MD;  Location: [facility];  Service: Neurosurgery;  Laterality:  N/A;    reports that he quit smoking about 28 years ago. His smoking use included Cigarettes. He has a 30 pack-year smoking history. His smokeless tobacco use includes Chew. He reports that he does not drink alcohol or use illicit drugs. family history includes Cancer in his mother; Cancer (age of onset: 65) in his father; Heart disease in his brother and sister. Allergies  Allergen Reactions  . Amoxicillin-Pot Clavulanate Diarrhea and Nausea Only  . Losartan Other (See Comments)    Elevated creatinine levels     Review of Systems  Constitutional: Negative for chills, fatigue and unexpected weight change.  Eyes: Negative for visual disturbance.  Respiratory: Negative for cough, chest tightness and shortness of breath.   Cardiovascular: Positive for chest pain. Negative for leg swelling.  Gastrointestinal: Negative for abdominal pain.  Neurological: Negative for dizziness, syncope, weakness, light-headedness and headaches.       Objective:   Physical Exam  Constitutional: He is oriented to person, place, and time. He appears well-developed and well-nourished.  Neck: Neck supple. No thyromegaly present.  Cardiovascular: Normal rate and regular rhythm.   Pulmonary/Chest: Effort normal and breath sounds normal. No respiratory distress. He has no wheezes. He has no rales.  Musculoskeletal: He exhibits no edema.  Neurological: He is alert and oriented to person, place, and time.  Psychiatric: He has a normal mood and affect. His behavior is normal.       Assessment:     Patient seen with somewhat atypical  chest pain with 2 episodes as above each lasting about 1 minute duration.  Past hx of Atrial fibrillation currently in NSR.    Plan:     -Check EKG-shows mild bradycardia.  No acute changes. -Try to get back into see cardiology.  Referral made.   -follow up sooner for any recurrent chest pain.  [name] MD [facility] at [facility]

## 2016-03-14 NOTE — Progress Notes (Signed)
Pre visit review using our clinic review tool, if applicable. No additional management support is needed unless otherwise documented below in the visit note. 

## 2016-03-14 NOTE — Patient Instructions (Signed)
Nonspecific Chest Pain  °Chest pain can be caused by many different conditions. There is always a chance that your pain could be related to something serious, such as a heart attack or a blood clot in your lungs. Chest pain can also be caused by conditions that are not life-threatening. If you have chest pain, it is very important to follow up with your health care provider. °CAUSES  °Chest pain can be caused by: °· Heartburn. °· Pneumonia or bronchitis. °· Anxiety or stress. °· Inflammation around your heart (pericarditis) or lung (pleuritis or pleurisy). °· A blood clot in your lung. °· A collapsed lung (pneumothorax). It can develop suddenly on its own (spontaneous pneumothorax) or from trauma to the chest. °· Shingles infection (varicella-zoster virus). °· Heart attack. °· Damage to the bones, muscles, and cartilage that make up your chest wall. This can include: °¨ Bruised bones due to injury. °¨ Strained muscles or cartilage due to frequent or repeated coughing or overwork. °¨ Fracture to one or more ribs. °¨ Sore cartilage due to inflammation (costochondritis). °RISK FACTORS  °Risk factors for chest pain may include: °· Activities that increase your risk for trauma or injury to your chest. °· Respiratory infections or conditions that cause frequent coughing. °· Medical conditions or overeating that can cause heartburn. °· Heart disease or family history of heart disease. °· Conditions or health behaviors that increase your risk of developing a blood clot. °· Having had chicken pox (varicella zoster). °SIGNS AND SYMPTOMS °Chest pain can feel like: °· Burning or tingling on the surface of your chest or deep in your chest. °· Crushing, pressure, aching, or squeezing pain. °· Dull or sharp pain that is worse when you move, cough, or take a deep breath. °· Pain that is also felt in your back, neck, shoulder, or arm, or pain that spreads to any of these areas. °Your chest pain may come and go, or it may stay  constant. °DIAGNOSIS °Lab tests or other studies may be needed to find the cause of your pain. Your health care provider may have you take a test called an ambulatory ECG (electrocardiogram). An ECG records your heartbeat patterns at the time the test is performed. You may also have other tests, such as: °· Transthoracic echocardiogram (TTE). During echocardiography, sound waves are used to create a picture of all of the heart structures and to look at how blood flows through your heart. °· Transesophageal echocardiogram (TEE). This is a more advanced imaging test that obtains images from inside your body. It allows your health care provider to see your heart in finer detail. °· Cardiac monitoring. This allows your health care provider to monitor your heart rate and rhythm in real time. °· Holter monitor. This is a portable device that records your heartbeat and can help to diagnose abnormal heartbeats. It allows your health care provider to track your heart activity for several days, if needed. °· Stress tests. These can be done through exercise or by taking medicine that makes your heart beat more quickly. °· Blood tests. °· Imaging tests. °TREATMENT  °Your treatment depends on what is causing your chest pain. Treatment may include: °· Medicines. These may include: °¨ Acid blockers for heartburn. °¨ Anti-inflammatory medicine. °¨ Pain medicine for inflammatory conditions. °¨ Antibiotic medicine, if an infection is present. °¨ Medicines to dissolve blood clots. °¨ Medicines to treat coronary artery disease. °· Supportive care for conditions that do not require medicines. This may include: °¨ Resting. °¨ Applying heat   or cold packs to injured areas. °¨ Limiting activities until pain decreases. °HOME CARE INSTRUCTIONS °· If you were prescribed an antibiotic medicine, finish it all even if you start to feel better. °· Avoid any activities that bring on chest pain. °· Do not use any tobacco products, including  cigarettes, chewing tobacco, or electronic cigarettes. If you need help quitting, ask your health care provider. °· Do not drink alcohol. °· Take medicines only as directed by your health care provider. °· Keep all follow-up visits as directed by your health care provider. This is important. This includes any further testing if your chest pain does not go away. °· If heartburn is the cause for your chest pain, you may be told to keep your head raised (elevated) while sleeping. This reduces the chance that acid will go from your stomach into your esophagus. °· Make lifestyle changes as directed by your health care provider. These may include: °¨ Getting regular exercise. Ask your health care provider to suggest some activities that are safe for you. °¨ Eating a heart-healthy diet. A registered dietitian can help you to learn healthy eating options. °¨ Maintaining a healthy weight. °¨ Managing diabetes, if necessary. °¨ Reducing stress. °SEEK MEDICAL CARE IF: °· Your chest pain does not go away after treatment. °· You have a rash with blisters on your chest. °· You have a fever. °SEEK IMMEDIATE MEDICAL CARE IF:  °· Your chest pain is worse. °· You have an increasing cough, or you cough up blood. °· You have severe abdominal pain. °· You have severe weakness. °· You faint. °· You have chills. °· You have sudden, unexplained chest discomfort. °· You have sudden, unexplained discomfort in your arms, back, neck, or jaw. °· You have shortness of breath at any time. °· You suddenly start to sweat, or your skin gets clammy. °· You feel nauseous or you vomit. °· You suddenly feel light-headed or dizzy. °· Your heart begins to beat quickly, or it feels like it is skipping beats. °These symptoms may represent a serious problem that is an emergency. Do not wait to see if the symptoms will go away. Get medical help right away. Call your local emergency services (911 in the [place] Do not drive yourself to the hospital. °  °This  information is not intended to replace advice given to you by your health care provider. Make sure you discuss any questions you have with your health care provider. °  °Document Released: 05/21/2005 Document Revised: 09/01/2014 Document Reviewed: 03/17/2014 °Elsevier Interactive Patient Education ©2016 [employer] Inc[employer] ° °

## 2016-03-20 DIAGNOSIS — H11003 Unspecified pterygium of eye, bilateral: Secondary | ICD-10-CM | POA: Diagnosis not present

## 2016-03-20 DIAGNOSIS — H40013 Open angle with borderline findings, low risk, bilateral: Secondary | ICD-10-CM | POA: Diagnosis not present

## 2016-03-27 DIAGNOSIS — C44212 Basal cell carcinoma of skin of right ear and external auricular canal: Secondary | ICD-10-CM | POA: Diagnosis not present

## 2016-04-01 ENCOUNTER — Ambulatory Visit (INDEPENDENT_AMBULATORY_CARE_PROVIDER_SITE_OTHER): Payer: Medicare Other | Admitting: Cardiology

## 2016-04-01 ENCOUNTER — Encounter: Payer: Self-pay | Admitting: Cardiology

## 2016-04-01 VITALS — BP 144/80 | HR 62 | Ht 72.5 in | Wt 185.8 lb

## 2016-04-01 DIAGNOSIS — R0789 Other chest pain: Secondary | ICD-10-CM

## 2016-04-01 DIAGNOSIS — I10 Essential (primary) hypertension: Secondary | ICD-10-CM | POA: Diagnosis not present

## 2016-04-01 DIAGNOSIS — I480 Paroxysmal atrial fibrillation: Secondary | ICD-10-CM | POA: Diagnosis not present

## 2016-04-01 NOTE — Progress Notes (Signed)
Cardiology Office Note    Date:  04/01/2016   ID:  Oluwatomi, Braunstein 1932-04-02, MRN [id number]  PCP:  [name], MD  Cardiologist:  Fransico Him, MD   No chief complaint on file.   History of Present Illness:  [name] is a 82 y.o. male with a history of atrial fibrillation, HTN and hyperlipidemia who presents today for evaluation of chest pain.  He has a history of PAF and underwent afib ablation x 2 several years ago.  He has noticed some irregularity of his heart beat recently with what he describes as skipped heart beats when checking his pulse.  He has had a few episodes of transient CP lasting a minute at a time associated with SOB but no diaphoresis or nausea.  These episodes occurred at rest.  He plays golf twice weekly and it has not happened during that time.  He denies syncope.  He has chronic dizziness due to vertigo.  He has occasional LE edema when he eats too much sodium.     Past Medical History:  Diagnosis Date  . Actinic keratosis 03/13/2009  . Arthritis   . BENIGN PROSTATIC HYPERTROPHY, HX OF 04/07/2009  . CARCINOMA, SKIN, SQUAMOUS CELL 09/11/2009  . COLONIC POLYPS, HX OF 03/13/2009  . Dysrhythmia    hx afib  . GERD 03/13/2009  . HYPERLIPIDEMIA 03/13/2009  . HYPERTENSION 03/13/2009  . INSOMNIA, CHRONIC 09/11/2009  . PAF (paroxysmal atrial fibrillation) ([place]) 04/07/2009   s/p ablation x 10  . POLYPECTOMY, HX OF 04/07/2009  . PULMONARY NODULE 09/11/2009  . Rosacea 03/13/2009  . SKIN CANCER, HX OF 03/13/2009    Past Surgical History:  Procedure Laterality Date  . ABLATION  2010   x2  . COLONOSCOPY W/ BIOPSIES AND POLYPECTOMY    . EYE SURGERY Bilateral    cataracts  . LUMBAR DISC SURGERY     RUPTURE  . LUMBAR LAMINECTOMY/DECOMPRESSION MICRODISCECTOMY N/A 04/20/2014   Procedure: LUMBAR TWO-THREE, LUMBAR THREE-FOUR, LUMAR FOUR-FIVE LAMINECTOMIES;  Surgeon: Newman Pies, MD;  Location: [facility];  Service: Neurosurgery;  Laterality: N/A;  .  POLYPECTOMY    . SKIN CANCER EXCISION      Current Medications: Outpatient Medications Prior to Visit  Medication Sig Dispense Refill  . amLODipine (NORVASC) 2.5 MG tablet Take 1 tablet by mouth  daily 90 tablet 2  . aspirin EC 325 MG tablet Take 325 mg by mouth every morning.    . clobetasol ointment (TEMOVATE) [id number] % Apply 1 application topically daily as needed (psoriasis).     . furosemide (LASIX) 20 MG tablet Take 1 tablet (20 mg total) by mouth daily. 90 tablet 1  . gabapentin (NEURONTIN) 100 MG capsule Take 1 capsule (100 mg total) by mouth 2 (two) times daily. (Patient taking differently: Take 100 mg by mouth as directed. ) 180 capsule 1  . lansoprazole (PREVACID) 15 MG capsule Take 15 mg by mouth daily at 12 noon.    . lovastatin (MEVACOR) 20 MG tablet Take 1 tablet by mouth at  bedtime 90 tablet 1  . metoprolol succinate (TOPROL-XL) 25 MG 24 hr tablet Take 1 tablet (25 mg total) by mouth daily. 90 tablet 3  . metroNIDAZOLE (METROCREAM) 0.75 % cream Apply topically 3 (three)  times a week on the days he shaves 45 g 0  . tamsulosin (FLOMAX) 0.4 MG CAPS capsule Take 1 capsule by mouth  daily 90 capsule 2  . vitamin B-12 (CYANOCOBALAMIN) 500 MCG tablet Take 500  mcg by mouth daily.    . Cholecalciferol (VITAMIN D) 2000 UNITS tablet Take 2,000 Units by mouth daily.     No facility-administered medications prior to visit.      Allergies:   Amoxicillin-pot clavulanate and Losartan   Social History   Social History  . Marital status: Married    Spouse name: N/A  . Number of children: N/A  . Years of education: N/A   Social History Main Topics  . Smoking status: Former Smoker    Packs/day: 1.00    Years: 30.00    Types: Cigarettes    Quit date: 03/13/1988  . Smokeless tobacco: Current User    Types: Chew  . Alcohol use No  . Drug use: No  . Sexual activity: Not Asked   Other Topics Concern  . None   Social History Narrative  . None     Family History:  The patient's  family history includes CAD in his brother, brother, and sister; Cancer in his mother; Cancer (age of onset: 35) in his father; Heart attack in his father; Heart disease in his brother, brother, father, and sister.   ROS:   Please see the history of present illness.    ROS All other systems reviewed and are negative.   PHYSICAL EXAM:   VS:  BP (!) 144/80   Pulse 62   Ht 6' 0.5" (1.842 m)   Wt 185 lb 12.8 oz (84.3 kg)   SpO2 99%   BMI 24.85 kg/m    GEN: Well nourished, well developed, in no acute distress  HEENT: normal  Neck: no JVD, carotid bruits, or masses Cardiac: RRR; no murmurs, rubs, or gallops,no edema.  Intact distal pulses bilaterally.  Respiratory:  clear to auscultation bilaterally, normal work of breathing GI: soft, nontender, nondistended, + BS MS: no deformity or atrophy  Skin: warm and dry, no rash Neuro:  Alert and Oriented x 3, Strength and sensation are intact Psych: euthymic mood, full affect  Wt Readings from Last 3 Encounters:  04/01/16 185 lb 12.8 oz (84.3 kg)  03/14/16 184 lb (83.5 kg)  10/18/15 177 lb 11.2 oz (80.6 kg)      Studies/Labs Reviewed:   EKG:  EKG is not ordered today.   Recent Labs: 10/18/2015: ALT 11; BUN 17; Creatinine, Ser 1.27; Potassium 4.4; Sodium 140   Lipid Panel    Component Value Date/Time   CHOL 131 10/18/2015 1155   TRIG 157.0 (H) 10/18/2015 1155   HDL 31.40 (L) 10/18/2015 1155   CHOLHDL 4 10/18/2015 1155   VLDL 31.4 10/18/2015 1155   LDLCALC 68 10/18/2015 1155   LDLDIRECT 79.3 09/30/2013 1038    Additional studies/ records that were reviewed today include:  OV notes from PCP    ASSESSMENT:    1. Paroxysmal atrial fibrillation (HCC)   2. Essential hypertension   3. Other chest pain      PLAN:  In order of problems listed above:  1. PAF s/p remote ablation maintaining NSR but having some palpitations.  I will get a 30 day event monitor to assess for recurrent PAF. 2. HTN - BP controlled on current meds.  Continue amlodipine/BB 3. Chest pain somewhat atypical in that it has not occurred with exertion.  All 3 episodes of CP occurred while he was watching TV and were sharp with no associated symptoms.   He has CRFs including HTN, hyperlipidemia, family history of CAD and remote tobacco use.  I will get a stress myoview to  rule out ischemia.     Medication Adjustments/Labs and Tests Ordered: Current medicines are reviewed at length with the patient today.  Concerns regarding medicines are outlined above.  Medication changes, Labs and Tests ordered today are listed in the Patient Instructions below.  There are no Patient Instructions on file for this visit.   Signed, Fransico Him, MD  04/01/2016 11:05 AM    [address], [place], [place]  [address] Phone: [phone]; Fax: [phone]

## 2016-04-01 NOTE — Patient Instructions (Addendum)
Medication Instructions:  Your physician recommends that you continue on your current medications as directed. Please refer to the Current Medication list given to you today.   Labwork: None  Testing/Procedures: Your physician has recommended that you wear an event monitor. Event monitors are medical devices that record the heart's electrical activity. Doctors most often [place] these monitors to diagnose arrhythmias. Arrhythmias are problems with the speed or rhythm of the heartbeat. The monitor is a small, portable device. You can wear one while you do your normal daily activities. This is usually used to diagnose what is causing palpitations/syncope (passing out).  Dr. Radford Pax recommends you have a NUCLEAR STRESS TEST.  Follow-Up: Your physician wants you to follow-up in: 1 year with Dr. Radford Pax. You will receive a reminder letter in the mail two months in advance. If you don't receive a letter, please call our office to schedule the follow-up appointment.   Any Other Special Instructions Will Be Listed Below (If Applicable).     If you need a refill on your cardiac medications before your next appointment, please call your pharmacy.

## 2016-04-02 ENCOUNTER — Telehealth: Payer: Self-pay

## 2016-04-02 NOTE — Telephone Encounter (Signed)
Confirmed with patient he is to HOLD LASIX and TOPROL XL the AM of his stress test. He agreed with treatment plan.

## 2016-04-08 ENCOUNTER — Telehealth (HOSPITAL_COMMUNITY): Payer: Self-pay | Admitting: *Deleted

## 2016-04-08 NOTE — Telephone Encounter (Signed)
Patient given detailed instructions per Myocardial Perfusion Study Information Sheet for the test on 04/11/16 at 0715. Patient notified to arrive 15 minutes early and that it is imperative to arrive on time for appointment to keep from having the test rescheduled.  If you need to cancel or reschedule your appointment, please call the office within 24 hours of your appointment. Failure to do so may result in a cancellation of your appointment, and a $50 no show fee. Patient verbalized understanding.Corde Antonini, [place]

## 2016-04-10 ENCOUNTER — Encounter: Payer: Self-pay | Admitting: Cardiology

## 2016-04-11 ENCOUNTER — Ambulatory Visit (INDEPENDENT_AMBULATORY_CARE_PROVIDER_SITE_OTHER): Payer: Medicare Other

## 2016-04-11 ENCOUNTER — Ambulatory Visit (HOSPITAL_COMMUNITY): Payer: Medicare Other | Attending: Cardiology

## 2016-04-11 DIAGNOSIS — R9439 Abnormal result of other cardiovascular function study: Secondary | ICD-10-CM | POA: Diagnosis not present

## 2016-04-11 DIAGNOSIS — R0602 Shortness of breath: Secondary | ICD-10-CM | POA: Insufficient documentation

## 2016-04-11 DIAGNOSIS — R0789 Other chest pain: Secondary | ICD-10-CM | POA: Insufficient documentation

## 2016-04-11 DIAGNOSIS — I10 Essential (primary) hypertension: Secondary | ICD-10-CM | POA: Diagnosis not present

## 2016-04-11 DIAGNOSIS — I480 Paroxysmal atrial fibrillation: Secondary | ICD-10-CM | POA: Diagnosis not present

## 2016-04-11 LAB — MYOCARDIAL PERFUSION IMAGING
CHL CUP NUCLEAR SRS: 4
CHL CUP NUCLEAR SSS: 6
CHL CUP RESTING HR STRESS: 55 {beats}/min
LHR: 0.32
LV dias vol: 97 mL (ref 62–150)
LV sys vol: 36 mL
NUC STRESS TID: 0.93
Peak HR: 71 {beats}/min
SDS: 2

## 2016-04-11 MED ORDER — REGADENOSON 0.4 MG/5ML IV SOLN
0.4000 mg | Freq: Once | INTRAVENOUS | Status: AC
  Administered 2016-04-11: 0.4 mg via INTRAVENOUS

## 2016-04-11 MED ORDER — TECHNETIUM TC 99M TETROFOSMIN IV KIT
32.6000 | PACK | Freq: Once | INTRAVENOUS | Status: AC | PRN
Start: 2016-04-11 — End: 2016-04-11
  Administered 2016-04-11: 33 via INTRAVENOUS
  Filled 2016-04-11: qty 33

## 2016-04-11 MED ORDER — TECHNETIUM TC 99M TETROFOSMIN IV KIT
10.3000 | PACK | Freq: Once | INTRAVENOUS | Status: AC | PRN
  Administered 2016-04-11: 10 via INTRAVENOUS
  Filled 2016-04-11: qty 10

## 2016-04-15 DIAGNOSIS — C44212 Basal cell carcinoma of skin of right ear and external auricular canal: Secondary | ICD-10-CM | POA: Diagnosis not present

## 2016-04-17 ENCOUNTER — Ambulatory Visit: Payer: PRIVATE HEALTH INSURANCE | Admitting: Family Medicine

## 2016-04-25 ENCOUNTER — Ambulatory Visit (INDEPENDENT_AMBULATORY_CARE_PROVIDER_SITE_OTHER): Payer: Medicare Other | Admitting: Family Medicine

## 2016-04-25 ENCOUNTER — Ambulatory Visit: Payer: Medicare Other | Admitting: Family Medicine

## 2016-04-25 ENCOUNTER — Encounter: Payer: Self-pay | Admitting: Family Medicine

## 2016-04-25 VITALS — BP 130/62 | HR 68 | Temp 97.7°F | Ht 72.5 in | Wt 187.0 lb

## 2016-04-25 DIAGNOSIS — E785 Hyperlipidemia, unspecified: Secondary | ICD-10-CM | POA: Diagnosis not present

## 2016-04-25 DIAGNOSIS — R002 Palpitations: Secondary | ICD-10-CM

## 2016-04-25 DIAGNOSIS — Z23 Encounter for immunization: Secondary | ICD-10-CM

## 2016-04-25 DIAGNOSIS — I10 Essential (primary) hypertension: Secondary | ICD-10-CM | POA: Diagnosis not present

## 2016-04-25 DIAGNOSIS — I480 Paroxysmal atrial fibrillation: Secondary | ICD-10-CM | POA: Diagnosis not present

## 2016-04-25 NOTE — Progress Notes (Signed)
Subjective:     Patient ID: [name], male   DOB: [birth year], 82 y.o.   MRN: [id number]  HPI Patient here for routine medical follow-up. He has history of hypertension, atrial fibrillation, GERD, chronic insomnia, rosacea, multiple skin cancers followed closely by dermatology, stage III chronic kidney disease, BPH. Recently seen for atypical chest pain. We referred him to cardiology. Nuclear stress test low risk. He also had some intermittent palpitations. Event monitor is currently in place. He has not had any recent chest pains or exertional dyspnea. No syncope or dizziness. Compliant with medications.  He has occasional lower extremity edema and takes furosemide infrequently for that. BPH symptoms have been stable. Still needs flu vaccination.  Past Medical History:  Diagnosis Date  . Actinic keratosis 03/13/2009  . Arthritis   . BENIGN PROSTATIC HYPERTROPHY, HX OF 04/07/2009  . CARCINOMA, SKIN, SQUAMOUS CELL 09/11/2009  . COLONIC POLYPS, HX OF 03/13/2009  . Dysrhythmia    hx afib  . GERD 03/13/2009  . HYPERLIPIDEMIA 03/13/2009  . HYPERTENSION 03/13/2009  . INSOMNIA, CHRONIC 09/11/2009  . PAF (paroxysmal atrial fibrillation) ([place]) 04/07/2009   s/p ablation x 10  . POLYPECTOMY, HX OF 04/07/2009  . PULMONARY NODULE 09/11/2009  . Rosacea 03/13/2009  . SKIN CANCER, HX OF 03/13/2009   Past Surgical History:  Procedure Laterality Date  . ABLATION  2010   x2  . COLONOSCOPY W/ BIOPSIES AND POLYPECTOMY    . EYE SURGERY Bilateral    cataracts  . LUMBAR DISC SURGERY     RUPTURE  . LUMBAR LAMINECTOMY/DECOMPRESSION MICRODISCECTOMY N/A 04/20/2014   Procedure: LUMBAR TWO-THREE, LUMBAR THREE-FOUR, LUMAR FOUR-FIVE LAMINECTOMIES;  Surgeon: Newman Pies, MD;  Location: [facility];  Service: Neurosurgery;  Laterality: N/A;  . POLYPECTOMY    . SKIN CANCER EXCISION      reports that he quit smoking about 28 years ago. His smoking use included Cigarettes. He has a 30.00 pack-year smoking  history. His smokeless tobacco use includes Chew. He reports that he does not drink alcohol or use drugs. family history includes CAD in his brother, brother, and sister; Cancer in his mother; Cancer (age of onset: 51) in his father; Heart attack in his father; Heart disease in his brother, brother, father, and sister. Allergies  Allergen Reactions  . Amoxicillin-Pot Clavulanate Diarrhea and Nausea Only  . Losartan Other (See Comments)    Elevated creatinine levels     Review of Systems  Constitutional: Negative for fatigue.  Eyes: Negative for visual disturbance.  Respiratory: Negative for cough, chest tightness and shortness of breath.   Cardiovascular: Negative for chest pain, palpitations and leg swelling.  Endocrine: Negative for polydipsia and polyuria.  Neurological: Negative for dizziness, syncope, weakness, light-headedness and headaches.       Objective:   Physical Exam  Constitutional: He is oriented to person, place, and time. He appears well-developed and well-nourished.  HENT:  Right Ear: External ear normal.  Left Ear: External ear normal.  Mouth/Throat: Oropharynx is clear and moist.  Eyes: Pupils are equal, round, and reactive to light.  Neck: Neck supple. No thyromegaly present.  Cardiovascular: Normal rate and regular rhythm.   Pulmonary/Chest: Effort normal and breath sounds normal. No respiratory distress. He has no wheezes. He has no rales.  Musculoskeletal: He exhibits no edema.  Neurological: He is alert and oriented to person, place, and time.       Assessment:     #1 hypertension. Stable and at goal  #2 hyperlipidemia  #3  recent atypical chest pain/palpitaions with negative nuclear stress test. Event monitor pending  #4 past history of atrial fibrillation with previous ablation procedure    Plan:     -Continue close follow-up with cardiology -Continue current medications -Flu vaccine given -Routine follow-up 6 months and repeat lab work  then  [name] MD [facility] at [facility]

## 2016-05-03 ENCOUNTER — Other Ambulatory Visit: Payer: Self-pay | Admitting: Family Medicine

## 2016-05-20 DIAGNOSIS — L820 Inflamed seborrheic keratosis: Secondary | ICD-10-CM | POA: Diagnosis not present

## 2016-05-20 DIAGNOSIS — D0439 Carcinoma in situ of skin of other parts of face: Secondary | ICD-10-CM | POA: Diagnosis not present

## 2016-05-20 DIAGNOSIS — C44619 Basal cell carcinoma of skin of left upper limb, including shoulder: Secondary | ICD-10-CM | POA: Diagnosis not present

## 2016-05-20 DIAGNOSIS — D485 Neoplasm of uncertain behavior of skin: Secondary | ICD-10-CM | POA: Diagnosis not present

## 2016-05-20 DIAGNOSIS — C44629 Squamous cell carcinoma of skin of left upper limb, including shoulder: Secondary | ICD-10-CM | POA: Diagnosis not present

## 2016-05-20 DIAGNOSIS — L814 Other melanin hyperpigmentation: Secondary | ICD-10-CM | POA: Diagnosis not present

## 2016-05-20 DIAGNOSIS — D045 Carcinoma in situ of skin of trunk: Secondary | ICD-10-CM | POA: Diagnosis not present

## 2016-05-20 DIAGNOSIS — L570 Actinic keratosis: Secondary | ICD-10-CM | POA: Diagnosis not present

## 2016-05-20 DIAGNOSIS — Z85828 Personal history of other malignant neoplasm of skin: Secondary | ICD-10-CM | POA: Diagnosis not present

## 2016-05-20 DIAGNOSIS — L821 Other seborrheic keratosis: Secondary | ICD-10-CM | POA: Diagnosis not present

## 2016-05-20 DIAGNOSIS — C44329 Squamous cell carcinoma of skin of other parts of face: Secondary | ICD-10-CM | POA: Diagnosis not present

## 2016-05-20 DIAGNOSIS — D1801 Hemangioma of skin and subcutaneous tissue: Secondary | ICD-10-CM | POA: Diagnosis not present

## 2016-05-20 DIAGNOSIS — D0461 Carcinoma in situ of skin of right upper limb, including shoulder: Secondary | ICD-10-CM | POA: Diagnosis not present

## 2016-06-03 DIAGNOSIS — C44629 Squamous cell carcinoma of skin of left upper limb, including shoulder: Secondary | ICD-10-CM | POA: Diagnosis not present

## 2016-06-03 DIAGNOSIS — C44329 Squamous cell carcinoma of skin of other parts of face: Secondary | ICD-10-CM | POA: Diagnosis not present

## 2016-07-08 DIAGNOSIS — D0439 Carcinoma in situ of skin of other parts of face: Secondary | ICD-10-CM | POA: Diagnosis not present

## 2016-07-08 DIAGNOSIS — C44619 Basal cell carcinoma of skin of left upper limb, including shoulder: Secondary | ICD-10-CM | POA: Diagnosis not present

## 2016-07-08 DIAGNOSIS — D045 Carcinoma in situ of skin of trunk: Secondary | ICD-10-CM | POA: Diagnosis not present

## 2016-07-08 DIAGNOSIS — D0461 Carcinoma in situ of skin of right upper limb, including shoulder: Secondary | ICD-10-CM | POA: Diagnosis not present

## 2016-07-08 DIAGNOSIS — L570 Actinic keratosis: Secondary | ICD-10-CM | POA: Diagnosis not present

## 2016-07-15 ENCOUNTER — Other Ambulatory Visit: Payer: Self-pay | Admitting: Family Medicine

## 2016-07-29 DIAGNOSIS — D0461 Carcinoma in situ of skin of right upper limb, including shoulder: Secondary | ICD-10-CM | POA: Diagnosis not present

## 2016-07-29 DIAGNOSIS — D0462 Carcinoma in situ of skin of left upper limb, including shoulder: Secondary | ICD-10-CM | POA: Diagnosis not present

## 2016-09-02 ENCOUNTER — Other Ambulatory Visit: Payer: Self-pay | Admitting: Family Medicine

## 2016-10-24 ENCOUNTER — Ambulatory Visit: Payer: Medicare Other | Admitting: Family Medicine

## 2016-10-25 ENCOUNTER — Other Ambulatory Visit: Payer: Self-pay | Admitting: Family Medicine

## 2016-10-28 ENCOUNTER — Encounter: Payer: Self-pay | Admitting: Family Medicine

## 2016-10-28 ENCOUNTER — Ambulatory Visit (INDEPENDENT_AMBULATORY_CARE_PROVIDER_SITE_OTHER): Payer: Medicare Other | Admitting: Family Medicine

## 2016-10-28 VITALS — BP 130/74 | HR 65 | Temp 98.0°F | Wt 188.6 lb

## 2016-10-28 DIAGNOSIS — R7303 Prediabetes: Secondary | ICD-10-CM | POA: Diagnosis not present

## 2016-10-28 DIAGNOSIS — G4700 Insomnia, unspecified: Secondary | ICD-10-CM | POA: Diagnosis not present

## 2016-10-28 DIAGNOSIS — N183 Chronic kidney disease, stage 3 unspecified: Secondary | ICD-10-CM

## 2016-10-28 DIAGNOSIS — E785 Hyperlipidemia, unspecified: Secondary | ICD-10-CM | POA: Diagnosis not present

## 2016-10-28 DIAGNOSIS — M5431 Sciatica, right side: Secondary | ICD-10-CM

## 2016-10-28 DIAGNOSIS — I10 Essential (primary) hypertension: Secondary | ICD-10-CM

## 2016-10-28 LAB — BASIC METABOLIC PANEL
BUN: 18 mg/dL (ref 6–23)
CALCIUM: 9.2 mg/dL (ref 8.4–10.5)
CHLORIDE: 105 meq/L (ref 96–112)
CO2: 28 mEq/L (ref 19–32)
CREATININE: 1.27 mg/dL (ref 0.40–1.50)
GFR: 57.61 mL/min — ABNORMAL LOW (ref >60.00)
Glucose, Bld: 112 mg/dL — ABNORMAL HIGH (ref 70–99)
Potassium: 4 mEq/L (ref 3.5–5.1)
Sodium: 140 mEq/L (ref 135–145)

## 2016-10-28 LAB — HEPATIC FUNCTION PANEL
ALK PHOS: 81 U/L (ref 39–117)
ALT: 12 U/L (ref 0–53)
AST: 17 U/L (ref 0–37)
Albumin: 4.4 g/dL (ref 3.5–5.2)
BILIRUBIN DIRECT: 0.2 mg/dL (ref 0.0–0.3)
Total Bilirubin: 0.7 mg/dL (ref 0.2–1.2)
Total Protein: 6.6 g/dL (ref 6.0–8.3)

## 2016-10-28 LAB — LIPID PANEL
CHOL/HDL RATIO: 4
CHOLESTEROL: 128 mg/dL (ref 0–200)
HDL: 29.5 mg/dL — ABNORMAL LOW (ref >39.00)
LDL CALC: 59 mg/dL (ref 0–99)
NonHDL: 98.02
TRIGLYCERIDES: 193 mg/dL — AB (ref 0.0–149.0)
VLDL: 38.6 mg/dL (ref 0.0–40.0)

## 2016-10-28 LAB — HEMOGLOBIN A1C: Hgb A1c MFr Bld: 6 % (ref 4.6–6.5)

## 2016-10-28 MED ORDER — PREDNISONE 20 MG PO TABS: ORAL_TABLET | ORAL | 0 refills | Status: DC

## 2016-10-28 NOTE — Progress Notes (Signed)
Subjective:     Patient ID: [name], male   DOB: 18-Sep-1931, 83 y.o.   MRN: [id number]  HPI Patient seen for medical follow-up. He has multiple chronic problems including past history of atrial fibrillation with previous ablation, hypertension, prediabetes, dyslipidemia, chronic kidney disease stage III. He's had prior history of skin cancers and is followed closely by dermatology  Complains of increasing problems with insomnia. He has difficult falling asleep and staying asleep. Previously took low-dose alprazolam. Currently takes gabapentin 100 mg at night. He denies any late day use of caffeine. No regular alcohol use. Denies any daytime naps  He has a long history of sciatica and has seen neurosurgeon in the past. He has known lumbar spinal stenosis. He's had some recent progressive right lower extremity radiculopathy pains and also left upper extremity pains. Denies any recent injury. No weakness. He started taking gabapentin 100 mg each morning bit he has some sedation with that.  Past Medical History:  Diagnosis Date  . Actinic keratosis 03/13/2009  . Arthritis   . BENIGN PROSTATIC HYPERTROPHY, HX OF 04/07/2009  . CARCINOMA, SKIN, SQUAMOUS CELL 09/11/2009  . COLONIC POLYPS, HX OF 03/13/2009  . Dysrhythmia    hx afib  . GERD 03/13/2009  . HYPERLIPIDEMIA 03/13/2009  . HYPERTENSION 03/13/2009  . INSOMNIA, CHRONIC 09/11/2009  . PAF (paroxysmal atrial fibrillation) ([place]) 04/07/2009   s/p ablation x 10  . POLYPECTOMY, HX OF 04/07/2009  . PULMONARY NODULE 09/11/2009  . Rosacea 03/13/2009  . SKIN CANCER, HX OF 03/13/2009   Past Surgical History:  Procedure Laterality Date  . ABLATION  2010   x2  . COLONOSCOPY W/ BIOPSIES AND POLYPECTOMY    . EYE SURGERY Bilateral    cataracts  . LUMBAR DISC SURGERY     RUPTURE  . LUMBAR LAMINECTOMY/DECOMPRESSION MICRODISCECTOMY N/A 04/20/2014   Procedure: LUMBAR TWO-THREE, LUMBAR THREE-FOUR, LUMAR FOUR-FIVE LAMINECTOMIES;  Surgeon: Newman Pies,  MD;  Location: [facility];  Service: Neurosurgery;  Laterality: N/A;  . POLYPECTOMY    . SKIN CANCER EXCISION      reports that he quit smoking about 28 years ago. His smoking use included Cigarettes. He has a 30.00 pack-year smoking history. His smokeless tobacco use includes Chew. He reports that he does not drink alcohol or use drugs. family history includes CAD in his brother, brother, and sister; Cancer in his mother; Cancer (age of onset: 78) in his father; Heart attack in his father; Heart disease in his brother, brother, father, and sister. Allergies  Allergen Reactions  . Amoxicillin-Pot Clavulanate Diarrhea and Nausea Only  . Losartan Other (See Comments)    Elevated creatinine levels     Review of Systems  Constitutional: Negative for fatigue.  Eyes: Negative for visual disturbance.  Respiratory: Negative for cough, chest tightness and shortness of breath.   Cardiovascular: Negative for chest pain, palpitations and leg swelling.  Endocrine: Negative for polydipsia and polyuria.  Genitourinary: Negative for dysuria.  Musculoskeletal: Positive for back pain.  Neurological: Negative for dizziness, syncope, weakness, light-headedness and headaches.       Objective:   Physical Exam  Constitutional: He appears well-developed and well-nourished.  Neck: Neck supple.  Cardiovascular: Normal rate and regular rhythm.   Pulmonary/Chest: Effort normal and breath sounds normal. No respiratory distress. He has no wheezes. He has no rales.  Musculoskeletal: He exhibits no edema.  Lymphadenopathy:    He has no cervical adenopathy.  Neurological: He has normal reflexes. Coordination normal.  No focal strength deficits  Assessment:     -#1 hypertension stable and at goal  #2 past history of atrial fibrillation currently sinus rhythm clinically  #3 chronic insomnia  #4 right sided sciatica symptoms and left upper extremity radiculitis symptoms  #5 dyslipidemia     Plan:     -Check labs with basic metabolic panel, hepatic panel, lipid panel, hemoglobin A1c -Prednisone 20 mg 2 tablets daily for 6 days -Increase gabapentin to 200 mg at night 100 mg each morning -Touch base in one week if no improvement -consider referral back to neurosurgeon if no better in 2 weeks.  [name] MD [facility] at [facility]

## 2016-10-28 NOTE — Patient Instructions (Addendum)
Insomnia Insomnia is a sleep disorder that makes it difficult to fall asleep or to stay asleep. Insomnia can cause tiredness (fatigue), low energy, difficulty concentrating, mood swings, and poor performance at work or school. There are three different ways to classify insomnia:  Difficulty falling asleep.  Difficulty staying asleep.  Waking up too early in the morning. Any type of insomnia can be long-term (chronic) or short-term (acute). Both are common. Short-term insomnia usually lasts for three months or less. Chronic insomnia occurs at least three times a week for longer than three months. What are the causes? Insomnia may be caused by another condition, situation, or substance, such as:  Anxiety.  Certain medicines.  Gastroesophageal reflux disease (GERD) or other gastrointestinal conditions.  Asthma or other breathing conditions.  Restless legs syndrome, sleep apnea, or other sleep disorders.  Chronic pain.  Menopause. This may include hot flashes.  Stroke.  Abuse of alcohol, tobacco, or illegal drugs.  Depression.  Caffeine.  Neurological disorders, such as Alzheimer disease.  An overactive thyroid (hyperthyroidism). The cause of insomnia may not be known. What increases the risk? Risk factors for insomnia include:  Gender. Women are more commonly affected than men.  Age. Insomnia is more common as you get older.  Stress. This may involve your professional or personal life.  Income. Insomnia is more common in people with lower income.  Lack of exercise.  Irregular work schedule or night shifts.  Traveling between different time zones. What are the signs or symptoms? If you have insomnia, trouble falling asleep or trouble staying asleep is the main symptom. This may lead to other symptoms, such as:  Feeling fatigued.  Feeling nervous about going to sleep.  Not feeling rested in the morning.  Having trouble concentrating.  Feeling irritable,  anxious, or depressed. How is this treated? Treatment for insomnia depends on the cause. If your insomnia is caused by an underlying condition, treatment will focus on addressing the condition. Treatment may also include:  Medicines to help you sleep.  Counseling or therapy.  Lifestyle adjustments. Follow these instructions at home:  Take medicines only as directed by your health care provider.  Keep regular sleeping and waking hours. Avoid naps.  Keep a sleep diary to help you and your health care provider figure out what could be causing your insomnia. Include:  When you sleep.  When you wake up during the night.  How well you sleep.  How rested you feel the next day.  Any side effects of medicines you are taking.  What you eat and drink.  Make your bedroom a comfortable place where it is easy to fall asleep:  Put up shades or special blackout curtains to block light from outside.  Use a white noise machine to block noise.  Keep the temperature cool.  Exercise regularly as directed by your health care provider. Avoid exercising right before bedtime.  Use relaxation techniques to manage stress. Ask your health care provider to suggest some techniques that may work well for you. These may include:  Breathing exercises.  Routines to release muscle tension.  Visualizing peaceful scenes.  Cut back on alcohol, caffeinated beverages, and cigarettes, especially close to bedtime. These can disrupt your sleep.  Do not overeat or eat spicy foods right before bedtime. This can lead to digestive discomfort that can make it hard for you to sleep.  Limit screen use before bedtime. This includes:  Watching TV.  Using your smartphone, tablet, and computer.  Stick to a   routine. This can help you fall asleep faster. Try to do a quiet activity, brush your teeth, and go to bed at the same time each night.  Get out of bed if you are still awake after 15 minutes of trying to  sleep. Keep the lights down, but try reading or doing a quiet activity. When you feel sleepy, go back to bed.  Make sure that you drive carefully. Avoid driving if you feel very sleepy.  Keep all follow-up appointments as directed by your health care provider. This is important. Contact a health care provider if:  You are tired throughout the day or have trouble in your daily routine due to sleepiness.  You continue to have sleep problems or your sleep problems get worse. Get help right away if:  You have serious thoughts about hurting yourself or someone else. This information is not intended to replace advice given to you by your health care provider. Make sure you discuss any questions you have with your health care provider. Document Released: 08/08/2000 Document Revised: 01/11/2016 Document Reviewed: 05/12/2014 Elsevier Interactive Patient Education  2017 [place] ahead and increase the Gabapentin to 100 mg TWO at night and ONE in the AM

## 2016-10-28 NOTE — Progress Notes (Signed)
Pre visit review using our clinic review tool, if applicable. No additional management support is needed unless otherwise documented below in the visit note. 

## 2016-11-11 DIAGNOSIS — C44229 Squamous cell carcinoma of skin of left ear and external auricular canal: Secondary | ICD-10-CM | POA: Diagnosis not present

## 2016-11-11 DIAGNOSIS — L821 Other seborrheic keratosis: Secondary | ICD-10-CM | POA: Diagnosis not present

## 2016-11-11 DIAGNOSIS — Z85828 Personal history of other malignant neoplasm of skin: Secondary | ICD-10-CM | POA: Diagnosis not present

## 2016-11-11 DIAGNOSIS — L814 Other melanin hyperpigmentation: Secondary | ICD-10-CM | POA: Diagnosis not present

## 2016-11-11 DIAGNOSIS — L570 Actinic keratosis: Secondary | ICD-10-CM | POA: Diagnosis not present

## 2016-11-11 DIAGNOSIS — D485 Neoplasm of uncertain behavior of skin: Secondary | ICD-10-CM | POA: Diagnosis not present

## 2016-11-11 DIAGNOSIS — L718 Other rosacea: Secondary | ICD-10-CM | POA: Diagnosis not present

## 2016-11-11 DIAGNOSIS — D1801 Hemangioma of skin and subcutaneous tissue: Secondary | ICD-10-CM | POA: Diagnosis not present

## 2016-11-11 DIAGNOSIS — C44609 Unspecified malignant neoplasm of skin of left upper limb, including shoulder: Secondary | ICD-10-CM | POA: Diagnosis not present

## 2016-11-18 DIAGNOSIS — H04123 Dry eye syndrome of bilateral lacrimal glands: Secondary | ICD-10-CM | POA: Diagnosis not present

## 2016-11-18 DIAGNOSIS — Z961 Presence of intraocular lens: Secondary | ICD-10-CM | POA: Diagnosis not present

## 2016-11-18 DIAGNOSIS — H04213 Epiphora due to excess lacrimation, bilateral lacrimal glands: Secondary | ICD-10-CM | POA: Diagnosis not present

## 2016-11-18 DIAGNOSIS — H40013 Open angle with borderline findings, low risk, bilateral: Secondary | ICD-10-CM | POA: Diagnosis not present

## 2016-12-03 ENCOUNTER — Other Ambulatory Visit: Payer: Self-pay | Admitting: Family Medicine

## 2016-12-04 NOTE — Telephone Encounter (Signed)
May refill but dose should be 100 mg po qAM and 200 mg po qhs #270

## 2017-01-05 DIAGNOSIS — M25512 Pain in left shoulder: Secondary | ICD-10-CM | POA: Diagnosis not present

## 2017-01-05 DIAGNOSIS — M542 Cervicalgia: Secondary | ICD-10-CM | POA: Diagnosis not present

## 2017-01-05 DIAGNOSIS — M5412 Radiculopathy, cervical region: Secondary | ICD-10-CM | POA: Diagnosis not present

## 2017-01-05 DIAGNOSIS — M9901 Segmental and somatic dysfunction of cervical region: Secondary | ICD-10-CM | POA: Diagnosis not present

## 2017-01-05 DIAGNOSIS — M9902 Segmental and somatic dysfunction of thoracic region: Secondary | ICD-10-CM | POA: Diagnosis not present

## 2017-01-05 DIAGNOSIS — M5020 Other cervical disc displacement, unspecified cervical region: Secondary | ICD-10-CM | POA: Diagnosis not present

## 2017-01-05 DIAGNOSIS — M791 Myalgia: Secondary | ICD-10-CM | POA: Diagnosis not present

## 2017-01-06 DIAGNOSIS — C44629 Squamous cell carcinoma of skin of left upper limb, including shoulder: Secondary | ICD-10-CM | POA: Diagnosis not present

## 2017-01-07 DIAGNOSIS — M5412 Radiculopathy, cervical region: Secondary | ICD-10-CM | POA: Diagnosis not present

## 2017-01-07 DIAGNOSIS — M5020 Other cervical disc displacement, unspecified cervical region: Secondary | ICD-10-CM | POA: Diagnosis not present

## 2017-01-07 DIAGNOSIS — M9902 Segmental and somatic dysfunction of thoracic region: Secondary | ICD-10-CM | POA: Diagnosis not present

## 2017-01-07 DIAGNOSIS — M9901 Segmental and somatic dysfunction of cervical region: Secondary | ICD-10-CM | POA: Diagnosis not present

## 2017-01-07 DIAGNOSIS — M542 Cervicalgia: Secondary | ICD-10-CM | POA: Diagnosis not present

## 2017-01-07 DIAGNOSIS — M25512 Pain in left shoulder: Secondary | ICD-10-CM | POA: Diagnosis not present

## 2017-01-07 DIAGNOSIS — M791 Myalgia: Secondary | ICD-10-CM | POA: Diagnosis not present

## 2017-01-13 DIAGNOSIS — M5412 Radiculopathy, cervical region: Secondary | ICD-10-CM | POA: Diagnosis not present

## 2017-01-13 DIAGNOSIS — M9901 Segmental and somatic dysfunction of cervical region: Secondary | ICD-10-CM | POA: Diagnosis not present

## 2017-01-13 DIAGNOSIS — M791 Myalgia: Secondary | ICD-10-CM | POA: Diagnosis not present

## 2017-01-13 DIAGNOSIS — M542 Cervicalgia: Secondary | ICD-10-CM | POA: Diagnosis not present

## 2017-01-13 DIAGNOSIS — M5020 Other cervical disc displacement, unspecified cervical region: Secondary | ICD-10-CM | POA: Diagnosis not present

## 2017-01-13 DIAGNOSIS — M9902 Segmental and somatic dysfunction of thoracic region: Secondary | ICD-10-CM | POA: Diagnosis not present

## 2017-01-13 DIAGNOSIS — M25512 Pain in left shoulder: Secondary | ICD-10-CM | POA: Diagnosis not present

## 2017-01-15 DIAGNOSIS — M791 Myalgia: Secondary | ICD-10-CM | POA: Diagnosis not present

## 2017-01-15 DIAGNOSIS — M9902 Segmental and somatic dysfunction of thoracic region: Secondary | ICD-10-CM | POA: Diagnosis not present

## 2017-01-15 DIAGNOSIS — M5412 Radiculopathy, cervical region: Secondary | ICD-10-CM | POA: Diagnosis not present

## 2017-01-15 DIAGNOSIS — M25512 Pain in left shoulder: Secondary | ICD-10-CM | POA: Diagnosis not present

## 2017-01-15 DIAGNOSIS — M5020 Other cervical disc displacement, unspecified cervical region: Secondary | ICD-10-CM | POA: Diagnosis not present

## 2017-01-15 DIAGNOSIS — M9901 Segmental and somatic dysfunction of cervical region: Secondary | ICD-10-CM | POA: Diagnosis not present

## 2017-01-15 DIAGNOSIS — M542 Cervicalgia: Secondary | ICD-10-CM | POA: Diagnosis not present

## 2017-01-21 ENCOUNTER — Other Ambulatory Visit: Payer: Self-pay | Admitting: Family Medicine

## 2017-01-23 ENCOUNTER — Other Ambulatory Visit: Payer: Self-pay | Admitting: Family Medicine

## 2017-03-03 DIAGNOSIS — D0422 Carcinoma in situ of skin of left ear and external auricular canal: Secondary | ICD-10-CM | POA: Diagnosis not present

## 2017-03-14 ENCOUNTER — Other Ambulatory Visit: Payer: Self-pay | Admitting: Family Medicine

## 2017-03-16 ENCOUNTER — Encounter: Payer: Self-pay | Admitting: Family Medicine

## 2017-03-16 ENCOUNTER — Ambulatory Visit (INDEPENDENT_AMBULATORY_CARE_PROVIDER_SITE_OTHER): Payer: Medicare Other | Admitting: Family Medicine

## 2017-03-16 VITALS — BP 130/70 | HR 53 | Temp 97.9°F | Wt 192.7 lb

## 2017-03-16 DIAGNOSIS — M7542 Impingement syndrome of left shoulder: Secondary | ICD-10-CM | POA: Diagnosis not present

## 2017-03-16 MED ORDER — METHYLPREDNISOLONE ACETATE 80 MG/ML IJ SUSP
80.0000 mg | Freq: Once | INTRAMUSCULAR | Status: AC
  Administered 2017-03-16: 80 mg via INTRAMUSCULAR

## 2017-03-16 NOTE — Progress Notes (Signed)
Subjective:     Patient ID: [name], male   DOB: [birth year], 83 y.o.   MRN: [id number]  HPI Patient seen with bilateral shoulder pain left greater than right. Some low shoulder pain for several months. No injury. Occasional left neck pains but not consistently. Denies any upper extremity numbness or weakness.  He has pain mostly with abduction but also some with internal rotation left greater than right. No chest pain. No alleviating factors. Starting to have more night pain. Interfering with daily activities. No prior history of shoulder difficulties  Pain radiates from the left shoulder down toward the deltoid region.  Past Medical History:  Diagnosis Date  . Actinic keratosis 03/13/2009  . Arthritis   . BENIGN PROSTATIC HYPERTROPHY, HX OF 04/07/2009  . CARCINOMA, SKIN, SQUAMOUS CELL 09/11/2009  . COLONIC POLYPS, HX OF 03/13/2009  . Dysrhythmia    hx afib  . GERD 03/13/2009  . HYPERLIPIDEMIA 03/13/2009  . HYPERTENSION 03/13/2009  . INSOMNIA, CHRONIC 09/11/2009  . PAF (paroxysmal atrial fibrillation) ([place]) 04/07/2009   s/p ablation x 10  . POLYPECTOMY, HX OF 04/07/2009  . PULMONARY NODULE 09/11/2009  . Rosacea 03/13/2009  . SKIN CANCER, HX OF 03/13/2009   Past Surgical History:  Procedure Laterality Date  . ABLATION  2010   x2  . COLONOSCOPY W/ BIOPSIES AND POLYPECTOMY    . EYE SURGERY Bilateral    cataracts  . LUMBAR DISC SURGERY     RUPTURE  . LUMBAR LAMINECTOMY/DECOMPRESSION MICRODISCECTOMY N/A 04/20/2014   Procedure: LUMBAR TWO-THREE, LUMBAR THREE-FOUR, LUMAR FOUR-FIVE LAMINECTOMIES;  Surgeon: Newman Pies, MD;  Location: [facility];  Service: Neurosurgery;  Laterality: N/A;  . POLYPECTOMY    . SKIN CANCER EXCISION      reports that he quit smoking about 29 years ago. His smoking use included Cigarettes. He has a 30.00 pack-year smoking history. His smokeless tobacco use includes Chew. He reports that he does not drink alcohol or use drugs. family history includes CAD  in his brother, brother, and sister; Cancer in his mother; Cancer (age of onset: 45) in his father; Heart attack in his father; Heart disease in his brother, brother, father, and sister. Allergies  Allergen Reactions  . Amoxicillin-Pot Clavulanate Diarrhea and Nausea Only  . Losartan Other (See Comments)    Elevated creatinine levels     Review of Systems  Respiratory: Negative for shortness of breath.   Cardiovascular: Negative for chest pain.  Neurological: Negative for weakness and numbness.       Objective:   Physical Exam  Constitutional: He appears well-developed and well-nourished.  Cardiovascular: Normal rate and regular rhythm.   Pulmonary/Chest: Effort normal and breath sounds normal. No respiratory distress. He has no wheezes. He has no rales.  Musculoskeletal:  Shoulders are symmetric in appearance. No localized tenderness. Pain with abduction greater than about 70 on left shoulder. He has mild pain with internal rotation. No biceps tenderness. No triceps tenderness. No acromioclavicular tenderness.       Assessment:     Left shoulder pain. Suspect rotator cuff tendinitis. Probably developing early adhesive capsulitis    Plan:     - Discussed risks and benefits of corticosteroid injection and patient consented.  After prepping skin with betadine, injected 80 mg depomedrol and 2 cc of plain xylocaine with 25 gauge one and one half inch needle using posterior lateral approach and pt tolerated well. -Instructed in some gentle range of motion exercises -Try some icing 15-20 minutes 2-3 times daily neck  couple days -Follow-up in 2 weeks to reassess. Consider physical therapy for increased range of motion and strengthening  [name] MD [facility] at [facility]

## 2017-03-16 NOTE — Addendum Note (Signed)
Addended by: Westley Hummer B on: 03/16/2017 03:24 PM   Modules accepted: Orders

## 2017-03-16 NOTE — Patient Instructions (Signed)
Shoulder Impingement Syndrome Shoulder impingement syndrome is a condition that causes pain when connective tissues (tendons) surrounding the shoulder joint become pinched. These tendons are part of the group of muscles and tissues that help to stabilize the shoulder (rotator cuff). Beneath the rotator cuff is a fluid-filled sac (bursa) that allows the muscles and tendons to glide smoothly. The bursa may become swollen or irritated (bursitis). Bursitis, swelling in the rotator cuff tendons, or both conditions can decrease how much space is under a bone in the shoulder joint (acromion), resulting in impingement. What are the causes? Shoulder impingement syndrome can be caused by bursitis or swelling of the rotator cuff tendons, which may result from:  Repetitive overhead arm movements.  Falling onto the shoulder.  Weakness in the shoulder muscles.  What increases the risk? You may be more likely to develop this condition if you are an athlete who participates in:  Sports that involve throwing, such as baseball.  Tennis.  Swimming.  Volleyball.  Some people are also more likely to develop impingement syndrome because of the shape of their acromion bone. What are the signs or symptoms? The main symptom of this condition is pain on the front or side of the shoulder. Pain may:  Get worse when lifting or raising the arm.  Get worse at night.  Wake you up from sleeping.  Feel sharp when the shoulder is moved, and then fade to an ache.  Other signs and symptoms may include:  Tenderness.  Stiffness.  Inability to raise the arm above shoulder level or behind the body.  Weakness.  How is this diagnosed? This condition may be diagnosed based on:  Your symptoms.  Your medical history.  A physical exam.  Imaging tests, such as: ? X-rays. ? MRI. ? Ultrasound.  How is this treated? Treatment for this condition may include:  Resting your shoulder and avoiding all  activities that cause pain or put stress on the shoulder.  Icing your shoulder.  NSAIDs to help reduce pain and swelling.  One or more injections of medicines to numb the area and reduce inflammation.  Physical therapy.  Surgery. This may be needed if nonsurgical treatments have not helped. Surgery may involve repairing the rotator cuff, reshaping the acromion, or removing the bursa.  Follow these instructions at home: Managing pain, stiffness, and swelling  If directed, apply ice to the injured area. ? Put ice in a plastic bag. ? Place a towel between your skin and the bag. ? Leave the ice on for 20 minutes, 2-3 times a day. Activity  Rest and return to your normal activities as told by your health care provider. Ask your health care provider what activities are safe for you.  Do exercises as told by your health care provider. General instructions  Do not use any tobacco products, including cigarettes, chewing tobacco, or e-cigarettes. Tobacco can delay healing. If you need help quitting, ask your health care provider.  Ask your health care provider when it is safe for you to drive.  Take over-the-counter and prescription medicines only as told by your health care provider.  Keep all follow-up visits as told by your health care provider. This is important. How is this prevented?  Give your body time to rest between periods of activity.  Be safe and responsible while being active to avoid falls.  Maintain physical fitness, including strength and flexibility. Contact a health care provider if:  Your symptoms have not improved after 1-2 months of treatment and   rest.  You cannot lift your arm away from your body. This information is not intended to replace advice given to you by your health care provider. Make sure you discuss any questions you have with your health care provider. Document Released: 08/11/2005 Document Revised: 04/17/2016 Document Reviewed:  07/14/2015 Elsevier Interactive Patient Education  2018 [employer] Inc[employer]  

## 2017-03-24 DIAGNOSIS — D0422 Carcinoma in situ of skin of left ear and external auricular canal: Secondary | ICD-10-CM | POA: Diagnosis not present

## 2017-03-31 ENCOUNTER — Encounter: Payer: Self-pay | Admitting: Family Medicine

## 2017-03-31 ENCOUNTER — Ambulatory Visit (INDEPENDENT_AMBULATORY_CARE_PROVIDER_SITE_OTHER): Payer: Medicare Other | Admitting: Family Medicine

## 2017-03-31 VITALS — BP 130/70 | HR 67 | Temp 98.0°F | Wt 183.9 lb

## 2017-03-31 DIAGNOSIS — M25512 Pain in left shoulder: Secondary | ICD-10-CM | POA: Diagnosis not present

## 2017-03-31 NOTE — Patient Instructions (Signed)

## 2017-03-31 NOTE — Progress Notes (Signed)
Subjective:     Patient ID: [name], male   DOB: [birth year], 83 y.o.   MRN: [id number]  HPI Follow-up left shoulder pain. We did a steroid injection couple weeks ago for suspected rotator cuff tendinitis with possible early adhesive capsulitis. He states he is virtually 100% better at this time. Interestingly, his right shoulders also improved. He has full range of motion now. No night pain. No pain with abduction or internal rotation.  Past Medical History:  Diagnosis Date  . Actinic keratosis 03/13/2009  . Arthritis   . BENIGN PROSTATIC HYPERTROPHY, HX OF 04/07/2009  . CARCINOMA, SKIN, SQUAMOUS CELL 09/11/2009  . COLONIC POLYPS, HX OF 03/13/2009  . Dysrhythmia    hx afib  . GERD 03/13/2009  . HYPERLIPIDEMIA 03/13/2009  . HYPERTENSION 03/13/2009  . INSOMNIA, CHRONIC 09/11/2009  . PAF (paroxysmal atrial fibrillation) ([place]) 04/07/2009   s/p ablation x 10  . POLYPECTOMY, HX OF 04/07/2009  . PULMONARY NODULE 09/11/2009  . Rosacea 03/13/2009  . SKIN CANCER, HX OF 03/13/2009   Past Surgical History:  Procedure Laterality Date  . ABLATION  2010   x2  . COLONOSCOPY W/ BIOPSIES AND POLYPECTOMY    . EYE SURGERY Bilateral    cataracts  . LUMBAR DISC SURGERY     RUPTURE  . LUMBAR LAMINECTOMY/DECOMPRESSION MICRODISCECTOMY N/A 04/20/2014   Procedure: LUMBAR TWO-THREE, LUMBAR THREE-FOUR, LUMAR FOUR-FIVE LAMINECTOMIES;  Surgeon: Newman Pies, MD;  Location: [facility];  Service: Neurosurgery;  Laterality: N/A;  . POLYPECTOMY    . SKIN CANCER EXCISION      reports that he quit smoking about 29 years ago. His smoking use included Cigarettes. He has a 30.00 pack-year smoking history. His smokeless tobacco use includes Chew. He reports that he does not drink alcohol or use drugs. family history includes CAD in his brother, brother, and sister; Cancer in his mother; Cancer (age of onset: 79) in his father; Heart attack in his father; Heart disease in his brother, brother, father, and  sister. Allergies  Allergen Reactions  . Amoxicillin-Pot Clavulanate Diarrhea and Nausea Only  . Losartan Other (See Comments)    Elevated creatinine levels  \   Review of Systems  Respiratory: Negative for shortness of breath.   Cardiovascular: Negative for chest pain.  Neurological: Negative for weakness.       Objective:   Physical Exam  Constitutional: He appears well-developed and well-nourished.  Cardiovascular: Normal rate.   Pulmonary/Chest: Effort normal and breath sounds normal. No respiratory distress. He has no wheezes. He has no rales.  Musculoskeletal:  Full range of motion both shoulders. No localized tenderness. No pain with abduction or internal rotation       Assessment:     Rotator cuff tendinitis left shoulder improved following steroid injection    Plan:     -Patient was given Thera-Band and instructed on home exercises to isolate the rotator cuff -Follow-up for any recurrent issues  [name] MD [facility] at [facility]

## 2017-04-03 ENCOUNTER — Ambulatory Visit (INDEPENDENT_AMBULATORY_CARE_PROVIDER_SITE_OTHER): Payer: Medicare Other | Admitting: Cardiology

## 2017-04-03 ENCOUNTER — Encounter: Payer: Self-pay | Admitting: Cardiology

## 2017-04-03 VITALS — BP 124/60 | HR 67 | Ht 72.5 in | Wt 185.0 lb

## 2017-04-03 DIAGNOSIS — I481 Persistent atrial fibrillation: Secondary | ICD-10-CM | POA: Diagnosis not present

## 2017-04-03 DIAGNOSIS — I4819 Other persistent atrial fibrillation: Secondary | ICD-10-CM

## 2017-04-03 LAB — BASIC METABOLIC PANEL
BUN/Creatinine Ratio: 19 (ref 10–24)
BUN: 28 mg/dL — ABNORMAL HIGH (ref 8–27)
CHLORIDE: 101 mmol/L (ref 96–106)
CO2: 22 mmol/L (ref 20–29)
Calcium: 9.2 mg/dL (ref 8.6–10.2)
Creatinine, Ser: 1.48 mg/dL — ABNORMAL HIGH (ref 0.76–1.27)
GFR, EST AFRICAN AMERICAN: 50 mL/min/{1.73_m2} — AB (ref >59)
GFR, EST NON AFRICAN AMERICAN: 43 mL/min/{1.73_m2} — AB (ref >59)
Glucose: 136 mg/dL — ABNORMAL HIGH (ref 65–99)
POTASSIUM: 4.1 mmol/L (ref 3.5–5.2)
SODIUM: 142 mmol/L (ref 134–144)

## 2017-04-03 LAB — CBC WITH DIFFERENTIAL/PLATELET
BASOS: 0 %
Basophils Absolute: 0 10*3/uL (ref 0.0–0.2)
EOS (ABSOLUTE): 0.1 10*3/uL (ref 0.0–0.4)
Eos: 1 %
Hematocrit: 46.8 % (ref 37.5–51.0)
Hemoglobin: 15.9 g/dL (ref 13.0–17.7)
Immature Grans (Abs): 0.1 10*3/uL (ref 0.0–0.1)
Immature Granulocytes: 1 %
LYMPHS ABS: 1.3 10*3/uL (ref 0.7–3.1)
Lymphs: 18 %
MCH: 33.8 pg — AB (ref 26.6–33.0)
MCHC: 34 g/dL (ref 31.5–35.7)
MCV: 99 fL — AB (ref 79–97)
MONOS ABS: 1 10*3/uL — AB (ref 0.1–0.9)
Monocytes: 13 %
NEUTROS ABS: 4.9 10*3/uL (ref 1.4–7.0)
Neutrophils: 67 %
PLATELETS: 171 10*3/uL (ref 150–379)
RBC: 4.71 x10E6/uL (ref 4.14–5.80)
RDW: 13.6 % (ref 12.3–15.4)
WBC: 7.4 10*3/uL (ref 3.4–10.8)

## 2017-04-03 MED ORDER — ASPIRIN EC 81 MG PO TBEC: 81.0000 mg | DELAYED_RELEASE_TABLET | Freq: Every day | ORAL | 3 refills | Status: DC

## 2017-04-03 MED ORDER — APIXABAN 5 MG PO TABS: 5.0000 mg | ORAL_TABLET | Freq: Two times a day (BID) | ORAL | 11 refills | Status: DC

## 2017-04-03 NOTE — Progress Notes (Signed)
Cardiology Office Note:    Date:  04/03/2017   ID:  [name], DOB 10-Oct-1931, MRN [id number]  PCP:  [name], MD  Cardiologist:  Fransico Him, MD   Referring MD: [name], MD   Chief Complaint  Patient presents with  . Atrial Fibrillation  . Hypertension    History of Present Illness:    [name] is a 83 y.o. male with a hx of with a history of persistent atrial fibrillation s/p afib ablation at [facility] x 2 in 2010, HTN and hyperlipidemia.  He is here today for followup and is doing well.  When I last saw him he complained of very atypical chest pain and nuclear stress test was low risk with no ischemia.  He was also complaining of palpitations and event monitor showed occasional PACs and PVCs.  He is doing well today.  He denies any SOB, DOE, PND, orthopnea, palpitations, LE edema, or syncope.  He gets dizzy from taking his Gabapentin. Rarely he will get a brief pressure sensation in his chest but no different from a year ago and only occurs when sitting his is reclining chair.  His stress test was normal a year ago.    Past Medical History:  Diagnosis Date  . Actinic keratosis 03/13/2009  . Arthritis   . BENIGN PROSTATIC HYPERTROPHY, HX OF 04/07/2009  . CARCINOMA, SKIN, SQUAMOUS CELL 09/11/2009  . COLONIC POLYPS, HX OF 03/13/2009  . Dysrhythmia    hx afib  . GERD 03/13/2009  . HYPERLIPIDEMIA 03/13/2009  . HYPERTENSION 03/13/2009  . INSOMNIA, CHRONIC 09/11/2009  . PAF (paroxysmal atrial fibrillation) ([place]) 04/07/2009   s/p ablation x 10  . POLYPECTOMY, HX OF 04/07/2009  . PULMONARY NODULE 09/11/2009  . Rosacea 03/13/2009  . SKIN CANCER, HX OF 03/13/2009    Past Surgical History:  Procedure Laterality Date  . ABLATION  2010   x2  . COLONOSCOPY W/ BIOPSIES AND POLYPECTOMY    . EYE SURGERY Bilateral    cataracts  . LUMBAR DISC SURGERY     RUPTURE  . LUMBAR LAMINECTOMY/DECOMPRESSION MICRODISCECTOMY N/A 04/20/2014   Procedure: LUMBAR TWO-THREE,  LUMBAR THREE-FOUR, LUMAR FOUR-FIVE LAMINECTOMIES;  Surgeon: Newman Pies, MD;  Location: [place] [facility];  Service: Neurosurgery;  Laterality: N/A;  . POLYPECTOMY    . SKIN CANCER EXCISION      Current Medications: Current Meds  Medication Sig  . amLODipine (NORVASC) 2.5 MG tablet TAKE 1 TABLET BY MOUTH  DAILY  . clobetasol ointment (TEMOVATE) 8.52 % Apply 1 application topically daily as needed (psoriasis).   . furosemide (LASIX) 20 MG tablet Take 20 mg by mouth as directed.  . gabapentin (NEURONTIN) 100 MG capsule TAKE 1 CAPSULE BY MOUTH IN  THE MORNING AND 2 CAPS AT  BEDTIME  . lansoprazole (PREVACID) 15 MG capsule Take 15 mg by mouth daily at 12 noon.  . lovastatin (MEVACOR) 20 MG tablet TAKE 1 TABLET BY MOUTH AT  BEDTIME  . metoprolol succinate (TOPROL-XL) 25 MG 24 hr tablet TAKE 1 TABLET BY MOUTH  DAILY  . metroNIDAZOLE (METROCREAM) 0.75 % cream Apply topically 3 (three)  times a week on the days he shaves  . tamsulosin (FLOMAX) 0.4 MG CAPS capsule TAKE 1 CAPSULE BY MOUTH  DAILY  . vitamin B-12 (CYANOCOBALAMIN) 500 MCG tablet Take 500 mcg by mouth daily.  . [DISCONTINUED] aspirin EC 325 MG tablet Take 325 mg by mouth every morning.  . [DISCONTINUED] furosemide (LASIX) 20 MG tablet TAKE 1  TABLET BY MOUTH  DAILY     Allergies:   Amoxicillin-pot clavulanate and Losartan   Social History   Social History  . Marital status: Married    Spouse name: N/A  . Number of children: N/A  . Years of education: N/A   Social History Main Topics  . Smoking status: Former Smoker    Packs/day: 1.00    Years: 30.00    Types: Cigarettes    Quit date: 03/13/1988  . Smokeless tobacco: Current User    Types: Chew  . Alcohol use No  . Drug use: No  . Sexual activity: Not Asked   Other Topics Concern  . None   Social History Narrative  . None     Family History: The patient's family history includes CAD in his brother, brother, and sister; Cancer in his mother; Cancer (age of onset: 12)  in his father; Heart attack in his father; Heart disease in his brother, brother, father, and sister.  ROS:   Please see the history of present illness.     All other systems reviewed and are negative.  EKGs/Labs/Other Studies Reviewed:    The following studies were reviewed today: none  EKG:  EKG is  ordered today.  The ekg ordered today demonstrates NSR with nonspecific T wave abnormality in  AVL and unchanged from 2015  Recent Labs: 10/28/2016: ALT 12; BUN 18; Creatinine, Ser 1.27; Potassium 4.0; Sodium 140   Recent Lipid Panel    Component Value Date/Time   CHOL 128 10/28/2016 1125   TRIG 193.0 (H) 10/28/2016 1125   HDL 29.50 (L) 10/28/2016 1125   CHOLHDL 4 10/28/2016 1125   VLDL 38.6 10/28/2016 1125   LDLCALC 59 10/28/2016 1125   LDLDIRECT 79.3 09/30/2013 1038    Physical Exam:    VS:  BP 124/60   Pulse 67   Ht 6' 0.5" (1.842 m)   Wt 185 lb (83.9 kg)   BMI 24.75 kg/m     Wt Readings from Last 3 Encounters:  04/03/17 185 lb (83.9 kg)  03/31/17 183 lb 14.4 oz (83.4 kg)  03/16/17 192 lb 11.2 oz (87.4 kg)     GEN:  Well nourished, well developed in no acute distress HEENT: Normal NECK: No JVD; No carotid bruits LYMPHATICS: No lymphadenopathy CARDIAC: RRR, no murmurs, rubs, gallops RESPIRATORY:  Clear to auscultation without rales, wheezing or rhonchi  ABDOMEN: Soft, non-tender, non-distended MUSCULOSKELETAL:  No edema; No deformity  SKIN: Warm and dry NEUROLOGIC:  Alert and oriented x 3 PSYCHIATRIC:  Normal affect   ASSESSMENT:    1. Persistent atrial fibrillation (HCC)    PLAN:    In order of problems listed above:  1. Persistent atrial fibrillation s/p afib ablation.  He will continue on metoprolol.  He has not been on anticoagulation since his ablation in 2010 at [facility]  His CHADS2VASC 3 age > 56 and HTN.  He refused to take coumadin after his ablation but is willing to consider Eliquis 5mg  BID.  We discussed risks and benefits of anticoagulation and I  explained that even though he has not had any more documented afib he is still at increased risk for cardioembolic events give advanced age and HTN and recommendations are for anticoagulation. I have instructed him stop his ASA. 2. HTN - BP is well controlled on exam today. He will continue on amlodipine and metoprolol.   3. Hyperlipidemia - followup by PCP 4. PVC's - controlled on metoprolol.     Medication Adjustments/Labs  and Tests Ordered: Current medicines are reviewed at length with the patient today.  Concerns regarding medicines are outlined above.  Orders Placed This Encounter  Procedures  . Basic metabolic panel  . CBC with Differential/Platelet  . EKG 12-Lead   Meds ordered this encounter  Medications  . DISCONTD: aspirin EC 81 MG tablet    Sig: Take 1 tablet (81 mg total) by mouth daily.    Dispense:  90 tablet    Refill:  3  . apixaban (ELIQUIS) 5 MG TABS tablet    Sig: Take 1 tablet (5 mg total) by mouth 2 (two) times daily.    Dispense:  60 tablet    Refill:  11    Signed, Fransico Him, MD  04/03/2017 9:14 AM    [place]

## 2017-04-03 NOTE — Patient Instructions (Signed)
Medication Instructions:  1) STOP ASPIRIN 2) START ELIQUIS 5 mg TWICE DAILY  Labwork: TODAY: BMET, CBC  Testing/Procedures: None  Follow-Up: Your physician wants you to follow-up in: 6 months with Dr. Radford Pax. You will receive a reminder letter in the mail two months in advance. If you don't receive a letter, please call our office to schedule the follow-up appointment.   Any Other Special Instructions Will Be Listed Below (If Applicable).     If you need a refill on your cardiac medications before your next appointment, please call your pharmacy.

## 2017-04-06 ENCOUNTER — Other Ambulatory Visit: Payer: Self-pay | Admitting: *Deleted

## 2017-04-08 ENCOUNTER — Other Ambulatory Visit: Payer: Self-pay | Admitting: *Deleted

## 2017-04-08 DIAGNOSIS — I480 Paroxysmal atrial fibrillation: Secondary | ICD-10-CM

## 2017-04-16 ENCOUNTER — Other Ambulatory Visit: Payer: Medicare Other | Admitting: *Deleted

## 2017-04-16 DIAGNOSIS — I480 Paroxysmal atrial fibrillation: Secondary | ICD-10-CM

## 2017-04-16 LAB — BASIC METABOLIC PANEL
BUN/Creatinine Ratio: 13 (ref 10–24)
BUN: 15 mg/dL (ref 8–27)
CO2: 24 mmol/L (ref 20–29)
CREATININE: 1.18 mg/dL (ref 0.76–1.27)
Calcium: 9.1 mg/dL (ref 8.6–10.2)
Chloride: 100 mmol/L (ref 96–106)
GFR, EST AFRICAN AMERICAN: 66 mL/min/{1.73_m2} (ref >59)
GFR, EST NON AFRICAN AMERICAN: 57 mL/min/{1.73_m2} — AB (ref >59)
Glucose: 102 mg/dL — ABNORMAL HIGH (ref 65–99)
POTASSIUM: 4.3 mmol/L (ref 3.5–5.2)
SODIUM: 140 mmol/L (ref 134–144)

## 2017-05-14 ENCOUNTER — Other Ambulatory Visit: Payer: Self-pay | Admitting: Family Medicine

## 2017-05-15 ENCOUNTER — Other Ambulatory Visit: Payer: Self-pay | Admitting: Family Medicine

## 2017-05-19 DIAGNOSIS — L570 Actinic keratosis: Secondary | ICD-10-CM | POA: Diagnosis not present

## 2017-05-19 DIAGNOSIS — L814 Other melanin hyperpigmentation: Secondary | ICD-10-CM | POA: Diagnosis not present

## 2017-05-19 DIAGNOSIS — L821 Other seborrheic keratosis: Secondary | ICD-10-CM | POA: Diagnosis not present

## 2017-05-19 DIAGNOSIS — D485 Neoplasm of uncertain behavior of skin: Secondary | ICD-10-CM | POA: Diagnosis not present

## 2017-05-19 DIAGNOSIS — D225 Melanocytic nevi of trunk: Secondary | ICD-10-CM | POA: Diagnosis not present

## 2017-05-19 DIAGNOSIS — L400 Psoriasis vulgaris: Secondary | ICD-10-CM | POA: Diagnosis not present

## 2017-05-19 DIAGNOSIS — Z85828 Personal history of other malignant neoplasm of skin: Secondary | ICD-10-CM | POA: Diagnosis not present

## 2017-05-21 DIAGNOSIS — H04213 Epiphora due to excess lacrimation, bilateral lacrimal glands: Secondary | ICD-10-CM | POA: Diagnosis not present

## 2017-05-21 DIAGNOSIS — H04123 Dry eye syndrome of bilateral lacrimal glands: Secondary | ICD-10-CM | POA: Diagnosis not present

## 2017-05-21 DIAGNOSIS — H40013 Open angle with borderline findings, low risk, bilateral: Secondary | ICD-10-CM | POA: Diagnosis not present

## 2017-06-05 ENCOUNTER — Other Ambulatory Visit: Payer: Self-pay

## 2017-06-05 MED ORDER — APIXABAN 5 MG PO TABS: 5.0000 mg | ORAL_TABLET | Freq: Two times a day (BID) | ORAL | 2 refills | Status: DC

## 2017-06-05 NOTE — Telephone Encounter (Signed)
Eliquis 5mg  received; pt is 83 yrs old, wt-83.9kg, Crea-1.18 on 04/16/17, last seen by Dr. Radford Pax on 04/03/17, will send in refill request.

## 2017-07-07 DIAGNOSIS — C44329 Squamous cell carcinoma of skin of other parts of face: Secondary | ICD-10-CM | POA: Diagnosis not present

## 2017-07-07 DIAGNOSIS — C44622 Squamous cell carcinoma of skin of right upper limb, including shoulder: Secondary | ICD-10-CM | POA: Diagnosis not present

## 2017-07-07 DIAGNOSIS — D485 Neoplasm of uncertain behavior of skin: Secondary | ICD-10-CM | POA: Diagnosis not present

## 2017-07-07 DIAGNOSIS — L821 Other seborrheic keratosis: Secondary | ICD-10-CM | POA: Diagnosis not present

## 2017-07-07 DIAGNOSIS — L400 Psoriasis vulgaris: Secondary | ICD-10-CM | POA: Diagnosis not present

## 2017-07-07 DIAGNOSIS — L570 Actinic keratosis: Secondary | ICD-10-CM | POA: Diagnosis not present

## 2017-07-09 DIAGNOSIS — C44622 Squamous cell carcinoma of skin of right upper limb, including shoulder: Secondary | ICD-10-CM | POA: Diagnosis not present

## 2017-08-01 ENCOUNTER — Other Ambulatory Visit: Payer: Self-pay | Admitting: Family Medicine

## 2017-08-05 ENCOUNTER — Ambulatory Visit (INDEPENDENT_AMBULATORY_CARE_PROVIDER_SITE_OTHER): Payer: Medicare Other | Admitting: Family Medicine

## 2017-08-05 ENCOUNTER — Encounter: Payer: Self-pay | Admitting: Family Medicine

## 2017-08-05 VITALS — BP 140/80 | HR 69 | Temp 97.7°F | Wt 190.8 lb

## 2017-08-05 DIAGNOSIS — Z23 Encounter for immunization: Secondary | ICD-10-CM

## 2017-08-05 DIAGNOSIS — M5416 Radiculopathy, lumbar region: Secondary | ICD-10-CM | POA: Diagnosis not present

## 2017-08-05 MED ORDER — PREDNISONE 10 MG PO TABS: ORAL_TABLET | ORAL | 0 refills | Status: DC

## 2017-08-05 NOTE — Patient Instructions (Addendum)
Lumbosacral Radiculopathy Lumbosacral radiculopathy is a condition that involves the spinal nerves and nerve roots in the low back and bottom of the spine. The condition develops when these nerves and nerve roots move out of place or become inflamed and cause symptoms. What are the causes? This condition may be caused by:  Pressure from a disk that bulges out of place (herniated disk). A disk is a plate of cartilage that separates bones in the spine.  Disk degeneration.  A narrowing of the bones of the lower back (spinal stenosis).  A tumor.  An infection.  An injury that places sudden pressure on the disks that cushion the bones of your lower spine.  What increases the risk? This condition is more likely to develop in:  Males aged 30-50 years.  Females aged 50-60 years.  People who lift improperly.  People who are overweight or live a sedentary lifestyle.  People who smoke.  People who perform repetitive activities that strain the spine.  What are the signs or symptoms? Symptoms of this condition include:  Pain that goes down from the back into the legs (sciatica). This is the most common symptom. The pain may be worse with sitting, coughing, or sneezing.  Pain and numbness in the arms and legs.  Muscle weakness.  Tingling.  Loss of bladder control or bowel control.  How is this diagnosed? This condition is diagnosed with a physical exam and medical history. If the pain is lasting, you may have tests, such as:  MRI scan.  X-ray.  CT scan.  Myelogram.  Nerve conduction study.  How is this treated? This condition is often treated with:  Hot packs and ice applied to affected areas.  Stretches to improve flexibility.  Exercises to strengthen back muscles.  Physical therapy.  Pain medicine.  A steroid injection in the spine.  In some cases, no treatment is needed. If the condition is long-lasting (chronic), or if symptoms are severe, treatment may  involve surgery or lifestyle changes, such as following a weight loss plan. Follow these instructions at home: Medicines  Take medicines only as directed by your health care provider.  Do not drive or operate heavy machinery while taking pain medicine. Injury care  Apply a heat pack to the injured area as directed by your health care provider.  Apply ice to the affected area: ? Put ice in a plastic bag. ? Place a towel between your skin and the bag. ? Leave the ice on for 20-30 minutes, every 2 hours while you are awake or as needed. Or, leave the ice on for as long as directed by your health care provider. Other Instructions  If you were shown how to do any exercises or stretches, do them as directed by your health care provider.  If your health care provider prescribed a diet or exercise program, follow it as directed.  Keep all follow-up visits as directed by your health care provider. This is important. Contact a health care provider if:  Your pain does not improve over time even when taking pain medicines. Get help right away if:  Your develop severe pain.  Your pain suddenly gets worse.  You develop increasing weakness in your legs.  You lose the ability to control your bladder or bowel.  You have difficulty walking or balancing.  You have a fever. This information is not intended to replace advice given to you by your health care provider. Make sure you discuss any questions you have with your   health care provider. Document Released: 08/11/2005 Document Revised: 01/17/2016 Document Reviewed: 08/07/2014 Elsevier Interactive Patient Education  2018 Elsevier Inc.  

## 2017-08-05 NOTE — Progress Notes (Signed)
Subjective:     Patient ID: MINOR IDEN, male   DOB: Apr 26, 1932, 83 y.o.   MRN: [id number]  HPI Patient seen with progressive pain rating from the left buttock area down to the foot. He's had several weeks of achy to sharp pain but especially worse over the past 5 days or so. He's on gabapentin and increased himself to 200 mg 4-5 times daily. This helps slightly. His pain is severe at times 9 out of 10 severity. Denies any numbness or weakness. No urine or stool incontinence. He has pain both at rest and with walking. Denies any fevers or chills. He had back surgery few years ago and pain feels similar to pain he had back then.  He has been intolerant of opioids in the past secondary to side effects.  Past Medical History:  Diagnosis Date  . Actinic keratosis 03/13/2009  . Arthritis   . BENIGN PROSTATIC HYPERTROPHY, HX OF 04/07/2009  . CARCINOMA, SKIN, SQUAMOUS CELL 09/11/2009  . COLONIC POLYPS, HX OF 03/13/2009  . Dysrhythmia    hx afib  . GERD 03/13/2009  . HYPERLIPIDEMIA 03/13/2009  . HYPERTENSION 03/13/2009  . INSOMNIA, CHRONIC 09/11/2009  . PAF (paroxysmal atrial fibrillation) ([place]) 04/07/2009   s/p ablation x 10  . POLYPECTOMY, HX OF 04/07/2009  . PULMONARY NODULE 09/11/2009  . Rosacea 03/13/2009  . SKIN CANCER, HX OF 03/13/2009   Past Surgical History:  Procedure Laterality Date  . ABLATION  2010   x2  . COLONOSCOPY W/ BIOPSIES AND POLYPECTOMY    . EYE SURGERY Bilateral    cataracts  . LUMBAR DISC SURGERY     RUPTURE  . LUMBAR LAMINECTOMY/DECOMPRESSION MICRODISCECTOMY N/A 04/20/2014   Procedure: LUMBAR TWO-THREE, LUMBAR THREE-FOUR, LUMAR FOUR-FIVE LAMINECTOMIES;  Surgeon: Newman Pies, MD;  Location: [place] [facility];  Service: Neurosurgery;  Laterality: N/A;  . POLYPECTOMY    . SKIN CANCER EXCISION      reports that he quit smoking about 29 years ago. His smoking use included cigarettes. He has a 30.00 pack-year smoking history. His smokeless tobacco use includes chew. He  reports that he does not drink alcohol or use drugs. family history includes CAD in his brother, brother, and sister; Cancer in his mother; Cancer (age of onset: 74) in his father; Heart attack in his father; Heart disease in his brother, brother, father, and sister. Allergies  Allergen Reactions  . Amoxicillin-Pot Clavulanate Diarrhea and Nausea Only  . Losartan Other (See Comments)    Elevated creatinine levels     Review of Systems  Constitutional: Negative for activity change, appetite change and fever.  Respiratory: Negative for cough and shortness of breath.   Cardiovascular: Negative for chest pain and leg swelling.  Gastrointestinal: Negative for abdominal pain and vomiting.  Genitourinary: Negative for dysuria, flank pain and hematuria.  Musculoskeletal: Positive for back pain. Negative for joint swelling.  Neurological: Negative for weakness and numbness.       Objective:   Physical Exam  Constitutional: He is oriented to person, place, and time. He appears well-developed and well-nourished. No distress.  Neck: No thyromegaly present.  Cardiovascular: Normal rate, regular rhythm and normal heart sounds.  No murmur heard. Pulmonary/Chest: Effort normal and breath sounds normal. No respiratory distress. He has no wheezes. He has no rales.  Musculoskeletal: He exhibits no edema.  Neurological: He is alert and oriented to person, place, and time. He has normal reflexes. No cranial nerve deficit.  No focal weakness lower extremities. He has symmetric reflexes  ankle and knee bilaterally  Skin: No rash noted.       Assessment:     Left lumbar radiculitis symptoms.    Plan:     -We discussed starting prednisone and tapering over the next week. Reviewed potential side effects -Continue with gabapentin -Set up referral to his neurosurgeon to try to get in as soon as possible -Follow-up immediately for any progressive weakness, urine or stool incontinence, or progressive  pain -flu vaccine given.  [name] MD [facility] at [facility]

## 2017-08-27 DIAGNOSIS — D0439 Carcinoma in situ of skin of other parts of face: Secondary | ICD-10-CM | POA: Diagnosis not present

## 2017-09-18 DIAGNOSIS — I10 Essential (primary) hypertension: Secondary | ICD-10-CM | POA: Diagnosis not present

## 2017-09-18 DIAGNOSIS — M5416 Radiculopathy, lumbar region: Secondary | ICD-10-CM | POA: Diagnosis not present

## 2017-09-22 DIAGNOSIS — D0461 Carcinoma in situ of skin of right upper limb, including shoulder: Secondary | ICD-10-CM | POA: Diagnosis not present

## 2017-09-29 DIAGNOSIS — M5416 Radiculopathy, lumbar region: Secondary | ICD-10-CM | POA: Diagnosis not present

## 2017-10-01 DIAGNOSIS — M5416 Radiculopathy, lumbar region: Secondary | ICD-10-CM | POA: Diagnosis not present

## 2017-10-01 DIAGNOSIS — M48061 Spinal stenosis, lumbar region without neurogenic claudication: Secondary | ICD-10-CM | POA: Diagnosis not present

## 2017-10-14 ENCOUNTER — Other Ambulatory Visit: Payer: Self-pay | Admitting: Family Medicine

## 2017-10-14 NOTE — Telephone Encounter (Signed)
Refill both for 6 months. 

## 2017-11-03 ENCOUNTER — Ambulatory Visit (INDEPENDENT_AMBULATORY_CARE_PROVIDER_SITE_OTHER): Payer: Medicare Other | Admitting: Family Medicine

## 2017-11-03 ENCOUNTER — Encounter: Payer: Self-pay | Admitting: Family Medicine

## 2017-11-03 VITALS — BP 144/76 | HR 84 | Temp 97.9°F | Wt 183.8 lb

## 2017-11-03 DIAGNOSIS — R220 Localized swelling, mass and lump, head: Secondary | ICD-10-CM

## 2017-11-03 DIAGNOSIS — I10 Essential (primary) hypertension: Secondary | ICD-10-CM

## 2017-11-03 DIAGNOSIS — R600 Localized edema: Secondary | ICD-10-CM

## 2017-11-03 DIAGNOSIS — N183 Chronic kidney disease, stage 3 unspecified: Secondary | ICD-10-CM

## 2017-11-03 DIAGNOSIS — E785 Hyperlipidemia, unspecified: Secondary | ICD-10-CM

## 2017-11-03 LAB — TSH: TSH: 1.82 u[IU]/mL (ref 0.35–4.50)

## 2017-11-03 LAB — BASIC METABOLIC PANEL
BUN: 19 mg/dL (ref 6–23)
CHLORIDE: 102 meq/L (ref 96–112)
CO2: 30 meq/L (ref 19–32)
CREATININE: 1.41 mg/dL (ref 0.40–1.50)
Calcium: 9.7 mg/dL (ref 8.4–10.5)
GFR: 50.93 mL/min — ABNORMAL LOW (ref >60.00)
GLUCOSE: 161 mg/dL — AB (ref 70–99)
POTASSIUM: 3.7 meq/L (ref 3.5–5.1)
SODIUM: 141 meq/L (ref 135–145)

## 2017-11-03 LAB — BRAIN NATRIURETIC PEPTIDE: PRO B NATRI PEPTIDE: 22 pg/mL (ref 0.0–100.0)

## 2017-11-03 LAB — LIPID PANEL
CHOL/HDL RATIO: 4
Cholesterol: 122 mg/dL (ref 0–200)
HDL: 32.6 mg/dL — AB (ref >39.00)
LDL CALC: 56 mg/dL (ref 0–99)
NONHDL: 88.94
Triglycerides: 166 mg/dL — ABNORMAL HIGH (ref 0.0–149.0)
VLDL: 33.2 mg/dL (ref 0.0–40.0)

## 2017-11-03 LAB — HEPATIC FUNCTION PANEL
ALBUMIN: 4.3 g/dL (ref 3.5–5.2)
ALT: 13 U/L (ref 0–53)
AST: 19 U/L (ref 0–37)
Alkaline Phosphatase: 74 U/L (ref 39–117)
BILIRUBIN DIRECT: 0.2 mg/dL (ref 0.0–0.3)
TOTAL PROTEIN: 6.6 g/dL (ref 6.0–8.3)
Total Bilirubin: 0.7 mg/dL (ref 0.2–1.2)

## 2017-11-03 MED ORDER — SILDENAFIL CITRATE 100 MG PO TABS: 50.0000 mg | ORAL_TABLET | Freq: Every day | ORAL | 11 refills | Status: DC | PRN

## 2017-11-03 NOTE — Progress Notes (Signed)
Subjective:     Patient ID: [name], male   DOB: 1932/04/28, 84 y.o.   MRN: [id number]  HPI  Patient seen today with several issues  Last Thursday he noted acute swelling left lower angle of mandible. This lasted for couple days but is now back down in size. He did have some soreness. No redness. Denies any tooth pain. No sore throat. No fevers or chills. No history of acute parotitis  Bilateral foot ankle and lower leg edema intermittently for several weeks if not months. Edema slightly worse late in the day. He takes furosemide 20 mrem daily for about 3 days and then this seems to resolve his symptoms. No orthopnea. No increased dyspnea with exertion of her baseline. He does take low-dose amlodipine and gabapentin which could exacerbate. No history of known systolic failure.  Erectile dysfunction. Difficulty achieving and maintaining erection. Has never taken Viagra. No nitroglycerin use.  Hyperlipidemia on chronic statin. Needs follow-up labs. No myalgias.  Past Medical History:  Diagnosis Date  . Actinic keratosis 03/13/2009  . Arthritis   . BENIGN PROSTATIC HYPERTROPHY, HX OF 04/07/2009  . CARCINOMA, SKIN, SQUAMOUS CELL 09/11/2009  . COLONIC POLYPS, HX OF 03/13/2009  . Dysrhythmia    hx afib  . GERD 03/13/2009  . HYPERLIPIDEMIA 03/13/2009  . HYPERTENSION 03/13/2009  . INSOMNIA, CHRONIC 09/11/2009  . PAF (paroxysmal atrial fibrillation) ([place]) 04/07/2009   s/p ablation x 10  . POLYPECTOMY, HX OF 04/07/2009  . PULMONARY NODULE 09/11/2009  . Rosacea 03/13/2009  . SKIN CANCER, HX OF 03/13/2009   Past Surgical History:  Procedure Laterality Date  . ABLATION  2010   x2  . COLONOSCOPY W/ BIOPSIES AND POLYPECTOMY    . EYE SURGERY Bilateral    cataracts  . LUMBAR DISC SURGERY     RUPTURE  . LUMBAR LAMINECTOMY/DECOMPRESSION MICRODISCECTOMY N/A 04/20/2014   Procedure: LUMBAR TWO-THREE, LUMBAR THREE-FOUR, LUMAR FOUR-FIVE LAMINECTOMIES;  Surgeon: Newman Pies, MD;  Location: [place]  [facility];  Service: Neurosurgery;  Laterality: N/A;  . POLYPECTOMY    . SKIN CANCER EXCISION      reports that he quit smoking about 29 years ago. His smoking use included cigarettes. He has a 30.00 pack-year smoking history. His smokeless tobacco use includes chew. He reports that he does not drink alcohol or use drugs. family history includes CAD in his brother, brother, and sister; Cancer in his mother; Cancer (age of onset: 10) in his father; Heart attack in his father; Heart disease in his brother, brother, father, and sister. Allergies  Allergen Reactions  . Amoxicillin-Pot Clavulanate Diarrhea and Nausea Only  . Losartan Other (See Comments)    Elevated creatinine levels    Review of Systems  Constitutional: Negative for appetite change, fatigue and unexpected weight change.  Eyes: Negative for visual disturbance.  Respiratory: Negative for cough, chest tightness and shortness of breath.   Cardiovascular: Positive for leg swelling. Negative for chest pain and palpitations.  Neurological: Negative for dizziness, syncope, weakness, light-headedness and headaches.       Objective:   Physical Exam  Constitutional: He is oriented to person, place, and time. He appears well-developed and well-nourished.  HENT:  Right Ear: External ear normal.  Left Ear: External ear normal.  Mouth/Throat: Oropharynx is clear and moist. No oropharyngeal exudate.  No visible or palpable masses or edema involving the face at this time  Eyes: Pupils are equal, round, and reactive to light.  Neck: Neck supple. No thyromegaly present.  Cardiovascular: Normal  rate and regular rhythm.  Pulmonary/Chest: Effort normal and breath sounds normal. No respiratory distress. He has no wheezes. He has no rales.  Musculoskeletal:  Only very trace edema feet ankles bilaterally  Neurological: He is alert and oriented to person, place, and time.       Assessment:     #1 transient left facial swelling. Question  acute parotitis vs dental vs other.. Symptoms seem to have resolved at this time  #2 bilateral foot and ankle edema. Suspect combination of diastolic dysfunction, venous stasis, amlodipine, gabapentin  #3 erectile dysfunction  #4 hyperlipidemia. Due for follow-up labs    Plan:     -Elevate legs frequently -Discuss compression garments but he declines at this time -Check labs with basic metabolic panel, TSH, hepatic panel, lipid panel -Continue daily weights and continue furosemide 20 mg daily for 3 days prn for flareups with edema -Trial of Viagra 100 mg one half tablet daily as needed for erectile dysfunction  [name] MD [facility]

## 2017-11-03 NOTE — Patient Instructions (Signed)
Edema Edema is an abnormal buildup of fluids in your bodytissues. Edema is somewhatdependent on gravity to pull the fluid to the lowest place in your body. That makes the condition more common in the legs and thighs (lower extremities). Painless swelling of the feet and ankles is common and becomes more likely as you get older. It is also common in looser tissues, like around your eyes. When the affected area is squeezed, the fluid may move out of that spot and leave a dent for a few moments. This dent is called pitting. What are the causes? There are many possible causes of edema. Eating too much salt and being on your feet or sitting for a long time can cause edema in your legs and ankles. Hot weather may make edema worse. Common medical causes of edema include:  Heart failure.  Liver disease.  Kidney disease.  Weak blood vessels in your legs.  Cancer.  An injury.  Pregnancy.  Some medications.  Obesity.  What are the signs or symptoms? Edema is usually painless.Your skin may look swollen or shiny. How is this diagnosed? Your health care provider may be able to diagnose edema by asking about your medical history and doing a physical exam. You may need to have tests such as X-rays, an electrocardiogram, or blood tests to check for medical conditions that may cause edema. How is this treated? Edema treatment depends on the cause. If you have heart, liver, or kidney disease, you need the treatment appropriate for these conditions. General treatment may include:  Elevation of the affected body part above the level of your heart.  Compression of the affected body part. Pressure from elastic bandages or support stockings squeezes the tissues and forces fluid back into the blood vessels. This keeps fluid from entering the tissues.  Restriction of fluid and salt intake.  Use of a water pill (diuretic). These medications are appropriate only for some types of edema. They pull fluid  out of your body and make you urinate more often. This gets rid of fluid and reduces swelling, but diuretics can have side effects. Only use diuretics as directed by your health care provider.  Follow these instructions at home:  Keep the affected body part above the level of your heart when you are lying down.  Do not sit still or stand for prolonged periods.  Do not put anything directly under your knees when lying down.  Do not wear constricting clothing or garters on your upper legs.  Exercise your legs to work the fluid back into your blood vessels. This may help the swelling go down.  Wear elastic bandages or support stockings to reduce ankle swelling as directed by your health care provider.  Eat a low-salt diet to reduce fluid if your health care provider recommends it.  Only take medicines as directed by your health care provider. Contact a health care provider if:  Your edema is not responding to treatment.  You have heart, liver, or kidney disease and notice symptoms of edema.  You have edema in your legs that does not improve after elevating them.  You have sudden and unexplained weight gain. Get help right away if:  You develop shortness of breath or chest pain.  You cannot breathe when you lie down.  You develop pain, redness, or warmth in the swollen areas.  You have heart, liver, or kidney disease and suddenly get edema.  You have a fever and your symptoms suddenly get worse. This information is   not intended to replace advice given to you by your health care provider. Make sure you discuss any questions you have with your health care provider. Document Released: 08/11/2005 Document Revised: 01/17/2016 Document Reviewed: 06/03/2013 Elsevier Interactive Patient Education  2017 Elsevier Inc.  

## 2017-12-15 DIAGNOSIS — H35033 Hypertensive retinopathy, bilateral: Secondary | ICD-10-CM | POA: Diagnosis not present

## 2017-12-15 DIAGNOSIS — H04123 Dry eye syndrome of bilateral lacrimal glands: Secondary | ICD-10-CM | POA: Diagnosis not present

## 2017-12-15 DIAGNOSIS — Z961 Presence of intraocular lens: Secondary | ICD-10-CM | POA: Diagnosis not present

## 2017-12-15 DIAGNOSIS — H40013 Open angle with borderline findings, low risk, bilateral: Secondary | ICD-10-CM | POA: Diagnosis not present

## 2017-12-15 LAB — HM DIABETES EYE EXAM

## 2017-12-23 ENCOUNTER — Encounter: Payer: Self-pay | Admitting: Family Medicine

## 2017-12-24 ENCOUNTER — Other Ambulatory Visit: Payer: Self-pay | Admitting: Family Medicine

## 2017-12-31 ENCOUNTER — Other Ambulatory Visit: Payer: Self-pay | Admitting: Cardiology

## 2017-12-31 ENCOUNTER — Other Ambulatory Visit: Payer: Self-pay | Admitting: Family Medicine

## 2017-12-31 NOTE — Telephone Encounter (Signed)
Pt is a 84 yr old male that last saw Dr Radford Pax on 04/03/17. Weight on 11/03/17 was 83.4Kg at PCP appt. SCr at that visit was 1.41. Will refill Eliquis 5mg  BID.

## 2018-01-01 DIAGNOSIS — M9983 Other biomechanical lesions of lumbar region: Secondary | ICD-10-CM | POA: Diagnosis not present

## 2018-01-01 DIAGNOSIS — I10 Essential (primary) hypertension: Secondary | ICD-10-CM | POA: Diagnosis not present

## 2018-01-01 DIAGNOSIS — M545 Low back pain: Secondary | ICD-10-CM | POA: Diagnosis not present

## 2018-01-19 DIAGNOSIS — C44622 Squamous cell carcinoma of skin of right upper limb, including shoulder: Secondary | ICD-10-CM | POA: Diagnosis not present

## 2018-01-19 DIAGNOSIS — L814 Other melanin hyperpigmentation: Secondary | ICD-10-CM | POA: Diagnosis not present

## 2018-01-19 DIAGNOSIS — D225 Melanocytic nevi of trunk: Secondary | ICD-10-CM | POA: Diagnosis not present

## 2018-01-19 DIAGNOSIS — L570 Actinic keratosis: Secondary | ICD-10-CM | POA: Diagnosis not present

## 2018-01-19 DIAGNOSIS — Z85828 Personal history of other malignant neoplasm of skin: Secondary | ICD-10-CM | POA: Diagnosis not present

## 2018-01-19 DIAGNOSIS — D485 Neoplasm of uncertain behavior of skin: Secondary | ICD-10-CM | POA: Diagnosis not present

## 2018-01-19 DIAGNOSIS — D0472 Carcinoma in situ of skin of left lower limb, including hip: Secondary | ICD-10-CM | POA: Diagnosis not present

## 2018-01-19 DIAGNOSIS — L821 Other seborrheic keratosis: Secondary | ICD-10-CM | POA: Diagnosis not present

## 2018-03-16 DIAGNOSIS — C44622 Squamous cell carcinoma of skin of right upper limb, including shoulder: Secondary | ICD-10-CM | POA: Diagnosis not present

## 2018-05-31 ENCOUNTER — Other Ambulatory Visit: Payer: Self-pay | Admitting: Family Medicine

## 2018-07-20 ENCOUNTER — Ambulatory Visit (INDEPENDENT_AMBULATORY_CARE_PROVIDER_SITE_OTHER): Payer: Medicare Other | Admitting: Family Medicine

## 2018-07-20 ENCOUNTER — Encounter: Payer: Self-pay | Admitting: Family Medicine

## 2018-07-20 VITALS — BP 112/50 | HR 63 | Temp 98.1°F | Wt 188.2 lb

## 2018-07-20 DIAGNOSIS — M48061 Spinal stenosis, lumbar region without neurogenic claudication: Secondary | ICD-10-CM

## 2018-07-20 DIAGNOSIS — I4891 Unspecified atrial fibrillation: Secondary | ICD-10-CM | POA: Diagnosis not present

## 2018-07-20 DIAGNOSIS — Z23 Encounter for immunization: Secondary | ICD-10-CM

## 2018-07-20 DIAGNOSIS — G3184 Mild cognitive impairment, so stated: Secondary | ICD-10-CM

## 2018-07-20 LAB — VITAMIN B12: Vitamin B-12: 548 pg/mL (ref 211–911)

## 2018-07-20 NOTE — Patient Instructions (Signed)
Spinal Stenosis Spinal stenosis occurs when the open space (spinal canal) between the bones of your spine (vertebrae) narrows, putting pressure on the spinal cord or nerves. What are the causes? This condition is caused by areas of bone pushing into the central canals of your vertebrae. This condition may be present at birth (congenital), or it may be caused by:  Arthritic deterioration of your vertebrae (spinal degeneration). This usually starts around age 25.  Injury or trauma to the spine.  Tumors in the spine.  Calcium deposits in the spine.  What are the signs or symptoms? Symptoms of this condition include:  Pain in the neck or back that is generally worse with activities, particularly when standing and walking.  Numbness, tingling, hot or cold sensations, weakness, or weariness in your legs.  Pain going up and down the leg (sciatica).  Frequent episodes of falling.  A foot-slapping gait that leads to muscle weakness.  In more serious cases, you may develop:  Problemspassing stool or passing urine.  Difficulty having sex.  Loss of feeling in part or all of your leg.  Symptoms may come on slowly and get worse over time. How is this diagnosed? This condition is diagnosed based on your medical history and a physical exam. Tests will also be done, such as:  MRI.  CT scan.  X-ray.  How is this treated? Treatment for this condition often focuses on managing your pain and any other symptoms. Treatment may include:  Practicing good posture to lessen pressure on your nerves.  Exercising to strengthen muscles, build endurance, improve balance, and maintain good joint movement (range of motion).  Losing weight, if needed.  Taking medicines to reduce swelling, inflammation, or pain.  Assistive devices, such as a corset or brace.  In some cases, surgery may be needed. The most common procedure is decompression laminectomy. This is done to remove excess bone that  puts pressure on your nerve roots. Follow these instructions at home: Managing pain, stiffness, and swelling  Do all exercises and stretches as told by your health care provider.  Practice good posture. If you were given a brace or a corset, wear it as told by your health care provider.  Do not do any activities that cause pain. Ask your health care provider what activities are safe for you.  Do not lift anything that is heavier than 10 lb (4.5 kg) or the limit that your health care provider tells you.  Maintain a healthy weight. Talk with your health care provider if you need help losing weight.  If directed, apply heat to the affected area as often as told by your health care provider. Use the heat source that your health care provider recommends, such as a moist heat pack or a heating pad. ? Place a towel between your skin and the heat source. ? Leave the heat on for 20-30 minutes. ? Remove the heat if your skin turns bright red. This is especially important if you are not able to feel pain, heat, or cold. You may have a greater risk of getting burned. General instructions  Take over-the-counter and prescription medicines only as told by your health care provider.  Do not use any products that contain nicotine or tobacco, such as cigarettes and e-cigarettes. If you need help quitting, ask your health care provider.  Eat a healthy diet. This includes plenty of fruits and vegetables, whole grains, and low-fat (lean) protein.  Keep all follow-up visits as told by your health care provider.  This is important. Contact a health care provider if:  Your symptoms do not get better or they get worse.  You have a fever. Get help right away if:  You have new or worse pain in your neck or upper back.  You have severe pain that cannot be controlled with medicines.  You are dizzy.  You have vision problems, blurred vision, or double vision.  You have a severe headache that is worse when  you stand.  You have nausea or you vomit.  You develop new or worse numbness or tingling in your back or legs.  You have pain, redness, swelling, or warmth in your arm or leg. Summary  Spinal stenosis occurs when the open space (spinal canal) between the bones of your spine (vertebrae) narrows. This narrowing puts pressure on the spinal cord or nerves.  Spinal stenosis can cause numbness, weakness, or pain in the neck, back, and legs.  This condition may be caused by a birth defect, arthritic deterioration of your vertebrae, injury, tumors, or calcium deposits.  This condition is usually diagnosed with MRIs, CT scans, and X-rays. This information is not intended to replace advice given to you by your health care provider. Make sure you discuss any questions you have with your health care provider. Document Released: 11/01/2003 Document Revised: 07/16/2016 Document Reviewed: 07/16/2016 Elsevier Interactive Patient Education  [employer].

## 2018-07-20 NOTE — Progress Notes (Signed)
Subjective:     Patient ID: [name], male   DOB: June 25, 1932, 84 y.o.   MRN: [id number]  HPI Patient here to discuss several items today as follows  Past history of atrial fibrillation.  He had ablation procedure x2 with most recent ablation procedure several years ago- estimated 2010.  He has been to our knowledge in sinus rhythm since then.  He was placed back on Eliquis last year per cardiology but is requesting to come off at this time.  He apparently refused Coumadin therapy after his second ablation.  He has chads vas score of 3.  His main concern with Eliquis is the cost.  He has not had any recent bleeding complications.  Second issue is worsening lumbar back pain past few months.  No injury.  Prior history of surgery for lumbar stenosis.  Current pain is worse with bending and worse with standing and walking.  He has tried heat, Aleve, gabapentin without relief.  Pain is moderate. Only occasional fleeting pain left lower extremity.  Most of his pain is confined lower lumbar area.  No urine or stool incontinence.  Third issue is concern for difficulty finding words and possible short-term memory deficits.  He had thyroid function testing a few months ago normal.  Takes B12 supplement but inconsistently.  No recent focal neurologic concerns such as slurred speech or any focal weakness.  Past Medical History:  Diagnosis Date  . Actinic keratosis 03/13/2009  . Arthritis   . BENIGN PROSTATIC HYPERTROPHY, HX OF 04/07/2009  . CARCINOMA, SKIN, SQUAMOUS CELL 09/11/2009  . COLONIC POLYPS, HX OF 03/13/2009  . Dysrhythmia    hx afib  . GERD 03/13/2009  . HYPERLIPIDEMIA 03/13/2009  . HYPERTENSION 03/13/2009  . INSOMNIA, CHRONIC 09/11/2009  . PAF (paroxysmal atrial fibrillation) ([place]) 04/07/2009   s/p ablation x 10  . POLYPECTOMY, HX OF 04/07/2009  . PULMONARY NODULE 09/11/2009  . Rosacea 03/13/2009  . SKIN CANCER, HX OF 03/13/2009   Past Surgical History:  Procedure Laterality Date  .  ABLATION  2010   x2  . COLONOSCOPY W/ BIOPSIES AND POLYPECTOMY    . EYE SURGERY Bilateral    cataracts  . LUMBAR DISC SURGERY     RUPTURE  . LUMBAR LAMINECTOMY/DECOMPRESSION MICRODISCECTOMY N/A 04/20/2014   Procedure: LUMBAR TWO-THREE, LUMBAR THREE-FOUR, LUMAR FOUR-FIVE LAMINECTOMIES;  Surgeon: Newman Pies, MD;  Location: [facility];  Service: Neurosurgery;  Laterality: N/A;  . POLYPECTOMY    . SKIN CANCER EXCISION      reports that he quit smoking about 30 years ago. His smoking use included cigarettes. He has a 30.00 pack-year smoking history. His smokeless tobacco use includes chew. He reports that he does not drink alcohol or use drugs. family history includes CAD in his brother, brother, and sister; Cancer in his mother; Cancer (age of onset: 10) in his father; Heart attack in his father; Heart disease in his brother, brother, father, and sister. Allergies  Allergen Reactions  . Amoxicillin-Pot Clavulanate Diarrhea and Nausea Only  . Losartan Other (See Comments)    Elevated creatinine levels     Review of Systems  Constitutional: Negative for activity change, appetite change and fever.  Respiratory: Negative for cough and shortness of breath.   Cardiovascular: Negative for chest pain and leg swelling.  Gastrointestinal: Negative for abdominal pain and vomiting.  Genitourinary: Negative for dysuria, flank pain and hematuria.  Musculoskeletal: Positive for back pain. Negative for joint swelling and neck pain.  Neurological: Negative for weakness  and numbness.       Objective:   Physical Exam  Constitutional: He is oriented to person, place, and time. He appears well-developed and well-nourished.  Cardiovascular: Normal rate and regular rhythm.  Pulmonary/Chest: Effort normal and breath sounds normal.  Musculoskeletal: He exhibits no edema.  Straight leg raises are negative bilaterally  Neurological: He is alert and oriented to person, place, and time. No cranial nerve  deficit.  Full strength with plantarflexion, dorsiflexion, and knee extension bilaterally.  He has 1+ reflexes knee and ankle bilaterally       Assessment:     #1 past history of atrial fibrillation.  No evidence for recurrent atrial fibrillation since second ablation procedure.  He is currently on Eliquis.  Cost issues with medication as above.  #2 lumbar stenosis with recent progressive left lower lumbar pain  #3 concern for memory deficits.  MMSE 29/30    Plan:     -We will check B12 level.  Recent TSH normal -Consider repeat MMSE in about 12 months -Long discussion regarding anticoagulation.  His chads vas score would be 3.  We would not advise discontinuation unless cardiology goes along with this -We will set up referral back to his neurosurgeon regarding his progressive back issues.  ?candidate for epidural.  [name] MD [facility] at [facility]

## 2018-08-04 ENCOUNTER — Other Ambulatory Visit: Payer: Self-pay | Admitting: Cardiology

## 2018-08-04 NOTE — Telephone Encounter (Signed)
Eliquis 5mg  refill request received; pt is 84 yrs old, wt-85.4kg, Crea-1.41 on 11/03/17, last seen by Dr. Radford Pax on 04/03/17-pt was due to follow up in 6 months but hasn't. Dose is correct but pt needs a Cardiologist appt.

## 2018-08-05 NOTE — Telephone Encounter (Signed)
Spoke with pts spouse about past due appt. Pt was due to see Dr Radford Pax in 8mths after 03/2017 appt. Informed spouse that I needed to schedule a follow up as I have a refill request for his Eliquis. She states that the Eliquis is expensive as pt hit the donut hole. She states he has enough until January, thus appt scheduled on 09/07/18 and they will discuss the anticoagulant at that time.

## 2018-08-06 ENCOUNTER — Other Ambulatory Visit: Payer: Self-pay | Admitting: Family Medicine

## 2018-08-29 ENCOUNTER — Other Ambulatory Visit: Payer: Self-pay | Admitting: Family Medicine

## 2018-08-29 ENCOUNTER — Other Ambulatory Visit: Payer: Self-pay | Admitting: Cardiology

## 2018-08-31 DIAGNOSIS — D1801 Hemangioma of skin and subcutaneous tissue: Secondary | ICD-10-CM | POA: Diagnosis not present

## 2018-08-31 DIAGNOSIS — L570 Actinic keratosis: Secondary | ICD-10-CM | POA: Diagnosis not present

## 2018-08-31 DIAGNOSIS — D225 Melanocytic nevi of trunk: Secondary | ICD-10-CM | POA: Diagnosis not present

## 2018-08-31 DIAGNOSIS — D485 Neoplasm of uncertain behavior of skin: Secondary | ICD-10-CM | POA: Diagnosis not present

## 2018-08-31 DIAGNOSIS — Z85828 Personal history of other malignant neoplasm of skin: Secondary | ICD-10-CM | POA: Diagnosis not present

## 2018-08-31 DIAGNOSIS — C44329 Squamous cell carcinoma of skin of other parts of face: Secondary | ICD-10-CM | POA: Diagnosis not present

## 2018-08-31 DIAGNOSIS — L718 Other rosacea: Secondary | ICD-10-CM | POA: Diagnosis not present

## 2018-08-31 DIAGNOSIS — L820 Inflamed seborrheic keratosis: Secondary | ICD-10-CM | POA: Diagnosis not present

## 2018-08-31 DIAGNOSIS — C44622 Squamous cell carcinoma of skin of right upper limb, including shoulder: Secondary | ICD-10-CM | POA: Diagnosis not present

## 2018-09-07 ENCOUNTER — Ambulatory Visit (INDEPENDENT_AMBULATORY_CARE_PROVIDER_SITE_OTHER): Payer: Medicare Other | Admitting: Medical

## 2018-09-07 ENCOUNTER — Encounter: Payer: Self-pay | Admitting: Cardiology

## 2018-09-07 VITALS — BP 130/66 | HR 61 | Ht 74.0 in | Wt 186.4 lb

## 2018-09-07 DIAGNOSIS — I10 Essential (primary) hypertension: Secondary | ICD-10-CM

## 2018-09-07 DIAGNOSIS — Z9889 Other specified postprocedural states: Secondary | ICD-10-CM

## 2018-09-07 DIAGNOSIS — Z7901 Long term (current) use of anticoagulants: Secondary | ICD-10-CM | POA: Diagnosis not present

## 2018-09-07 DIAGNOSIS — E785 Hyperlipidemia, unspecified: Secondary | ICD-10-CM | POA: Diagnosis not present

## 2018-09-07 DIAGNOSIS — Z8679 Personal history of other diseases of the circulatory system: Secondary | ICD-10-CM | POA: Diagnosis not present

## 2018-09-07 DIAGNOSIS — I480 Paroxysmal atrial fibrillation: Secondary | ICD-10-CM | POA: Diagnosis not present

## 2018-09-07 LAB — COMPREHENSIVE METABOLIC PANEL
ALBUMIN: 4.3 g/dL (ref 3.5–4.7)
ALK PHOS: 90 IU/L (ref 39–117)
ALT: 7 IU/L (ref 0–44)
AST: 13 IU/L (ref 0–40)
Albumin/Globulin Ratio: 2 (ref 1.2–2.2)
BUN / CREAT RATIO: 10 (ref 10–24)
BUN: 14 mg/dL (ref 8–27)
Bilirubin Total: 0.4 mg/dL (ref 0.0–1.2)
CALCIUM: 9.3 mg/dL (ref 8.6–10.2)
CO2: 22 mmol/L (ref 20–29)
CREATININE: 1.4 mg/dL — AB (ref 0.76–1.27)
Chloride: 104 mmol/L (ref 96–106)
GFR, EST AFRICAN AMERICAN: 53 mL/min/{1.73_m2} — AB (ref >59)
GFR, EST NON AFRICAN AMERICAN: 46 mL/min/{1.73_m2} — AB (ref >59)
GLOBULIN, TOTAL: 2.1 g/dL (ref 1.5–4.5)
Glucose: 102 mg/dL — ABNORMAL HIGH (ref 65–99)
Potassium: 4.1 mmol/L (ref 3.5–5.2)
SODIUM: 141 mmol/L (ref 134–144)
TOTAL PROTEIN: 6.4 g/dL (ref 6.0–8.5)

## 2018-09-07 MED ORDER — APIXABAN 5 MG PO TABS: 5.0000 mg | ORAL_TABLET | Freq: Two times a day (BID) | ORAL | 3 refills | Status: DC

## 2018-09-07 NOTE — Patient Instructions (Signed)
Medication Instructions:  none If you need a refill on your cardiac medications before your next appointment, please call your pharmacy.   Lab work:TODAY CMP If you have labs (blood work) drawn today and your tests are completely normal, you will receive your results only by: Marland Kitchen MyChart Message (if you have MyChart) OR . A paper copy in the mail If you have any lab test that is abnormal or we need to change your treatment, we will call you to review the results.  Testing/Procedures: NONE  Follow-Up: At [facility], you and your health needs are our priority.  As part of our continuing mission to provide you with exceptional heart care, we have created designated Provider Care Teams.  These Care Teams include your primary Cardiologist (physician) and Advanced Practice Providers (APPs -  Physician Assistants and Nurse Practitioners) who all work together to provide you with the care you need, when you need it. You will need a follow up appointment in 6 months.  Please call our office 2 months in advance to schedule this appointment.  You may see Dr Radford Pax or one of the following Advanced Practice Providers on your designated Care Team:   Lyda Jester, PA-C Melina Copa, PA-C . Ermalinda Barrios, PA-C  Any Other Special Instructions Will Be Listed Below (If Applicable).

## 2018-09-07 NOTE — Progress Notes (Signed)
Cardiology Office Note   Date:  09/07/2018   ID:  [name], [name] [birth year], MRN [id number]  PCP:  Steven Post, MD  Cardiologist:  No primary care provider on file. EP: None  Chief Complaint  Patient presents with  . Follow-up    atrial fibrillation and HTN      History of Present Illness: [name] is a 85 y.o. male with PMH of persistent atrial fibrillation s/p ablation in 2010 without recurrence, HTN, and HLD, who presents for routine follow-up.  He was last seen by cardiology, Dr. Radford Holder, 03/2017, at which time he was doing well from a cardiac standpoint. He reported rare chest pressure complaints similar to those 1 year prior which were evaluated with a NST 03/2016 and found to have no ischemia. He also had an event monitor placed 03/2016 for the evaluation of palpitations which revealed PACs/PVCs and no recurrent atrial fibrillation. He had previously refused coumadin in the past but was agreeable to eliquis at his last visit with Dr. Radford Holder.   He returns today for routine follow-up. He reports doing well from a cardiac standpoint since he was last seen by Dr. Radford Holder. Only one episode of chest discomfort which he states was related to acid reflux and relieved with tums. He has occasional ankle edema which improves with prn lasix. No complaints of exertional chest pain, SOB, DOE, dizziness, lightheadedness, syncope, hematuria, melena, or hematochezia.    Past Medical History:  Diagnosis Date  . Actinic keratosis 03/13/2009  . Arthritis   . BENIGN PROSTATIC HYPERTROPHY, HX OF 04/07/2009  . CARCINOMA, SKIN, SQUAMOUS CELL 09/11/2009  . COLONIC POLYPS, HX OF 03/13/2009  . Dysrhythmia    hx afib  . GERD 03/13/2009  . HYPERLIPIDEMIA 03/13/2009  . HYPERTENSION 03/13/2009  . INSOMNIA, CHRONIC 09/11/2009  . PAF (paroxysmal atrial fibrillation) ([place]) 04/07/2009   s/p ablation x 10  . POLYPECTOMY, HX OF 04/07/2009  . PULMONARY NODULE 09/11/2009  . Rosacea 03/13/2009  .  SKIN CANCER, HX OF 03/13/2009    Past Surgical History:  Procedure Laterality Date  . ABLATION  2010   x2  . COLONOSCOPY W/ BIOPSIES AND POLYPECTOMY    . EYE SURGERY Bilateral    cataracts  . LUMBAR DISC SURGERY     RUPTURE  . LUMBAR LAMINECTOMY/DECOMPRESSION MICRODISCECTOMY N/A 04/20/2014   Procedure: LUMBAR TWO-THREE, LUMBAR THREE-FOUR, LUMAR FOUR-FIVE LAMINECTOMIES;  Surgeon: Steven Pies, MD;  Location: [facility];  Service: Neurosurgery;  Laterality: N/A;  . POLYPECTOMY    . SKIN CANCER EXCISION       Current Outpatient Medications  Medication Sig Dispense Refill  . amLODipine (NORVASC) 2.5 MG tablet TAKE 1 TABLET BY MOUTH  DAILY 90 tablet 1  . apixaban (ELIQUIS) 5 MG TABS tablet Take 1 tablet (5 mg total) by mouth 2 (two) times daily. 180 tablet 3  . furosemide (LASIX) 20 MG tablet TAKE 1 TABLET BY MOUTH  DAILY 90 tablet 1  . gabapentin (NEURONTIN) 100 MG capsule TAKE 1 CAPSULE BY MOUTH IN  THE MORNING AND 2 CAPSULES  AT BEDTIME 270 capsule 1  . lansoprazole (PREVACID) 15 MG capsule Take 15 mg by mouth daily at 12 noon.    . lovastatin (MEVACOR) 20 MG tablet TAKE 1 TABLET BY MOUTH AT  BEDTIME 90 tablet 0  . metoprolol succinate (TOPROL-XL) 25 MG 24 hr tablet TAKE 1 TABLET BY MOUTH  DAILY 90 tablet 2  . metroNIDAZOLE (METROCREAM) 0.75 % cream APPLY TOPICALLY 3  TIMES A  WEEK ON THE DAYS HE SHAVES 45 g 1  . tamsulosin (FLOMAX) 0.4 MG CAPS capsule TAKE 1 CAPSULE BY MOUTH  DAILY 90 capsule 1  . vitamin B-12 (CYANOCOBALAMIN) 500 MCG tablet Take 500 mcg by mouth daily.     No current facility-administered medications for this visit.     Allergies:   Amoxicillin-pot clavulanate and Losartan    Social History:  The patient  reports that he quit smoking about 30 years ago. His smoking use included cigarettes. He has a 30.00 pack-year smoking history. His smokeless tobacco use includes chew. He reports that he does not drink alcohol or use drugs.   Family History:  The patient's  family history includes CAD in his brother, brother, and sister; Cancer in his mother; Cancer (age of onset: 70) in his father; Heart attack in his father; Heart disease in his brother, brother, father, and sister.    ROS:  Please see the history of present illness.   Otherwise, review of systems are positive for none.   All other systems are reviewed and negative.    PHYSICAL EXAM: VS:  BP 130/66   Pulse 61   Ht '6\' 2"'$  (1.88 m)   Wt 186 lb 6.4 oz (84.6 kg)   SpO2 96%   BMI 23.93 kg/m  , BMI Body mass index is 23.93 kg/m. GEN: Well nourished, well developed, in no acute distress HEENT: sclera anicteric Neck: no JVD, carotid bruits, or masses Cardiac: RRR; no murmurs, rubs, or gallops, no edema  Respiratory:  clear to auscultation bilaterally, normal work of breathing GI: soft, nontender, nondistended, + BS MS: no deformity or atrophy Skin: warm and dry, no rash Neuro:  Strength and sensation are intact Psych: euthymic mood, full affect   EKG:  EKG is ordered today. The ekg ordered today demonstrates NSR, rate 67, no STE/D, no TWI.   Recent Labs: 11/03/2017: ALT 13; BUN 19; Creatinine, Ser 1.41; Potassium 3.7; Pro B Natriuretic peptide (BNP) 22.0; Sodium 141; TSH 1.82    Lipid Panel    Component Value Date/Time   CHOL 122 11/03/2017 1018   TRIG 166.0 (H) 11/03/2017 1018   HDL 32.60 (L) 11/03/2017 1018   CHOLHDL 4 11/03/2017 1018   VLDL 33.2 11/03/2017 1018   LDLCALC 56 11/03/2017 1018   LDLDIRECT 79.3 09/30/2013 1038      Wt Readings from Last 3 Encounters:  09/07/18 186 lb 6.4 oz (84.6 kg)  07/20/18 188 lb 3.2 oz (85.4 kg)  11/03/17 183 lb 12.8 oz (83.4 kg)      Other studies Reviewed: Additional studies/ records that were reviewed today include:   NST 03/2016:  Nuclear stress EF: 62%.  Defect 1: There is a small defect of mild severity present in the apex location.  This is a low risk study.  There was no ST segment deviation noted during stress.     Low risk stress nuclear study with an apical thinning artifact, otherwise normal perfusion and normal left ventricular regional and global systolic function.   ASSESSMENT AND PLAN:  1. Atrial fibrillation s/p ablation: no recurrent arrhythmias observed. He was started on eliquis '5mg'$  BID 03/2018 for CHA2DS2  Vasc score of 3 (HTN and age >81). He reported increased eliquis cost at the end of last year due to being in the donut hole and inquired about alternative therapies. We discussed that aspirin is not sufficient anticoagulation for preventing strokes. Spoke to his pharmacy - Eliquis and Xarelto are both $47/mo.  at this time. Patient is not interested in coumadin.  - He is agreeable to continuing eliquis at this time.  - Will recheck BMET today - if Cr >1.5, patient would benefit from lower dose eliquis to minimize bleeding risk. This may help with cost if lower dose recommended.   2. HTN: BP stable today - Continue amlodipine, metoprolol succinate, and prn lasix  3. HLD: LDL 56 on lipids 10/2017.  - Continue lovastatin   Current medicines are reviewed at length with the patient today.  The patient does not have concerns regarding medicines.  The following changes have been made:  no change  Labs/ tests ordered today include:   Orders Placed This Encounter  Procedures  . Comp Met (CMET)  . EKG 12-Lead     Disposition:   FU with Dr. Radford Holder in 6 months  Signed, Abigail Butts, PA-C  09/07/2018 10:28 AM

## 2018-10-11 DIAGNOSIS — C44622 Squamous cell carcinoma of skin of right upper limb, including shoulder: Secondary | ICD-10-CM | POA: Diagnosis not present

## 2018-10-12 DIAGNOSIS — C44622 Squamous cell carcinoma of skin of right upper limb, including shoulder: Secondary | ICD-10-CM | POA: Diagnosis not present

## 2018-10-14 DIAGNOSIS — C44622 Squamous cell carcinoma of skin of right upper limb, including shoulder: Secondary | ICD-10-CM | POA: Diagnosis not present

## 2018-10-14 DIAGNOSIS — C4492 Squamous cell carcinoma of skin, unspecified: Secondary | ICD-10-CM | POA: Diagnosis not present

## 2018-10-14 DIAGNOSIS — M48061 Spinal stenosis, lumbar region without neurogenic claudication: Secondary | ICD-10-CM | POA: Diagnosis not present

## 2018-10-14 DIAGNOSIS — I10 Essential (primary) hypertension: Secondary | ICD-10-CM | POA: Diagnosis not present

## 2018-10-14 DIAGNOSIS — Z483 Aftercare following surgery for neoplasm: Secondary | ICD-10-CM | POA: Diagnosis not present

## 2018-10-14 DIAGNOSIS — Z79899 Other long term (current) drug therapy: Secondary | ICD-10-CM | POA: Diagnosis not present

## 2018-10-14 DIAGNOSIS — I4819 Other persistent atrial fibrillation: Secondary | ICD-10-CM | POA: Diagnosis not present

## 2018-10-14 DIAGNOSIS — Z7901 Long term (current) use of anticoagulants: Secondary | ICD-10-CM | POA: Diagnosis not present

## 2018-10-14 DIAGNOSIS — E785 Hyperlipidemia, unspecified: Secondary | ICD-10-CM | POA: Diagnosis not present

## 2018-10-30 ENCOUNTER — Other Ambulatory Visit: Payer: Self-pay | Admitting: Family Medicine

## 2018-11-30 ENCOUNTER — Encounter: Payer: Self-pay | Admitting: Family Medicine

## 2018-12-01 ENCOUNTER — Other Ambulatory Visit: Payer: Self-pay

## 2018-12-01 DIAGNOSIS — C44329 Squamous cell carcinoma of skin of other parts of face: Secondary | ICD-10-CM | POA: Diagnosis not present

## 2018-12-01 MED ORDER — FUROSEMIDE 20 MG PO TABS: 20.0000 mg | ORAL_TABLET | Freq: Every day | ORAL | 1 refills | Status: DC

## 2019-01-05 DIAGNOSIS — C44622 Squamous cell carcinoma of skin of right upper limb, including shoulder: Secondary | ICD-10-CM | POA: Diagnosis not present

## 2019-01-05 DIAGNOSIS — C44629 Squamous cell carcinoma of skin of left upper limb, including shoulder: Secondary | ICD-10-CM | POA: Diagnosis not present

## 2019-01-05 DIAGNOSIS — D485 Neoplasm of uncertain behavior of skin: Secondary | ICD-10-CM | POA: Diagnosis not present

## 2019-01-12 ENCOUNTER — Other Ambulatory Visit: Payer: Self-pay | Admitting: Family Medicine

## 2019-01-25 ENCOUNTER — Encounter: Payer: Self-pay | Admitting: Family Medicine

## 2019-01-25 DIAGNOSIS — C44629 Squamous cell carcinoma of skin of left upper limb, including shoulder: Secondary | ICD-10-CM | POA: Diagnosis not present

## 2019-01-25 DIAGNOSIS — L905 Scar conditions and fibrosis of skin: Secondary | ICD-10-CM | POA: Diagnosis not present

## 2019-03-11 ENCOUNTER — Other Ambulatory Visit: Payer: Self-pay | Admitting: Family Medicine

## 2019-03-14 ENCOUNTER — Other Ambulatory Visit: Payer: Self-pay

## 2019-03-14 ENCOUNTER — Ambulatory Visit (INDEPENDENT_AMBULATORY_CARE_PROVIDER_SITE_OTHER): Payer: Medicare Other | Admitting: Family Medicine

## 2019-03-14 DIAGNOSIS — N183 Chronic kidney disease, stage 3 unspecified: Secondary | ICD-10-CM

## 2019-03-14 DIAGNOSIS — R197 Diarrhea, unspecified: Secondary | ICD-10-CM

## 2019-03-14 DIAGNOSIS — I10 Essential (primary) hypertension: Secondary | ICD-10-CM | POA: Diagnosis not present

## 2019-03-14 DIAGNOSIS — E785 Hyperlipidemia, unspecified: Secondary | ICD-10-CM

## 2019-03-14 NOTE — Progress Notes (Signed)
Patient ID: [name], male   DOB: 1932/04/21, 85 y.o.   MRN: [id number]  This visit type was conducted due to national recommendations for restrictions regarding the COVID-19 pandemic in an effort to limit this patient's exposure and mitigate transmission in our community.   Virtual Visit via Telephone Note  I connected with [name] on 03/14/19 at  2:30 PM EDT by telephone and verified that I am speaking with the correct person using two identifiers.   I discussed the limitations, risks, security and privacy concerns of performing an evaluation and management service by telephone and the availability of in person appointments. I also discussed with the patient that there may be a patient responsible charge related to this service. The patient expressed understanding and agreed to proceed.  Location patient: home Location provider: work or home office Participants present for the call: patient, provider Patient did not have a visit in the prior 7 days to address this/these issue(s).   History of Present Illness:  Patient has multiple chronic problems including hypertension,, history of atrial fibrillation, GERD, multiple skin cancers, chronic kidney disease, BPH, hyperlipidemia, and lumbar stenosis.  He states that he has had some slight decreased appetite past few weeks though his weight is actually up slightly.  He states he is 191 pounds per his scales.  His main issue is he had some increased gas symptoms and frequent loose stools sometimes 2 to 3/day most days now for estimated 3 weeks.  No bloody stools.  Only rare watery stools.  Denies any abdominal pain.  No blood in the stools.  Occasional leakage of stool.  He has had some difficulties urinating and emptying bladder and sometimes notices stool leakage when trying to urinate.  Last colonoscopy 2012 which showed diverticulosis changes.  Hyperlipidemia treated with Mevacor.  No recent myalgias.  He is on several other  medications which are reviewed.  His blood pressures been stable  Past Medical History:  Diagnosis Date  . Actinic keratosis 03/13/2009  . Arthritis   . BENIGN PROSTATIC HYPERTROPHY, HX OF 04/07/2009  . CARCINOMA, SKIN, SQUAMOUS CELL 09/11/2009  . COLONIC POLYPS, HX OF 03/13/2009  . Dysrhythmia    hx afib  . GERD 03/13/2009  . HYPERLIPIDEMIA 03/13/2009  . HYPERTENSION 03/13/2009  . INSOMNIA, CHRONIC 09/11/2009  . PAF (paroxysmal atrial fibrillation) ([place]) 04/07/2009   s/p ablation x 10  . POLYPECTOMY, HX OF 04/07/2009  . PULMONARY NODULE 09/11/2009  . Rosacea 03/13/2009  . SKIN CANCER, HX OF 03/13/2009   Past Surgical History:  Procedure Laterality Date  . ABLATION  2010   x2  . COLONOSCOPY W/ BIOPSIES AND POLYPECTOMY    . EYE SURGERY Bilateral    cataracts  . LUMBAR DISC SURGERY     RUPTURE  . LUMBAR LAMINECTOMY/DECOMPRESSION MICRODISCECTOMY N/A 04/20/2014   Procedure: LUMBAR TWO-THREE, LUMBAR THREE-FOUR, LUMAR FOUR-FIVE LAMINECTOMIES;  Surgeon: Newman Pies, MD;  Location: [facility];  Service: Neurosurgery;  Laterality: N/A;  . POLYPECTOMY    . SKIN CANCER EXCISION      reports that he quit smoking about 31 years ago. His smoking use included cigarettes. He has a 30.00 pack-year smoking history. His smokeless tobacco use includes chew. He reports that he does not drink alcohol or use drugs. family history includes CAD in his brother, brother, and sister; Cancer in his mother; Cancer (age of onset: 2) in his father; Heart attack in his father; Heart disease in his brother, brother, father, and sister. Allergies  Allergen  Reactions  . Amoxicillin-Pot Clavulanate Diarrhea and Nausea Only  . Losartan Other (See Comments)    Elevated creatinine levels      Observations/Objective: Patient sounds cheerful and well on the phone. I do not appreciate any SOB. Speech and thought processing are grossly intact. Patient reported vitals:  Assessment and Plan:  #1 frequent loose  stools past few weeks.  He is not any red flags such as vomiting, abdominal pain, weight loss, bloody stools, antibiotic use, recent travels, or change of medication  -Patient will present Wednesday for labs to further evaluate -Suggest they may try fiber supplement with FiberCon, Citrucel, or Metamucil -If symptoms persist may need an office exam to further evaluate  #2 hyperlipidemia -Future labs placed for Mevacor  #3 chronic kidney disease -Check basic metabolic panel  #4 history of atrial fibrillation maintained on Eliquis  #5 hypertension stable by home readings  Follow Up Instructions:  -Here for labs Wednesday 8:45 AM  [id number] 5-10 [id number] 11-20 [id number] 21-30 I did not refer this patient for an OV in the next 24 hours for this/these issue(s).  I discussed the assessment and treatment plan with the patient. The patient was provided an opportunity to ask questions and all were answered. The patient agreed with the plan and demonstrated an understanding of the instructions.   The patient was advised to call back or seek an in-person evaluation if the symptoms worsen or if the condition fails to improve as anticipated.  I provided 25 minutes of non-face-to-face time during this encounter.   Carolann Littler, MD

## 2019-03-16 ENCOUNTER — Other Ambulatory Visit: Payer: Medicare Other

## 2019-03-16 ENCOUNTER — Other Ambulatory Visit: Payer: Self-pay

## 2019-03-16 LAB — BASIC METABOLIC PANEL
BUN: 15 mg/dL (ref 6–23)
CO2: 27 mEq/L (ref 19–32)
Calcium: 9.2 mg/dL (ref 8.4–10.5)
Chloride: 102 mEq/L (ref 96–112)
Creatinine, Ser: 1.36 mg/dL (ref 0.40–1.50)
GFR: 49.8 mL/min — ABNORMAL LOW (ref >60.00)
Glucose, Bld: 107 mg/dL — ABNORMAL HIGH (ref 70–99)
Potassium: 4.5 mEq/L (ref 3.5–5.1)
Sodium: 137 mEq/L (ref 135–145)

## 2019-03-16 LAB — HEPATIC FUNCTION PANEL
ALT: 13 U/L (ref 0–53)
AST: 17 U/L (ref 0–37)
Albumin: 4.3 g/dL (ref 3.5–5.2)
Alkaline Phosphatase: 79 U/L (ref 39–117)
Bilirubin, Direct: 0.2 mg/dL (ref 0.0–0.3)
Total Bilirubin: 0.8 mg/dL (ref 0.2–1.2)
Total Protein: 6.6 g/dL (ref 6.0–8.3)

## 2019-03-16 LAB — CBC WITH DIFFERENTIAL/PLATELET
Basophils Absolute: 0 10*3/uL (ref 0.0–0.1)
Basophils Relative: 0.7 % (ref 0.0–3.0)
Eosinophils Absolute: 0.3 10*3/uL (ref 0.0–0.7)
Eosinophils Relative: 4 % (ref 0.0–5.0)
HCT: 44.1 % (ref 39.0–52.0)
Hemoglobin: 14.8 g/dL (ref 13.0–17.0)
Lymphocytes Relative: 30.3 % (ref 12.0–46.0)
Lymphs Abs: 2 10*3/uL (ref 0.7–4.0)
MCHC: 33.5 g/dL (ref 30.0–36.0)
MCV: 99.5 fl (ref 78.0–100.0)
Monocytes Absolute: 0.8 10*3/uL (ref 0.1–1.0)
Monocytes Relative: 12.2 % — ABNORMAL HIGH (ref 3.0–12.0)
Neutro Abs: 3.4 10*3/uL (ref 1.4–7.7)
Neutrophils Relative %: 52.8 % (ref 43.0–77.0)
Platelets: 186 10*3/uL (ref 150.0–400.0)
RBC: 4.43 Mil/uL (ref 4.22–5.81)
RDW: 14.4 % (ref 11.5–15.5)
WBC: 6.5 10*3/uL (ref 4.0–10.5)

## 2019-03-16 LAB — LIPID PANEL
Cholesterol: 143 mg/dL (ref 0–200)
HDL: 28.6 mg/dL — ABNORMAL LOW (ref >39.00)
LDL Cholesterol: 78 mg/dL (ref 0–99)
NonHDL: 114.25
Total CHOL/HDL Ratio: 5
Triglycerides: 182 mg/dL — ABNORMAL HIGH (ref 0.0–149.0)
VLDL: 36.4 mg/dL (ref 0.0–40.0)

## 2019-03-16 LAB — TSH: TSH: 3.02 u[IU]/mL (ref 0.35–4.50)

## 2019-03-22 ENCOUNTER — Other Ambulatory Visit: Payer: Self-pay

## 2019-03-22 MED ORDER — LOVASTATIN 20 MG PO TABS: 20.0000 mg | ORAL_TABLET | Freq: Every day | ORAL | 0 refills | Status: DC

## 2019-03-23 ENCOUNTER — Telehealth: Payer: Self-pay | Admitting: *Deleted

## 2019-03-23 NOTE — Telephone Encounter (Signed)

## 2019-03-24 ENCOUNTER — Other Ambulatory Visit: Payer: Self-pay

## 2019-03-24 ENCOUNTER — Encounter: Payer: Self-pay | Admitting: Cardiology

## 2019-03-24 ENCOUNTER — Encounter: Payer: Medicare Other | Admitting: Cardiology

## 2019-03-24 NOTE — Progress Notes (Signed)
This encounter was created in error - please disregard.

## 2019-03-25 ENCOUNTER — Other Ambulatory Visit: Payer: Self-pay

## 2019-04-05 ENCOUNTER — Other Ambulatory Visit: Payer: Self-pay | Admitting: Family Medicine

## 2019-04-07 ENCOUNTER — Other Ambulatory Visit: Payer: Self-pay

## 2019-04-07 ENCOUNTER — Observation Stay (HOSPITAL_COMMUNITY)
Admission: AD | Admit: 2019-04-07 | Discharge: 2019-04-08 | Disposition: A | Discharge status: Home / Self Care | Payer: Medicare Other | Source: Ambulatory Visit | Attending: Cardiology | Admitting: Cardiology

## 2019-04-07 ENCOUNTER — Telehealth (INDEPENDENT_AMBULATORY_CARE_PROVIDER_SITE_OTHER): Payer: Medicare Other | Admitting: Cardiology

## 2019-04-07 ENCOUNTER — Encounter: Payer: Self-pay | Admitting: Cardiology

## 2019-04-07 DIAGNOSIS — Z7901 Long term (current) use of anticoagulants: Secondary | ICD-10-CM

## 2019-04-07 DIAGNOSIS — Z880 Allergy status to penicillin: Secondary | ICD-10-CM | POA: Insufficient documentation

## 2019-04-07 DIAGNOSIS — I10 Essential (primary) hypertension: Secondary | ICD-10-CM

## 2019-04-07 DIAGNOSIS — Z8249 Family history of ischemic heart disease and other diseases of the circulatory system: Secondary | ICD-10-CM | POA: Insufficient documentation

## 2019-04-07 DIAGNOSIS — I498 Other specified cardiac arrhythmias: Secondary | ICD-10-CM

## 2019-04-07 DIAGNOSIS — E785 Hyperlipidemia, unspecified: Secondary | ICD-10-CM | POA: Diagnosis not present

## 2019-04-07 DIAGNOSIS — I4819 Other persistent atrial fibrillation: Secondary | ICD-10-CM

## 2019-04-07 DIAGNOSIS — I200 Unstable angina: Principal | ICD-10-CM | POA: Diagnosis present

## 2019-04-07 DIAGNOSIS — R008 Other abnormalities of heart beat: Secondary | ICD-10-CM | POA: Diagnosis not present

## 2019-04-07 DIAGNOSIS — G4700 Insomnia, unspecified: Secondary | ICD-10-CM | POA: Diagnosis not present

## 2019-04-07 DIAGNOSIS — K219 Gastro-esophageal reflux disease without esophagitis: Secondary | ICD-10-CM | POA: Insufficient documentation

## 2019-04-07 DIAGNOSIS — N400 Enlarged prostate without lower urinary tract symptoms: Secondary | ICD-10-CM | POA: Insufficient documentation

## 2019-04-07 DIAGNOSIS — F17290 Nicotine dependence, other tobacco product, uncomplicated: Secondary | ICD-10-CM | POA: Insufficient documentation

## 2019-04-07 DIAGNOSIS — E7800 Pure hypercholesterolemia, unspecified: Secondary | ICD-10-CM

## 2019-04-07 DIAGNOSIS — I4891 Unspecified atrial fibrillation: Secondary | ICD-10-CM | POA: Diagnosis present

## 2019-04-07 DIAGNOSIS — Z20828 Contact with and (suspected) exposure to other viral communicable diseases: Secondary | ICD-10-CM | POA: Insufficient documentation

## 2019-04-07 DIAGNOSIS — I493 Ventricular premature depolarization: Secondary | ICD-10-CM

## 2019-04-07 DIAGNOSIS — M1990 Unspecified osteoarthritis, unspecified site: Secondary | ICD-10-CM | POA: Diagnosis not present

## 2019-04-07 DIAGNOSIS — G629 Polyneuropathy, unspecified: Secondary | ICD-10-CM | POA: Insufficient documentation

## 2019-04-07 DIAGNOSIS — I480 Paroxysmal atrial fibrillation: Secondary | ICD-10-CM

## 2019-04-07 DIAGNOSIS — Z79899 Other long term (current) drug therapy: Secondary | ICD-10-CM | POA: Insufficient documentation

## 2019-04-07 DIAGNOSIS — Z7982 Long term (current) use of aspirin: Secondary | ICD-10-CM | POA: Diagnosis not present

## 2019-04-07 LAB — COMPREHENSIVE METABOLIC PANEL
ALT: 13 U/L (ref 0–44)
AST: 19 U/L (ref 15–41)
Albumin: 3.9 g/dL (ref 3.5–5.0)
Alkaline Phosphatase: 84 U/L (ref 38–126)
Anion gap: 10 (ref 5–15)
BUN: 16 mg/dL (ref 8–23)
CO2: 22 mmol/L (ref 22–32)
Calcium: 8.9 mg/dL (ref 8.9–10.3)
Chloride: 103 mmol/L (ref 98–111)
Creatinine, Ser: 1.56 mg/dL — ABNORMAL HIGH (ref 0.61–1.24)
GFR calc Af Amer: 46 mL/min — ABNORMAL LOW (ref >60)
GFR calc non Af Amer: 40 mL/min — ABNORMAL LOW (ref >60)
Glucose, Bld: 127 mg/dL — ABNORMAL HIGH (ref 70–99)
Potassium: 3.9 mmol/L (ref 3.5–5.1)
Sodium: 135 mmol/L (ref 135–145)
Total Bilirubin: 0.6 mg/dL (ref 0.3–1.2)
Total Protein: 6.7 g/dL (ref 6.5–8.1)

## 2019-04-07 LAB — CBC WITH DIFFERENTIAL/PLATELET
Abs Immature Granulocytes: 0.11 10*3/uL — ABNORMAL HIGH (ref 0.00–0.07)
Basophils Absolute: 0 10*3/uL (ref 0.0–0.1)
Basophils Relative: 1 %
Eosinophils Absolute: 0.2 10*3/uL (ref 0.0–0.5)
Eosinophils Relative: 4 %
HCT: 42.6 % (ref 39.0–52.0)
Hemoglobin: 14.4 g/dL (ref 13.0–17.0)
Immature Granulocytes: 2 %
Lymphocytes Relative: 27 %
Lymphs Abs: 1.8 10*3/uL (ref 0.7–4.0)
MCH: 33 pg (ref 26.0–34.0)
MCHC: 33.8 g/dL (ref 30.0–36.0)
MCV: 97.5 fL (ref 80.0–100.0)
Monocytes Absolute: 0.9 10*3/uL (ref 0.1–1.0)
Monocytes Relative: 14 %
Neutro Abs: 3.6 10*3/uL (ref 1.7–7.7)
Neutrophils Relative %: 52 %
Platelets: 188 10*3/uL (ref 150–400)
RBC: 4.37 MIL/uL (ref 4.22–5.81)
RDW: 13.4 % (ref 11.5–15.5)
WBC: 6.7 10*3/uL (ref 4.0–10.5)
nRBC: 0 % (ref 0.0–0.2)

## 2019-04-07 LAB — HEMOGLOBIN A1C
Hgb A1c MFr Bld: 5.7 % — ABNORMAL HIGH (ref 4.8–5.6)
Mean Plasma Glucose: 116.89 mg/dL

## 2019-04-07 LAB — TROPONIN I (HIGH SENSITIVITY)
Troponin I (High Sensitivity): 3 ng/L (ref <18)
Troponin I (High Sensitivity): 3 ng/L (ref <18)

## 2019-04-07 LAB — MAGNESIUM: Magnesium: 1.9 mg/dL (ref 1.7–2.4)

## 2019-04-07 LAB — MRSA PCR SCREENING: MRSA by PCR: NEGATIVE

## 2019-04-07 MED ORDER — TAMSULOSIN HCL 0.4 MG PO CAPS
0.4000 mg | ORAL_CAPSULE | Freq: Every day | ORAL | Status: DC
  Administered 2019-04-07: 0.4 mg via ORAL
  Filled 2019-04-07: qty 1

## 2019-04-07 MED ORDER — HEPARIN BOLUS VIA INFUSION
2000.0000 [IU] | Freq: Once | INTRAVENOUS | Status: AC
  Administered 2019-04-07: 2000 [IU] via INTRAVENOUS
  Filled 2019-04-07: qty 2000

## 2019-04-07 MED ORDER — METOPROLOL SUCCINATE ER 25 MG PO TB24
25.0000 mg | ORAL_TABLET | Freq: Every day | ORAL | Status: DC
  Administered 2019-04-08: 25 mg via ORAL
  Filled 2019-04-07: qty 1

## 2019-04-07 MED ORDER — CHLORHEXIDINE GLUCONATE CLOTH 2 % EX PADS
6.0000 | MEDICATED_PAD | Freq: Every day | CUTANEOUS | Status: DC
  Administered 2019-04-08: 6 via TOPICAL

## 2019-04-07 MED ORDER — GABAPENTIN 100 MG PO CAPS
200.0000 mg | ORAL_CAPSULE | Freq: Every day | ORAL | Status: DC
  Administered 2019-04-07: 200 mg via ORAL
  Filled 2019-04-07: qty 2

## 2019-04-07 MED ORDER — NITROGLYCERIN 0.4 MG SL SUBL: 0.4000 mg | SUBLINGUAL_TABLET | SUBLINGUAL | Status: DC | PRN

## 2019-04-07 MED ORDER — GABAPENTIN 100 MG PO CAPS
100.0000 mg | ORAL_CAPSULE | Freq: Every day | ORAL | Status: DC
  Administered 2019-04-08: 100 mg via ORAL
  Filled 2019-04-07: qty 1

## 2019-04-07 MED ORDER — HEPARIN (PORCINE) 25000 UT/250ML-% IV SOLN
900.0000 [IU]/h | INTRAVENOUS | Status: DC
  Administered 2019-04-07: 1200 [IU]/h via INTRAVENOUS
  Filled 2019-04-07: qty 250

## 2019-04-07 MED ORDER — AMLODIPINE BESYLATE 2.5 MG PO TABS
2.5000 mg | ORAL_TABLET | Freq: Every day | ORAL | Status: DC
  Administered 2019-04-08: 2.5 mg via ORAL
  Filled 2019-04-07 (×2): qty 1

## 2019-04-07 MED ORDER — ASPIRIN EC 81 MG PO TBEC
81.0000 mg | DELAYED_RELEASE_TABLET | Freq: Every day | ORAL | Status: DC
  Administered 2019-04-08: 81 mg via ORAL
  Filled 2019-04-07: qty 1

## 2019-04-07 MED ORDER — PRAVASTATIN SODIUM 10 MG PO TABS: 20.0000 mg | ORAL_TABLET | Freq: Every day | ORAL | Status: DC

## 2019-04-07 MED ORDER — PANTOPRAZOLE SODIUM 20 MG PO TBEC
20.0000 mg | DELAYED_RELEASE_TABLET | Freq: Every day | ORAL | Status: DC
  Filled 2019-04-07: qty 1

## 2019-04-07 MED ORDER — ONDANSETRON HCL 4 MG/2ML IJ SOLN: 4.0000 mg | Freq: Four times a day (QID) | INTRAMUSCULAR | Status: DC | PRN

## 2019-04-07 MED ORDER — ACETAMINOPHEN 325 MG PO TABS: 650.0000 mg | ORAL_TABLET | ORAL | Status: DC | PRN

## 2019-04-07 NOTE — Progress Notes (Signed)
ANTICOAGULATION CONSULT NOTE - Follow Up Consult  Pharmacy Consult for Eliquis to Heparin Indication: atrial fibrillation  / chest pain  Allergies  Allergen Reactions  . Amoxicillin-Pot Clavulanate Diarrhea and Nausea Only  . Amoxicillin-Pot Clavulanate Diarrhea and Nausea Only  . Losartan Other (See Comments) and Nausea And Vomiting    Elevated creatinine levels Elevated creatinine levels    Patient Measurements: Height: 6\' 2"  (188 cm) Weight: 192 lb (87.1 kg) IBW/kg (Calculated) : 82.2 Heparin Dosing Weight: 87 kg  Vital Signs: Temp: 98.4 F (36.9 C) (08/13 1833) Temp Source: Oral (08/13 1833) BP: 136/71 (08/13 1426) Pulse Rate: 66 (08/13 1426)  Assessment: 85 year old male on Eliquis prior to admission for Afib, last dose of Eliquis this AM at 7:30 am, now admitted for chest pain with plans for cath vs stress test  Will use PTT and heparin levels until levels correlate   Goal of Therapy:  Heparin level 0.3-0.7 units/ml Monitor platelets by anticoagulation protocol: Yes  PTT = 66 to 102 seconds   Plan:  Heparin 2000 units iv bolus x 1  Heparin drip at 1200 units / hr Heparin level and PTT 8 hours after heparin begins Daily heparin level, PTT, CBC  Thank you Anette Guarneri, PharmD [phone]  04/07/2019,7:33 PM

## 2019-04-07 NOTE — H&P (Signed)
Virtual Visit via Telephone Note   This visit type was conducted due to national recommendations for restrictions regarding the COVID-19 Pandemic (e.g. social distancing) in an effort to limit this patient's exposure and mitigate transmission in our community.  Due to his co-morbid illnesses, this patient is at least at moderate risk for complications without adequate follow up.  This format is felt to be most appropriate for this patient at this time.  The patient did not have access to video technology/had technical difficulties with video requiring transitioning to audio format only (telephone).  All issues noted in this document were discussed and addressed.  No physical exam could be performed with this format.  Please refer to the patient's chart for his  consent to telehealth for [facility].  Evaluation Performed:  Follow-up visit  This visit type was conducted due to national recommendations for restrictions regarding the COVID-19 Pandemic (e.g. social distancing).  This format is felt to be most appropriate for this patient at this time.  All issues noted in this document were discussed and addressed.  No physical exam was performed (except for noted visual exam findings with Video Visits).  Please refer to the patient's chart (MyChart message for video visits and phone note for telephone visits) for the patient's consent to telehealth for [facility].  Date:  04/07/2019   ID:  [name], DOB 08/11/1932, MRN [id number]  Patient Location:  home  Provider location:   [place]  PCP:  [name], MD           Cardiologist:  Fransico Him, MD Electrophysiologist:  None   Chief Complaint:  Atrial fibrillation and HTN  History of Present Illness:    [name] is a 85 y.o. male who presents via audio/video conferencing for a telehealth visit today.    Steven Tramell Billingsis an 85 y.o.malewith a hx  ofpersistentatrial  fibrillations/p afib [facility] x 2 in 2010, HTN and hyperlipidemia. He has a hx of very atypical chest pain and nuclear stress test was low risk with no ischemia. He also has a hx of PACs and PVCs.  He is here today for followup and is doing well.  He has recently been having significant chest pain.  This has been occurring off and on for a year bur recently has become more severe and now is happening a few a day for the past week.  The pain is nonexertional and is not associated with diaphoresis. He has had worsening SOB and any movement he gets very SOB and has noticed extreme exertional fatigue.  He has chronic back problems with bother him but now cannot walk to the mailbox due to SOB.  He has actually awakened a few times at night with CP which is new and has had some Orthopnea.  He has noticed increasing LE edema. He denies any  palpitations or syncope. He is compliant with his meds and is tolerating meds with no SE.    The patient does not have symptoms concerning for COVID-19 infection (fever, chills, cough, or new shortness of breath).    Prior CV studies:   The following studies were reviewed today:  none      Past Medical History:  Diagnosis Date  . Actinic keratosis 03/13/2009  . Arthritis   . BENIGN PROSTATIC HYPERTROPHY, HX OF 04/07/2009  . CARCINOMA, SKIN, SQUAMOUS CELL 09/11/2009  . COLONIC POLYPS,  HX OF 03/13/2009  . Dysrhythmia    hx afib  . GERD 03/13/2009  . HYPERLIPIDEMIA 03/13/2009  . HYPERTENSION 03/13/2009  . INSOMNIA, CHRONIC 09/11/2009  . PAF (paroxysmal atrial fibrillation) ([place]) 04/07/2009   s/p ablation x 10  . POLYPECTOMY, HX OF 04/07/2009  . PULMONARY NODULE 09/11/2009  . Rosacea 03/13/2009  . SKIN CANCER, HX OF 03/13/2009        Past Surgical History:  Procedure Laterality Date  . ABLATION  2010   x2  . COLONOSCOPY W/ BIOPSIES AND POLYPECTOMY    . EYE SURGERY Bilateral    cataracts  . LUMBAR DISC SURGERY     RUPTURE  . LUMBAR  LAMINECTOMY/DECOMPRESSION MICRODISCECTOMY N/A 04/20/2014   Procedure: LUMBAR TWO-THREE, LUMBAR THREE-FOUR, LUMAR FOUR-FIVE LAMINECTOMIES;  Surgeon: Newman Pies, MD;  Location: [facility];  Service: Neurosurgery;  Laterality: N/A;  . POLYPECTOMY    . SKIN CANCER EXCISION       Active Medications      Current Meds  Medication Sig  . amLODipine (NORVASC) 2.5 MG tablet TAKE 1 TABLET BY MOUTH ONCE DAILY  . apixaban (ELIQUIS) 5 MG TABS tablet Take 1 tablet (5 mg total) by mouth 2 (two) times daily.  . clobetasol ointment (TEMOVATE) 4.09 % Apply 1 application topically 2 (two) times daily.   . furosemide (LASIX) 20 MG tablet Take 1 tablet (20 mg total) by mouth daily. (Patient taking differently: Take 20 mg by mouth daily as needed for fluid. )  . gabapentin (NEURONTIN) 100 MG capsule TAKE 1 CAPSULE BY MOUTH IN  THE MORNING AND 2 CAPSULES  AT BEDTIME  . lansoprazole (PREVACID) 15 MG capsule Take 15 mg by mouth daily at 12 noon.  . lovastatin (MEVACOR) 20 MG tablet Take 1 tablet (20 mg total) by mouth at bedtime.  . metoprolol succinate (TOPROL-XL) 25 MG 24 hr tablet TAKE 1 TABLET BY MOUTH  DAILY  . metroNIDAZOLE (METROCREAM) 0.75 % cream APPLY TOPICALLY 3 TIMES A  WEEK ON THE DAYS HE SHAVES  . tamsulosin (FLOMAX) 0.4 MG CAPS capsule TAKE 1 CAPSULE BY MOUTH  DAILY  . vitamin B-12 (CYANOCOBALAMIN) 500 MCG tablet Take 500 mcg by mouth daily.  Marland Kitchen VITAMIN D PO Take 1 capsule by mouth daily.       Allergies:   Amoxicillin-pot clavulanate, Amoxicillin-pot clavulanate, and Losartan   Social History        Tobacco Use  . Smoking status: Former Smoker    Packs/day: 1.00    Years: 30.00    Pack years: 30.00    Types: Cigarettes    Quit date: 03/13/1988    Years since quitting: 31.0  . Smokeless tobacco: Current User    Types: Chew  Substance Use Topics  . Alcohol use: No  . Drug use: No     Family Hx: The patient's family history includes CAD in his brother,  brother, and sister; Cancer in his mother; Cancer (age of onset: 29) in his father; Heart attack in his father; Heart disease in his brother, brother, father, and sister.  ROS:   Please see the history of present illness.     All other systems reviewed and are negative.   Labs/Other Tests and Data Reviewed:    Recent Labs: 03/16/2019: ALT 13; BUN 15; Creatinine, Ser 1.36; Hemoglobin 14.8; Platelets 186.0; Potassium 4.5; Sodium 137; TSH 3.02   Recent Lipid Panel Labs (Brief)       Lab Results  Component Value Date/Time   CHOL 143  03/16/2019 09:12 AM   TRIG 182.0 (H) 03/16/2019 09:12 AM   HDL 28.60 (L) 03/16/2019 09:12 AM   CHOLHDL 5 03/16/2019 09:12 AM   LDLCALC 78 03/16/2019 09:12 AM   LDLDIRECT 79.3 09/30/2013 10:38 AM         Wt Readings from Last 3 Encounters:  04/07/19 191 lb (86.6 kg)  03/24/19 191 lb (86.6 kg)  09/07/18 186 lb 6.4 oz (84.6 kg)     Objective:    Vital Signs:  BP 136/71 Comment: 116/57 and 162/73 this morning.  Pulse 66   Ht 6\' 2"  (1.88 m)   Wt 191 lb (86.6 kg)   BMI 24.52 kg/m     ASSESSMENT & PLAN:    1.  Unstable angina -he has had some mild CP at times throughout the past year but now the pain is occurring on a daily basis and awakening him at night -the pain is a pressure and associated with DOE which has significantly worsened along with exertional fatigue to the point he cannot do his normal activities. -will admit to tele bed -Cycle trop -check 2D echo for LVF -hold Elqius for now in case he needs cath -IV heparin per pharmacy -ASA 81mg  daily -continue BB and statin -further workup with cath vs. Nuclear stress test pending results of echo and trop  2.  Persistent atrial fibrillation -s/p afib ablation -he has not had an reoccurence of his palpitations -CHADS2VASC score is 3 and he is on Eliquis 5mg  BID. -he has not had any bleeding problems on the DOAC.  3.  Hypertension  -His BP is controlled on exam  today.   -he will continue on amlodipine 2.5mg  daily and Toprol XL 25mg  daily.   4.  Hyperlipidemia -he will continue on Lovastatin 20mg  daily -LDL was 78 recently -this is followed by his PCP  5.  PVCs -he has not had any palpitations -contnue on BB  COVID-19 Education: The signs and symptoms of COVID-19 were discussed with the patient and how to seek care for testing (follow up with PCP or arrange E-visit).  The importance of social distancing was discussed today.  Patient Risk:   After full review of this patient's clinical status, I feel that they are at least moderate risk at this time.  Time:   Today, I have spent 20 minutes directly with the patient on telephone discussing medical problems including afib, HTN, HLD, PVCs.  We also reviewed the symptoms of COVID 19 and the ways to protect against contracting the virus with telehealth technology.  I spent an additional 5 minutes reviewing patient's chart including labs.  Medication Adjustments/Labs and Tests Ordered: Current medicines are reviewed at length with the patient today.  Concerns regarding medicines are outlined above.  Tests Ordered: No orders of the defined types were placed in this encounter.  Medication Changes: No orders of the defined types were placed in this encounter.   Disposition:  Direct admit to [facility] for further evaluation  Signed, Fransico Him, MD  04/07/2019 2:42 PM    [place]

## 2019-04-07 NOTE — Progress Notes (Signed)
Paged provider for COVID test order

## 2019-04-07 NOTE — Progress Notes (Signed)
Virtual Visit via Telephone Note   This visit type was conducted due to national recommendations for restrictions regarding the COVID-19 Pandemic (e.g. social distancing) in an effort to limit this patient's exposure and mitigate transmission in our community.  Due to his co-morbid illnesses, this patient is at least at moderate risk for complications without adequate follow up.  This format is felt to be most appropriate for this patient at this time.  The patient did not have access to video technology/had technical difficulties with video requiring transitioning to audio format only (telephone).  All issues noted in this document were discussed and addressed.  No physical exam could be performed with this format.  Please refer to the patient's chart for his  consent to telehealth for [facility].  Evaluation Performed:  Follow-up visit  This visit type was conducted due to national recommendations for restrictions regarding the COVID-19 Pandemic (e.g. social distancing).  This format is felt to be most appropriate for this patient at this time.  All issues noted in this document were discussed and addressed.  No physical exam was performed (except for noted visual exam findings with Video Visits).  Please refer to the patient's chart (MyChart message for video visits and phone note for telephone visits) for the patient's consent to telehealth for [facility].  Date:  04/07/2019   ID:  [name], DOB Jul 20, 1932, MRN [id number]  Patient Location:  home  Provider location:   [place]  PCP:  [name], MD  Cardiologist:  Fransico Him, MD Electrophysiologist:  None   Chief Complaint:  Atrial fibrillation and HTN  History of Present Illness:    [name] is a 85 y.o. male who presents via audio/video conferencing for a telehealth visit today.    [name] is an 85 y.o. male with a hx  of persistent atrial fibrillation s/p afib ablation at [facility] x 2 in  2010, HTN and hyperlipidemia.  He has a hx of very atypical chest pain and nuclear stress test was low risk with no ischemia.  He also has a hx of PACs and PVCs.  He is here today for followup and is doing well.  He has recently been having significant chest pain.  This has been occurring off and on for a year bur recently has become more severe and now is happening a few a day for the past week.  The pain is nonexertional and is not associated with diaphoresis. He has had worsening SOB and any movement he gets very SOB and has noticed extreme exertional fatigue.  He has chronic back problems with bother him but now cannot walk to the mailbox due to SOB.  He has actually awakened a few times at night with CP which is new and has had some Orthopnea.  He has noticed increasing LE edema. He denies any  palpitations or syncope. He is compliant with his meds and is tolerating meds with no SE.    The patient does not have symptoms concerning for COVID-19 infection (fever, chills, cough, or new shortness of breath).    Prior CV studies:   The following studies were reviewed today:  none  Past Medical History:  Diagnosis Date  . Actinic keratosis 03/13/2009  . Arthritis   . BENIGN PROSTATIC HYPERTROPHY, HX OF 04/07/2009  . CARCINOMA, SKIN, SQUAMOUS CELL 09/11/2009  . COLONIC POLYPS, HX OF 03/13/2009  . Dysrhythmia    hx afib  . GERD 03/13/2009  . HYPERLIPIDEMIA 03/13/2009  .  HYPERTENSION 03/13/2009  . INSOMNIA, CHRONIC 09/11/2009  . PAF (paroxysmal atrial fibrillation) ([place]) 04/07/2009   s/p ablation x 10  . POLYPECTOMY, HX OF 04/07/2009  . PULMONARY NODULE 09/11/2009  . Rosacea 03/13/2009  . SKIN CANCER, HX OF 03/13/2009   Past Surgical History:  Procedure Laterality Date  . ABLATION  2010   x2  . COLONOSCOPY W/ BIOPSIES AND POLYPECTOMY    . EYE SURGERY Bilateral    cataracts  . LUMBAR DISC SURGERY     RUPTURE  . LUMBAR LAMINECTOMY/DECOMPRESSION MICRODISCECTOMY N/A 04/20/2014   Procedure:  LUMBAR TWO-THREE, LUMBAR THREE-FOUR, LUMAR FOUR-FIVE LAMINECTOMIES;  Surgeon: Newman Pies, MD;  Location: [facility];  Service: Neurosurgery;  Laterality: N/A;  . POLYPECTOMY    . SKIN CANCER EXCISION       Current Meds  Medication Sig  . amLODipine (NORVASC) 2.5 MG tablet TAKE 1 TABLET BY MOUTH ONCE DAILY  . apixaban (ELIQUIS) 5 MG TABS tablet Take 1 tablet (5 mg total) by mouth 2 (two) times daily.  . clobetasol ointment (TEMOVATE) 1.24 % Apply 1 application topically 2 (two) times daily.   . furosemide (LASIX) 20 MG tablet Take 1 tablet (20 mg total) by mouth daily. (Patient taking differently: Take 20 mg by mouth daily as needed for fluid. )  . gabapentin (NEURONTIN) 100 MG capsule TAKE 1 CAPSULE BY MOUTH IN  THE MORNING AND 2 CAPSULES  AT BEDTIME  . lansoprazole (PREVACID) 15 MG capsule Take 15 mg by mouth daily at 12 noon.  . lovastatin (MEVACOR) 20 MG tablet Take 1 tablet (20 mg total) by mouth at bedtime.  . metoprolol succinate (TOPROL-XL) 25 MG 24 hr tablet TAKE 1 TABLET BY MOUTH  DAILY  . metroNIDAZOLE (METROCREAM) 0.75 % cream APPLY TOPICALLY 3 TIMES A  WEEK ON THE DAYS HE SHAVES  . tamsulosin (FLOMAX) 0.4 MG CAPS capsule TAKE 1 CAPSULE BY MOUTH  DAILY  . vitamin B-12 (CYANOCOBALAMIN) 500 MCG tablet Take 500 mcg by mouth daily.  Marland Kitchen VITAMIN D PO Take 1 capsule by mouth daily.     Allergies:   Amoxicillin-pot clavulanate, Amoxicillin-pot clavulanate, and Losartan   Social History   Tobacco Use  . Smoking status: Former Smoker    Packs/day: 1.00    Years: 30.00    Pack years: 30.00    Types: Cigarettes    Quit date: 03/13/1988    Years since quitting: 31.0  . Smokeless tobacco: Current User    Types: Chew  Substance Use Topics  . Alcohol use: No  . Drug use: No     Family Hx: The patient's family history includes CAD in his brother, brother, and sister; Cancer in his mother; Cancer (age of onset: 14) in his father; Heart attack in his father; Heart disease in his  brother, brother, father, and sister.  ROS:   Please see the history of present illness.     All other systems reviewed and are negative.   Labs/Other Tests and Data Reviewed:    Recent Labs: 03/16/2019: ALT 13; BUN 15; Creatinine, Ser 1.36; Hemoglobin 14.8; Platelets 186.0; Potassium 4.5; Sodium 137; TSH 3.02   Recent Lipid Panel Lab Results  Component Value Date/Time   CHOL 143 03/16/2019 09:12 AM   TRIG 182.0 (H) 03/16/2019 09:12 AM   HDL 28.60 (L) 03/16/2019 09:12 AM   CHOLHDL 5 03/16/2019 09:12 AM   LDLCALC 78 03/16/2019 09:12 AM   LDLDIRECT 79.3 09/30/2013 10:38 AM    Wt Readings from Last 3  Encounters:  04/07/19 191 lb (86.6 kg)  03/24/19 191 lb (86.6 kg)  09/07/18 186 lb 6.4 oz (84.6 kg)     Objective:    Vital Signs:  BP 136/71 Comment: 116/57 and 162/73 this morning.  Pulse 66   Ht 6\' 2"  (1.88 m)   Wt 191 lb (86.6 kg)   BMI 24.52 kg/m     ASSESSMENT & PLAN:    1.  Unstable angina -he has had some mild CP at times throughout the past year but now the pain is occurring on a daily basis and awakening him at night -the pain is a pressure and associated with DOE which has significantly worsened along with exertional fatigue to the point he cannot do his normal activities. -will admit to tele bed -Cycle trop -check 2D echo for LVF -hold Elqius for now in case he needs cath -IV heparin per pharmacy -ASA 81mg  daily -continue BB and statin -further workup with cath vs. Nuclear stress test pending results of echo and trop  2.  Persistent atrial fibrillation -s/p afib ablation -he has not had an reoccurence of his palpitations -CHADS2VASC score is 3 and he is on Eliquis 5mg  BID. -he has not had any bleeding problems on the DOAC.  3.  Hypertension  -His BP is controlled on exam today.   -he will continue on amlodipine 2.5mg  daily and Toprol XL 25mg  daily.   4.  Hyperlipidemia -he will continue on Lovastatin 20mg  daily -LDL was 78 recently -this is  followed by his PCP  5.  PVCs -he has not had any palpitations -contnue on BB  COVID-19 Education: The signs and symptoms of COVID-19 were discussed with the patient and how to seek care for testing (follow up with PCP or arrange E-visit).  The importance of social distancing was discussed today.  Patient Risk:   After full review of this patient's clinical status, I feel that they are at least moderate risk at this time.  Time:   Today, I have spent 20 minutes directly with the patient on telephone discussing medical problems including afib, HTN, HLD, PVCs.  We also reviewed the symptoms of COVID 19 and the ways to protect against contracting the virus with telehealth technology.  I spent an additional 5 minutes reviewing patient's chart including labs.  Medication Adjustments/Labs and Tests Ordered: Current medicines are reviewed at length with the patient today.  Concerns regarding medicines are outlined above.  Tests Ordered: No orders of the defined types were placed in this encounter.  Medication Changes: No orders of the defined types were placed in this encounter.   Disposition:  Follow up in 6 month(s)  Signed, Fransico Him, MD  04/07/2019 2:42 PM    [facility]

## 2019-04-07 NOTE — Progress Notes (Signed)
Provider paged to advise patient had arrived on unit. Page returned ordered being placed per MD.

## 2019-04-08 ENCOUNTER — Other Ambulatory Visit: Payer: Self-pay | Admitting: Medical

## 2019-04-08 ENCOUNTER — Observation Stay (HOSPITAL_BASED_OUTPATIENT_CLINIC_OR_DEPARTMENT_OTHER): Payer: Medicare Other

## 2019-04-08 DIAGNOSIS — I4819 Other persistent atrial fibrillation: Secondary | ICD-10-CM | POA: Diagnosis not present

## 2019-04-08 DIAGNOSIS — I499 Cardiac arrhythmia, unspecified: Secondary | ICD-10-CM | POA: Diagnosis not present

## 2019-04-08 DIAGNOSIS — K219 Gastro-esophageal reflux disease without esophagitis: Secondary | ICD-10-CM | POA: Diagnosis not present

## 2019-04-08 DIAGNOSIS — G629 Polyneuropathy, unspecified: Secondary | ICD-10-CM | POA: Diagnosis not present

## 2019-04-08 DIAGNOSIS — R0789 Other chest pain: Secondary | ICD-10-CM | POA: Diagnosis not present

## 2019-04-08 DIAGNOSIS — I200 Unstable angina: Secondary | ICD-10-CM | POA: Diagnosis not present

## 2019-04-08 DIAGNOSIS — F17290 Nicotine dependence, other tobacco product, uncomplicated: Secondary | ICD-10-CM | POA: Diagnosis not present

## 2019-04-08 DIAGNOSIS — Z20828 Contact with and (suspected) exposure to other viral communicable diseases: Secondary | ICD-10-CM | POA: Diagnosis not present

## 2019-04-08 DIAGNOSIS — G4700 Insomnia, unspecified: Secondary | ICD-10-CM | POA: Diagnosis not present

## 2019-04-08 DIAGNOSIS — I10 Essential (primary) hypertension: Secondary | ICD-10-CM | POA: Diagnosis not present

## 2019-04-08 DIAGNOSIS — I493 Ventricular premature depolarization: Secondary | ICD-10-CM | POA: Diagnosis not present

## 2019-04-08 DIAGNOSIS — I371 Nonrheumatic pulmonary valve insufficiency: Secondary | ICD-10-CM | POA: Diagnosis not present

## 2019-04-08 DIAGNOSIS — M1990 Unspecified osteoarthritis, unspecified site: Secondary | ICD-10-CM | POA: Diagnosis not present

## 2019-04-08 DIAGNOSIS — N400 Enlarged prostate without lower urinary tract symptoms: Secondary | ICD-10-CM | POA: Diagnosis not present

## 2019-04-08 DIAGNOSIS — E785 Hyperlipidemia, unspecified: Secondary | ICD-10-CM | POA: Diagnosis not present

## 2019-04-08 DIAGNOSIS — I498 Other specified cardiac arrhythmias: Secondary | ICD-10-CM

## 2019-04-08 LAB — LIPID PANEL
Cholesterol: 128 mg/dL (ref 0–200)
HDL: 28 mg/dL — ABNORMAL LOW (ref >40)
LDL Cholesterol: 87 mg/dL (ref 0–99)
Total CHOL/HDL Ratio: 4.6 RATIO
Triglycerides: 66 mg/dL (ref <150)
VLDL: 13 mg/dL (ref 0–40)

## 2019-04-08 LAB — CBC
HCT: 42 % (ref 39.0–52.0)
Hemoglobin: 13.8 g/dL (ref 13.0–17.0)
MCH: 32.8 pg (ref 26.0–34.0)
MCHC: 32.9 g/dL (ref 30.0–36.0)
MCV: 99.8 fL (ref 80.0–100.0)
Platelets: 197 10*3/uL (ref 150–400)
RBC: 4.21 MIL/uL — ABNORMAL LOW (ref 4.22–5.81)
RDW: 13.6 % (ref 11.5–15.5)
WBC: 6.7 10*3/uL (ref 4.0–10.5)
nRBC: 0 % (ref 0.0–0.2)

## 2019-04-08 LAB — SARS CORONAVIRUS 2 BY RT PCR (HOSPITAL ORDER, PERFORMED IN ~~LOC~~ HOSPITAL LAB): SARS Coronavirus 2: NEGATIVE

## 2019-04-08 LAB — HEPARIN LEVEL (UNFRACTIONATED): Heparin Unfractionated: >2.2 IU/mL — ABNORMAL HIGH (ref 0.30–0.70)

## 2019-04-08 LAB — ECHOCARDIOGRAM COMPLETE
Height: 74 in
Weight: 3025.6 oz

## 2019-04-08 LAB — APTT: aPTT: 154 seconds — ABNORMAL HIGH (ref 24–36)

## 2019-04-08 MED ORDER — FUROSEMIDE 20 MG PO TABS: 20.0000 mg | ORAL_TABLET | Freq: Every day | ORAL | Status: DC | PRN

## 2019-04-08 MED ORDER — PRAVASTATIN SODIUM 20 MG PO TABS: 20.0000 mg | ORAL_TABLET | Freq: Every day | ORAL | 1 refills | Status: DC

## 2019-04-08 MED ORDER — NITROGLYCERIN 0.4 MG SL SUBL: 0.4000 mg | SUBLINGUAL_TABLET | SUBLINGUAL | 1 refills | Status: DC | PRN

## 2019-04-08 MED ORDER — NITROGLYCERIN 0.4 MG SL SUBL: 0.4000 mg | SUBLINGUAL_TABLET | SUBLINGUAL | 0 refills | Status: DC | PRN

## 2019-04-08 NOTE — Progress Notes (Signed)
Paged provider to see if patient could eat; Patient requesting to eat; Awaiting orders.

## 2019-04-08 NOTE — Progress Notes (Signed)
ANTICOAGULATION CONSULT NOTE - Follow Up Consult  Pharmacy Consult for Heparin (Apixaban on hold) Indication: atrial fibrillation  / chest pain  Allergies  Allergen Reactions  . Amoxicillin-Pot Clavulanate Diarrhea and Nausea Only  . Amoxicillin-Pot Clavulanate Diarrhea and Nausea Only  . Losartan Other (See Comments) and Nausea And Vomiting    Elevated creatinine levels Elevated creatinine levels    Patient Measurements: Height: 6\' 2"  (188 cm) Weight: 192 lb (87.1 kg) IBW/kg (Calculated) : 82.2 Heparin Dosing Weight: 87 kg  Vital Signs: Temp: 97.7 F (36.5 C) (08/13 2331) Temp Source: Oral (08/13 2331) BP: 137/56 (08/13 2331) Pulse Rate: 59 (08/13 2331)  Assessment: 85 year old male on Eliquis prior to admission for Afib, last dose of Eliquis this AM at 7:30 am, now admitted for chest pain with plans for cath vs stress test  Will use PTT and heparin levels until levels correlate  8/14 AM update:  APTT is elevated, no issues per RN, CBC stable  Goal of Therapy:  Heparin level 0.3-0.7 units/ml  APTT 66-102 secs Monitor platelets by anticoagulation protocol: Yes     Plan:  Dec heparin to 900 units/hr Re-check aPTT in 8 hours  Narda Bonds, PharmD, [place] Pharmacist Phone: [phone]

## 2019-04-08 NOTE — Progress Notes (Signed)
Progress Note  Patient Name: [name] Date of Encounter: 04/08/2019  Primary Cardiologist: No primary care provider on file.  Dr. Radford Pax  Subjective   Feeling better.  No chest pain.  Came in after feeling more short of breath with activity.  Minimal sharp chest discomfort fleeting this morning.  Atypical.  Inpatient Medications    Scheduled Meds: . amLODipine  2.5 mg Oral Daily  . aspirin EC  81 mg Oral Daily  . Chlorhexidine Gluconate Cloth  6 each Topical Q0600  . gabapentin  100 mg Oral Daily  . gabapentin  200 mg Oral QHS  . metoprolol succinate  25 mg Oral Daily  . pantoprazole  20 mg Oral Q1200  . pravastatin  20 mg Oral q1800  . tamsulosin  0.4 mg Oral QHS   Continuous Infusions: . heparin 900 Units/hr (04/08/19 0549)   PRN Meds: acetaminophen, nitroGLYCERIN, ondansetron (ZOFRAN) IV   Vital Signs    Vitals:   04/07/19 1939 04/07/19 2331 04/08/19 0553 04/08/19 0728  BP: (!) 154/68 (!) 137/56 125/74   Pulse: 61 (!) 59 63   Resp: 14 16 15    Temp: 97.6 F (36.4 C) 97.7 F (36.5 C) 97.7 F (36.5 C) 97.7 F (36.5 C)  TempSrc: Oral Oral Oral Oral  SpO2: 97% 98% 97% 97%  Weight:   85.8 kg   Height:        Intake/Output Summary (Last 24 hours) at 04/08/2019 0856 Last data filed at 04/08/2019 0549 Gross per 24 hour  Intake 306.8 ml  Output 550 ml  Net -243.2 ml   Last 3 Weights 04/08/2019 04/07/2019 04/07/2019  Weight (lbs) 189 lb 1.6 oz 192 lb 191 lb  Weight (kg) 85.775 kg 87.091 kg 86.637 kg      Telemetry    No adverse arrhythmias- Personally Reviewed  ECG    Normal EKG- Personally Reviewed  Physical Exam  Elderly GEN: No acute distress.   Neck: No JVD Cardiac: RRR, no murmurs, rubs, or gallops.  Respiratory: Clear to auscultation bilaterally. GI: Soft, nontender, non-distended  MS: No edema; No deformity. Neuro:  Nonfocal  Psych: Normal affect   Labs    High Sensitivity Troponin:   Recent Labs  Lab 04/07/19 1917 04/07/19  2053  TROPONINIHS 3 3      Cardiac EnzymesNo results for input(s): TROPONINI in the last 168 hours. No results for input(s): TROPIPOC in the last 168 hours.   Chemistry Recent Labs  Lab 04/07/19 1917  NA 135  K 3.9  CL 103  CO2 22  GLUCOSE 127*  BUN 16  CREATININE 1.56*  CALCIUM 8.9  PROT 6.7  ALBUMIN 3.9  AST 19  ALT 13  ALKPHOS 84  BILITOT 0.6  GFRNONAA 40*  GFRAA 46*  ANIONGAP 10     Hematology Recent Labs  Lab 04/07/19 1917 04/08/19 0358  WBC 6.7 6.7  RBC 4.37 4.21*  HGB 14.4 13.8  HCT 42.6 42.0  MCV 97.5 99.8  MCH 33.0 32.8  MCHC 33.8 32.9  RDW 13.4 13.6  PLT 188 197    BNPNo results for input(s): BNP, PROBNP in the last 168 hours.   DDimer No results for input(s): DDIMER in the last 168 hours.   Radiology    No results found.  Cardiac Studies   Nuclear stress test 2017 overall low risk no ischemia. Echocardiogram today pending.  Patient Profile     85 y.o. male admitted with shortness of breath thought to be possible unstable  angina  Assessment & Plan    Dyspnea on exertion/chest pain - Improved.    Prior stress test showed no evidence of ischemia in 2017.  Had a brief episode this morning of sharp fleeting discomfort. -High-sensitivity troponins have been normal.  EKG normal.  No evidence of ischemia. - Echocardiogram shows normal ejection fraction.  Reassuring.  Preliminary view.  Reasonable study, discharge and set up with outpatient nuclear stress test.  Transient ventricular bigeminy - Brief episodes noted on telemetry.  This decreases effective pulse rate.  He was asymptomatic with this earlier.  If this correlated with some of his shortness of breath or exertional fatigue, one could consider trying to extinguish his PVCs.  For now, I will continue with his low-dose Toprol 25 mg a day given his prior atrial fibrillation ablation.  Persistent atrial fibrillation - Status post ablation, currently in sinus rhythm. - Eliquis 5 mg  twice a day.  Restart.  Hypertension -Amlodipine 2.5 and Toprol 25.  Neuropathy -He states that he thinks the gabapentin is wiping him out.  Continue to discuss with his primary doctor.  Hyperlipidemia -Continue with pravastatin.  For questions or updates, please contact [place] Please consult [email or web address] for contact info under        Signed, Candee Furbish, MD  04/08/2019, 8:56 AM

## 2019-04-08 NOTE — Discharge Summary (Addendum)
Discharge Summary    Patient ID: [name] MRN: [id number]; DOB: 09-19-1931  Admit date: 04/07/2019 Discharge date: 04/08/2019  Primary Care Provider: [name], MD  Primary Cardiologist: Fransico Him, MD  Primary Electrophysiologist:  None   Discharge Diagnoses    Principal Problem:   Unstable angina [facility]) Active Problems:   Hyperlipidemia   Essential hypertension   ATRIAL FIBRILLATION   Ventricular bigeminy   Allergies Allergies  Allergen Reactions  . Amoxicillin-Pot Clavulanate Diarrhea and Nausea Only  . Losartan Other (See Comments) and Nausea And Vomiting    Elevated creatinine levels Elevated creatinine levels    Diagnostic Studies/Procedures    2D echo 04/08/2019  1. The left ventricle has normal systolic function, with an ejection fraction of 55-60%. The cavity size was normal. Left ventricular diastolic Doppler parameters are consistent with pseudonormalization. No evidence of left ventricular regional wall  motion abnormalities.  2. The right ventricle has normal systolic function. The cavity was normal. There is no increase in right ventricular wall thickness.  3. The aortic valve is tricuspid.  4. The aorta is normal unless otherwise noted. _____________   History of Present Illness     Patient is a 85 y.o.malewith a hx  ofpersistentatrial fibrillations/p afib ablation at [facility] x 2 in 2010, HTN and hyperlipidemia. He has a hx of very atypical chest pain and nuclear stress test was low risk with no ischemia. He also has a hx of PACs and PVCs. Patient had a televisit with Dr. Radford Pax 8/13 for regular follow-up. Patient was having significant chest pain that had been intermittent for the last year, but had recently become more severe. The pain had been occurring for the last couple days. Pain was nonexertional and not associated with diaphoresis or N/V. He was having worsening sob with extreme exertional fatigue as well. Patient was unable to  walk to the mailbox due to sob. He was also having orthopnea for the last couple nights awakening the patient from sleep. He did also notice increasing lower leg edema. He denied palpitations or syncope. Home meds included Amlodipine 2.5mg  daily, Eliquis 5mg  BID, Lasix 20 mg PRN, Toprol 25mg  daily, and Lovastatin 20 mg daily. Due to the unstable angina patient was admitted to the hospital on telemetry for cath vs stress test.   Hospital Course     Consultants: None  On arrival patient was COVID negative. Eliquis was held and patient was placed on IV heparin for possible cardiac cath. BB and statin were continued.  Patient was given ASA 81 mg due to possible ACS. Labs showed electrolytes wnl, glucose 127, A1C 5.7, Creatinine slightly elevated at 1.56 (baseline around 1.4). HS troponin was negative, 3 > 3. WBC 6.7, Hgb 14.4. TSH 3.02. EKG showed NSR, 64 bpm and no new acute changes. Overnight patient did not have any problems. In the morning patient did feel some atypical fleeting chest discomfort. EKG was unchanged from previous. A 2D echo cardiogram was performed which showed LVEF 55-60% with moderate diastolic dysfunction. Blood pressure remained controlled. Lipid panel was drawn and LDL 87. Will continue home meds at discharge. Will plan for outpatient nuclear stress test and follow-up with Dr. Radford Pax.   Patient was seen and examine by Dr. Marlou Porch on 04/08/2019 and felt to be stable for discharge. Outpatient follow-up arranged. Medications as below.  _____________  Discharge Vitals Blood pressure 132/65, pulse (!) 56, temperature (!) 97.5 F (36.4 C), temperature source Oral, resp. rate 15, height 6\' 2"  (1.88 m), weight  85.8 kg, SpO2 96 %.  Filed Weights   04/07/19 1833 04/08/19 0553  Weight: 87.1 kg 85.8 kg    Labs & Radiologic Studies    CBC Recent Labs    04/07/19 1917 04/08/19 0358  WBC 6.7 6.7  NEUTROABS 3.6  --   HGB 14.4 13.8  HCT 42.6 42.0  MCV 97.5 99.8  PLT 188 170   Basic  Metabolic Panel Recent Labs    04/07/19 1917  NA 135  K 3.9  CL 103  CO2 22  GLUCOSE 127*  BUN 16  CREATININE 1.56*  CALCIUM 8.9  MG 1.9   Liver Function Tests Recent Labs    04/07/19 1917  AST 19  ALT 13  ALKPHOS 84  BILITOT 0.6  PROT 6.7  ALBUMIN 3.9   No results for input(s): LIPASE, AMYLASE in the last 72 hours. Cardiac Enzymes No results for input(s): CKTOTAL, CKMB, CKMBINDEX, TROPONINI in the last 72 hours. BNP Invalid input(s): POCBNP D-Dimer No results for input(s): DDIMER in the last 72 hours. Hemoglobin A1C Recent Labs    04/07/19 1917  HGBA1C 5.7*   Fasting Lipid Panel Recent Labs    04/08/19 0358  CHOL 128  HDL 28*  LDLCALC 87  TRIG 66  CHOLHDL 4.6   Thyroid Function Tests No results for input(s): TSH, T4TOTAL, T3FREE, THYROIDAB in the last 72 hours.  Invalid input(s): FREET3 _____________  No results found. Disposition   Pt is being discharged home today in good condition.  Follow-up Plans & Appointments    Follow-up Information    [name], PA-C Follow up on 05/06/2019.   Specialties: Cardiology, Radiology Why: Appointment time at 9:45 am Contact information: [address] [place] [address] [phone]          Discharge Instructions    Diet - low sodium heart healthy   Complete by: As directed    Increase activity slowly   Complete by: As directed       Discharge Medications   Allergies as of 04/08/2019      Reactions   Amoxicillin-pot Clavulanate Diarrhea, Nausea Only   Losartan Other (See Comments), Nausea And Vomiting   Elevated creatinine levels Elevated creatinine levels      Medication List    TAKE these medications   acetaminophen 500 MG tablet Commonly known as: TYLENOL Take 500 mg by mouth every 6 (six) hours as needed for mild pain.   amLODipine 2.5 MG tablet Commonly known as: NORVASC TAKE 1 TABLET BY MOUTH ONCE DAILY   apixaban 5 MG Tabs tablet Commonly known  as: Eliquis Take 1 tablet (5 mg total) by mouth 2 (two) times daily.   clobetasol ointment 0.05 % Commonly known as: TEMOVATE Apply 1 application topically 2 (two) times daily as needed (Rash).   furosemide 20 MG tablet Commonly known as: LASIX Take 1 tablet (20 mg total) by mouth daily as needed for fluid.   gabapentin 100 MG capsule Commonly known as: NEURONTIN TAKE 1 CAPSULE BY MOUTH IN  THE MORNING AND 2 CAPSULES  AT BEDTIME What changed: See the new instructions.   lansoprazole 15 MG capsule Commonly known as: PREVACID Take 15 mg by mouth daily at 12 noon.   lovastatin 20 MG tablet Commonly known as: MEVACOR Take 1 tablet (20 mg total) by mouth at bedtime.   metoprolol succinate 25 MG 24 hr tablet Commonly known as: TOPROL-XL TAKE 1 TABLET BY MOUTH  DAILY   metroNIDAZOLE 0.75 %  cream Commonly known as: METROCREAM APPLY TOPICALLY 3 TIMES A  WEEK ON THE DAYS HE SHAVES What changed: See the new instructions.   nitroGLYCERIN 0.4 MG SL tablet Commonly known as: NITROSTAT Place 1 tablet (0.4 mg total) under the tongue every 5 (five) minutes x 3 doses as needed for chest pain.   tamsulosin 0.4 MG Caps capsule Commonly known as: FLOMAX TAKE 1 CAPSULE BY MOUTH  DAILY   vitamin B-12 500 MCG tablet Commonly known as: CYANOCOBALAMIN Take 500 mcg by mouth daily.   VITAMIN D PO Take 1 capsule by mouth daily.        Acute coronary syndrome (MI, NSTEMI, STEMI, etc) this admission?: No.    Outstanding Labs/Studies   Nuclear Stress test  Duration of Discharge Encounter   Greater than 30 minutes including physician time.  Signed, Cadence Ninfa Meeker, PA-C 04/08/2019, 1:53 PM  Personally seen and examined. Agree with above.   Dr. Radford Pax  Subjective   Feeling better.  No chest pain.  Came in after feeling more short of breath with activity.  Minimal sharp chest discomfort fleeting this morning.  Atypical.  Inpatient Medications    Scheduled Meds: .  amLODipine  2.5 mg Oral Daily  . aspirin EC  81 mg Oral Daily  . Chlorhexidine Gluconate Cloth  6 each Topical Q0600  . gabapentin  100 mg Oral Daily  . gabapentin  200 mg Oral QHS  . metoprolol succinate  25 mg Oral Daily  . pantoprazole  20 mg Oral Q1200  . pravastatin  20 mg Oral q1800  . tamsulosin  0.4 mg Oral QHS   Continuous Infusions: . heparin 900 Units/hr (04/08/19 0549)   PRN Meds: acetaminophen, nitroGLYCERIN, ondansetron (ZOFRAN) IV   Vital Signs          Vitals:   04/07/19 1939 04/07/19 2331 04/08/19 0553 04/08/19 0728  BP: (!) 154/68 (!) 137/56 125/74   Pulse: 61 (!) 59 63   Resp: 14 16 15    Temp: 97.6 F (36.4 C) 97.7 F (36.5 C) 97.7 F (36.5 C) 97.7 F (36.5 C)  TempSrc: Oral Oral Oral Oral  SpO2: 97% 98% 97% 97%  Weight:   85.8 kg   Height:        Intake/Output Summary (Last 24 hours) at 04/08/2019 0856 Last data filed at 04/08/2019 0549 Gross per 24 hour  Intake 306.8 ml  Output 550 ml  Net -243.2 ml   Last 3 Weights 04/08/2019 04/07/2019 04/07/2019  Weight (lbs) 189 lb 1.6 oz 192 lb 191 lb  Weight (kg) 85.775 kg 87.091 kg 86.637 kg      Telemetry    No adverse arrhythmias- Personally Reviewed  ECG    Normal EKG- Personally Reviewed  Physical Exam  Elderly GEN:No acute distress.   Neck:No JVD Cardiac:RRR, no murmurs, rubs, or gallops.  Respiratory:Clear to auscultation bilaterally. JJ:KKXF, nontender, non-distended  MS:No edema; No deformity. Neuro:Nonfocal  Psych: Normal affect   Labs    High Sensitivity Troponin:   Last Labs       Recent Labs  Lab 04/07/19 1917 04/07/19 2053  TROPONINIHS 3 3        Cardiac Enzymes Last Labs   No results for input(s): TROPONINI in the last 168 hours.    Last Labs   No results for input(s): TROPIPOC in the last 168 hours.     Chemistry Last Labs      Recent Labs  Lab 04/07/19 1917  NA 135  K 3.9  CL 103  CO2 22  GLUCOSE 127*  BUN 16  CREATININE  1.56*  CALCIUM 8.9  PROT 6.7  ALBUMIN 3.9  AST 19  ALT 13  ALKPHOS 84  BILITOT 0.6  GFRNONAA 40*  GFRAA 46*  ANIONGAP 10       Hematology Last Labs       Recent Labs  Lab 04/07/19 1917 04/08/19 0358  WBC 6.7 6.7  RBC 4.37 4.21*  HGB 14.4 13.8  HCT 42.6 42.0  MCV 97.5 99.8  MCH 33.0 32.8  MCHC 33.8 32.9  RDW 13.4 13.6  PLT 188 197      BNP Last Labs   No results for input(s): BNP, PROBNP in the last 168 hours.     DDimer  Last Labs   No results for input(s): DDIMER in the last 168 hours.     Radiology    Imaging Results (Last 48 hours)  No results found.    Cardiac Studies   Nuclear stress test 2017 overall low risk no ischemia. Echocardiogram today pending.  Patient Profile     85 y.o. male admitted with shortness of breath thought to be possible unstable angina  Assessment & Plan    Dyspnea on exertion/chest pain - Improved.    Prior stress test showed no evidence of ischemia in 2017.  Had a brief episode this morning of sharp fleeting discomfort. -High-sensitivity troponins have been normal.  EKG normal.  No evidence of ischemia. - Echocardiogram shows normal ejection fraction.  Reassuring.  Preliminary view.  Reasonable study, discharge and set up with outpatient nuclear stress test.  Transient ventricular bigeminy - Brief episodes noted on telemetry.  This decreases effective pulse rate.  He was asymptomatic with this earlier.  If this correlated with some of his shortness of breath or exertional fatigue, one could consider trying to extinguish his PVCs.  For now, I will continue with his low-dose Toprol 25 mg a day given his prior atrial fibrillation ablation.  Persistent atrial fibrillation - Status post ablation, currently in sinus rhythm. - Eliquis 5 mg twice a day.  Restart.  Hypertension -Amlodipine 2.5 and Toprol 25.  Neuropathy -He states that he thinks the gabapentin is wiping him out.  Continue to discuss with his  primary doctor.  Hyperlipidemia -Continue with pravastatin.  For questions or updates, please contact [facility] Please consult [email or web address] for contact info under        Signed, Candee Furbish, MD

## 2019-04-08 NOTE — Discharge Instructions (Signed)
Our office will call you to schedule an outpatient stress test within the next 1-2 weeks. If you don't hear from our office bye Tuesday, please call the office to schedule this.

## 2019-04-08 NOTE — Progress Notes (Signed)
  Echocardiogram 2D Echocardiogram has been performed.  [name] 04/08/2019, 9:40 AM

## 2019-04-08 NOTE — Progress Notes (Signed)
Patient and wife provided verbal discharge instructions at bedside.  Patient given copy of AVS. Patient VSS at d/c. No further questions from patient or wife. IV removed per orders. Patient belongings sent with patient. RN discharge patient via wheelchair through N tower entrance to private vehicle.

## 2019-04-11 ENCOUNTER — Other Ambulatory Visit: Payer: Self-pay | Admitting: Family Medicine

## 2019-04-12 NOTE — Telephone Encounter (Signed)
[place] patient.  

## 2019-04-14 ENCOUNTER — Telehealth (HOSPITAL_COMMUNITY): Payer: Self-pay

## 2019-04-14 NOTE — Telephone Encounter (Signed)
Encounter complete. 

## 2019-04-15 ENCOUNTER — Other Ambulatory Visit: Payer: Self-pay

## 2019-04-15 ENCOUNTER — Ambulatory Visit (HOSPITAL_COMMUNITY)
Admission: RE | Admit: 2019-04-15 | Discharge: 2019-04-15 | Disposition: A | Discharge status: Home / Self Care | Payer: Medicare Other | Source: Ambulatory Visit | Attending: Medical | Admitting: Medical

## 2019-04-15 DIAGNOSIS — I200 Unstable angina: Secondary | ICD-10-CM | POA: Diagnosis not present

## 2019-04-15 MED ORDER — TECHNETIUM TC 99M TETROFOSMIN IV KIT
30.1000 | PACK | Freq: Once | INTRAVENOUS | Status: AC | PRN
  Administered 2019-04-15: 30.1 via INTRAVENOUS
  Filled 2019-04-15: qty 31

## 2019-04-15 MED ORDER — REGADENOSON 0.4 MG/5ML IV SOLN
0.4000 mg | Freq: Once | INTRAVENOUS | Status: AC
  Administered 2019-04-15: 0.4 mg via INTRAVENOUS

## 2019-04-15 MED ORDER — TECHNETIUM TC 99M TETROFOSMIN IV KIT
10.5000 | PACK | Freq: Once | INTRAVENOUS | Status: AC | PRN
  Administered 2019-04-15: 10.5 via INTRAVENOUS
  Filled 2019-04-15: qty 11

## 2019-04-17 LAB — MYOCARDIAL PERFUSION IMAGING
LV dias vol: 100 mL (ref 62–150)
LV sys vol: 45 mL
Peak HR: 74 {beats}/min
Rest HR: 63 {beats}/min
SDS: 5
SRS: 2
SSS: 6
TID: 1.17

## 2019-04-19 ENCOUNTER — Telehealth: Payer: Self-pay | Admitting: *Deleted

## 2019-04-19 NOTE — Telephone Encounter (Signed)
Flu vaccine OK to get now

## 2019-04-19 NOTE — Telephone Encounter (Signed)
Copied from [place] [phone]. Topic: General - Other >> Apr 19, 2019 10:15 AM Celene Kras A wrote: Reason for CRM: Pts wife called and they are looking for advisement on wether they should get their flu shot now or not. Pts wife is also wanting pt to have shingles shot if it is needed. Please advise.

## 2019-04-19 NOTE — Telephone Encounter (Signed)
Please see message and advise. I will advise that shingles will have to be given at pharmacy.

## 2019-04-20 NOTE — Telephone Encounter (Signed)
Flu vaccine scheduled.

## 2019-04-25 ENCOUNTER — Ambulatory Visit: Payer: Medicare Other | Admitting: Family Medicine

## 2019-05-03 ENCOUNTER — Telehealth: Payer: Self-pay | Admitting: Physician Assistant

## 2019-05-03 ENCOUNTER — Other Ambulatory Visit: Payer: Self-pay

## 2019-05-03 ENCOUNTER — Ambulatory Visit (INDEPENDENT_AMBULATORY_CARE_PROVIDER_SITE_OTHER): Payer: Medicare Other | Admitting: Family Medicine

## 2019-05-03 DIAGNOSIS — Z23 Encounter for immunization: Secondary | ICD-10-CM

## 2019-05-03 NOTE — Progress Notes (Signed)
Gave patient flu vaccine in right deltoid and patient tolerated well.

## 2019-05-03 NOTE — Telephone Encounter (Signed)
Patient's wife called she would like to come with him to his appt on 9/11, as he has issues with memory.

## 2019-05-03 NOTE — Telephone Encounter (Signed)
Returned [employer], DPR on file. It is ok for her to accompany the pt to his appt.

## 2019-05-05 ENCOUNTER — Encounter: Payer: Self-pay | Admitting: Physician Assistant

## 2019-05-05 NOTE — Progress Notes (Addendum)
Cardiology Office Note    Date:  05/06/2019   ID:  [name] Jul 15, 1932, MRN [id number]  PCP:  [name], MD  Cardiologist:  Fransico Him, MD  Electrophysiologist:  None   Chief Complaint: f/u hospitalization for chest pain  History of Present Illness:   [name] is a 85 y.o. male with history of persistentatrial fibrillations/p afib ablation at [facility] x 2 in 2010, HTN, hyperlipidemia, PACs/PVCs (by monitor 2017), CKD stage III, memory difficulty who presents for post-hospital f/u. He has a history of prior atypical chest pain and negative stress test. Patient had a televisit with Dr. Radford Pax 8/13 for regular follow-up. Patient was having significant chest pain that had been intermittent for the last year, but had recently become more severe. Due to concern for unstable angina he was admitted to the hospital. Troponins were negative. 2D echo 04/08/19 showed EF 55-60%, no significant abnormalities. Outpatient nuc was arranged 04/15/19 which was normal, EF 55%. Labs otherwise 03/2019 showed Hgb 13.8, LDL 87, A1C 5.7, Hgb 14.4, LFTs OK, Cr 1.56 (previous 1.3-1.4).  He returns for follow-up with his wife in good spirits. Since the hospitalization he's only had one episode of pain that he remembers, while seated his chair, a vague sharp pain across his chest without associated symptoms or triggers. He took a SL NTG and symptoms eased off but it took a while. He has had some belching recently and wonders if this is indigestion. He has been on Prevacid for a long time. His wife says he's reported other fleeting pains but he does not recall this. He denies any chest pain with ambulation and has been able to walk to the mailbox without eliciting discomfort. Overall he actually feels like he's doing well. Regarding his dyspnea on exertion, he still notes this is present. His wife reports he has had significant back problems worsening over the last year so his activity level has gone  downhill significantly which is likely contributing. They feel the back pain contributes somewhat to his dyspnea. No orthopnea or edema. No bleeding reported.    Past Medical History:  Diagnosis Date   Actinic keratosis 03/13/2009   Arthritis    BENIGN PROSTATIC HYPERTROPHY, HX OF 04/07/2009   CARCINOMA, SKIN, SQUAMOUS CELL 09/11/2009   COLONIC POLYPS, HX OF 03/13/2009   Essential hypertension    GERD 03/13/2009   Hyperlipidemia    INSOMNIA, CHRONIC 09/11/2009   Persistent atrial fibrillation 04/07/2009   a. s/p afib ablation at [facility] x 2 in 2010.   POLYPECTOMY, HX OF 04/07/2009   Premature atrial contractions    PULMONARY NODULE 09/11/2009   PVC's (premature ventricular contractions)    Rosacea 03/13/2009   SKIN CANCER, HX OF 03/13/2009    Past Surgical History:  Procedure Laterality Date   ABLATION  2010   x2   COLONOSCOPY W/ BIOPSIES AND POLYPECTOMY     EYE SURGERY Bilateral    cataracts   LUMBAR DISC SURGERY     RUPTURE   LUMBAR LAMINECTOMY/DECOMPRESSION MICRODISCECTOMY N/A 04/20/2014   Procedure: LUMBAR TWO-THREE, LUMBAR THREE-FOUR, LUMAR FOUR-FIVE LAMINECTOMIES;  Surgeon: Newman Pies, MD;  Location: [facility];  Service: Neurosurgery;  Laterality: N/A;   POLYPECTOMY     SKIN CANCER EXCISION      Current Medications: Current Meds  Medication Sig   acetaminophen (TYLENOL) 500 MG tablet Take 500 mg by mouth every 6 (six) hours as needed for mild pain.   amLODipine (NORVASC) 2.5 MG tablet TAKE 1  TABLET BY MOUTH ONCE DAILY   apixaban (ELIQUIS) 5 MG TABS tablet Take 1 tablet (5 mg total) by mouth 2 (two) times daily.   clobetasol ointment (TEMOVATE) [id number] % Apply 1 application topically 2 (two) times daily as needed (Rash).    furosemide (LASIX) 20 MG tablet Take 1 tablet (20 mg total) by mouth daily as needed for fluid.   gabapentin (NEURONTIN) 100 MG capsule TAKE 1 CAPSULE BY MOUTH IN  THE MORNING AND 2 CAPSULES  AT BEDTIME   lovastatin  (MEVACOR) 20 MG tablet TAKE 1 TABLET BY MOUTH AT  BEDTIME   metoprolol succinate (TOPROL-XL) 25 MG 24 hr tablet TAKE 1 TABLET BY MOUTH  DAILY (Patient taking differently: Take 12.5 mg by mouth daily. )   metroNIDAZOLE (METROCREAM) 0.75 % cream APPLY TOPICALLY 3 TIMES A  WEEK ON THE DAYS HE SHAVES   nitroGLYCERIN (NITROSTAT) 0.4 MG SL tablet Place 1 tablet (0.4 mg total) under the tongue every 5 (five) minutes x 3 doses as needed for chest pain.   tamsulosin (FLOMAX) 0.4 MG CAPS capsule TAKE 1 CAPSULE BY MOUTH  ONCE DAILY   vitamin B-12 (CYANOCOBALAMIN) 500 MCG tablet Take 500 mcg by mouth daily.   VITAMIN D PO Take 1 capsule by mouth daily.   [DISCONTINUED] lansoprazole (PREVACID) 15 MG capsule Take 15 mg by mouth daily at 12 noon.     Allergies:   Amoxicillin-pot clavulanate and Losartan   Social History   Socioeconomic History   Marital status: Married    Spouse name: Not on file   Number of children: Not on file   Years of education: Not on file   Highest education level: Not on file  Occupational History   Not on file  Social Needs   Financial resource strain: Not on file   Food insecurity    Worry: Not on file    Inability: Not on file   Transportation needs    Medical: Not on file    Non-medical: Not on file  Tobacco Use   Smoking status: Former Smoker    Packs/day: 1.00    Years: 30.00    Pack years: 30.00    Types: Cigarettes    Quit date: 03/13/1988    Years since quitting: 31.1   Smokeless tobacco: Current User    Types: Chew  Substance and Sexual Activity   Alcohol use: No   Drug use: No   Sexual activity: Not on file  Lifestyle   Physical activity    Days per week: Not on file    Minutes per session: Not on file   Stress: Not on file  Relationships   Social connections    Talks on phone: Not on file    Gets together: Not on file    Attends religious service: Not on file    Active member of club or organization: Not on file     Attends meetings of clubs or organizations: Not on file    Relationship status: Not on file  Other Topics Concern   Not on file  Social History Narrative   Not on file     Family History:  The patient's family history includes CAD in his brother, brother, and sister; Cancer in his mother; Cancer (age of onset: 70) in his father; Heart attack in his father; Heart disease in his brother, brother, father, and sister.  ROS:   Please see the history of present illness.  No palpitations. All other systems are reviewed and  otherwise negative.    EKGs/Labs/Other Studies Reviewed:    Studies reviewed were summarized above.   EKG:  EKG is ordered today, personally reviewed, demonstrating NSR 69bpm, nonspecific TW changes I, avL, otherwise nonacute from prior  Recent Labs: 03/16/2019: TSH 3.02 04/07/2019: ALT 13; BUN 16; Creatinine, Ser 1.56; Magnesium 1.9; Potassium 3.9; Sodium 135 04/08/2019: Hemoglobin 13.8; Platelets 197  Recent Lipid Panel    Component Value Date/Time   CHOL 128 04/08/2019 0358   TRIG 66 04/08/2019 0358   HDL 28 (L) 04/08/2019 0358   CHOLHDL 4.6 04/08/2019 0358   VLDL 13 04/08/2019 0358   LDLCALC 87 04/08/2019 0358   LDLDIRECT 79.3 09/30/2013 1038    PHYSICAL EXAM:    VS:  BP 124/60    Pulse 69    Ht 6\' 2"  (1.88 m)    Wt 197 lb 1.9 oz (89.4 kg)    SpO2 95%    BMI 25.31 kg/m   BMI: Body mass index is 25.31 kg/m.  GEN: Well nourished, well developed WM, in no acute distress HEENT: normocephalic, atraumatic Neck: no JVD, carotid bruits, or masses Cardiac: RRR; no murmurs, rubs, or gallops, no edema  Respiratory:  clear to auscultation bilaterally, normal work of breathing GI: soft, nontender, nondistended, + BS MS: no deformity or atrophy Skin: warm and dry, no rash Neuro:  Alert and Oriented x 3, Strength and sensation are intact, follows commands Psych: euthymic mood, full affect  Wt Readings from Last 3 Encounters:  05/06/19 197 lb 1.9 oz (89.4 kg)    04/15/19 189 lb (85.7 kg)  04/08/19 189 lb 1.6 oz (85.8 kg)     ASSESSMENT & PLAN:   1. Atypical chest pain - has long history of atypical chest pain in the past with negative nuc in 2017. During recent hospitalization hsTroponins were negative arguing against ischemia. 2D echo and nuclear stress test also reassuring and argue against cardiac cause of his symptoms. In absence of clear evidence of ischemia, favor continued medical therapy. Will titrate his PPI to 30mg  daily and ask him to f/u with his PCP. Regarding his dyspnea, suspect this is due to deconditioning in setting of back issues which have kept him from doing very much in the last few months. Deconditioning is felt to be playing a role. Addendum: discussed plan with Dr. Radford Pax who agrees. 2. Persistent atrial fibrillation - maintaining NSR. Cr was elevated in the hospital. Recheck today as he might require dose adjustment for Eliquis. 3. Essential HTN - well controlled. 4. Hyperlipidemia - lipid profile satisfactory in 03/2019.  Disposition: F/u with Dr. Radford Pax in 6 months. Will ask her to review plan and make sure she is in agreement.  Medication Adjustments/Labs and Tests Ordered: Current medicines are reviewed at length with the patient today.  Concerns regarding medicines are outlined above. Medication changes, Labs and Tests ordered today are summarized above and listed in the Patient Instructions accessible in Encounters.   Signed, [name], PA-C  05/06/2019 10:08 AM    [facility] [address], [place], [place]  [address] Phone: [phone]; Fax: [phone]

## 2019-05-06 ENCOUNTER — Encounter: Payer: Self-pay | Admitting: Physician Assistant

## 2019-05-06 ENCOUNTER — Other Ambulatory Visit: Payer: Self-pay

## 2019-05-06 ENCOUNTER — Ambulatory Visit (INDEPENDENT_AMBULATORY_CARE_PROVIDER_SITE_OTHER): Payer: Medicare Other | Admitting: Physician Assistant

## 2019-05-06 VITALS — BP 124/60 | HR 69 | Ht 74.0 in | Wt 197.1 lb

## 2019-05-06 DIAGNOSIS — E785 Hyperlipidemia, unspecified: Secondary | ICD-10-CM

## 2019-05-06 DIAGNOSIS — R0789 Other chest pain: Secondary | ICD-10-CM

## 2019-05-06 DIAGNOSIS — I10 Essential (primary) hypertension: Secondary | ICD-10-CM

## 2019-05-06 DIAGNOSIS — I200 Unstable angina: Secondary | ICD-10-CM | POA: Diagnosis not present

## 2019-05-06 DIAGNOSIS — I4821 Permanent atrial fibrillation: Secondary | ICD-10-CM | POA: Diagnosis not present

## 2019-05-06 LAB — BASIC METABOLIC PANEL
BUN/Creatinine Ratio: 11 (ref 10–24)
BUN: 17 mg/dL (ref 8–27)
CO2: 23 mmol/L (ref 20–29)
Calcium: 8.9 mg/dL (ref 8.6–10.2)
Chloride: 103 mmol/L (ref 96–106)
Creatinine, Ser: 1.5 mg/dL — ABNORMAL HIGH (ref 0.76–1.27)
GFR calc Af Amer: 48 mL/min/{1.73_m2} — ABNORMAL LOW (ref >59)
GFR calc non Af Amer: 42 mL/min/{1.73_m2} — ABNORMAL LOW (ref >59)
Glucose: 127 mg/dL — ABNORMAL HIGH (ref 65–99)
Potassium: 4.6 mmol/L (ref 3.5–5.2)
Sodium: 138 mmol/L (ref 134–144)

## 2019-05-06 MED ORDER — LANSOPRAZOLE 30 MG PO CPDR: 30.0000 mg | DELAYED_RELEASE_CAPSULE | Freq: Every day | ORAL | 1 refills | Status: DC

## 2019-05-06 NOTE — Patient Instructions (Signed)
Medication Instructions:  Your physician has recommended you make the following change in your medication:  1.  INCREASE the Prevacid to 30 mg taking 1 tablet daily  If you need a refill on your cardiac medications before your next appointment, please call your pharmacy.   Lab work: TODAY:  BMET  If you have labs (blood work) drawn today and your tests are completely normal, you will receive your results only by: Marland Kitchen MyChart Message (if you have MyChart) OR . A paper copy in the mail If you have any lab test that is abnormal or we need to change your treatment, we will call you to review the results.  Testing/Procedures: None ordered  Follow-Up: At [facility], you and your health needs are our priority.  As part of our continuing mission to provide you with exceptional heart care, we have created designated Provider Care Teams.  These Care Teams include your primary Cardiologist (physician) and Advanced Practice Providers (APPs -  Physician Assistants and Nurse Practitioners) who all work together to provide you with the care you need, when you need it. You will need a follow up appointment in 6 months.  Please call our office 2 months in advance to schedule this appointment.  You may see Fransico Him, MD or one of the following Advanced Practice Providers on your designated Care Team:   [place], PA-C Melina Copa, PA-C . Ermalinda Barrios, PA-C  Any Other Special Instructions Will Be Listed Below (If Applicable).

## 2019-05-09 ENCOUNTER — Telehealth: Payer: Self-pay | Admitting: *Deleted

## 2019-05-09 DIAGNOSIS — Z79899 Other long term (current) drug therapy: Secondary | ICD-10-CM

## 2019-05-09 NOTE — Telephone Encounter (Signed)
-----   Message from [name], [place] sent at 05/09/2019  8:43 AM EDT ----- Anderson Malta, please find out how often pt is taking furosemide/Lasix (or if at all). This will help [place] decide how to interpret his labs.

## 2019-05-09 NOTE — Telephone Encounter (Signed)
Call placed to pt re: lab results, left a message for pt to call back.  

## 2019-05-11 ENCOUNTER — Ambulatory Visit (INDEPENDENT_AMBULATORY_CARE_PROVIDER_SITE_OTHER): Payer: Medicare Other | Admitting: Family Medicine

## 2019-05-11 ENCOUNTER — Other Ambulatory Visit: Payer: Self-pay

## 2019-05-11 ENCOUNTER — Encounter: Payer: Self-pay | Admitting: Family Medicine

## 2019-05-11 DIAGNOSIS — R0789 Other chest pain: Secondary | ICD-10-CM | POA: Diagnosis not present

## 2019-05-11 DIAGNOSIS — R143 Flatulence: Secondary | ICD-10-CM | POA: Diagnosis not present

## 2019-05-11 DIAGNOSIS — R142 Eructation: Secondary | ICD-10-CM | POA: Diagnosis not present

## 2019-05-11 DIAGNOSIS — R198 Other specified symptoms and signs involving the digestive system and abdomen: Secondary | ICD-10-CM | POA: Diagnosis not present

## 2019-05-11 NOTE — Patient Instructions (Signed)
Low-FODMAP Eating Plan  FODMAPs (fermentable oligosaccharides, disaccharides, monosaccharides, and polyols) are sugars that are hard for some people to digest. A low-FODMAP eating plan may help some people who have bowel (intestinal) diseases to manage their symptoms. This meal plan can be complicated to follow. Work with a diet and nutrition specialist (dietitian) to make a low-FODMAP eating plan that is right for you. A dietitian can make sure that you get enough nutrition from this diet. What are tips for following this plan? Reading food labels  Check labels for hidden FODMAPs such as: ? High-fructose syrup. ? Honey. ? Agave. ? Natural fruit flavors. ? Onion or garlic powder.  Choose low-FODMAP foods that contain 3-4 grams of fiber per serving.  Check food labels for serving sizes. Eat only one serving at a time to make sure FODMAP levels stay low. Meal planning  Follow a low-FODMAP eating plan for up to 6 weeks, or as told by your health care provider or dietitian.  To follow the eating plan: 1. Eliminate high-FODMAP foods from your diet completely. 2. Gradually reintroduce high-FODMAP foods into your diet one at a time. Most people should wait a few days after introducing one high-FODMAP food before they introduce the next high-FODMAP food. Your dietitian can recommend how quickly you may reintroduce foods. 3. Keep a daily record of what you eat and drink, and make note of any symptoms that you have after eating. 4. Review your daily record with a dietitian regularly. Your dietitian can help you identify which foods you can eat and which foods you should avoid. General tips  Drink enough fluid each day to keep your urine pale yellow.  Avoid processed foods. These often have added sugar and may be high in FODMAPs.  Avoid most dairy products, whole grains, and sweeteners.  Work with a dietitian to make sure you get enough fiber in your diet. Recommended foods Grains   Gluten-free grains, such as rice, oats, buckwheat, quinoa, corn, polenta, and millet. Gluten-free pasta, bread, or cereal. Rice noodles. Corn tortillas. Vegetables  Eggplant, zucchini, cucumber, peppers, green beans, Brussels sprouts, bean sprouts, lettuce, arugula, kale, Swiss chard, spinach, collard greens, bok choy, summer squash, potato, and tomato. Limited amounts of corn, carrot, and sweet potato. Green parts of scallions. Fruits  Bananas, oranges, lemons, limes, blueberries, raspberries, strawberries, grapes, cantaloupe, honeydew melon, kiwi, papaya, passion fruit, and pineapple. Limited amounts of dried cranberries, banana chips, and shredded coconut. Dairy  Lactose-free milk, yogurt, and kefir. Lactose-free cottage cheese and ice cream. Non-dairy milks, such as almond, coconut, hemp, and rice milk. Yogurts made of non-dairy milks. Limited amounts of goat cheese, brie, mozzarella, parmesan, swiss, and other hard cheeses. Meats and other protein foods  Unseasoned beef, pork, poultry, or fish. Eggs. Bacon. Tofu (firm) and tempeh. Limited amounts of nuts and seeds, such as almonds, walnuts, brazil nuts, pecans, peanuts, pumpkin seeds, chia seeds, and sunflower seeds. Fats and oils  Butter-free spreads. Vegetable oils, such as olive, canola, and sunflower oil. Seasoning and other foods  Artificial sweeteners with names that do not end in "ol" such as aspartame, saccharine, and stevia. Maple syrup, white table sugar, raw sugar, brown sugar, and molasses. Fresh basil, coriander, parsley, rosemary, and thyme. Beverages  Water and mineral water. Sugar-sweetened soft drinks. Small amounts of orange juice or cranberry juice. Black and green tea. Most dry wines. Coffee. This may not be a complete list of low-FODMAP foods. Talk with your dietitian for more information. Foods to avoid Grains  Wheat,   including kamut, durum, and semolina. Barley and bulgur. Couscous. Wheat-based cereals. Wheat  noodles, bread, crackers, and pastries. Vegetables  Chicory root, artichoke, asparagus, cabbage, snow peas, sugar snap peas, mushrooms, and cauliflower. Onions, garlic, leeks, and the white part of scallions. Fruits  Fresh, dried, and juiced forms of apple, pear, watermelon, peach, plum, cherries, apricots, blackberries, boysenberries, figs, nectarines, and mango. Avocado. Dairy  Milk, yogurt, ice cream, and soft cheese. Cream and sour cream. Milk-based sauces. Custard. Meats and other protein foods  Fried or fatty meat. Sausage. Cashews and pistachios. Soybeans, baked beans, black beans, chickpeas, kidney beans, fava beans, navy beans, lentils, and split peas. Seasoning and other foods  Any sugar-free gum or candy. Foods that contain artificial sweeteners such as sorbitol, mannitol, isomalt, or xylitol. Foods that contain honey, high-fructose corn syrup, or agave. Bouillon, vegetable stock, beef stock, and chicken stock. Garlic and onion powder. Condiments made with onion, such as hummus, chutney, pickles, relish, salad dressing, and salsa. Tomato paste. Beverages  Chicory-based drinks. Coffee substitutes. Chamomile tea. Fennel tea. Sweet or fortified wines such as port or sherry. Diet soft drinks made with isomalt, mannitol, maltitol, sorbitol, or xylitol. Apple, pear, and mango juice. Juices with high-fructose corn syrup. This may not be a complete list of high-FODMAP foods. Talk with your dietitian to discuss what dietary choices are best for you.  Summary  A low-FODMAP eating plan is a short-term diet that eliminates FODMAPs from your diet to help ease symptoms of certain bowel diseases.  The eating plan usually lasts up to 6 weeks. After that, high-FODMAP foods are restarted gradually, one at a time, so you can find out which may be causing symptoms.  A low-FODMAP eating plan can be complicated. It is best to work with a dietitian who has experience with this type of plan. This  information is not intended to replace advice given to you by your health care provider. Make sure you discuss any questions you have with your health care provider. Document Released: 04/07/2017 Document Revised: 07/24/2017 Document Reviewed: 04/07/2017 Elsevier Patient Education  2020 Elsevier Inc.  

## 2019-05-11 NOTE — Progress Notes (Signed)
Patient ID: CAELAN MORIARITY, male   DOB: [birth year], 85 y.o.   MRN: [id number]  This visit type was conducted due to national recommendations for restrictions regarding the COVID-19 pandemic in an effort to limit this patient's exposure and mitigate transmission in our community.   Virtual Visit via Telephone Note  I connected with [name] on 05/11/19 at  3:45 PM EDT by telephone and verified that I am speaking with the correct person using two identifiers.   I discussed the limitations, risks, security and privacy concerns of performing an evaluation and management service by telephone and the availability of in person appointments. I also discussed with the patient that there may be a patient responsible charge related to this service. The patient expressed understanding and agreed to proceed.  Location patient: home Location provider: work or home office Participants present for the call: patient, provider, and pt's wife (pt has difficulty hearing and she helped facilitate) Patient did not have a visit in the prior 7 days to address this/these issue(s).   History of Present Illness: Patient has had some vague GI symptoms really now for several weeks.  He has had some recent atypical chest pain symptoms and has seen cardiology.  No evidence for cardiac etiology.  He had recent echocardiogram and nuclear stress test which were unremarkable.  It was felt that he was having more "indigestion".  They increased his Prevacid to 30 mg daily and wife thought his symptoms seemed to worsen and they reduced this themselves back to 20 mg daily.  He has had some progressive dyspnea over the past year which was felt to likely be related to deconditioning.  He is not having any consistent exertional chest pains.  Main issue now seems to be more GI related.  Is having frequent burping and increased gas symptoms.  No active GERD symptoms.  Recent CBC unremarkable.  He does drink usually a couple of  carbonated drinks per day.  Has had some irregular bowel movements for several months.  He has intermittent loose stools.  Last colonoscopy 2012 with sigmoid diverticulosis.  No history of known diverticulitis.  Has had some intermittent lower abdominal discomfort but not consistently.  Wife states he has actually gained some weight recently and appetite is fair.  His bowel movements are somewhat irregular and vary between loose and firm. No recent fever.    Past Medical History:  Diagnosis Date  . Actinic keratosis 03/13/2009  . Arthritis   . BENIGN PROSTATIC HYPERTROPHY, HX OF 04/07/2009  . CARCINOMA, SKIN, SQUAMOUS CELL 09/11/2009  . COLONIC POLYPS, HX OF 03/13/2009  . Essential hypertension   . GERD 03/13/2009  . Hyperlipidemia   . INSOMNIA, CHRONIC 09/11/2009  . Persistent atrial fibrillation 04/07/2009   a. s/p afib ablation at [facility] x 2 in 2010.  Marland Kitchen POLYPECTOMY, HX OF 04/07/2009  . Premature atrial contractions   . PULMONARY NODULE 09/11/2009  . PVC's (premature ventricular contractions)   . Rosacea 03/13/2009  . SKIN CANCER, HX OF 03/13/2009   Past Surgical History:  Procedure Laterality Date  . ABLATION  2010   x2  . COLONOSCOPY W/ BIOPSIES AND POLYPECTOMY    . EYE SURGERY Bilateral    cataracts  . LUMBAR DISC SURGERY     RUPTURE  . LUMBAR LAMINECTOMY/DECOMPRESSION MICRODISCECTOMY N/A 04/20/2014   Procedure: LUMBAR TWO-THREE, LUMBAR THREE-FOUR, LUMAR FOUR-FIVE LAMINECTOMIES;  Surgeon: Newman Pies, MD;  Location: [facility];  Service: Neurosurgery;  Laterality: N/A;  . POLYPECTOMY    .  SKIN CANCER EXCISION      reports that he quit smoking about 31 years ago. His smoking use included cigarettes. He has a 30.00 pack-year smoking history. His smokeless tobacco use includes chew. He reports that he does not drink alcohol or use drugs. family history includes CAD in his brother, brother, and sister; Cancer in his mother; Cancer (age of onset: 4) in his father; Heart attack in his  father; Heart disease in his brother, brother, father, and sister. Allergies  Allergen Reactions  . Amoxicillin-Pot Clavulanate Diarrhea and Nausea Only  . Losartan Other (See Comments) and Nausea And Vomiting    Elevated creatinine levels Elevated creatinine levels      Observations/Objective: Patient sounds cheerful and well on the phone. I do not appreciate any SOB. Speech and thought processing are grossly intact. Patient reported vitals:  Assessment and Plan:  #1 recent atypical chest pain with negative cardiac work-up as above.  Dyspnea probably related to deconditioning  #2 increased GI symptoms with increased gas and intermittent loose stools. -Recommend low FODMAPs type diet with handout given-sent through my chart -Continue Prevacid 20 mg daily -Recommend they try to reduce his carbonated beverage intake to see if this reduces his burping and gas symptoms -Discussed possible further evaluation with CT scan but at this point they wish to wait  Follow Up Instructions:  -They will try the low FODMAPs diet and reducing carbonated beverages and watch closely for any fever or any left lower quadrant abdominal pain   [id number] [id number] [id number] 11-20 [id number] 21-30 I did not refer this patient for an OV in the next 24 hours for this/these issue(s).  I discussed the assessment and treatment plan with the patient. The patient was provided an opportunity to ask questions and all were answered. The patient agreed with the plan and demonstrated an understanding of the instructions.   The patient was advised to call back or seek an in-person evaluation if the symptoms worsen or if the condition fails to improve as anticipated.  I provided 25 minutes of non-face-to-face time during this encounter.   Carolann Littler, MD

## 2019-05-12 ENCOUNTER — Telehealth: Payer: Self-pay | Admitting: Cardiology

## 2019-05-12 NOTE — Telephone Encounter (Signed)
I spoke to the patient's wife and inquired about the patient's Lasix intake as requested by Dayna (pertaining to kidney function/Eliquis adjustment).    She said that he only takes when swelling and would say that it averages maybe every 3 months.  He takes for 2-3 days and it is resolved.

## 2019-05-12 NOTE — Telephone Encounter (Signed)
New Message  Patient is returning call about results of lab work. Please give patient a call back.

## 2019-05-12 NOTE — Telephone Encounter (Signed)
Thank you Legrand Como! I will review Eliquis dosing further with Dr. Radford Pax based on this update.

## 2019-05-13 MED ORDER — APIXABAN 2.5 MG PO TABS: 2.5000 mg | ORAL_TABLET | Freq: Two times a day (BID) | ORAL | 3 refills | Status: DC

## 2019-05-13 NOTE — Telephone Encounter (Signed)
-----   Message from [name], [place] sent at 05/12/2019  7:51 AM EDT ----- Can you reach out to Mr. Moose again today?

## 2019-05-27 DIAGNOSIS — Z961 Presence of intraocular lens: Secondary | ICD-10-CM | POA: Diagnosis not present

## 2019-05-27 DIAGNOSIS — H04123 Dry eye syndrome of bilateral lacrimal glands: Secondary | ICD-10-CM | POA: Diagnosis not present

## 2019-05-27 DIAGNOSIS — H04213 Epiphora due to excess lacrimation, bilateral lacrimal glands: Secondary | ICD-10-CM | POA: Diagnosis not present

## 2019-05-27 DIAGNOSIS — H40013 Open angle with borderline findings, low risk, bilateral: Secondary | ICD-10-CM | POA: Diagnosis not present

## 2019-06-02 ENCOUNTER — Other Ambulatory Visit: Payer: Self-pay | Admitting: Cardiology

## 2019-06-02 NOTE — Telephone Encounter (Signed)
Please send to primary care

## 2019-06-02 NOTE — Telephone Encounter (Signed)
[facility] requesting a refill on lansoprazole. Would Dr. Radford Pax like to refill this medication? Please address

## 2019-06-10 DIAGNOSIS — L821 Other seborrheic keratosis: Secondary | ICD-10-CM | POA: Diagnosis not present

## 2019-06-10 DIAGNOSIS — D1801 Hemangioma of skin and subcutaneous tissue: Secondary | ICD-10-CM | POA: Diagnosis not present

## 2019-06-10 DIAGNOSIS — C44629 Squamous cell carcinoma of skin of left upper limb, including shoulder: Secondary | ICD-10-CM | POA: Diagnosis not present

## 2019-06-10 DIAGNOSIS — C44529 Squamous cell carcinoma of skin of other part of trunk: Secondary | ICD-10-CM | POA: Diagnosis not present

## 2019-06-10 DIAGNOSIS — D485 Neoplasm of uncertain behavior of skin: Secondary | ICD-10-CM | POA: Diagnosis not present

## 2019-06-10 DIAGNOSIS — L570 Actinic keratosis: Secondary | ICD-10-CM | POA: Diagnosis not present

## 2019-06-10 DIAGNOSIS — Z85828 Personal history of other malignant neoplasm of skin: Secondary | ICD-10-CM | POA: Diagnosis not present

## 2019-06-10 DIAGNOSIS — D225 Melanocytic nevi of trunk: Secondary | ICD-10-CM | POA: Diagnosis not present

## 2019-06-24 ENCOUNTER — Other Ambulatory Visit: Payer: Self-pay

## 2019-06-24 ENCOUNTER — Other Ambulatory Visit: Payer: Medicare Other | Admitting: *Deleted

## 2019-06-24 DIAGNOSIS — Z79899 Other long term (current) drug therapy: Secondary | ICD-10-CM

## 2019-06-24 LAB — BASIC METABOLIC PANEL
BUN/Creatinine Ratio: 13 (ref 10–24)
BUN: 18 mg/dL (ref 8–27)
CO2: 19 mmol/L — ABNORMAL LOW (ref 20–29)
Calcium: 9.3 mg/dL (ref 8.6–10.2)
Chloride: 103 mmol/L (ref 96–106)
Creatinine, Ser: 1.39 mg/dL — ABNORMAL HIGH (ref 0.76–1.27)
GFR calc Af Amer: 53 mL/min/{1.73_m2} — ABNORMAL LOW (ref >59)
GFR calc non Af Amer: 46 mL/min/{1.73_m2} — ABNORMAL LOW (ref >59)
Glucose: 134 mg/dL — ABNORMAL HIGH (ref 65–99)
Potassium: 4.4 mmol/L (ref 3.5–5.2)
Sodium: 140 mmol/L (ref 134–144)

## 2019-06-27 ENCOUNTER — Telehealth: Payer: Self-pay | Admitting: *Deleted

## 2019-06-27 DIAGNOSIS — Z79899 Other long term (current) drug therapy: Secondary | ICD-10-CM

## 2019-06-27 MED ORDER — APIXABAN 5 MG PO TABS: 5.0000 mg | ORAL_TABLET | Freq: Two times a day (BID) | ORAL | 11 refills | Status: DC

## 2019-06-27 NOTE — Telephone Encounter (Signed)
-----   Message from [name], [place] sent at 06/24/2019  4:57 PM EDT ----- Kidney function actually continues to improve and is now back closer to baseline so technically qualifies back to Eliquis 5mg  BID dose.  Unfortunately given the back and forth with his kidney function we need to keep an eye on this, so let's get another BMET in 1-2 weeks to make sure this improved trend is here to stay.   He likely has both doses so please make sure he understands directions.  Dayna Dunn PA-C

## 2019-07-05 ENCOUNTER — Other Ambulatory Visit: Payer: Medicare Other

## 2019-07-05 ENCOUNTER — Other Ambulatory Visit: Payer: Self-pay | Admitting: Physician Assistant

## 2019-07-05 ENCOUNTER — Other Ambulatory Visit: Payer: Self-pay

## 2019-07-05 DIAGNOSIS — Z79899 Other long term (current) drug therapy: Secondary | ICD-10-CM

## 2019-07-05 LAB — BASIC METABOLIC PANEL WITH GFR
BUN/Creatinine Ratio: 12 (ref 10–24)
BUN: 19 mg/dL (ref 8–27)
CO2: 17 mmol/L — ABNORMAL LOW (ref 20–29)
Calcium: 9.4 mg/dL (ref 8.6–10.2)
Chloride: 104 mmol/L (ref 96–106)
Creatinine, Ser: 1.64 mg/dL — ABNORMAL HIGH (ref 0.76–1.27)
GFR calc Af Amer: 43 mL/min/1.73 — ABNORMAL LOW
GFR calc non Af Amer: 38 mL/min/1.73 — ABNORMAL LOW
Glucose: 152 mg/dL — ABNORMAL HIGH (ref 65–99)
Potassium: 4.5 mmol/L (ref 3.5–5.2)
Sodium: 140 mmol/L (ref 134–144)

## 2019-07-06 MED ORDER — APIXABAN 2.5 MG PO TABS: 2.5000 mg | ORAL_TABLET | Freq: Two times a day (BID) | ORAL | Status: DC

## 2019-07-06 NOTE — Telephone Encounter (Signed)
-----   Message from Sueanne Margarita, MD sent at 07/06/2019  8:05 AM EST ----- Keep on 2.5mg  BD ----- Message ----- From: [name], PA-C Sent: 07/06/2019   7:51 AM EST To: Sueanne Margarita, MD, Jeanann Lewandowsky, RMA  Please let pt know kidney function number is back up above 1.5 again. Would decrease Eliquis back to 2.5mg  BID for now. I will route to Dr. Radford Pax to find out what she would like to do long term given his anticoagulation - ? Switch to Xarelto 15mg  daily (CrCl has been consistently <51 but his Cr keeps jumping around the criteria for the Eliquis dose). Thanks TT! Dayna

## 2019-07-19 ENCOUNTER — Other Ambulatory Visit: Payer: Self-pay | Admitting: Physician Assistant

## 2019-07-19 MED ORDER — LANSOPRAZOLE 30 MG PO CPDR: 30.0000 mg | DELAYED_RELEASE_CAPSULE | Freq: Every day | ORAL | 1 refills | Status: DC

## 2019-07-19 NOTE — Telephone Encounter (Signed)
[facility] mail order pharmacy is requesting a refill on lansoprazole. Would Melina Copa, PA like to refill this medication? Please address

## 2019-07-29 ENCOUNTER — Other Ambulatory Visit: Payer: Self-pay | Admitting: Physician Assistant

## 2019-08-01 DIAGNOSIS — L570 Actinic keratosis: Secondary | ICD-10-CM | POA: Diagnosis not present

## 2019-08-01 DIAGNOSIS — C44629 Squamous cell carcinoma of skin of left upper limb, including shoulder: Secondary | ICD-10-CM | POA: Diagnosis not present

## 2019-08-16 ENCOUNTER — Telehealth (INDEPENDENT_AMBULATORY_CARE_PROVIDER_SITE_OTHER): Payer: Medicare Other | Admitting: Family Medicine

## 2019-08-16 ENCOUNTER — Other Ambulatory Visit: Payer: Self-pay

## 2019-08-16 DIAGNOSIS — R062 Wheezing: Secondary | ICD-10-CM

## 2019-08-16 DIAGNOSIS — I200 Unstable angina: Secondary | ICD-10-CM

## 2019-08-16 DIAGNOSIS — K219 Gastro-esophageal reflux disease without esophagitis: Secondary | ICD-10-CM | POA: Diagnosis not present

## 2019-08-16 DIAGNOSIS — R0600 Dyspnea, unspecified: Secondary | ICD-10-CM | POA: Diagnosis not present

## 2019-08-16 MED ORDER — BUDESONIDE-FORMOTEROL FUMARATE 160-4.5 MCG/ACT IN AERO: 2.0000 | INHALATION_SPRAY | Freq: Two times a day (BID) | RESPIRATORY_TRACT | 11 refills | Status: DC

## 2019-08-16 NOTE — Progress Notes (Signed)
This visit type was conducted due to national recommendations for restrictions regarding the COVID-19 pandemic in an effort to limit this patient's exposure and mitigate transmission in our community.   Virtual Visit via Video Note  I connected with [name] on 08/16/19 at 10:00 AM EST by a video enabled telemedicine application and verified that I am speaking with the correct person using two identifiers.  Location patient: home Location provider:work or home office Persons participating in the virtual visit: patient, provider  I discussed the limitations of evaluation and management by telemedicine and the availability of in person appointments. The patient expressed understanding and agreed to proceed.   HPI: [name] has chronic problems including history of severe spinal stenosis of the lumbar spine, history of recurrent skin cancers, atrial fibrillation, hyperlipidemia, hypertension.  He states over the past year he has been wheezing "all the time ".  He states this is basically a daily wheeze which is wife has noted particularly.  He does have increased dyspnea.  For example, he states he has increased shortness of breath with just ambulating about 50 or 60 feet to the mailbox.  No chest pain.  Does have occasional cough sometimes productive of thick sputum in the mornings.  No hemoptysis.  No appetite or weight changes.  No recent fever.  No increased peripheral edema  He does have history of GERD which is controlled with Prevacid.  No active GERD symptoms.  He denies any postnasal drip symptoms.  No history of asthma.  He smoked from age 12 to age 77 and then again resumed smoking age 61 until age 27 about 1 pack/day.  No past documentation or history of COPD.  He is not aware of any prior pulmonary function testing.  No known history of interstitial lung disease.  No known occupational exposures for high risk of interstitial disease   ROS: See pertinent positives and negatives  per HPI.  Past Medical History:  Diagnosis Date  . Actinic keratosis 03/13/2009  . Arthritis   . BENIGN PROSTATIC HYPERTROPHY, HX OF 04/07/2009  . CARCINOMA, SKIN, SQUAMOUS CELL 09/11/2009  . COLONIC POLYPS, HX OF 03/13/2009  . Essential hypertension   . GERD 03/13/2009  . Hyperlipidemia   . INSOMNIA, CHRONIC 09/11/2009  . Persistent atrial fibrillation 04/07/2009   a. s/p afib ablation at [facility] x 2 in 2010.  Marland Kitchen POLYPECTOMY, HX OF 04/07/2009  . Premature atrial contractions   . PULMONARY NODULE 09/11/2009  . PVC's (premature ventricular contractions)   . Rosacea 03/13/2009  . SKIN CANCER, HX OF 03/13/2009    Past Surgical History:  Procedure Laterality Date  . ABLATION  2010   x2  . COLONOSCOPY W/ BIOPSIES AND POLYPECTOMY    . EYE SURGERY Bilateral    cataracts  . LUMBAR DISC SURGERY     RUPTURE  . LUMBAR LAMINECTOMY/DECOMPRESSION MICRODISCECTOMY N/A 04/20/2014   Procedure: LUMBAR TWO-THREE, LUMBAR THREE-FOUR, LUMAR FOUR-FIVE LAMINECTOMIES;  Surgeon: Newman Pies, MD;  Location: [facility];  Service: Neurosurgery;  Laterality: N/A;  . POLYPECTOMY    . SKIN CANCER EXCISION      Family History  Problem Relation Age of Onset  . Cancer Mother   . Cancer Father 82       COLON  . Heart attack Father   . Heart disease Father   . Heart disease Sister   . CAD Sister   . Heart disease Brother   . CAD Brother   . CAD Brother   . Heart  disease Brother     SOCIAL HX: Smoking history as above.  Approximately 30-pack-year history   Current Outpatient Medications:  .  acetaminophen (TYLENOL) 500 MG tablet, Take 500 mg by mouth every 6 (six) hours as needed for mild pain., Disp: , Rfl:  .  amLODipine (NORVASC) 2.5 MG tablet, TAKE 1 TABLET BY MOUTH ONCE DAILY, Disp: 90 tablet, Rfl: 3 .  apixaban (ELIQUIS) 2.5 MG TABS tablet, Take 1 tablet (2.5 mg total) by mouth 2 (two) times daily., Disp: , Rfl:  .  budesonide-formoterol (SYMBICORT) 160-4.5 MCG/ACT inhaler, Inhale 2 puffs into the  lungs 2 (two) times daily., Disp: 1 Inhaler, Rfl: 11 .  clobetasol ointment (TEMOVATE) [id number] %, Apply 1 application topically 2 (two) times daily as needed (Rash). , Disp: , Rfl:  .  furosemide (LASIX) 20 MG tablet, Take 1 tablet (20 mg total) by mouth daily as needed for fluid., Disp: , Rfl:  .  gabapentin (NEURONTIN) 100 MG capsule, TAKE 1 CAPSULE BY MOUTH IN  THE MORNING AND 2 CAPSULES  AT BEDTIME, Disp: 270 capsule, Rfl: 3 .  lansoprazole (PREVACID) 30 MG capsule, TAKE 1 CAPSULE (30 MG TOTAL) BY MOUTH DAILY AT 12 NOON., Disp: 30 capsule, Rfl: 9 .  lovastatin (MEVACOR) 20 MG tablet, TAKE 1 TABLET BY MOUTH AT  BEDTIME, Disp: 90 tablet, Rfl: 1 .  metoprolol succinate (TOPROL-XL) 25 MG 24 hr tablet, TAKE 1 TABLET BY MOUTH  DAILY (Patient taking differently: Take 12.5 mg by mouth daily. ), Disp: 90 tablet, Rfl: 3 .  metroNIDAZOLE (METROCREAM) 0.75 % cream, APPLY TOPICALLY 3 TIMES A  WEEK ON THE DAYS HE SHAVES, Disp: 45 g, Rfl: 1 .  nitroGLYCERIN (NITROSTAT) 0.4 MG SL tablet, Place 1 tablet (0.4 mg total) under the tongue every 5 (five) minutes x 3 doses as needed for chest pain., Disp: 25 tablet, Rfl: 0 .  tamsulosin (FLOMAX) 0.4 MG CAPS capsule, TAKE 1 CAPSULE BY MOUTH  ONCE DAILY, Disp: 90 capsule, Rfl: 3 .  vitamin B-12 (CYANOCOBALAMIN) 500 MCG tablet, Take 500 mcg by mouth daily., Disp: , Rfl:  .  VITAMIN D PO, Take 1 capsule by mouth daily., Disp: , Rfl:   EXAM:  VITALS per patient if applicable:  GENERAL: alert, oriented, appears well and in no acute distress  HEENT: atraumatic, conjunttiva clear, no obvious abnormalities on inspection of external nose and ears  NECK: normal movements of the head and neck  LUNGS: on inspection no signs of respiratory distress, breathing rate appears normal, no obvious gross SOB, gasping or wheezing  CV: no obvious cyanosis  MS: moves all visible extremities without noticeable abnormality  PSYCH/NEURO: pleasant and cooperative, no obvious depression  or anxiety, speech and thought processing grossly intact  ASSESSMENT AND PLAN:  Discussed the following assessment and plan:  Patient presents with several month to 1 year history of almost daily wheezing and progressive dyspnea with exertion.  He is very limited in activity because of severe lumbar stenosis and some of this dyspnea he realizes may be deconditioning.  Increased risk for COPD but not tested previously.  We discussed the following  -We discussed that ideally would probably get repeat chest x-ray and pulmonary function testing to further clarify but he is somewhat reluctant because of current pandemic. -We agreed to empiric trial of Symbicort 160/4.5 take 2 puffs twice daily with rinse of mouth after each use -Flu vaccine already given -We will plan 3-week follow-up to reassess symptoms.  If not improving with the  above will recommend he go ahead and proceed with pulmonary function testing     I discussed the assessment and treatment plan with the patient. The patient was provided an opportunity to ask questions and all were answered. The patient agreed with the plan and demonstrated an understanding of the instructions.   The patient was advised to call back or seek an in-person evaluation if the symptoms worsen or if the condition fails to improve as anticipated.     Carolann Littler, MD

## 2019-08-31 ENCOUNTER — Other Ambulatory Visit: Payer: Self-pay

## 2019-08-31 MED ORDER — LANSOPRAZOLE 30 MG PO CPDR: 30.0000 mg | DELAYED_RELEASE_CAPSULE | Freq: Every day | ORAL | 2 refills | Status: DC

## 2019-08-31 MED ORDER — APIXABAN 2.5 MG PO TABS: 2.5000 mg | ORAL_TABLET | Freq: Two times a day (BID) | ORAL | 2 refills | Status: DC

## 2019-08-31 NOTE — Telephone Encounter (Signed)
Eliquis 2.5mg  refill request received;Pt is 86 yrs old, weight-89.4kg, Crea-1.64 on 07/05/2019, Diagnosis-Afib, and last seen by Melina Copa on 05/06/2019. Dose is appropriate based on dosing criteria. Will send in refill to requested pharmacy.

## 2019-09-05 ENCOUNTER — Other Ambulatory Visit: Payer: Self-pay | Admitting: Cardiology

## 2019-09-06 ENCOUNTER — Other Ambulatory Visit: Payer: Self-pay

## 2019-09-06 MED ORDER — METOPROLOL SUCCINATE ER 25 MG PO TB24: 25.0000 mg | ORAL_TABLET | Freq: Every day | ORAL | 1 refills | Status: DC

## 2019-09-06 MED ORDER — LOVASTATIN 20 MG PO TABS: 20.0000 mg | ORAL_TABLET | Freq: Every day | ORAL | 1 refills | Status: DC

## 2019-09-06 MED ORDER — TAMSULOSIN HCL 0.4 MG PO CAPS: 0.4000 mg | ORAL_CAPSULE | Freq: Every day | ORAL | 1 refills | Status: DC

## 2019-09-06 MED ORDER — AMLODIPINE BESYLATE 2.5 MG PO TABS: 2.5000 mg | ORAL_TABLET | Freq: Every day | ORAL | 1 refills | Status: DC

## 2019-09-06 MED ORDER — GABAPENTIN 100 MG PO CAPS: ORAL_CAPSULE | ORAL | 1 refills | Status: DC

## 2019-09-13 ENCOUNTER — Telehealth (INDEPENDENT_AMBULATORY_CARE_PROVIDER_SITE_OTHER): Payer: Medicare PPO | Admitting: Family Medicine

## 2019-09-13 ENCOUNTER — Other Ambulatory Visit: Payer: Self-pay

## 2019-09-13 DIAGNOSIS — K219 Gastro-esophageal reflux disease without esophagitis: Secondary | ICD-10-CM

## 2019-09-13 DIAGNOSIS — R062 Wheezing: Secondary | ICD-10-CM | POA: Diagnosis not present

## 2019-09-13 DIAGNOSIS — R0600 Dyspnea, unspecified: Secondary | ICD-10-CM | POA: Diagnosis not present

## 2019-09-13 NOTE — Progress Notes (Signed)
This visit type was conducted due to national recommendations for restrictions regarding the COVID-19 pandemic in an effort to limit this patient's exposure and mitigate transmission in our community.   Virtual Visit via Video Note  I connected with [name] on 09/13/19 at 11:00 AM EST by a video enabled telemedicine application and verified that I am speaking with the correct person using two identifiers.  Location patient: home Location provider:work or home office Persons participating in the virtual visit: patient, provider  I discussed the limitations of evaluation and management by telemedicine and the availability of in person appointments. The patient expressed understanding and agreed to proceed.   HPI: [name] is seen for follow-up regarding wheezing and shortness of breath.  Refer to prior note for details.  Suspected COPD.  We empirically decided to give him a trial of Symbicort 160 mg 2 puffs twice daily.  His wife notices that he does not have any audible wheezing now as he had previously.  He has not seen any improvement much in terms of dyspnea with walking.  No recent chest pains.  No cough.  No fever.  He had recent echocardiogram back in August with ejection fraction 55 to 60%.  Nuclear stress test no acute findings. Ex-smoker as previously noted  They had questions regarding his chronic proton pump inhibitor use.  He is on Prevacid.  They had read about issues with potential B12 and magnesium deficiency.  He has some general muscle aches and joint aches but is not describing any significant neuropathy symptoms.  He has had both B12 and magnesium levels checked during the past year which have been normal.  His reflux symptoms have been well controlled with Prevacid   ROS: See pertinent positives and negatives per HPI.  Past Medical History:  Diagnosis Date  . Actinic keratosis 03/13/2009  . Arthritis   . BENIGN PROSTATIC HYPERTROPHY, HX OF 04/07/2009  .  CARCINOMA, SKIN, SQUAMOUS CELL 09/11/2009  . COLONIC POLYPS, HX OF 03/13/2009  . Essential hypertension   . GERD 03/13/2009  . Hyperlipidemia   . INSOMNIA, CHRONIC 09/11/2009  . Persistent atrial fibrillation 04/07/2009   a. s/p afib ablation at [facility] x 2 in 2010.  Marland Kitchen POLYPECTOMY, HX OF 04/07/2009  . Premature atrial contractions   . PULMONARY NODULE 09/11/2009  . PVC's (premature ventricular contractions)   . Rosacea 03/13/2009  . SKIN CANCER, HX OF 03/13/2009    Past Surgical History:  Procedure Laterality Date  . ABLATION  2010   x2  . COLONOSCOPY W/ BIOPSIES AND POLYPECTOMY    . EYE SURGERY Bilateral    cataracts  . LUMBAR DISC SURGERY     RUPTURE  . LUMBAR LAMINECTOMY/DECOMPRESSION MICRODISCECTOMY N/A 04/20/2014   Procedure: LUMBAR TWO-THREE, LUMBAR THREE-FOUR, LUMAR FOUR-FIVE LAMINECTOMIES;  Surgeon: Newman Pies, MD;  Location: [place] [facility];  Service: Neurosurgery;  Laterality: N/A;  . POLYPECTOMY    . SKIN CANCER EXCISION      Family History  Problem Relation Age of Onset  . Cancer Mother   . Cancer Father 13       COLON  . Heart attack Father   . Heart disease Father   . Heart disease Sister   . CAD Sister   . Heart disease Brother   . CAD Brother   . CAD Brother   . Heart disease Brother     SOCIAL HX: Past history of smoking from age [age] and then age 10 to age 22 about 1 pack/day  Current Outpatient Medications:  .  acetaminophen (TYLENOL) 500 MG tablet, Take 500 mg by mouth every 6 (six) hours as needed for mild pain., Disp: , Rfl:  .  amLODipine (NORVASC) 2.5 MG tablet, Take 1 tablet (2.5 mg total) by mouth daily., Disp: 90 tablet, Rfl: 1 .  apixaban (ELIQUIS) 2.5 MG TABS tablet, Take 1 tablet (2.5 mg total) by mouth 2 (two) times daily., Disp: 180 tablet, Rfl: 2 .  budesonide-formoterol (SYMBICORT) 160-4.5 MCG/ACT inhaler, Inhale 2 puffs into the lungs 2 (two) times daily., Disp: 1 Inhaler, Rfl: 11 .  clobetasol ointment (TEMOVATE) AB-123456789 %, Apply 1  application topically 2 (two) times daily as needed (Rash). , Disp: , Rfl:  .  furosemide (LASIX) 20 MG tablet, Take 1 tablet (20 mg total) by mouth daily as needed for fluid., Disp: , Rfl:  .  gabapentin (NEURONTIN) 100 MG capsule, TAKE 1 CAPSULE BY MOUTH IN  THE MORNING AND 2 CAPSULES  AT BEDTIME, Disp: 270 capsule, Rfl: 1 .  lansoprazole (PREVACID) 30 MG capsule, Take 1 capsule (30 mg total) by mouth daily at 12 noon., Disp: 90 capsule, Rfl: 2 .  lovastatin (MEVACOR) 20 MG tablet, Take 1 tablet (20 mg total) by mouth at bedtime., Disp: 90 tablet, Rfl: 1 .  metoprolol succinate (TOPROL-XL) 25 MG 24 hr tablet, Take 1 tablet (25 mg total) by mouth daily., Disp: 90 tablet, Rfl: 1 .  metroNIDAZOLE (METROCREAM) 0.75 % cream, APPLY TOPICALLY 3 TIMES A  WEEK ON THE DAYS HE SHAVES, Disp: 45 g, Rfl: 1 .  nitroGLYCERIN (NITROSTAT) 0.4 MG SL tablet, Place 1 tablet (0.4 mg total) under the tongue every 5 (five) minutes x 3 doses as needed for chest pain., Disp: 25 tablet, Rfl: 0 .  tamsulosin (FLOMAX) 0.4 MG CAPS capsule, Take 1 capsule (0.4 mg total) by mouth daily., Disp: 90 capsule, Rfl: 1 .  vitamin B-12 (CYANOCOBALAMIN) 500 MCG tablet, Take 500 mcg by mouth daily., Disp: , Rfl:  .  VITAMIN D PO, Take 1 capsule by mouth daily., Disp: , Rfl:   EXAM:  VITALS per patient if applicable:  GENERAL: alert, oriented, appears well and in no acute distress  HEENT: atraumatic, conjunttiva clear, no obvious abnormalities on inspection of external nose and ears  NECK: normal movements of the head and neck  LUNGS: on inspection no signs of respiratory distress, breathing rate appears normal, no obvious gross SOB, gasping or wheezing  CV: no obvious cyanosis  MS: moves all visible extremities without noticeable abnormality  PSYCH/NEURO: pleasant and cooperative, no obvious depression or anxiety, speech and thought processing grossly intact  ASSESSMENT AND PLAN:  Discussed the following assessment and  plan:  #1 dyspnea and wheezing-slightly improved on Symbicort.  No history of systolic heart failure.  Suspected COPD.  Not confirmed with pulmonary function testing  -Continue Symbicort 2 puffs twice daily. -We discussed possible use of antimuscarinic medications but at this point would like to hold until further testing -We gave him option of referral to pulmonary for further evaluation but he would like to wait at this time  #2 GERD stable on Prevacid  -He had concerns about possible B12 and magnesium deficiency and we explained that these have both been tested within the past year and normal.  Continue oral B12 supplement     I discussed the assessment and treatment plan with the patient. The patient was provided an opportunity to ask questions and all were answered. The patient agreed with the plan and  demonstrated an understanding of the instructions.   The patient was advised to call back or seek an in-person evaluation if the symptoms worsen or if the condition fails to improve as anticipated.   Carolann Littler, MD

## 2019-10-27 DIAGNOSIS — C44319 Basal cell carcinoma of skin of other parts of face: Secondary | ICD-10-CM | POA: Diagnosis not present

## 2019-10-27 DIAGNOSIS — C44722 Squamous cell carcinoma of skin of right lower limb, including hip: Secondary | ICD-10-CM | POA: Diagnosis not present

## 2019-11-03 ENCOUNTER — Encounter: Payer: Self-pay | Admitting: Cardiology

## 2019-11-03 ENCOUNTER — Other Ambulatory Visit: Payer: Self-pay

## 2019-11-03 ENCOUNTER — Ambulatory Visit (INDEPENDENT_AMBULATORY_CARE_PROVIDER_SITE_OTHER): Payer: Medicare PPO | Admitting: Cardiology

## 2019-11-03 VITALS — BP 128/78 | HR 85 | Ht 74.0 in | Wt 205.4 lb

## 2019-11-03 DIAGNOSIS — R0789 Other chest pain: Secondary | ICD-10-CM

## 2019-11-03 DIAGNOSIS — I10 Essential (primary) hypertension: Secondary | ICD-10-CM

## 2019-11-03 DIAGNOSIS — E7800 Pure hypercholesterolemia, unspecified: Secondary | ICD-10-CM | POA: Diagnosis not present

## 2019-11-03 DIAGNOSIS — I480 Paroxysmal atrial fibrillation: Secondary | ICD-10-CM

## 2019-11-03 LAB — CBC
Hematocrit: 42.3 % (ref 37.5–51.0)
Hemoglobin: 14.2 g/dL (ref 13.0–17.7)
MCH: 33 pg (ref 26.6–33.0)
MCHC: 33.6 g/dL (ref 31.5–35.7)
MCV: 98 fL — ABNORMAL HIGH (ref 79–97)
Platelets: 209 10*3/uL (ref 150–450)
RBC: 4.3 x10E6/uL (ref 4.14–5.80)
RDW: 13.9 % (ref 11.6–15.4)
WBC: 8.1 10*3/uL (ref 3.4–10.8)

## 2019-11-03 LAB — BASIC METABOLIC PANEL
BUN/Creatinine Ratio: 13 (ref 10–24)
BUN: 20 mg/dL (ref 8–27)
CO2: 21 mmol/L (ref 20–29)
Calcium: 9.2 mg/dL (ref 8.6–10.2)
Chloride: 103 mmol/L (ref 96–106)
Creatinine, Ser: 1.56 mg/dL — ABNORMAL HIGH (ref 0.76–1.27)
GFR calc Af Amer: 46 mL/min/{1.73_m2} — ABNORMAL LOW (ref >59)
GFR calc non Af Amer: 40 mL/min/{1.73_m2} — ABNORMAL LOW (ref >59)
Glucose: 111 mg/dL — ABNORMAL HIGH (ref 65–99)
Potassium: 4.4 mmol/L (ref 3.5–5.2)
Sodium: 139 mmol/L (ref 134–144)

## 2019-11-03 NOTE — Progress Notes (Signed)
Cardiology Office Note:    Date:  11/03/2019   ID:  Pecola Leisure, DOB 1932-08-02, MRN [id number]  PCP:  [name], MD  Cardiologist:  Fransico Him, MD    Referring MD: [name], MD   Chief Complaint  Patient presents with  . Atrial Fibrillation  . Hypertension    History of Present Illness:    [name] is a 86 y.o. male with a hx of persistentatrial fibrillations/p afib ablation at [facility] x 2 in 2010, HTN, hyperlipidemia, PACs/PVCs (by monitor 2017), CKD stage III.   He has a history of prior atypical chest pain and negative stress test. Patient was having significant chest pain that had been intermittent for the last year, but had recently become more severe. Due to concern for unstable angina he was admitted to the hospital. Troponins were negative. 2D echo 04/08/19 showed EF 55-60%, no significant abnormalities. Outpatient nuc was arranged 04/15/19 which was normal, EF 55%.   He is here today for followup and is doing well.  He denies any chest pain or pressure, SOB, DOE, PND, orthopnea, LE edema, dizziness, palpitations or syncope. He is compliant with his meds and is tolerating meds with no SE.    Past Medical History:  Diagnosis Date  . Actinic keratosis 03/13/2009  . Arthritis   . BENIGN PROSTATIC HYPERTROPHY, HX OF 04/07/2009  . CARCINOMA, SKIN, SQUAMOUS CELL 09/11/2009  . COLONIC POLYPS, HX OF 03/13/2009  . Essential hypertension   . GERD 03/13/2009  . Hyperlipidemia   . INSOMNIA, CHRONIC 09/11/2009  . Persistent atrial fibrillation ([place]) 04/07/2009   a. s/p afib ablation at [facility] x 2 in 2010.  Marland Kitchen POLYPECTOMY, HX OF 04/07/2009  . Premature atrial contractions   . PULMONARY NODULE 09/11/2009  . PVC's (premature ventricular contractions)   . Rosacea 03/13/2009  . SKIN CANCER, HX OF 03/13/2009    Past Surgical History:  Procedure Laterality Date  . ABLATION  2010   x2  . COLONOSCOPY W/ BIOPSIES AND POLYPECTOMY    . EYE SURGERY Bilateral     cataracts  . LUMBAR DISC SURGERY     RUPTURE  . LUMBAR LAMINECTOMY/DECOMPRESSION MICRODISCECTOMY N/A 04/20/2014   Procedure: LUMBAR TWO-THREE, LUMBAR THREE-FOUR, LUMAR FOUR-FIVE LAMINECTOMIES;  Surgeon: Newman Pies, MD;  Location: [place] [facility];  Service: Neurosurgery;  Laterality: N/A;  . POLYPECTOMY    . SKIN CANCER EXCISION      Current Medications: Current Meds  Medication Sig  . acetaminophen (TYLENOL) 500 MG tablet Take 500 mg by mouth every 6 (six) hours as needed for mild pain.  Marland Kitchen amLODipine (NORVASC) 2.5 MG tablet Take 1 tablet (2.5 mg total) by mouth daily.  Marland Kitchen apixaban (ELIQUIS) 2.5 MG TABS tablet Take 1 tablet (2.5 mg total) by mouth 2 (two) times daily.  . budesonide-formoterol (SYMBICORT) 160-4.5 MCG/ACT inhaler Inhale 2 puffs into the lungs 2 (two) times daily.  . clobetasol ointment (TEMOVATE) AB-123456789 % Apply 1 application topically 2 (two) times daily as needed (Rash).   . furosemide (LASIX) 20 MG tablet Take 1 tablet (20 mg total) by mouth daily as needed for fluid.  Marland Kitchen gabapentin (NEURONTIN) 100 MG capsule Take 100 mg by mouth at bedtime.  . lansoprazole (PREVACID) 30 MG capsule Take 1 capsule (30 mg total) by mouth daily at 12 noon.  . lovastatin (MEVACOR) 20 MG tablet Take 1 tablet (20 mg total) by mouth at bedtime.  . metoprolol succinate (TOPROL-XL) 25 MG 24 hr tablet Take 1 tablet (  25 mg total) by mouth daily.  . metroNIDAZOLE (METROCREAM) 0.75 % cream APPLY TOPICALLY 3 TIMES A  WEEK ON THE DAYS HE SHAVES  . nitroGLYCERIN (NITROSTAT) 0.4 MG SL tablet Place 1 tablet (0.4 mg total) under the tongue every 5 (five) minutes x 3 doses as needed for chest pain.  . tamsulosin (FLOMAX) 0.4 MG CAPS capsule Take 1 capsule (0.4 mg total) by mouth daily.  . vitamin B-12 (CYANOCOBALAMIN) 500 MCG tablet Take 500 mcg by mouth daily.  Marland Kitchen VITAMIN D PO Take 1 capsule by mouth daily.     Allergies:   Amoxicillin-pot clavulanate and Losartan   Social History   Socioeconomic History   . Marital status: Married    Spouse name: Not on file  . Number of children: Not on file  . Years of education: Not on file  . Highest education level: Not on file  Occupational History  . Not on file  Tobacco Use  . Smoking status: Former Smoker    Packs/day: 1.00    Years: 30.00    Pack years: 30.00    Types: Cigarettes    Quit date: 03/13/1988    Years since quitting: 31.6  . Smokeless tobacco: Current User    Types: Chew  Substance and Sexual Activity  . Alcohol use: No  . Drug use: No  . Sexual activity: Not on file  Other Topics Concern  . Not on file  Social History Narrative  . Not on file   Social Determinants of Health   Financial Resource Strain:   . Difficulty of Paying Living Expenses:   Food Insecurity:   . Worried About Charity fundraiser in the Last Year:   . Arboriculturist in the Last Year:   Transportation Needs:   . Film/video editor (Medical):   Marland Kitchen Lack of Transportation (Non-Medical):   Physical Activity:   . Days of Exercise per Week:   . Minutes of Exercise per Session:   Stress:   . Feeling of Stress :   Social Connections:   . Frequency of Communication with Friends and Family:   . Frequency of Social Gatherings with Friends and Family:   . Attends Religious Services:   . Active Member of Clubs or Organizations:   . Attends Archivist Meetings:   Marland Kitchen Marital Status:      Family History: The patient's family history includes CAD in his brother, brother, and sister; Cancer in his mother; Cancer (age of onset: 64) in his father; Heart attack in his father; Heart disease in his brother, brother, father, and sister.  ROS:   Please see the history of present illness.    ROS  All other systems reviewed and negative.   EKGs/Labs/Other Studies Reviewed:    The following studies were reviewed today: Prior OV notes  EKG:  EKG is not ordered today.  Recent Labs: 03/16/2019: TSH 3.02 04/07/2019: ALT 13; Magnesium  1.9 04/08/2019: Hemoglobin 13.8; Platelets 197 07/05/2019: BUN 19; Creatinine, Ser 1.64; Potassium 4.5; Sodium 140   Recent Lipid Panel    Component Value Date/Time   CHOL 128 04/08/2019 0358   TRIG 66 04/08/2019 0358   HDL 28 (L) 04/08/2019 0358   CHOLHDL 4.6 04/08/2019 0358   VLDL 13 04/08/2019 0358   LDLCALC 87 04/08/2019 0358   LDLDIRECT 79.3 09/30/2013 1038    Physical Exam:    VS:  BP 128/78   Pulse 85   Ht 6\' 2"  (1.88 m)  Wt 205 lb 6.4 oz (93.2 kg)   SpO2 95%   BMI 26.37 kg/m     Wt Readings from Last 3 Encounters:  11/03/19 205 lb 6.4 oz (93.2 kg)  05/06/19 197 lb 1.9 oz (89.4 kg)  04/15/19 189 lb (85.7 kg)     GEN:  Well nourished, well developed in no acute distress HEENT: Normal NECK: No JVD; No carotid bruits LYMPHATICS: No lymphadenopathy CARDIAC: RRR, no murmurs, rubs, gallops RESPIRATORY:  Clear to auscultation without rales, wheezing or rhonchi  ABDOMEN: Soft, non-tender, non-distended MUSCULOSKELETAL:  No edema; No deformity  SKIN: Warm and dry NEUROLOGIC:  Alert and oriented x 3 PSYCHIATRIC:  Normal affect   ASSESSMENT:    1. Paroxysmal atrial fibrillation (HCC)   2. Atypical chest pain   3. Essential hypertension   4. Pure hypercholesterolemia    PLAN:    In order of problems listed above:  1.  PAF -he is maintaining NSR on exam -he denies any palptiations or bleeding problems on the DOAC -continue Eliquis 2.3mg  BID and Toprol XL 25mg  daily -check BMET and CBC today  2.  Atypical chest pain -he has not had any further CP since starting on PPI -he has chronic DOE when walking to the mailbox related to problems with severe back pain that is chronic and stable -nuclear stress test showed no ischemia 8/'2020  3.  HTN -BP controlled -continue amlodipine 2.5mg  BID and Toprol XL 25mg  daily  4.  HLD -LDL goal < 100 -continue Lovastatin 20mg  daily   Medication Adjustments/Labs and Tests Ordered: Current medicines are reviewed at  length with the patient today.  Concerns regarding medicines are outlined above.  Orders Placed This Encounter  Procedures  . Basic metabolic panel  . CBC   No orders of the defined types were placed in this encounter.   Signed, Fransico Him, MD  11/03/2019 10:44 AM    [place]

## 2019-11-03 NOTE — Patient Instructions (Signed)
Medication Instructions:  Your physician recommends that you continue on your current medications as directed. Please refer to the Current Medication list given to you today.   *If you need a refill on your cardiac medications before your next appointment, please call your pharmacy*   Lab Work: TODAY: BMET, CBC If you have labs (blood work) drawn today and your tests are completely normal, you will receive your results only by: Marland Kitchen MyChart Message (if you have MyChart) OR . A paper copy in the mail If you have any lab test that is abnormal or we need to change your treatment, we will call you to review the results.   Follow-Up: At [facility], you and your health needs are our priority.  As part of our continuing mission to provide you with exceptional heart care, we have created designated Provider Care Teams.  These Care Teams include your primary Cardiologist (physician) and Advanced Practice Providers (APPs -  Physician Assistants and Nurse Practitioners) who all work together to provide you with the care you need, when you need it.  We recommend signing up for the patient portal called "MyChart".  Sign up information is provided on this After Visit Summary.  MyChart is used to connect with patients for Virtual Visits (Telemedicine).  Patients are able to view lab/test results, encounter notes, upcoming appointments, etc.  Non-urgent messages can be sent to your provider as well.   To learn more about what you can do with MyChart, go to [email or web address].    Your next appointment:   1 year(s)  The format for your next appointment:   In Person  Provider:   Fransico Him, MD

## 2019-11-07 DIAGNOSIS — Z570 Occupational exposure to noise: Secondary | ICD-10-CM | POA: Diagnosis not present

## 2019-11-07 DIAGNOSIS — H903 Sensorineural hearing loss, bilateral: Secondary | ICD-10-CM | POA: Diagnosis not present

## 2019-12-16 DIAGNOSIS — C4441 Basal cell carcinoma of skin of scalp and neck: Secondary | ICD-10-CM | POA: Diagnosis not present

## 2019-12-16 DIAGNOSIS — L820 Inflamed seborrheic keratosis: Secondary | ICD-10-CM | POA: Diagnosis not present

## 2019-12-16 DIAGNOSIS — L570 Actinic keratosis: Secondary | ICD-10-CM | POA: Diagnosis not present

## 2019-12-16 DIAGNOSIS — C44622 Squamous cell carcinoma of skin of right upper limb, including shoulder: Secondary | ICD-10-CM | POA: Diagnosis not present

## 2019-12-16 DIAGNOSIS — L905 Scar conditions and fibrosis of skin: Secondary | ICD-10-CM | POA: Diagnosis not present

## 2019-12-16 DIAGNOSIS — Z85828 Personal history of other malignant neoplasm of skin: Secondary | ICD-10-CM | POA: Diagnosis not present

## 2019-12-16 DIAGNOSIS — Z85820 Personal history of malignant melanoma of skin: Secondary | ICD-10-CM | POA: Diagnosis not present

## 2019-12-16 DIAGNOSIS — D485 Neoplasm of uncertain behavior of skin: Secondary | ICD-10-CM | POA: Diagnosis not present

## 2020-01-03 ENCOUNTER — Encounter: Payer: Self-pay | Admitting: Family Medicine

## 2020-01-03 ENCOUNTER — Ambulatory Visit (INDEPENDENT_AMBULATORY_CARE_PROVIDER_SITE_OTHER): Payer: Medicare PPO | Admitting: Family Medicine

## 2020-01-03 ENCOUNTER — Other Ambulatory Visit: Payer: Self-pay

## 2020-01-03 VITALS — BP 144/72 | HR 97 | Temp 97.6°F | Wt 204.6 lb

## 2020-01-03 DIAGNOSIS — R0600 Dyspnea, unspecified: Secondary | ICD-10-CM

## 2020-01-03 NOTE — Progress Notes (Signed)
Subjective:     Patient ID: [name], male   DOB: [birth year], 86 y.o.   MRN: [id number]  HPI   [name] has past history of atrial fibrillation, hypertension, multiple skin cancers, rosacea, BPH, chronic kidney disease stage III, hyperlipidemia.  He is seen today accompanied by wife with some increased shortness of breath with exertion for really about the past year.  For example, he notices this when walking to the mailbox and this is only about 60 yards away.  He denies any orthopnea.  No recent increased peripheral edema.  No chest pains.  He had echocardiogram and nuclear stress test last summer which were unremarkable.  He has had some occasional cough over the past month or so.  Wife thinks he is wheezing intermittently.  No fever.  He had recent hemoglobin 14.2 back in March.  Quit smoking several years ago-estimated 1989.  Estimated 20-30-pack-year history of smoking.  He has Symbicort inhaler but has not seen any improvement in symptoms with that.  No recent pleuritic pain.  No appetite or weight changes other than some modest weight gain during the past year.  Past Medical History:  Diagnosis Date  . Actinic keratosis 03/13/2009  . Arthritis   . BENIGN PROSTATIC HYPERTROPHY, HX OF 04/07/2009  . CARCINOMA, SKIN, SQUAMOUS CELL 09/11/2009  . COLONIC POLYPS, HX OF 03/13/2009  . Essential hypertension   . GERD 03/13/2009  . Hyperlipidemia   . INSOMNIA, CHRONIC 09/11/2009  . Persistent atrial fibrillation ([place]) 04/07/2009   a. s/p afib ablation at [facility] x 2 in 2010.  Marland Kitchen POLYPECTOMY, HX OF 04/07/2009  . Premature atrial contractions   . PULMONARY NODULE 09/11/2009  . PVC's (premature ventricular contractions)   . Rosacea 03/13/2009  . SKIN CANCER, HX OF 03/13/2009   Past Surgical History:  Procedure Laterality Date  . ABLATION  2010   x2  . COLONOSCOPY W/ BIOPSIES AND POLYPECTOMY    . EYE SURGERY Bilateral    cataracts  . LUMBAR DISC SURGERY     RUPTURE  . LUMBAR  LAMINECTOMY/DECOMPRESSION MICRODISCECTOMY N/A 04/20/2014   Procedure: LUMBAR TWO-THREE, LUMBAR THREE-FOUR, LUMAR FOUR-FIVE LAMINECTOMIES;  Surgeon: Newman Pies, MD;  Location: [place] [facility];  Service: Neurosurgery;  Laterality: N/A;  . POLYPECTOMY    . SKIN CANCER EXCISION      reports that he quit smoking about 31 years ago. His smoking use included cigarettes. He has a 30.00 pack-year smoking history. His smokeless tobacco use includes chew. He reports that he does not drink alcohol or use drugs. family history includes CAD in his brother, brother, and sister; Cancer in his mother; Cancer (age of onset: 44) in his father; Heart attack in his father; Heart disease in his brother, brother, father, and sister. Allergies  Allergen Reactions  . Amoxicillin-Pot Clavulanate Diarrhea and Nausea Only  . Losartan Other (See Comments) and Nausea And Vomiting    Elevated creatinine levels Elevated creatinine levels   Wt Readings from Last 3 Encounters:  01/03/20 204 lb 9.6 oz (92.8 kg)  11/03/19 205 lb 6.4 oz (93.2 kg)  05/06/19 197 lb 1.9 oz (89.4 kg)     Review of Systems  Constitutional: Negative for appetite change, chills, fever and unexpected weight change.  Respiratory: Positive for cough, shortness of breath and wheezing.   Cardiovascular: Negative for chest pain, palpitations and leg swelling.  Gastrointestinal: Negative for abdominal pain.  Genitourinary: Negative for dysuria.  Musculoskeletal: Positive for back pain.  Neurological: Negative for weakness.  Psychiatric/Behavioral:  Negative for confusion.       Objective:   Physical Exam Vitals reviewed.  Constitutional:      Appearance: He is well-developed.  Cardiovascular:     Rate and Rhythm: Normal rate and regular rhythm.  Pulmonary:     Breath sounds: Normal breath sounds.     Comments: No wheezes or rales noted.  No retractions.  Pulse oximeter is 94% room air Musculoskeletal:     Right lower leg: No edema.      Left lower leg: No edema.  Neurological:     Mental Status: He is alert.        Assessment:     Patient is 86 year old ex-smoker with past history of atrial fibrillation who is seen with estimated 1 year history of some progressive dyspnea on exertion.  Unremarkable echocardiogram and nuclear stress test as above last summer.  Question pulmonary related.  Symptoms have been insidious in onset and gradual    Plan:     -EKG today shows normal sinus rhythm with no acute changes and no significant changes compared to prior tracings  -Set up PA and lateral chest x-ray to further assess  -Consider pulmonary referral but will wait on chest x-ray results first  [name] MD [facility]

## 2020-01-03 NOTE — Patient Instructions (Signed)
Shortness of Breath, Adult Shortness of breath is when a person has trouble breathing enough air or when a person feels like she or he is having trouble breathing in enough air. Shortness of breath could be a sign of a medical problem. Follow these instructions at home:   Pay attention to any changes in your symptoms.  Do not use any products that contain nicotine or tobacco, such as cigarettes, e-cigarettes, and chewing tobacco.  Do not smoke. Smoking is a common cause of shortness of breath. If you need help quitting, ask your health care provider.  Avoid things that can irritate your airways, such as: ? Mold. ? Dust. ? Air pollution. ? Chemical fumes. ? Things that can cause allergy symptoms (allergens), if you have allergies.  Keep your living space clean and free of mold and dust.  Rest as needed. Slowly return to your usual activities.  Take over-the-counter and prescription medicines only as told by your health care provider. This includes oxygen therapy and inhaled medicines.  Keep all follow-up visits as told by your health care provider. This is important. Contact a health care provider if:  Your condition does not improve as soon as expected.  You have a hard time doing your normal activities, even after you rest.  You have new symptoms. Get help right away if:  Your shortness of breath gets worse.  You have shortness of breath when you are resting.  You feel light-headed or you faint.  You have a cough that is not controlled with medicines.  You cough up blood.  You have pain with breathing.  You have pain in your chest, arms, shoulders, or abdomen.  You have a fever.  You cannot walk up stairs or exercise the way that you normally do. These symptoms may represent a serious problem that is an emergency. Do not wait to see if the symptoms will go away. Get medical help right away. Call your local emergency services (911 in the [place] Do not drive yourself  to the hospital. Summary  Shortness of breath is when a person has trouble breathing enough air. It can be a sign of a medical problem.  Avoid things that irritate your lungs, such as smoking, pollution, mold, and dust.  Pay attention to changes in your symptoms and contact your health care provider if you have a hard time completing daily activities because of shortness of breath. This information is not intended to replace advice given to you by your health care provider. Make sure you discuss any questions you have with your health care provider. Document Revised: 01/11/2018 Document Reviewed: 01/11/2018 Elsevier Patient Education  2020 [employer].  Call tomorrow to get CXR at [place]  We may need to look at pulmonary referral.

## 2020-01-04 ENCOUNTER — Ambulatory Visit (INDEPENDENT_AMBULATORY_CARE_PROVIDER_SITE_OTHER): Payer: Medicare PPO

## 2020-01-04 ENCOUNTER — Other Ambulatory Visit: Payer: Medicare PPO

## 2020-01-04 DIAGNOSIS — R0600 Dyspnea, unspecified: Secondary | ICD-10-CM

## 2020-01-16 DIAGNOSIS — C44319 Basal cell carcinoma of skin of other parts of face: Secondary | ICD-10-CM | POA: Diagnosis not present

## 2020-02-10 ENCOUNTER — Other Ambulatory Visit: Payer: Self-pay | Admitting: Family Medicine

## 2020-02-15 ENCOUNTER — Telehealth: Payer: Self-pay | Admitting: Family Medicine

## 2020-02-15 NOTE — Progress Notes (Signed)
  Chronic Care Management   Note  02/15/2020 Name: [name] MRN: [id number] DOB: 1932-01-26  [name] is a 86 y.o. year old male who is a primary care patient of Burchette, Alinda Sierras, MD. I reached out to Pecola Leisure by phone today in response to a referral sent by Mr. [name]'s PCP, [name], MD.   [name] was given information about Chronic Care Management services today including:  1. CCM service includes personalized support from designated clinical staff supervised by his physician, including individualized plan of care and coordination with other care providers 2. 24/7 contact phone numbers for assistance for urgent and routine care needs. 3. Service will only be billed when office clinical staff spend 20 minutes or more in a month to coordinate care. 4. Only one practitioner may furnish and bill the service in a calendar month. 5. The patient may stop CCM services at any time (effective at the end of the month) by phone call to the office staff.   Patient agreed to services and verbal consent obtained.   Follow up plan:  [place]

## 2020-02-17 DIAGNOSIS — C44622 Squamous cell carcinoma of skin of right upper limb, including shoulder: Secondary | ICD-10-CM | POA: Diagnosis not present

## 2020-03-27 ENCOUNTER — Encounter: Payer: Self-pay | Admitting: Family Medicine

## 2020-03-27 ENCOUNTER — Other Ambulatory Visit: Payer: Self-pay

## 2020-03-27 ENCOUNTER — Ambulatory Visit (INDEPENDENT_AMBULATORY_CARE_PROVIDER_SITE_OTHER): Payer: Medicare PPO | Admitting: Family Medicine

## 2020-03-27 VITALS — BP 110/70 | HR 69 | Temp 98.5°F | Wt 203.5 lb

## 2020-03-27 DIAGNOSIS — R5383 Other fatigue: Secondary | ICD-10-CM | POA: Diagnosis not present

## 2020-03-27 DIAGNOSIS — R0609 Other forms of dyspnea: Secondary | ICD-10-CM

## 2020-03-27 DIAGNOSIS — R1011 Right upper quadrant pain: Secondary | ICD-10-CM

## 2020-03-27 MED ORDER — TAMSULOSIN HCL 0.4 MG PO CAPS: 0.4000 mg | ORAL_CAPSULE | Freq: Every day | ORAL | 3 refills | Status: DC

## 2020-03-27 NOTE — Patient Instructions (Signed)
Abdominal Pain, Adult Pain in the abdomen (abdominal pain) can be caused by many things. Often, abdominal pain is not serious and it gets better with no treatment or by being treated at home. However, sometimes abdominal pain is serious. Your health care provider will ask questions about your medical history and do a physical exam to try to determine the cause of your abdominal pain. Follow these instructions at home:  Medicines  Take over-the-counter and prescription medicines only as told by your health care provider.  Do not take a laxative unless told by your health care provider. General instructions  Watch your condition for any changes.  Drink enough fluid to keep your urine pale yellow.  Keep all follow-up visits as told by your health care provider. This is important. Contact a health care provider if:  Your abdominal pain changes or gets worse.  You are not hungry or you lose weight without trying.  You are constipated or have diarrhea for more than 2-3 days.  You have pain when you urinate or have a bowel movement.  Your abdominal pain wakes you up at night.  Your pain gets worse with meals, after eating, or with certain foods.  You are vomiting and cannot keep anything down.  You have a fever.  You have blood in your urine. Get help right away if:  Your pain does not go away as soon as your health care provider told you to expect.  You cannot stop vomiting.  Your pain is only in areas of the abdomen, such as the right side or the left lower portion of the abdomen. Pain on the right side could be caused by appendicitis.  You have bloody or black stools, or stools that look like tar.  You have severe pain, cramping, or bloating in your abdomen.  You have signs of dehydration, such as: ? Dark urine, very little urine, or no urine. ? Cracked lips. ? Dry mouth. ? Sunken eyes. ? Sleepiness. ? Weakness.  You have trouble breathing or chest  pain. Summary  Often, abdominal pain is not serious and it gets better with no treatment or by being treated at home. However, sometimes abdominal pain is serious.  Watch your condition for any changes.  Take over-the-counter and prescription medicines only as told by your health care provider.  Contact a health care provider if your abdominal pain changes or gets worse.  Get help right away if you have severe pain, cramping, or bloating in your abdomen. This information is not intended to replace advice given to you by your health care provider. Make sure you discuss any questions you have with your health care provider. Document Revised: 12/20/2018 Document Reviewed: 12/20/2018 Elsevier Patient Education  2020 [employer]  

## 2020-03-27 NOTE — Progress Notes (Signed)
Established Patient Office Visit  Subjective:  Patient ID: [name], male    DOB: 05-20-1932  Age: 86 y.o. MRN: [id number]  CC:  Chief Complaint  Patient presents with  . Back Pain  . Abdominal Pain  . Shortness of Breath    HPI [name] presents for recent episodic abdominal pain.  He had episode Sunday morning after eating breakfast of abdominal pain which was somewhat right upper quadrant but also generalized somewhat to bilateral mid quadrants.  No nausea or vomiting.  No fever.  He estimates that his pain lasted about 10 minutes.  He has had normal bowel movements recently.  No bloody stools.  His wife gave him some type of antacid and this seemed to help although they are not sure whether this was just resolving on its own.  He had very similar episode about 2 to 3 months ago of episodic pain.  In general, his appetite has been down somewhat over the past few years but his weight is stable.  He has had some relatively chronic dyspnea.  Refer to prior note for details.  Cardiac testing last summer unremarkable.  We obtain chest x-ray recently which showed no acute findings.  He has some chronic dyspnea with activity such as walking to the mailbox.  No significant cough.  Chronic low back pain.  Has had 2 prior surgeries.  He thinks this is related to severe osteoarthritis.  No radiculitis symptoms.  He relates progressive fatigue over the past several years and he thinks a lot of this is due to less activity tolerance from his dyspnea.  Past Medical History:  Diagnosis Date  . Actinic keratosis 03/13/2009  . Arthritis   . BENIGN PROSTATIC HYPERTROPHY, HX OF 04/07/2009  . CARCINOMA, SKIN, SQUAMOUS CELL 09/11/2009  . COLONIC POLYPS, HX OF 03/13/2009  . Essential hypertension   . GERD 03/13/2009  . Hyperlipidemia   . INSOMNIA, CHRONIC 09/11/2009  . Persistent atrial fibrillation ([place]) 04/07/2009   a. s/p afib ablation at [facility] x 2 in 2010.  Marland Kitchen POLYPECTOMY, HX OF  04/07/2009  . Premature atrial contractions   . PULMONARY NODULE 09/11/2009  . PVC's (premature ventricular contractions)   . Rosacea 03/13/2009  . SKIN CANCER, HX OF 03/13/2009    Past Surgical History:  Procedure Laterality Date  . ABLATION  2010   x2  . COLONOSCOPY W/ BIOPSIES AND POLYPECTOMY    . EYE SURGERY Bilateral    cataracts  . LUMBAR DISC SURGERY     RUPTURE  . LUMBAR LAMINECTOMY/DECOMPRESSION MICRODISCECTOMY N/A 04/20/2014   Procedure: LUMBAR TWO-THREE, LUMBAR THREE-FOUR, LUMAR FOUR-FIVE LAMINECTOMIES;  Surgeon: Newman Pies, MD;  Location: [facility];  Service: Neurosurgery;  Laterality: N/A;  . POLYPECTOMY    . SKIN CANCER EXCISION      Family History  Problem Relation Age of Onset  . Cancer Mother   . Cancer Father 78       COLON  . Heart attack Father   . Heart disease Father   . Heart disease Sister   . CAD Sister   . Heart disease Brother   . CAD Brother   . CAD Brother   . Heart disease Brother     Social History   Socioeconomic History  . Marital status: Married    Spouse name: Not on file  . Number of children: Not on file  . Years of education: Not on file  . Highest education level: Not on file  Occupational  History  . Not on file  Tobacco Use  . Smoking status: Former Smoker    Packs/day: 1.00    Years: 30.00    Pack years: 30.00    Types: Cigarettes    Quit date: 03/13/1988    Years since quitting: 32.0  . Smokeless tobacco: Current User    Types: Chew  Substance and Sexual Activity  . Alcohol use: No  . Drug use: No  . Sexual activity: Not on file  Other Topics Concern  . Not on file  Social History Narrative  . Not on file   Social Determinants of Health   Financial Resource Strain:   . Difficulty of Paying Living Expenses:   Food Insecurity:   . Worried About Charity fundraiser in the Last Year:   . Arboriculturist in the Last Year:   Transportation Needs:   . Film/video editor (Medical):   Marland Kitchen Lack of  Transportation (Non-Medical):   Physical Activity:   . Days of Exercise per Week:   . Minutes of Exercise per Session:   Stress:   . Feeling of Stress :   Social Connections:   . Frequency of Communication with Friends and Family:   . Frequency of Social Gatherings with Friends and Family:   . Attends Religious Services:   . Active Member of Clubs or Organizations:   . Attends Archivist Meetings:   Marland Kitchen Marital Status:   Intimate Partner Violence:   . Fear of Current or Ex-Partner:   . Emotionally Abused:   Marland Kitchen Physically Abused:   . Sexually Abused:     Outpatient Medications Prior to Visit  Medication Sig Dispense Refill  . acetaminophen (TYLENOL) 500 MG tablet Take 500 mg by mouth every 6 (six) hours as needed for mild pain.    Marland Kitchen amLODipine (NORVASC) 2.5 MG tablet TAKE 1 TABLET EVERY DAY 90 tablet 1  . apixaban (ELIQUIS) 2.5 MG TABS tablet Take 1 tablet (2.5 mg total) by mouth 2 (two) times daily. 180 tablet 2  . budesonide-formoterol (SYMBICORT) 160-4.5 MCG/ACT inhaler Inhale 2 puffs into the lungs 2 (two) times daily. 1 Inhaler 11  . clobetasol ointment (TEMOVATE) 3.79 % Apply 1 application topically 2 (two) times daily as needed (Rash).     . furosemide (LASIX) 20 MG tablet Take 1 tablet (20 mg total) by mouth daily as needed for fluid.    Marland Kitchen gabapentin (NEURONTIN) 100 MG capsule Take 100 mg by mouth at bedtime.    . lansoprazole (PREVACID) 30 MG capsule Take 1 capsule (30 mg total) by mouth daily at 12 noon. 90 capsule 2  . lovastatin (MEVACOR) 20 MG tablet TAKE 1 TABLET (20 MG TOTAL) BY MOUTH AT BEDTIME. 90 tablet 1  . metoprolol succinate (TOPROL-XL) 25 MG 24 hr tablet TAKE 1 TABLET EVERY DAY 90 tablet 1  . metroNIDAZOLE (METROCREAM) 0.75 % cream APPLY TOPICALLY 3 TIMES A  WEEK ON THE DAYS HE SHAVES 45 g 1  . nitroGLYCERIN (NITROSTAT) 0.4 MG SL tablet Place 1 tablet (0.4 mg total) under the tongue every 5 (five) minutes x 3 doses as needed for chest pain. 25 tablet 0   . tamsulosin (FLOMAX) 0.4 MG CAPS capsule Take 1 capsule (0.4 mg total) by mouth daily. 90 capsule 1  . vitamin B-12 (CYANOCOBALAMIN) 500 MCG tablet Take 500 mcg by mouth daily.    Marland Kitchen VITAMIN D PO Take 1 capsule by mouth daily.     No facility-administered medications  prior to visit.    Allergies  Allergen Reactions  . Amoxicillin-Pot Clavulanate Diarrhea and Nausea Only  . Losartan Other (See Comments) and Nausea And Vomiting    Elevated creatinine levels Elevated creatinine levels    ROS Review of Systems  Constitutional: Positive for fatigue. Negative for chills and fever.  Respiratory: Positive for shortness of breath.   Cardiovascular: Negative for chest pain.  Gastrointestinal: Positive for abdominal pain. Negative for abdominal distention, blood in stool, constipation, diarrhea, nausea and vomiting.  Genitourinary: Negative for dysuria.  Musculoskeletal: Positive for back pain.      Objective:    Physical Exam Vitals reviewed.  Constitutional:      Appearance: He is well-developed.  Cardiovascular:     Rate and Rhythm: Normal rate.  Pulmonary:     Effort: Pulmonary effort is normal.     Breath sounds: Normal breath sounds. No wheezing or rales.  Abdominal:     General: Bowel sounds are normal.     Palpations: Abdomen is soft.     Comments: He does have some mild tenderness palpation right upper quadrant.  No masses palpated.  Lower abdomen is nontender.  Bowel sounds are normal  Genitourinary:    Testes:        Right: Swelling not present.        Left: Swelling not present.  Neurological:     Mental Status: He is alert.     BP 110/70 (BP Location: Right Arm, Patient Position: Sitting, Cuff Size: Large)   Pulse 69   Temp 98.5 F (36.9 C) (Oral)   Wt 203 lb 8 oz (92.3 kg)   SpO2 94%   BMI 26.13 kg/m  Wt Readings from Last 3 Encounters:  03/27/20 203 lb 8 oz (92.3 kg)  01/03/20 204 lb 9.6 oz (92.8 kg)  11/03/19 205 lb 6.4 oz (93.2 kg)     There  are no preventive care reminders to display for this patient.  There are no preventive care reminders to display for this patient.  Lab Results  Component Value Date   TSH 3.02 03/16/2019   Lab Results  Component Value Date   WBC 8.1 11/03/2019   HGB 14.2 11/03/2019   HCT 42.3 11/03/2019   MCV 98 (H) 11/03/2019   PLT 209 11/03/2019   Lab Results  Component Value Date   NA 139 11/03/2019   K 4.4 11/03/2019   CO2 21 11/03/2019   GLUCOSE 111 (H) 11/03/2019   BUN 20 11/03/2019   CREATININE 1.56 (H) 11/03/2019   BILITOT 0.6 04/07/2019   ALKPHOS 84 04/07/2019   AST 19 04/07/2019   ALT 13 04/07/2019   PROT 6.7 04/07/2019   ALBUMIN 3.9 04/07/2019   CALCIUM 9.2 11/03/2019   ANIONGAP 10 04/07/2019   GFR 49.80 (L) 03/16/2019   Lab Results  Component Value Date   CHOL 128 04/08/2019   Lab Results  Component Value Date   HDL 28 (L) 04/08/2019   Lab Results  Component Value Date   LDLCALC 87 04/08/2019   Lab Results  Component Value Date   TRIG 66 04/08/2019   Lab Results  Component Value Date   CHOLHDL 4.6 04/08/2019   Lab Results  Component Value Date   HGBA1C 5.7 (H) 04/07/2019      Assessment & Plan:   #1 abdominal pain.  Has had couple episodes including most recently Sunday morning of somewhat poorly localized pain but he did point toward the right upper quadrant predominantly.  No  associated nausea or vomiting or fever.  Rule out gallstones  -Check labs with CBC, comprehensive metabolic panel, lipase -Set up abdominal ultrasound to further assess -Follow-up immediately for any recurrent pain, fever, vomiting  #2 chronic fatigue -Check labs above and also include TSH  #3 chronic dyspnea.  Former smoker.  Recent chest x-ray no acute findings.  -We again discussed possible pulmonary referral but at this point he is undecided.    No orders of the defined types were placed in this encounter.   Follow-up: No follow-ups on file.    Carolann Littler,  MD

## 2020-03-28 LAB — CBC WITH DIFFERENTIAL/PLATELET
Absolute Monocytes: 831 cells/uL (ref 200–950)
Basophils Absolute: 50 cells/uL (ref 0–200)
Basophils Relative: 0.7 %
Eosinophils Absolute: 142 cells/uL (ref 15–500)
Eosinophils Relative: 2 %
HCT: 44.6 % (ref 38.5–50.0)
Hemoglobin: 15.4 g/dL (ref 13.2–17.1)
Lymphs Abs: 1527 cells/uL (ref 850–3900)
MCH: 33.7 pg — ABNORMAL HIGH (ref 27.0–33.0)
MCHC: 34.5 g/dL (ref 32.0–36.0)
MCV: 97.6 fL (ref 80.0–100.0)
MPV: 12.2 fL (ref 7.5–12.5)
Monocytes Relative: 11.7 %
Neutro Abs: 4551 cells/uL (ref 1500–7800)
Neutrophils Relative %: 64.1 %
Platelets: 222 10*3/uL (ref 140–400)
RBC: 4.57 10*6/uL (ref 4.20–5.80)
RDW: 13.2 % (ref 11.0–15.0)
Total Lymphocyte: 21.5 %
WBC: 7.1 10*3/uL (ref 3.8–10.8)

## 2020-03-28 LAB — COMPREHENSIVE METABOLIC PANEL
AG Ratio: 1.5 (calc) (ref 1.0–2.5)
ALT: 7 U/L — ABNORMAL LOW (ref 9–46)
AST: 14 U/L (ref 10–35)
Albumin: 4 g/dL (ref 3.6–5.1)
Alkaline phosphatase (APISO): 92 U/L (ref 35–144)
BUN/Creatinine Ratio: 14 (calc) (ref 6–22)
BUN: 22 mg/dL (ref 7–25)
CO2: 23 mmol/L (ref 20–32)
Calcium: 9 mg/dL (ref 8.6–10.3)
Chloride: 106 mmol/L (ref 98–110)
Creat: 1.53 mg/dL — ABNORMAL HIGH (ref 0.70–1.11)
Globulin: 2.6 g/dL (calc) (ref 1.9–3.7)
Glucose, Bld: 128 mg/dL — ABNORMAL HIGH (ref 65–99)
Potassium: 4.4 mmol/L (ref 3.5–5.3)
Sodium: 138 mmol/L (ref 135–146)
Total Bilirubin: 0.5 mg/dL (ref 0.2–1.2)
Total Protein: 6.6 g/dL (ref 6.1–8.1)

## 2020-03-28 LAB — TSH: TSH: 2.32 mIU/L (ref 0.40–4.50)

## 2020-03-28 LAB — LIPASE: Lipase: 11 U/L (ref 7–60)

## 2020-03-28 NOTE — Addendum Note (Signed)
Addended by: [name] on: 03/28/2020 11:57 AM   Modules accepted: Orders

## 2020-04-11 ENCOUNTER — Ambulatory Visit
Admission: RE | Admit: 2020-04-11 | Discharge: 2020-04-11 | Disposition: A | Discharge status: Home / Self Care | Payer: 59 | Source: Ambulatory Visit | Attending: Family Medicine | Admitting: Family Medicine

## 2020-04-11 DIAGNOSIS — R1011 Right upper quadrant pain: Secondary | ICD-10-CM

## 2020-04-11 DIAGNOSIS — K7689 Other specified diseases of liver: Secondary | ICD-10-CM | POA: Diagnosis not present

## 2020-04-11 DIAGNOSIS — R1010 Upper abdominal pain, unspecified: Secondary | ICD-10-CM | POA: Diagnosis not present

## 2020-04-13 ENCOUNTER — Other Ambulatory Visit: Payer: Self-pay

## 2020-04-13 DIAGNOSIS — N1831 Chronic kidney disease, stage 3a: Secondary | ICD-10-CM

## 2020-04-13 DIAGNOSIS — I10 Essential (primary) hypertension: Secondary | ICD-10-CM

## 2020-04-17 ENCOUNTER — Telehealth: Payer: Self-pay

## 2020-04-17 NOTE — Chronic Care Management (AMB) (Signed)
Chronic Care Management Pharmacy  Name: [name]  MRN: [id number] DOB: [birth year]  Chief Complaint/ HPI  [name],  86 y.o. , male presents for their Initial CCM visit with the clinical pharmacist In office. Patient presents today with [name], his wife who also helps him manage his health and medications.   PCP : Steven Post, MD Patient Care Team: Steven Post, MD as PCP - General Steven Margarita, MD as PCP - Cardiology (Cardiology) [name], [facility] as Pharmacist (Pharmacist)  Their chronic conditions include: Hypertension, Hyperlipidemia, Atrial Fibrillation, GERD, Chronic Kidney Disease, BPH and Insomnia, Prediabetes   Office Visits: 03/27/20: Patient presented to Dr. Elease Holder for fatigue. No medication changes made. Patient still contemplating pulmonary referral 01/03/20: Patient presented to Dr. Elease Holder for Dyspnea. EKG showed normal findings.   Consult Visit: 11/03/19: Patient presented to Dr. Radford Holder for A-Fib Follow-up. Gabapentin decreased to 100 mg QHS. Patient in normal sinus rhythm.   Allergies  Allergen Reactions  . Amoxicillin-Pot Clavulanate Diarrhea and Nausea Only  . Losartan Other (See Comments) and Nausea And Vomiting    Elevated creatinine levels Elevated creatinine levels    Medications: Outpatient Encounter Medications as of 04/18/2020  Medication Sig  . acetaminophen (TYLENOL) 500 MG tablet Take 500 mg by mouth every 6 (six) hours as needed for mild pain.  Marland Kitchen amLODipine (NORVASC) 2.5 MG tablet TAKE 1 TABLET EVERY DAY  . apixaban (ELIQUIS) 2.5 MG TABS tablet Take 1 tablet (2.5 mg total) by mouth 2 (two) times daily.  . budesonide-formoterol (SYMBICORT) 160-4.5 MCG/ACT inhaler Inhale 2 puffs into the lungs 2 (two) times daily.  . clobetasol ointment (TEMOVATE) 9.24 % Apply 1 application topically 2 (two) times daily as needed (Rash).   . furosemide (LASIX) 20 MG tablet Take 1 tablet (20 mg total) by mouth daily as needed  for fluid.  Marland Kitchen gabapentin (NEURONTIN) 100 MG capsule Take 100 mg by mouth at bedtime.  Marland Kitchen glucosamine-chondroitin 500-400 MG tablet Take 2 tablets by mouth daily.  . lansoprazole (PREVACID) 30 MG capsule Take 1 capsule (30 mg total) by mouth daily at 12 noon.  . lovastatin (MEVACOR) 20 MG tablet TAKE 1 TABLET (20 MG TOTAL) BY MOUTH AT BEDTIME.  . metoprolol succinate (TOPROL-XL) 25 MG 24 hr tablet TAKE 1 TABLET EVERY DAY  . metroNIDAZOLE (METROCREAM) 0.75 % cream APPLY TOPICALLY 3 TIMES A  WEEK ON THE DAYS HE SHAVES  . tamsulosin (FLOMAX) 0.4 MG CAPS capsule Take 1 capsule (0.4 mg total) by mouth daily.  . vitamin B-12 (CYANOCOBALAMIN) 500 MCG tablet Take 500 mcg by mouth daily.  Marland Kitchen VITAMIN D PO Take 400 Units by mouth daily.   . nitroGLYCERIN (NITROSTAT) 0.4 MG SL tablet Place 1 tablet (0.4 mg total) under the tongue every 5 (five) minutes x 3 doses as needed for chest pain.   No facility-administered encounter medications on file as of 04/18/2020.   Current Diagnosis/Assessment:  SDOH Interventions     Most Recent Value  SDOH Interventions  Financial Strain Interventions Intervention Not Indicated  Transportation Interventions Intervention Not Indicated      Goals Addressed            This Visit's Progress   . Chronic Care Management       CARE PLAN ENTRY (see longitudinal plan of care for additional care plan information)  Current Barriers:  . Chronic Disease Management support, education, and care coordination needs related to Hypertension, Hyperlipidemia, Atrial Fibrillation, GERD, Chronic Kidney Disease,  BPH and Insomnia, Prediabetes    Hypertension BP Readings from Last 3 Encounters:  03/27/20 110/70  01/03/20 (!) 144/72  11/03/19 128/78   . Pharmacist Clinical Goal(s): o Over the next 90 days, patient will work with PharmD and providers to maintain BP goal <140/90 . Current regimen:  . Amlodipine 2.5 mg daily  . Furosemide 20 mg daily as needed   . Metoprolol XL 25  mg daily  . Interventions: o Discussed low salt diet and exercising as tolerated extensively . Patient self care activities - Over the next 90 days, patient will: o Check Blood pressure every 1-2 weeks, document, and provide at future appointments o Ensure daily salt intake < 2300 mg/day  Hyperlipidemia Lab Results  Component Value Date/Time   LDLCALC 87 04/08/2019 03:58 AM   LDLDIRECT 79.3 09/30/2013 10:38 AM   . Pharmacist Clinical Goal(s): o Over the next 90 days, patient will work with PharmD and providers to achieve LDL goal < 100 . Current regimen:  o Lovastatin 20 mg daily  . Interventions: o Discussed low cholesterol diet and exercising as tolerated extensively  Osteoarthritis . Pharmacist Clinical Goal(s) o Over the next 90 days, patient will work with PharmD and providers to maintain quality of life and mobility . Current regimen:  o Acetaminophen 500 mg as needed . Interventions: o Recommend starting acetaminophen 650 mg CR one tablet twice daily o Counseled to avoid taking with other acetaminophen products or alcohol o Counseled to avoid taking greater than 3000 mg of acetaminophen daily  Medication management . Pharmacist Clinical Goal(s): o Over the next 90 days, patient will work with PharmD and providers to maintain optimal medication adherence . Current pharmacy: [facility] . Interventions o Comprehensive medication review performed. o Continue current medication management strategy . Patient self care activities - Over the next 90 days, patient will: o Take medications as prescribed o Report any questions or concerns to PharmD and/or provider(s)      AFIB   Patient is currently rate controlled.  Patient has failed these meds in past: n/a Patient is currently controlled on the  following medications:  . Apixaban 2.5 mg BID . Metoprolol XL 25 mg daily   We discussed: Heart rate stays in the 60s. Heart rate has felt stable and regular  rhythm since patient's last ablation in 2011. Patient bruises with eliquis, but otherwise no signs of unusual bleeding.   Plan  Continue current medications  Hypertension   BP goal is:  <140/90  Office blood pressures are  BP Readings from Last 3 Encounters:  03/27/20 110/70  01/03/20 (!) 144/72  11/03/19 128/78   CMP Latest Ref Rng & Units 03/27/2020 11/03/2019 07/05/2019  Glucose 65 - 99 mg/dL 128(H) 111(H) 152(H)  BUN 7 - 25 mg/dL $Remove'22 20 19  'MmYBqzp$ Creatinine 0.70 - 1.11 mg/dL 1.53(H) 1.56(H) 1.64(H)  Sodium 135 - 146 mmol/L 138 139 140  Potassium 3.5 - 5.3 mmol/L 4.4 4.4 4.5  Chloride 98 - 110 mmol/L 106 103 104  CO2 20 - 32 mmol/L 23 21 17(L)  Calcium 8.6 - 10.3 mg/dL 9.0 9.2 9.4  Total Protein 6.1 - 8.1 g/dL 6.6 - -  Total Bilirubin 0.2 - 1.2 mg/dL 0.5 - -  Alkaline Phos 38 - 126 U/L - - -  AST 10 - 35 U/L 14 - -  ALT 9 - 46 U/L 7(L) - -   Patient checks BP at home several times per month Patient home BP readings are ranging:  131/60   Patient has failed these meds in the past: n/a Patient is currently controlled on the following medications:  . Amlodipine 2.5 mg daily  . Furosemide 20 mg daily PRN  . Metoprolol XL 25 mg daily   We discussed diet and exercise extensively. Patient has low appetite and does not follow any particular dietary regimen. Mainly eats sandwiches for lunch, snacks of peanut butter and crackers, and will have a light dinner with meat. Rarely eats vegetables but will eat fruit.   Patient has occasional dizziness spells but previously when they had checked his blood pressure during these times it was stable. Counseled patient on avoiding rapid postural changes and to stay hydrated throughout the day.    Plan  Continue current medications   Hyperlipidemia   LDL goal < 100  Lipid Panel     Component Value Date/Time   CHOL 128 04/08/2019 0358   TRIG 66 04/08/2019 0358   HDL 28 (L) 04/08/2019 0358   LDLCALC 87 04/08/2019 0358   LDLDIRECT 79.3  09/30/2013 1038    Hepatic Function Latest Ref Rng & Units 03/27/2020 04/07/2019 03/16/2019  Total Protein 6.1 - 8.1 g/dL 6.6 6.7 6.6  Albumin 3.5 - 5.0 g/dL - 3.9 4.3  AST 10 - 35 U/L $Remo'14 19 17  'bXGAq$ ALT 9 - 46 U/L 7(L) 13 13  Alk Phosphatase 38 - 126 U/L - 84 79  Total Bilirubin 0.2 - 1.2 mg/dL 0.5 0.6 0.8  Bilirubin, Direct 0.0 - 0.3 mg/dL - - 0.2     The ASCVD Risk score ([place]., et al., 2013) failed to calculate for the following reasons:   The 2013 ASCVD risk score is only valid for ages 14 to 31   Patient has failed these meds in past: n/a Patient is currently controlled on the following medications:  . Lovastatin 20 mg QHS   We discussed:  diet and exercise extensively  Plan  Continue current medications  BPH   No results found for: PSA   Patient has failed these meds in past: n/a Patient is currently uncontrolled on the following medications:  . Tamsulosin 0.4 mg daily   We discussed:  Patient still wakes up 3-4 times nightly, feels like it may have worsened recently.    Plan  Recommend PSA.   Diabetes   A1c goal <6.5%  Recent Relevant Labs: Lab Results  Component Value Date/Time   HGBA1C 5.7 (H) 04/07/2019 07:17 PM   HGBA1C 6.0 10/28/2016 11:25 AM   GFR 49.80 (L) 03/16/2019 09:12 AM   GFR 50.93 (L) 11/03/2017 10:18 AM    Last diabetic Eye exam:  Lab Results  Component Value Date/Time   HMDIABEYEEXA No Retinopathy 12/15/2017 12:00 AM    Last diabetic Foot exam: No results found for: HMDIABFOOTEX   Patient has failed these meds in past: n/a Patient is currently controlled on the following medications: . none  We discussed: diet and exercise extensively  Plan  Continue control with diet and exercise  GERD   Patient has failed these meds in past: n/a Patient is currently controlled on the following medications:  . Lansoprazole 30 mg daily at noon   We discussed:  GERD symptoms well controlled on lansoprazole, but patient still with frequent,  uncomfortable flatulence.   Plan  Continue current medications  Chronic Pain   Osteoarthritis Spinal Stenosis   Patient has failed these meds in past: n/a Patient is currently uncontrolled on the following medications:  . APAP 500 mg q6hr  PRN  . Gabapentin 100 mg QHS . Glucosamine-Chondroitin 2 tablets twice daily. We discussed:  Patient hesitant to try injections to help back pain. Patient reports problems with oversedation with higher doses of gabapentin, but will sometimes take 200-300 mg if his back pain is very severe, although he states he hasn't done this in quite a while. Has been using glucosamine for 2-3 weeks, has noticed not as much pain after walking to his mailbox and back.   Plan  Recommend Tylenol Arthritis CR 650 mg twice daily. Counseled to avoid >3000 mg acetaminophen daily.   Misc / OTC    . Symbicort 160-4.5 mcg/act 2 puff BID  . Clobetasol 0.05% ointment twice daily PRN  . Metronidazole 0.75% cream three times weekly  . Nitroglycerin 0.4 mg SL  . Vitamin B12 500 mcg daily  . Vitamin D 400 units daily  Plan  Recommend increasing vitamin D to 1000 units daily (recommended [phone] units daily)  Vaccines   Reviewed and discussed patient's vaccination history.    Immunization History  Administered Date(s) Administered  . Fluad Quad(high Dose 65+) 05/03/2019  . Influenza Split 08/06/2011, 07/01/2012  . Influenza Whole 06/06/2009, 05/07/2010  . Influenza, High Dose Seasonal PF 07/06/2013, 06/22/2015, 04/25/2016, 08/05/2017, 07/20/2018  . Influenza,inj,Quad PF,6+ Mos 05/24/2014  . PFIZER SARS-COV-2 Vaccination 08/30/2019, 09/30/2019  . Pneumococcal Conjugate-13 12/22/2014  . Pneumococcal Polysaccharide-23 10/16/2011  . Td 08/29/2010  . Zoster 08/21/2014   Medication Management   Pt uses [facility] for all medications Uses pill box? No.  Pt endorses 100% compliance  Plan  Continue current medication management  strategy  Follow up: 12 month phone visit  [place] Pharmacist [facility] [place]

## 2020-04-17 NOTE — Progress Notes (Addendum)
Chronic Care Management Pharmacy Assistant   Name: [name]  MRN: [id number] DOB: 31-Dec-1931  Reason for Encounter: Medication Review / initial question for initial visit with clinical pharmacist.   PCP : [name], MD  Allergies:   Allergies  Allergen Reactions   Amoxicillin-Pot Clavulanate Diarrhea and Nausea Only   Losartan Other (See Comments) and Nausea And Vomiting    Elevated creatinine levels Elevated creatinine levels    Medications: Outpatient Encounter Medications as of 04/17/2020  Medication Sig   acetaminophen (TYLENOL) 500 MG tablet Take 500 mg by mouth every 6 (six) hours as needed for mild pain.   amLODipine (NORVASC) 2.5 MG tablet TAKE 1 TABLET EVERY DAY   apixaban (ELIQUIS) 2.5 MG TABS tablet Take 1 tablet (2.5 mg total) by mouth 2 (two) times daily.   budesonide-formoterol (SYMBICORT) 160-4.5 MCG/ACT inhaler Inhale 2 puffs into the lungs 2 (two) times daily.   clobetasol ointment (TEMOVATE) 9.24 % Apply 1 application topically 2 (two) times daily as needed (Rash).    furosemide (LASIX) 20 MG tablet Take 1 tablet (20 mg total) by mouth daily as needed for fluid.   gabapentin (NEURONTIN) 100 MG capsule Take 100 mg by mouth at bedtime.   lansoprazole (PREVACID) 30 MG capsule Take 1 capsule (30 mg total) by mouth daily at 12 noon.   lovastatin (MEVACOR) 20 MG tablet TAKE 1 TABLET (20 MG TOTAL) BY MOUTH AT BEDTIME.   metoprolol succinate (TOPROL-XL) 25 MG 24 hr tablet TAKE 1 TABLET EVERY DAY   metroNIDAZOLE (METROCREAM) 0.75 % cream APPLY TOPICALLY 3 TIMES A  WEEK ON THE DAYS HE SHAVES   nitroGLYCERIN (NITROSTAT) 0.4 MG SL tablet Place 1 tablet (0.4 mg total) under the tongue every 5 (five) minutes x 3 doses as needed for chest pain.   tamsulosin (FLOMAX) 0.4 MG CAPS capsule Take 1 capsule (0.4 mg total) by mouth daily.   vitamin B-12 (CYANOCOBALAMIN) 500 MCG tablet Take 500 mcg by mouth daily.   VITAMIN D PO Take 1 capsule by mouth daily.   No  facility-administered encounter medications on file as of 04/17/2020.    Current Diagnosis: Patient Active Problem List   Diagnosis Date Noted   Ventricular bigeminy 04/08/2019   Unstable angina ([place]) 04/07/2019   Right sciatic nerve pain 07/18/2015   Recurrent vertigo 07/18/2015   BPH (benign prostatic hyperplasia) 06/22/2015   CKD (chronic kidney disease) stage 3, GFR 30-59 ml/min 12/22/2014   Lumbar stenosis with neurogenic claudication 04/20/2014   Spinal stenosis of lumbar region 12/15/2013   Need for shingles vaccine 08/31/2012   Prediabetes 10/16/2011   CARCINOMA, SKIN, SQUAMOUS CELL 09/11/2009   INSOMNIA, CHRONIC 09/11/2009   PULMONARY NODULE 09/11/2009   ATRIAL FIBRILLATION 04/07/2009   BENIGN PROSTATIC HYPERTROPHY, HX OF 04/07/2009   POLYPECTOMY, HX OF 04/07/2009   Hyperlipidemia 03/13/2009   Essential hypertension 03/13/2009   GERD 03/13/2009   ROSACEA 03/13/2009   ACTINIC KERATOSIS 03/13/2009   SKIN CANCER, HX OF 03/13/2009   COLONIC POLYPS, HX OF 03/13/2009    Follow-Up:  Pharmacist Review   Have you seen any other providers since your last visit? no Any changes in your medications or health? no Any side effects from any medications?  Patient's wife states patient may be having some side effects but is unsure at this time. Do you have an symptoms or problems not managed by your medications? no Any concerns about your health right now? Yes  Patient's wife states patient is having stomach  issue as in diarrhea, and constipation.Wife states patient had a ultrasound of his stomach resulted in gas.  Patient's wife reported patient has chest pain related to GERD.   Patient's wife stated patient is having back pain,Shortness of breath, lightheadedness and sometimes dizziness.  Has your provider asked that you check blood pressure, blood sugar, or follow special diet at home? Yes   Patient wife stated patient checks his blood pressure once weekly. Patient blood  pressure was around "130's/50's last week I am  unsure of the day his blood pressure was taken".   Do you get any type of exercise on a regular basis? No  Patient wife reported patient walks to mail box but is unable to do more exercise or stand for a long period of time due to his back pain. Can you think of a goal you would like to reach for your health?   Patient wife stated he would like to reduce or have some relief with current  back pain. Do you have any problems getting your medications?   Patient wife reported in the past having difficulties with affording Eliquis but they changed their insurance. Patient is able to afford Eliquis with their current plan.  Is there anything that you would like to discuss during the appointment?   Back pain, lightheadedness, dizziness,Shortness of breath,  Please bring medications and supplements to appointment.  Patient Verbalized Understanding   [place] [place] Pharmacist Assistant [phone]

## 2020-04-18 ENCOUNTER — Ambulatory Visit: Payer: Medicare PPO

## 2020-04-18 ENCOUNTER — Other Ambulatory Visit: Payer: Self-pay

## 2020-04-18 DIAGNOSIS — I10 Essential (primary) hypertension: Secondary | ICD-10-CM

## 2020-04-18 DIAGNOSIS — E7800 Pure hypercholesterolemia, unspecified: Secondary | ICD-10-CM

## 2020-04-18 NOTE — Patient Instructions (Signed)
Visit Information It was great speaking with you today!  Please let me know if you have any questions about our visit.  Goals Addressed            This Visit's Progress   . Chronic Care Management       CARE PLAN ENTRY (see longitudinal plan of care for additional care plan information)  Current Barriers:  . Chronic Disease Management support, education, and care coordination needs related to Hypertension, Hyperlipidemia, Atrial Fibrillation, GERD, Chronic Kidney Disease, BPH and Insomnia, Prediabetes    Hypertension BP Readings from Last 3 Encounters:  03/27/20 110/70  01/03/20 (!) 144/72  11/03/19 128/78   . Pharmacist Clinical Goal(s): o Over the next 90 days, patient will work with PharmD and providers to maintain BP goal <140/90 . Current regimen:  . Amlodipine 2.5 mg daily  . Furosemide 20 mg daily as needed   . Metoprolol XL 25 mg daily  . Interventions: o Discussed low salt diet and exercising as tolerated extensively . Patient self care activities - Over the next 90 days, patient will: o Check Blood pressure every 1-2 weeks, document, and provide at future appointments o Ensure daily salt intake < 2300 mg/day  Hyperlipidemia Lab Results  Component Value Date/Time   LDLCALC 87 04/08/2019 03:58 AM   LDLDIRECT 79.3 09/30/2013 10:38 AM   . Pharmacist Clinical Goal(s): o Over the next 90 days, patient will work with PharmD and providers to achieve LDL goal < 100 . Current regimen:  o Lovastatin 20 mg daily  . Interventions: o Discussed low cholesterol diet and exercising as tolerated extensively  Osteoarthritis . Pharmacist Clinical Goal(s) o Over the next 90 days, patient will work with PharmD and providers to maintain quality of life and mobility . Current regimen:  o Acetaminophen 500 mg as needed . Interventions: o Recommend starting acetaminophen 650 mg CR one tablet twice daily o Counseled to avoid taking with other acetaminophen products or  alcohol o Counseled to avoid taking greater than 3000 mg of acetaminophen daily  Medication management . Pharmacist Clinical Goal(s): o Over the next 90 days, patient will work with PharmD and providers to maintain optimal medication adherence . Current pharmacy: [facility] . Interventions o Comprehensive medication review performed. o Continue current medication management strategy . Patient self care activities - Over the next 90 days, patient will: o Take medications as prescribed o Report any questions or concerns to PharmD and/or provider(s)       Mr. Woolever was given information about Chronic Care Management services today including:  1. CCM service includes personalized support from designated clinical staff supervised by his physician, including individualized plan of care and coordination with other care providers 2. 24/7 contact phone numbers for assistance for urgent and routine care needs. 3. Standard insurance, coinsurance, copays and deductibles apply for chronic care management only during months in which we provide at least 20 minutes of these services. Most insurances cover these services at 100%, however patients may be responsible for any copay, coinsurance and/or deductible if applicable. This service may help you avoid the need for more expensive face-to-face services. 4. Only one practitioner may furnish and bill the service in a calendar month. 5. The patient may stop CCM services at any time (effective at the end of the month) by phone call to the office staff.  Patient agreed to services and verbal consent obtained.   The patient verbalized understanding of instructions provided today and agreed to receive  a mailed copy of patient instruction and/or educational materials. Face to Face appointment with pharmacist scheduled for: 04/09/20 at 9:00 AM   [facility] [place]

## 2020-05-28 ENCOUNTER — Encounter: Payer: Self-pay | Admitting: Family Medicine

## 2020-05-28 DIAGNOSIS — H04123 Dry eye syndrome of bilateral lacrimal glands: Secondary | ICD-10-CM | POA: Diagnosis not present

## 2020-05-28 DIAGNOSIS — H40013 Open angle with borderline findings, low risk, bilateral: Secondary | ICD-10-CM | POA: Diagnosis not present

## 2020-05-28 DIAGNOSIS — Z961 Presence of intraocular lens: Secondary | ICD-10-CM | POA: Diagnosis not present

## 2020-05-28 DIAGNOSIS — H524 Presbyopia: Secondary | ICD-10-CM | POA: Diagnosis not present

## 2020-05-31 ENCOUNTER — Ambulatory Visit (INDEPENDENT_AMBULATORY_CARE_PROVIDER_SITE_OTHER): Payer: Medicare PPO | Admitting: *Deleted

## 2020-05-31 ENCOUNTER — Other Ambulatory Visit: Payer: Self-pay

## 2020-05-31 DIAGNOSIS — Z85828 Personal history of other malignant neoplasm of skin: Secondary | ICD-10-CM | POA: Diagnosis not present

## 2020-05-31 DIAGNOSIS — D485 Neoplasm of uncertain behavior of skin: Secondary | ICD-10-CM | POA: Diagnosis not present

## 2020-05-31 DIAGNOSIS — Z23 Encounter for immunization: Secondary | ICD-10-CM

## 2020-05-31 DIAGNOSIS — C44629 Squamous cell carcinoma of skin of left upper limb, including shoulder: Secondary | ICD-10-CM | POA: Diagnosis not present

## 2020-05-31 DIAGNOSIS — C4442 Squamous cell carcinoma of skin of scalp and neck: Secondary | ICD-10-CM | POA: Diagnosis not present

## 2020-05-31 DIAGNOSIS — C4441 Basal cell carcinoma of skin of scalp and neck: Secondary | ICD-10-CM | POA: Diagnosis not present

## 2020-05-31 DIAGNOSIS — L570 Actinic keratosis: Secondary | ICD-10-CM | POA: Diagnosis not present

## 2020-05-31 DIAGNOSIS — L905 Scar conditions and fibrosis of skin: Secondary | ICD-10-CM | POA: Diagnosis not present

## 2020-05-31 DIAGNOSIS — Z85820 Personal history of malignant melanoma of skin: Secondary | ICD-10-CM | POA: Diagnosis not present

## 2020-06-06 DIAGNOSIS — C44219 Basal cell carcinoma of skin of left ear and external auricular canal: Secondary | ICD-10-CM | POA: Diagnosis not present

## 2020-06-06 DIAGNOSIS — C44629 Squamous cell carcinoma of skin of left upper limb, including shoulder: Secondary | ICD-10-CM | POA: Diagnosis not present

## 2020-06-06 DIAGNOSIS — D044 Carcinoma in situ of skin of scalp and neck: Secondary | ICD-10-CM | POA: Diagnosis not present

## 2020-06-11 ENCOUNTER — Other Ambulatory Visit: Payer: Self-pay | Admitting: Family Medicine

## 2020-06-14 ENCOUNTER — Other Ambulatory Visit: Payer: Self-pay | Admitting: Cardiology

## 2020-06-18 ENCOUNTER — Telehealth: Payer: Self-pay

## 2020-06-18 NOTE — Progress Notes (Signed)
I have attempted without success to contact this patient by phone three times to do his General Adherence call. I left a Voice message for patient to return my call.  [facility] [facility] Pharmacist Assistant [phone]

## 2020-06-22 ENCOUNTER — Other Ambulatory Visit: Payer: Self-pay | Admitting: *Deleted

## 2020-06-22 DIAGNOSIS — I480 Paroxysmal atrial fibrillation: Secondary | ICD-10-CM

## 2020-06-22 MED ORDER — APIXABAN 2.5 MG PO TABS: 2.5000 mg | ORAL_TABLET | Freq: Two times a day (BID) | ORAL | 1 refills | Status: DC

## 2020-06-22 NOTE — Telephone Encounter (Signed)
Prescription refill request for Eliquis received. Indication:  A fib Last office visit: 11/03/19 Scr: 1.53 Age: 86 Weight: 92kg

## 2020-06-26 ENCOUNTER — Telehealth: Payer: Self-pay | Admitting: Family Medicine

## 2020-06-26 DIAGNOSIS — C44629 Squamous cell carcinoma of skin of left upper limb, including shoulder: Secondary | ICD-10-CM | POA: Diagnosis not present

## 2020-06-26 DIAGNOSIS — C44219 Basal cell carcinoma of skin of left ear and external auricular canal: Secondary | ICD-10-CM | POA: Diagnosis not present

## 2020-06-26 NOTE — Telephone Encounter (Signed)
No answer unable to leave message for patient to call back and schedule Medicare Annual Wellness Visit (AWV) either virtually or in office.  Last AWV 10/18/2015  Please schedule at any time with [facility] Nurse Health Advisor 1.  This should be a 45 minute visit.

## 2020-07-03 ENCOUNTER — Telehealth: Payer: Self-pay | Admitting: Family Medicine

## 2020-07-03 DIAGNOSIS — R531 Weakness: Secondary | ICD-10-CM

## 2020-07-03 NOTE — Telephone Encounter (Signed)
Wife requesting prescription for lift chair.  He has grown increasingly weak and has chronic back pain and difficulty getting up.

## 2020-07-06 DIAGNOSIS — L905 Scar conditions and fibrosis of skin: Secondary | ICD-10-CM | POA: Diagnosis not present

## 2020-07-06 DIAGNOSIS — D485 Neoplasm of uncertain behavior of skin: Secondary | ICD-10-CM | POA: Diagnosis not present

## 2020-07-06 DIAGNOSIS — C44229 Squamous cell carcinoma of skin of left ear and external auricular canal: Secondary | ICD-10-CM | POA: Diagnosis not present

## 2020-07-06 DIAGNOSIS — C44629 Squamous cell carcinoma of skin of left upper limb, including shoulder: Secondary | ICD-10-CM | POA: Diagnosis not present

## 2020-07-27 ENCOUNTER — Other Ambulatory Visit: Payer: Self-pay | Admitting: Family Medicine

## 2020-07-30 NOTE — Telephone Encounter (Signed)
May refill gabapentin for 6 months

## 2020-09-04 ENCOUNTER — Encounter: Payer: Self-pay | Admitting: Family Medicine

## 2020-09-04 ENCOUNTER — Ambulatory Visit (INDEPENDENT_AMBULATORY_CARE_PROVIDER_SITE_OTHER): Payer: Medicare PPO | Admitting: Family Medicine

## 2020-09-04 ENCOUNTER — Other Ambulatory Visit: Payer: Self-pay

## 2020-09-04 VITALS — BP 148/70 | HR 70 | Ht 74.0 in | Wt 208.0 lb

## 2020-09-04 DIAGNOSIS — M5431 Sciatica, right side: Secondary | ICD-10-CM

## 2020-09-04 DIAGNOSIS — M48061 Spinal stenosis, lumbar region without neurogenic claudication: Secondary | ICD-10-CM

## 2020-09-04 DIAGNOSIS — M48062 Spinal stenosis, lumbar region with neurogenic claudication: Secondary | ICD-10-CM

## 2020-09-04 MED ORDER — PREDNISONE 10 MG PO TABS: ORAL_TABLET | ORAL | 0 refills | Status: DC

## 2020-09-04 MED ORDER — TRAMADOL HCL 50 MG PO TABS
50.0000 mg | ORAL_TABLET | Freq: Three times a day (TID) | ORAL | 0 refills | Status: AC | PRN
Start: 2020-09-04 — End: 2020-09-09

## 2020-09-04 NOTE — Patient Instructions (Signed)
[employer] neurological surgery (7th ed., pp. 2373-2383). [place], [place]: [employer]."> [employer] neurological surgery (7th ed., pp. 2344-2347). [place], [place]: [employer].">  Spinal Stenosis  Spinal stenosis is a condition that happens when the spinal canal narrows. The spinal canal is the space between the bones of your spine (vertebrae). This narrowing puts pressure on the spinal cord or nerves. Spinal stenosis can affect the vertebrae in the neck, upper back, and lower back. This condition can range from mild to severe. In some cases, there are no symptoms. What are the causes? This condition is caused by areas of bone pushing into the spinal canal. This condition may be present at birth (congenital), or it may be caused by:  Slow breakdown of your vertebrae (spinal degeneration). This usually starts around 85 years of age.  Injury (trauma) to your spine.  Tumors in your spine.  Calcium deposits in your spine. What increases the risk? The following factors may make you more likely to develop this condition:  Being older than age 50.  Having a problem present at birth with an abnormally shaped spine (congenitalspinal deformity), such as scoliosis.  Having arthritis. What are the signs or symptoms? Symptoms of this condition include:  Pain in the neck or back that is generally worse with activities, particularly when you stand or walk.  Numbness, tingling, hot or cold sensations, weakness, or tiredness (fatigue) in your leg or legs.  Pain going from the buttock, down the thigh, and to the calf (sciatica). This can happen in one or both legs.  Frequent episodes of falling.  A foot-slapping gait that leads to muscle weakness. In more severe cases, you may develop:  Problems having a bowel movement or urinating.  Difficulty having sex.  Loss of feeling in your legs and inability to walk. Symptoms may come on slowly and get worse over time. In some cases, there  are no symptoms. How is this diagnosed? This condition is diagnosed based on your medical history and a physical exam. You will also have tests, such as an MRI, a CT scan, or an X-ray. How is this treated? Treatment for this condition often focuses on managing your pain and any other symptoms. Treatment may include:  Practicing good posture to lessen pressure on your nerves.  Exercising to strengthen muscles, build endurance, improve balance, and maintain range of motion. This may include physical therapy to restore movement and strength to your back.  Losing weight, if needed.  Medicines to reduce inflammation or pain. This may include a medicine that is injected into your spine (steroidinjection).  Assistive devices, such as a corset or brace. In some cases, surgery may be needed. The most common procedure is decompression laminectomy. This is done to remove excess bone that puts pressure on your nerve roots. Follow these instructions at home: Managing pain, stiffness, and swelling  Practice good posture. If you were given a brace or a corset, wear it as told by your health care provider.  Maintain a healthy weight. Talk with your health care provider if you need help losing weight.  If directed, apply heat to the affected area as often as told by your health care provider. Use the heat source that your health care provider recommends, such as a moist heat pack or a heating pad. ? Place a towel between your skin and the heat source. ? Leave the heat on for 20-30 minutes. ? Remove the heat if your skin turns bright red. This is especially important if   you are unable to feel pain, heat, or cold. You may have a greater risk of getting burned.   Activity  Do all exercises and stretches as told by your health care provider.  Do not do any activities that cause pain. Ask your health care provider what activities are safe for you.  Do not lift anything that is heavier than 10 lb (4.5 kg),  or the limit that you are told by your health care provider.  Return to your normal activities as told by your health care provider. Ask your health care provider what activities are safe for you. General instructions  Take over-the-counter and prescription medicines only as told by your health care provider.  Do not use any products that contain nicotine or tobacco, such as cigarettes, e-cigarettes, and chewing tobacco. If you need help quitting, ask your health care provider.  Eat a healthy diet. This includes plenty of fruits and vegetables, whole grains, and low-fat (lean) protein.  Keep all follow-up visits as told by your health care provider. This is important. Contact a health care provider if:  Your symptoms do not get better or they get worse.  You have a fever. Get help right away if:  You have new pain or symptoms of severe pain, such as: ? New or worsening pain in your neck or upper back. ? Severe pain that cannot be controlled with medicines. ? A severe headache that gets worse when you stand.  You are dizzy.  You have vision problems, such as blurred vision or double vision.  You have nausea or you vomit.  You develop new or worsening numbness or tingling in your back or legs.  You have pain, redness, swelling, or warmth in your arm or leg. Summary  Spinal stenosis is a condition that happens when the spinal canal narrows. The spinal canal is the space between the bones of your spine (vertebrae). This narrowing puts pressure on the spinal cord or nerves.  This condition may be caused by a birth defect, breakdown of your vertebrae, trauma, tumors, or calcium deposits.  Spinal stenosis can cause numbness, weakness, or pain in the buttocks, neck, back, and legs.  This condition is usually diagnosed with your medical history, a physical exam, and tests, such as an MRI, a CT scan, or an X-ray. This information is not intended to replace advice given to you by your  health care provider. Make sure you discuss any questions you have with your health care provider. Document Revised: 06/09/2019 Document Reviewed: 06/09/2019 Elsevier Patient Education  2021 [employer]  

## 2020-09-04 NOTE — Progress Notes (Signed)
Established Patient Office Visit  Subjective:  Patient ID: [name], male    DOB: [birth year]  Age: 87 y.o. MRN: [id number]  CC:  Chief Complaint  Patient presents with  . Leg Problem    HPI [name] presents for progressive low back pain and radiation to the right lower extremity.  Known history of spinal stenosis.  He had surgery for severe spinal stenosis about 5 or 6 years ago.  He describes sharp pain which radiates from his right lower back down to the foot at times.  Worse with position change.  No significant numbness.  No significant weakness.  Pain is moderate to severe at times.  Interfering with sleep.  He has been taking gabapentin up to 300 mg nightly.  This does help with the pain but he has some lightheadedness and dizziness with higher dose gabapentin.  No recent fever.  He has history of hypertension, atrial fibrillation, GERD, chronic kidney disease  Past Medical History:  Diagnosis Date  . Actinic keratosis 03/13/2009  . Arthritis   . BENIGN PROSTATIC HYPERTROPHY, HX OF 04/07/2009  . CARCINOMA, SKIN, SQUAMOUS CELL 09/11/2009  . COLONIC POLYPS, HX OF 03/13/2009  . Essential hypertension   . GERD 03/13/2009  . Hyperlipidemia   . INSOMNIA, CHRONIC 09/11/2009  . Persistent atrial fibrillation (HCC) 04/07/2009   a. s/p afib ablation at [facility] x 2 in 2010.  Marland Kitchen POLYPECTOMY, HX OF 04/07/2009  . Premature atrial contractions   . PULMONARY NODULE 09/11/2009  . PVC's (premature ventricular contractions)   . Rosacea 03/13/2009  . SKIN CANCER, HX OF 03/13/2009    Past Surgical History:  Procedure Laterality Date  . ABLATION  2010   x2  . COLONOSCOPY W/ BIOPSIES AND POLYPECTOMY    . EYE SURGERY Bilateral    cataracts  . LUMBAR DISC SURGERY     RUPTURE  . LUMBAR LAMINECTOMY/DECOMPRESSION MICRODISCECTOMY N/A 04/20/2014   Procedure: LUMBAR TWO-THREE, LUMBAR THREE-FOUR, LUMAR FOUR-FIVE LAMINECTOMIES;  Surgeon: Tressie Stalker, MD;  Location: [facility];  Service:  Neurosurgery;  Laterality: N/A;  . POLYPECTOMY    . SKIN CANCER EXCISION      Family History  Problem Relation Age of Onset  . Cancer Mother   . Cancer Father 83       COLON  . Heart attack Father   . Heart disease Father   . Heart disease Sister   . CAD Sister   . Heart disease Brother   . CAD Brother   . CAD Brother   . Heart disease Brother     Social History   Socioeconomic History  . Marital status: Married    Spouse name: Not on file  . Number of children: Not on file  . Years of education: Not on file  . Highest education level: Not on file  Occupational History  . Not on file  Tobacco Use  . Smoking status: Former Smoker    Packs/day: 1.00    Years: 30.00    Pack years: 30.00    Types: Cigarettes    Quit date: 03/13/1988    Years since quitting: 32.5  . Smokeless tobacco: Current User    Types: Chew  Substance and Sexual Activity  . Alcohol use: No  . Drug use: No  . Sexual activity: Not on file  Other Topics Concern  . Not on file  Social History Narrative  . Not on file   Social Determinants of Health   Financial Resource Strain:  Low Risk   . Difficulty of Paying Living Expenses: Not very hard  Food Insecurity: Not on file  Transportation Needs: No Transportation Needs  . Lack of Transportation (Medical): No  . Lack of Transportation (Non-Medical): No  Physical Activity: Not on file  Stress: Not on file  Social Connections: Not on file  Intimate Partner Violence: Not on file    Outpatient Medications Prior to Visit  Medication Sig Dispense Refill  . gabapentin (NEURONTIN) 100 MG capsule TAKE 1 CAPSULE IN THE MORNING  AND TAKE 2 CAPSULES AT BEDTIME 270 capsule 1  . acetaminophen (TYLENOL) 500 MG tablet Take 500 mg by mouth every 6 (six) hours as needed for mild pain.    Marland Kitchen amLODipine (NORVASC) 2.5 MG tablet TAKE 1 TABLET EVERY DAY 90 tablet 1  . apixaban (ELIQUIS) 2.5 MG TABS tablet Take 1 tablet (2.5 mg total) by mouth 2 (two) times daily.  180 tablet 1  . clobetasol ointment (TEMOVATE) 5.46 % Apply 1 application topically 2 (two) times daily as needed (Rash).     . furosemide (LASIX) 20 MG tablet Take 1 tablet (20 mg total) by mouth daily as needed for fluid.    Marland Kitchen glucosamine-chondroitin 500-400 MG tablet Take 2 tablets by mouth daily.    . lansoprazole (PREVACID) 30 MG capsule TAKE 1 CAPSULE (30 MG TOTAL) BY MOUTH DAILY AT 12 NOON. 90 capsule 2  . lovastatin (MEVACOR) 20 MG tablet TAKE 1 TABLET (20 MG TOTAL) BY MOUTH AT BEDTIME. 90 tablet 1  . metoprolol succinate (TOPROL-XL) 25 MG 24 hr tablet TAKE 1 TABLET EVERY DAY 90 tablet 1  . metroNIDAZOLE (METROCREAM) 0.75 % cream APPLY TOPICALLY 3 TIMES A  WEEK ON THE DAYS HE SHAVES 45 g 1  . nitroGLYCERIN (NITROSTAT) 0.4 MG SL tablet Place 1 tablet (0.4 mg total) under the tongue every 5 (five) minutes x 3 doses as needed for chest pain. 25 tablet 0  . SYMBICORT 160-4.5 MCG/ACT inhaler TAKE 2 PUFFS BY MOUTH TWICE A DAY 30.6 each 3  . tamsulosin (FLOMAX) 0.4 MG CAPS capsule Take 1 capsule (0.4 mg total) by mouth daily. 90 capsule 3  . vitamin B-12 (CYANOCOBALAMIN) 500 MCG tablet Take 500 mcg by mouth daily.    Marland Kitchen VITAMIN D PO Take 400 Units by mouth daily.      No facility-administered medications prior to visit.    Allergies  Allergen Reactions  . Amoxicillin-Pot Clavulanate Diarrhea and Nausea Only  . Losartan Other (See Comments) and Nausea And Vomiting    Elevated creatinine levels Elevated creatinine levels    ROS Review of Systems  Constitutional: Negative for appetite change, chills, fever and unexpected weight change.  Respiratory: Negative for shortness of breath.   Cardiovascular: Negative for chest pain.  Genitourinary: Negative for dysuria.  Musculoskeletal: Positive for back pain.  Neurological: Negative for weakness and numbness.      Objective:    Physical Exam Vitals reviewed.  Constitutional:      Appearance: Normal appearance.  Cardiovascular:      Rate and Rhythm: Normal rate and regular rhythm.  Pulmonary:     Effort: Pulmonary effort is normal.     Breath sounds: Normal breath sounds.  Musculoskeletal:     Right lower leg: No edema.     Left lower leg: No edema.     Comments: Right leg raise are negative.  Neurological:     Mental Status: He is alert.     Comments: Full strength with plantarflexion,  dorsiflexion, and knee extension bilaterally.  He has 1+ ankle reflexes bilaterally.  Slightly diminished reflex right knee compared to left     BP (!) 148/70   Pulse 70   Ht 6\' 2"  (1.88 m)   Wt 208 lb (94.3 kg)   SpO2 98%   BMI 26.71 kg/m  Wt Readings from Last 3 Encounters:  09/04/20 208 lb (94.3 kg)  03/27/20 203 lb 8 oz (92.3 kg)  01/03/20 204 lb 9.6 oz (92.8 kg)     Health Maintenance Due  Topic Date Due  . COVID-19 Vaccine (3 - Pfizer risk 4-dose series) 10/28/2019  . TETANUS/TDAP  08/29/2020    There are no preventive care reminders to display for this patient.  Lab Results  Component Value Date   TSH 2.32 03/27/2020   Lab Results  Component Value Date   WBC 7.1 03/27/2020   HGB 15.4 03/27/2020   HCT 44.6 03/27/2020   MCV 97.6 03/27/2020   PLT 222 03/27/2020   Lab Results  Component Value Date   NA 138 03/27/2020   K 4.4 03/27/2020   CO2 23 03/27/2020   GLUCOSE 128 (H) 03/27/2020   BUN 22 03/27/2020   CREATININE 1.53 (H) 03/27/2020   BILITOT 0.5 03/27/2020   ALKPHOS 84 04/07/2019   AST 14 03/27/2020   ALT 7 (L) 03/27/2020   PROT 6.6 03/27/2020   ALBUMIN 3.9 04/07/2019   CALCIUM 9.0 03/27/2020   ANIONGAP 10 04/07/2019   GFR 49.80 (L) 03/16/2019   Lab Results  Component Value Date   CHOL 128 04/08/2019   Lab Results  Component Value Date   HDL 28 (L) 04/08/2019   Lab Results  Component Value Date   LDLCALC 87 04/08/2019   Lab Results  Component Value Date   TRIG 66 04/08/2019   Lab Results  Component Value Date   CHOLHDL 4.6 04/08/2019   Lab Results  Component Value Date    HGBA1C 5.7 (H) 04/07/2019      Assessment & Plan:   Problem List Items Addressed This Visit      Unprioritized   Lumbar stenosis with neurogenic claudication   Relevant Medications   predniSONE (DELTASONE) 10 MG tablet   traMADol (ULTRAM) 50 MG tablet   Right sciatic nerve pain - Primary    Patient has history of known lumbar stenosis with prior surgery several years ago.  He describes right lumbar radiculitis symptoms with no focal weakness at this time.  Pain is severe at times and not well controlled with gabapentin  -We discussed trial of prednisone taper and reviewed potential side effects -Consider low-dose tramadol 50 mg every 6-8 hours as needed for severe pain.  He is aware this may cause some sedation and lightheadedness as well -Office follow-up in 2 weeks to reassess.  Consider follow-up with neurosurgeon if not improving at follow-up  Meds ordered this encounter  Medications  . predniSONE (DELTASONE) 10 MG tablet    Sig: Taper as follows:  4-4-4-3-3-2-2-1-1    Dispense:  24 tablet    Refill:  0  . traMADol (ULTRAM) 50 MG tablet    Sig: Take 1 tablet (50 mg total) by mouth every 8 (eight) hours as needed for up to 5 days.    Dispense:  15 tablet    Refill:  0    Follow-up: Return in about 2 weeks (around 09/18/2020).    Carolann Littler, MD

## 2020-09-13 ENCOUNTER — Other Ambulatory Visit: Payer: Self-pay | Admitting: Cardiology

## 2020-09-13 DIAGNOSIS — I480 Paroxysmal atrial fibrillation: Secondary | ICD-10-CM

## 2020-09-13 NOTE — Telephone Encounter (Signed)
Eliquis 2.5mg  refill request received. Patient is 87 years old, weight-94.3kg, Crea-1.53 on 03/27/2020, Diagnosis-Afib, and last seen by Dr. Radford Pax on 11/03/2019 and has an appt pending on 11/14/2020. Dose is appropriate based on dosing criteria. Will send in refill to requested pharmacy.

## 2020-09-18 ENCOUNTER — Other Ambulatory Visit: Payer: Self-pay

## 2020-09-18 ENCOUNTER — Ambulatory Visit: Payer: Medicare PPO | Admitting: Family Medicine

## 2020-09-19 ENCOUNTER — Ambulatory Visit (INDEPENDENT_AMBULATORY_CARE_PROVIDER_SITE_OTHER): Payer: Medicare PPO | Admitting: Family Medicine

## 2020-09-19 VITALS — BP 140/70 | HR 73 | Ht 74.0 in | Wt 209.0 lb

## 2020-09-19 DIAGNOSIS — N401 Enlarged prostate with lower urinary tract symptoms: Secondary | ICD-10-CM

## 2020-09-19 DIAGNOSIS — R3914 Feeling of incomplete bladder emptying: Secondary | ICD-10-CM | POA: Diagnosis not present

## 2020-09-19 DIAGNOSIS — E7800 Pure hypercholesterolemia, unspecified: Secondary | ICD-10-CM

## 2020-09-19 DIAGNOSIS — M48062 Spinal stenosis, lumbar region with neurogenic claudication: Secondary | ICD-10-CM

## 2020-09-19 LAB — HEPATIC FUNCTION PANEL
ALT: 13 U/L (ref 0–53)
AST: 13 U/L (ref 0–37)
Albumin: 4 g/dL (ref 3.5–5.2)
Alkaline Phosphatase: 72 U/L (ref 39–117)
Bilirubin, Direct: 0.1 mg/dL (ref 0.0–0.3)
Total Bilirubin: 0.5 mg/dL (ref 0.2–1.2)
Total Protein: 6.2 g/dL (ref 6.0–8.3)

## 2020-09-19 LAB — LIPID PANEL
Cholesterol: 152 mg/dL (ref 0–200)
HDL: 33.3 mg/dL — ABNORMAL LOW (ref >39.00)
NonHDL: 119.07
Total CHOL/HDL Ratio: 5
Triglycerides: 288 mg/dL — ABNORMAL HIGH (ref 0.0–149.0)
VLDL: 57.6 mg/dL — ABNORMAL HIGH (ref 0.0–40.0)

## 2020-09-19 LAB — LDL CHOLESTEROL, DIRECT: Direct LDL: 91 mg/dL

## 2020-09-19 MED ORDER — GABAPENTIN 100 MG PO CAPS: ORAL_CAPSULE | ORAL | 5 refills | Status: DC

## 2020-09-19 MED ORDER — FINASTERIDE 5 MG PO TABS: 5.0000 mg | ORAL_TABLET | Freq: Every day | ORAL | 2 refills | Status: DC

## 2020-09-19 NOTE — Progress Notes (Signed)
Established Patient Office Visit  Subjective:  Patient ID: [name], male    DOB: 1932/03/24  Age: 87 y.o. MRN: [id number]  CC:  Chief Complaint  Patient presents with  . Follow-up    HPI [name] presents for medical follow-up.  We discussed several issues today's as follows  History of lumbar stenosis.  He is recently had some neurogenic claudication type symptoms right lower extremity He does get some relief with gabapentin and he is increased this up to 200 mg twice daily and sometimes taken third dose as well.  Needs refills.  He does have some chronic low back pain with ambulation which he has had for years even following his back surgery for lumbar stenosis several years ago but recent pains have been mostly right lower extremity.  No urine or stool incontinence.  He took the tramadol only infrequently.  Getting best relief from the gabapentin.  Burning quality of pain frequently right ankle.  No swelling.  History of BPH.  Currently on Flomax 0.4 mg nightly.  Frequently has to get up at night.  Some slow stream and occasional dribbling.  No recent burning with urination.  Third issue is he is on statin with lovastatin for hyperlipidemia.  Last lipids were over a year ago.  No significant myalgias.  Past Medical History:  Diagnosis Date  . Actinic keratosis 03/13/2009  . Arthritis   . BENIGN PROSTATIC HYPERTROPHY, HX OF 04/07/2009  . CARCINOMA, SKIN, SQUAMOUS CELL 09/11/2009  . COLONIC POLYPS, HX OF 03/13/2009  . Essential hypertension   . GERD 03/13/2009  . Hyperlipidemia   . INSOMNIA, CHRONIC 09/11/2009  . Persistent atrial fibrillation ([place]) 04/07/2009   a. s/p afib ablation at [facility] x 2 in 2010.  Marland Kitchen POLYPECTOMY, HX OF 04/07/2009  . Premature atrial contractions   . PULMONARY NODULE 09/11/2009  . PVC's (premature ventricular contractions)   . Rosacea 03/13/2009  . SKIN CANCER, HX OF 03/13/2009    Past Surgical History:  Procedure Laterality Date  . ABLATION   2010   x2  . COLONOSCOPY W/ BIOPSIES AND POLYPECTOMY    . EYE SURGERY Bilateral    cataracts  . LUMBAR DISC SURGERY     RUPTURE  . LUMBAR LAMINECTOMY/DECOMPRESSION MICRODISCECTOMY N/A 04/20/2014   Procedure: LUMBAR TWO-THREE, LUMBAR THREE-FOUR, LUMAR FOUR-FIVE LAMINECTOMIES;  Surgeon: Newman Pies, MD;  Location: [place] [facility];  Service: Neurosurgery;  Laterality: N/A;  . POLYPECTOMY    . SKIN CANCER EXCISION      Family History  Problem Relation Age of Onset  . Cancer Mother   . Cancer Father 73       COLON  . Heart attack Father   . Heart disease Father   . Heart disease Sister   . CAD Sister   . Heart disease Brother   . CAD Brother   . CAD Brother   . Heart disease Brother     Social History   Socioeconomic History  . Marital status: Married    Spouse name: Not on file  . Number of children: Not on file  . Years of education: Not on file  . Highest education level: Not on file  Occupational History  . Not on file  Tobacco Use  . Smoking status: Former Smoker    Packs/day: 1.00    Years: 30.00    Pack years: 30.00    Types: Cigarettes    Quit date: 03/13/1988    Years since quitting: 32.5  .  Smokeless tobacco: Current User    Types: Chew  Substance and Sexual Activity  . Alcohol use: No  . Drug use: No  . Sexual activity: Not on file  Other Topics Concern  . Not on file  Social History Narrative  . Not on file   Social Determinants of Health   Financial Resource Strain: Low Risk   . Difficulty of Paying Living Expenses: Not very hard  Food Insecurity: Not on file  Transportation Needs: No Transportation Needs  . Lack of Transportation (Medical): No  . Lack of Transportation (Non-Medical): No  Physical Activity: Not on file  Stress: Not on file  Social Connections: Not on file  Intimate Partner Violence: Not on file    Outpatient Medications Prior to Visit  Medication Sig Dispense Refill  . acetaminophen (TYLENOL) 500 MG tablet Take 500 mg  by mouth every 6 (six) hours as needed for mild pain.    Marland Kitchen amLODipine (NORVASC) 2.5 MG tablet TAKE 1 TABLET EVERY DAY 90 tablet 1  . clobetasol ointment (TEMOVATE) AB-123456789 % Apply 1 application topically 2 (two) times daily as needed (Rash).     Marland Kitchen ELIQUIS 2.5 MG TABS tablet TAKE 1 TABLET TWICE DAILY 180 tablet 1  . furosemide (LASIX) 20 MG tablet Take 1 tablet (20 mg total) by mouth daily as needed for fluid.    Marland Kitchen glucosamine-chondroitin 500-400 MG tablet Take 2 tablets by mouth daily.    . lansoprazole (PREVACID) 30 MG capsule TAKE 1 CAPSULE (30 MG TOTAL) BY MOUTH DAILY AT 12 NOON. 90 capsule 2  . lovastatin (MEVACOR) 20 MG tablet TAKE 1 TABLET (20 MG TOTAL) BY MOUTH AT BEDTIME. 90 tablet 1  . metoprolol succinate (TOPROL-XL) 25 MG 24 hr tablet TAKE 1 TABLET EVERY DAY 90 tablet 1  . metroNIDAZOLE (METROCREAM) 0.75 % cream APPLY TOPICALLY 3 TIMES A  WEEK ON THE DAYS HE SHAVES 45 g 1  . nitroGLYCERIN (NITROSTAT) 0.4 MG SL tablet Place 1 tablet (0.4 mg total) under the tongue every 5 (five) minutes x 3 doses as needed for chest pain. 25 tablet 0  . SYMBICORT 160-4.5 MCG/ACT inhaler TAKE 2 PUFFS BY MOUTH TWICE A DAY 30.6 each 3  . tamsulosin (FLOMAX) 0.4 MG CAPS capsule Take 1 capsule (0.4 mg total) by mouth daily. 90 capsule 3  . vitamin B-12 (CYANOCOBALAMIN) 500 MCG tablet Take 500 mcg by mouth daily.    Marland Kitchen VITAMIN D PO Take 400 Units by mouth daily.     Marland Kitchen gabapentin (NEURONTIN) 100 MG capsule TAKE 1 CAPSULE IN THE MORNING  AND TAKE 2 CAPSULES AT BEDTIME 270 capsule 1  . predniSONE (DELTASONE) 10 MG tablet Taper as follows:  4-4-4-3-3-2-2-1-1 24 tablet 0   No facility-administered medications prior to visit.    Allergies  Allergen Reactions  . Amoxicillin-Pot Clavulanate Diarrhea and Nausea Only  . Losartan Other (See Comments) and Nausea And Vomiting    Elevated creatinine levels Elevated creatinine levels    ROS Review of Systems  Constitutional: Negative for fatigue.  Eyes: Negative for  visual disturbance.  Respiratory: Negative for cough, chest tightness and shortness of breath.   Cardiovascular: Negative for chest pain, palpitations and leg swelling.  Endocrine: Negative for polydipsia and polyuria.  Musculoskeletal: Positive for back pain.  Neurological: Negative for dizziness, syncope, weakness, light-headedness and headaches.      Objective:    Physical Exam Vitals reviewed.  Constitutional:      Appearance: Normal appearance.  Cardiovascular:  Rate and Rhythm: Normal rate and regular rhythm.  Pulmonary:     Effort: Pulmonary effort is normal.     Breath sounds: Normal breath sounds.  Musculoskeletal:     Right lower leg: No edema.     Left lower leg: No edema.     Comments: Straight leg raises are negative bilaterally  Neurological:     Mental Status: He is alert.     Comments: Trace to 1+ knee reflexes bilaterally and 2+ ankle bilaterally     BP 140/70   Pulse 73   Ht 6\' 2"  (1.88 m)   Wt 209 lb (94.8 kg)   SpO2 98%   BMI 26.83 kg/m  Wt Readings from Last 3 Encounters:  09/19/20 209 lb (94.8 kg)  09/04/20 208 lb (94.3 kg)  03/27/20 203 lb 8 oz (92.3 kg)     Health Maintenance Due  Topic Date Due  . COVID-19 Vaccine (3 - Pfizer risk 4-dose series) 10/28/2019  . TETANUS/TDAP  08/29/2020    There are no preventive care reminders to display for this patient.  Lab Results  Component Value Date   TSH 2.32 03/27/2020   Lab Results  Component Value Date   WBC 7.1 03/27/2020   HGB 15.4 03/27/2020   HCT 44.6 03/27/2020   MCV 97.6 03/27/2020   PLT 222 03/27/2020   Lab Results  Component Value Date   NA 138 03/27/2020   K 4.4 03/27/2020   CO2 23 03/27/2020   GLUCOSE 128 (H) 03/27/2020   BUN 22 03/27/2020   CREATININE 1.53 (H) 03/27/2020   BILITOT 0.5 03/27/2020   ALKPHOS 84 04/07/2019   AST 14 03/27/2020   ALT 7 (L) 03/27/2020   PROT 6.6 03/27/2020   ALBUMIN 3.9 04/07/2019   CALCIUM 9.0 03/27/2020   ANIONGAP 10 04/07/2019    GFR 49.80 (L) 03/16/2019   Lab Results  Component Value Date   CHOL 128 04/08/2019   Lab Results  Component Value Date   HDL 28 (L) 04/08/2019   Lab Results  Component Value Date   LDLCALC 87 04/08/2019   Lab Results  Component Value Date   TRIG 66 04/08/2019   Lab Results  Component Value Date   CHOLHDL 4.6 04/08/2019   Lab Results  Component Value Date   HGBA1C 5.7 (H) 04/07/2019      Assessment & Plan:   #1 history of lumbar stenosis with recent right lower extremity neurogenic claudication type symptoms.  Improved some with gabapentin  -Send a new prescription for gabapentin to take 200 mg 3 times daily  #2 BPH.  Patient has had some gradual progressive symptoms on Flomax. -We discussed possible addition of finasteride 5 mg daily.  He is aware this may take several months to see full effect.  Reviewed potential side effects.  #3 hyperlipidemia treated with lovastatin -Recheck lipid and hepatic panel.    Scheduled office follow-up in 3 months to reassess  Meds ordered this encounter  Medications  . gabapentin (NEURONTIN) 100 MG capsule    Sig: Take two capsules by mouth three times daily    Dispense:  180 capsule    Refill:  5  . finasteride (PROSCAR) 5 MG tablet    Sig: Take 1 tablet (5 mg total) by mouth daily.    Dispense:  30 tablet    Refill:  2    Follow-up: Return in about 3 months (around 12/18/2020).    Carolann Littler, MD

## 2020-09-19 NOTE — Patient Instructions (Signed)
[employer] neurological surgery (7th ed., pp. 2373-2383). [place], [place]: [employer]."> [employer] neurological surgery (7th ed., pp. 2344-2347). [place], [place]: [employer].">  Spinal Stenosis  Spinal stenosis is a condition that happens when the spinal canal narrows. The spinal canal is the space between the bones of your spine (vertebrae). This narrowing puts pressure on the spinal cord or nerves. Spinal stenosis can affect the vertebrae in the neck, upper back, and lower back. This condition can range from mild to severe. In some cases, there are no symptoms. What are the causes? This condition is caused by areas of bone pushing into the spinal canal. This condition may be present at birth (congenital), or it may be caused by:  Slow breakdown of your vertebrae (spinal degeneration). This usually starts around 85 years of age.  Injury (trauma) to your spine.  Tumors in your spine.  Calcium deposits in your spine. What increases the risk? The following factors may make you more likely to develop this condition:  Being older than age 50.  Having a problem present at birth with an abnormally shaped spine (congenitalspinal deformity), such as scoliosis.  Having arthritis. What are the signs or symptoms? Symptoms of this condition include:  Pain in the neck or back that is generally worse with activities, particularly when you stand or walk.  Numbness, tingling, hot or cold sensations, weakness, or tiredness (fatigue) in your leg or legs.  Pain going from the buttock, down the thigh, and to the calf (sciatica). This can happen in one or both legs.  Frequent episodes of falling.  A foot-slapping gait that leads to muscle weakness. In more severe cases, you may develop:  Problems having a bowel movement or urinating.  Difficulty having sex.  Loss of feeling in your legs and inability to walk. Symptoms may come on slowly and get worse over time. In some cases, there  are no symptoms. How is this diagnosed? This condition is diagnosed based on your medical history and a physical exam. You will also have tests, such as an MRI, a CT scan, or an X-ray. How is this treated? Treatment for this condition often focuses on managing your pain and any other symptoms. Treatment may include:  Practicing good posture to lessen pressure on your nerves.  Exercising to strengthen muscles, build endurance, improve balance, and maintain range of motion. This may include physical therapy to restore movement and strength to your back.  Losing weight, if needed.  Medicines to reduce inflammation or pain. This may include a medicine that is injected into your spine (steroidinjection).  Assistive devices, such as a corset or brace. In some cases, surgery may be needed. The most common procedure is decompression laminectomy. This is done to remove excess bone that puts pressure on your nerve roots. Follow these instructions at home: Managing pain, stiffness, and swelling  Practice good posture. If you were given a brace or a corset, wear it as told by your health care provider.  Maintain a healthy weight. Talk with your health care provider if you need help losing weight.  If directed, apply heat to the affected area as often as told by your health care provider. Use the heat source that your health care provider recommends, such as a moist heat pack or a heating pad. ? Place a towel between your skin and the heat source. ? Leave the heat on for 20-30 minutes. ? Remove the heat if your skin turns bright red. This is especially important if   you are unable to feel pain, heat, or cold. You may have a greater risk of getting burned.   Activity  Do all exercises and stretches as told by your health care provider.  Do not do any activities that cause pain. Ask your health care provider what activities are safe for you.  Do not lift anything that is heavier than 10 lb (4.5 kg),  or the limit that you are told by your health care provider.  Return to your normal activities as told by your health care provider. Ask your health care provider what activities are safe for you. General instructions  Take over-the-counter and prescription medicines only as told by your health care provider.  Do not use any products that contain nicotine or tobacco, such as cigarettes, e-cigarettes, and chewing tobacco. If you need help quitting, ask your health care provider.  Eat a healthy diet. This includes plenty of fruits and vegetables, whole grains, and low-fat (lean) protein.  Keep all follow-up visits as told by your health care provider. This is important. Contact a health care provider if:  Your symptoms do not get better or they get worse.  You have a fever. Get help right away if:  You have new pain or symptoms of severe pain, such as: ? New or worsening pain in your neck or upper back. ? Severe pain that cannot be controlled with medicines. ? A severe headache that gets worse when you stand.  You are dizzy.  You have vision problems, such as blurred vision or double vision.  You have nausea or you vomit.  You develop new or worsening numbness or tingling in your back or legs.  You have pain, redness, swelling, or warmth in your arm or leg. Summary  Spinal stenosis is a condition that happens when the spinal canal narrows. The spinal canal is the space between the bones of your spine (vertebrae). This narrowing puts pressure on the spinal cord or nerves.  This condition may be caused by a birth defect, breakdown of your vertebrae, trauma, tumors, or calcium deposits.  Spinal stenosis can cause numbness, weakness, or pain in the buttocks, neck, back, and legs.  This condition is usually diagnosed with your medical history, a physical exam, and tests, such as an MRI, a CT scan, or an X-ray. This information is not intended to replace advice given to you by your  health care provider. Make sure you discuss any questions you have with your health care provider. Document Revised: 06/09/2019 Document Reviewed: 06/09/2019 Elsevier Patient Education  2021 [employer]  

## 2020-09-21 ENCOUNTER — Telehealth: Payer: Self-pay

## 2020-09-21 NOTE — Telephone Encounter (Signed)
-----   Message from [name], MD sent at 09/20/2020  3:06 PM EST ----- Liver panel normal.   Cholesterol stable.  Triglycerides are up.  Keep refined sugars down and increase natural sources of omega 3- eg salmon, tuna, walnuts, flaxseed, pumpkin seed

## 2020-09-21 NOTE — Telephone Encounter (Signed)
Informed patients wife of results and recommendations.

## 2020-10-01 ENCOUNTER — Telehealth (INDEPENDENT_AMBULATORY_CARE_PROVIDER_SITE_OTHER): Payer: Medicare PPO | Admitting: Family Medicine

## 2020-10-01 ENCOUNTER — Encounter: Payer: Self-pay | Admitting: Family Medicine

## 2020-10-01 DIAGNOSIS — M5416 Radiculopathy, lumbar region: Secondary | ICD-10-CM | POA: Diagnosis not present

## 2020-10-01 DIAGNOSIS — M48062 Spinal stenosis, lumbar region with neurogenic claudication: Secondary | ICD-10-CM

## 2020-10-01 DIAGNOSIS — M5431 Sciatica, right side: Secondary | ICD-10-CM | POA: Diagnosis not present

## 2020-10-01 NOTE — Progress Notes (Signed)
Patient ID: [name], male   DOB: 1932/07/23, 87 y.o.   MRN: [id number]   This visit type was conducted due to national recommendations for restrictions regarding the COVID-19 pandemic in an effort to limit this patient's exposure and mitigate transmission in our community.   Virtual Visit via Video Note  I connected with [name] on 10/01/20 at  8:00 AM EST by a video enabled telemedicine application and verified that I am speaking with the correct person using two identifiers.  Location patient: home Location provider:work or home office Persons participating in the virtual visit: patient, provider  I discussed the limitations of evaluation and management by telemedicine and the availability of in person appointments. The patient expressed understanding and agreed to proceed.   HPI: Ms. Tamargo now has had almost 6 weeks of progressive right lumbar back pain with radiation down toward the foot.  No recent injury.  Has had 2 prior surgeries.  Around 1997 he had disc herniation.  He had fairly extensive back surgery back in 2015 with lumbar laminectomies L2-L3, L3-L4, L4-L5.  His current pain started around 1 January.  He has had progressive pain since then.  He describes a sharp and burning quality pain which radiates to the back all the way down to the foot.  His worst pain is frequently around the ankle and lower leg.  This is impairing walking.  He is difficulty with ambulation and this is interfering with ADLs.  He has had difficulty even getting to the bathroom at times.  He has pain also with changing positions.  No urine or stool incontinence.  Regarding his ambulation, we do not feel cane, walker, or crutch will be of much assistance.  His pain is predominantly with ambulation and this is interfering with activities of daily living such as toileting and feeding and dressing.  He tried some tramadol but had constipation and this also made him feel very dizzy.  They have  gotten some relief with gabapentin taking up to 300 mg several times daily.  They have tried to manage this with gabapentin without adding any additional opioids.  Pain has been 10 out of 10 at times.  Has interfered some with sleep.  No recent fever.  No appetite or weight changes.   ROS: See pertinent positives and negatives per HPI.  Past Medical History:  Diagnosis Date  . Actinic keratosis 03/13/2009  . Arthritis   . BENIGN PROSTATIC HYPERTROPHY, HX OF 04/07/2009  . CARCINOMA, SKIN, SQUAMOUS CELL 09/11/2009  . COLONIC POLYPS, HX OF 03/13/2009  . Essential hypertension   . GERD 03/13/2009  . Hyperlipidemia   . INSOMNIA, CHRONIC 09/11/2009  . Persistent atrial fibrillation ([place]) 04/07/2009   a. s/p afib ablation at [facility] x 2 in 2010.  Marland Kitchen POLYPECTOMY, HX OF 04/07/2009  . Premature atrial contractions   . PULMONARY NODULE 09/11/2009  . PVC's (premature ventricular contractions)   . Rosacea 03/13/2009  . SKIN CANCER, HX OF 03/13/2009    Past Surgical History:  Procedure Laterality Date  . ABLATION  2010   x2  . COLONOSCOPY W/ BIOPSIES AND POLYPECTOMY    . EYE SURGERY Bilateral    cataracts  . LUMBAR DISC SURGERY     RUPTURE  . LUMBAR LAMINECTOMY/DECOMPRESSION MICRODISCECTOMY N/A 04/20/2014   Procedure: LUMBAR TWO-THREE, LUMBAR THREE-FOUR, LUMAR FOUR-FIVE LAMINECTOMIES;  Surgeon: Newman Pies, MD;  Location: [place] [facility];  Service: Neurosurgery;  Laterality: N/A;  . POLYPECTOMY    . SKIN CANCER EXCISION  Family History  Problem Relation Age of Onset  . Cancer Mother   . Cancer Father 51       COLON  . Heart attack Father   . Heart disease Father   . Heart disease Sister   . CAD Sister   . Heart disease Brother   . CAD Brother   . CAD Brother   . Heart disease Brother     SOCIAL HX: Non-smoker   Current Outpatient Medications:  .  acetaminophen (TYLENOL) 500 MG tablet, Take 500 mg by mouth every 6 (six) hours as needed for mild pain., Disp: , Rfl:  .  amLODipine  (NORVASC) 2.5 MG tablet, TAKE 1 TABLET EVERY DAY, Disp: 90 tablet, Rfl: 1 .  clobetasol ointment (TEMOVATE) [id number] %, Apply 1 application topically 2 (two) times daily as needed (Rash). , Disp: , Rfl:  .  ELIQUIS 2.5 MG TABS tablet, TAKE 1 TABLET TWICE DAILY, Disp: 180 tablet, Rfl: 1 .  finasteride (PROSCAR) 5 MG tablet, Take 1 tablet (5 mg total) by mouth daily., Disp: 30 tablet, Rfl: 2 .  furosemide (LASIX) 20 MG tablet, Take 1 tablet (20 mg total) by mouth daily as needed for fluid., Disp: , Rfl:  .  gabapentin (NEURONTIN) 100 MG capsule, Take two capsules by mouth three times daily, Disp: 180 capsule, Rfl: 5 .  glucosamine-chondroitin 500-400 MG tablet, Take 2 tablets by mouth daily., Disp: , Rfl:  .  lansoprazole (PREVACID) 30 MG capsule, TAKE 1 CAPSULE (30 MG TOTAL) BY MOUTH DAILY AT 12 NOON., Disp: 90 capsule, Rfl: 2 .  lovastatin (MEVACOR) 20 MG tablet, TAKE 1 TABLET (20 MG TOTAL) BY MOUTH AT BEDTIME., Disp: 90 tablet, Rfl: 1 .  metoprolol succinate (TOPROL-XL) 25 MG 24 hr tablet, TAKE 1 TABLET EVERY DAY, Disp: 90 tablet, Rfl: 1 .  metroNIDAZOLE (METROCREAM) 0.75 % cream, APPLY TOPICALLY 3 TIMES A  WEEK ON THE DAYS HE SHAVES, Disp: 45 g, Rfl: 1 .  nitroGLYCERIN (NITROSTAT) 0.4 MG SL tablet, Place 1 tablet (0.4 mg total) under the tongue every 5 (five) minutes x 3 doses as needed for chest pain., Disp: 25 tablet, Rfl: 0 .  SYMBICORT 160-4.5 MCG/ACT inhaler, TAKE 2 PUFFS BY MOUTH TWICE A DAY, Disp: 30.6 each, Rfl: 3 .  tamsulosin (FLOMAX) 0.4 MG CAPS capsule, Take 1 capsule (0.4 mg total) by mouth daily., Disp: 90 capsule, Rfl: 3 .  vitamin B-12 (CYANOCOBALAMIN) 500 MCG tablet, Take 500 mcg by mouth daily., Disp: , Rfl:  .  VITAMIN D PO, Take 400 Units by mouth daily. , Disp: , Rfl:   EXAM:  VITALS per patient if applicable:  GENERAL: alert, oriented, appears well and in no acute distress  HEENT: atraumatic, conjunttiva clear, no obvious abnormalities on inspection of external nose and  ears  NECK: normal movements of the head and neck  LUNGS: on inspection no signs of respiratory distress, breathing rate appears normal, no obvious gross SOB, gasping or wheezing  CV: no obvious cyanosis  MS: moves all visible extremities without noticeable abnormality  PSYCH/NEURO: pleasant and cooperative, no obvious depression or anxiety, speech and thought processing grossly intact  ASSESSMENT AND PLAN:  Discussed the following assessment and plan:  Right lumbar radiculitis - Plan: Ambulatory referral to Neurosurgery, DME Wheelchair manual, MR Lumbar Spine Wo Contrast   -Patient relates several week history of progressive right lumbar radiculitis symptoms. -Continue gabapentin 300 mg 3 times daily -Set up MRI lumbar spine to further assess -We will also go  ahead and set up referral back to Dr. Arnoldo Morale his neurosurgeon -Wife requesting prescription for manual wheelchair.  He is unable to ambulate very well with a cane or crutch or walker because of the pain and we will send in prescription for manual wheelchair.     I discussed the assessment and treatment plan with the patient. The patient was provided an opportunity to ask questions and all were answered. The patient agreed with the plan and demonstrated an understanding of the instructions.   The patient was advised to call back or seek an in-person evaluation if the symptoms worsen or if the condition fails to improve as anticipated.     Carolann Littler, MD

## 2020-10-02 ENCOUNTER — Telehealth: Payer: Self-pay | Admitting: Family Medicine

## 2020-10-02 NOTE — Telephone Encounter (Signed)
Spoke with Corene Cornea at [facility] to inform him the ICD9 code is M54.16 for right lumbar radiculitis per the office note from 2/7.  Per Corene Cornea the number cannot be given as a new, fresh Rx is required.  A new order form for [facility] was completed with this info (per the same DME order from 2/7) and faxed to [phone].  Patients wife is aware.

## 2020-10-02 NOTE — Telephone Encounter (Signed)
Pts spouse is calling in stating that [facility] [phone] is needing a ICD-9 code for the wheelchair order that was sent in on yesterday.

## 2020-10-11 DIAGNOSIS — D485 Neoplasm of uncertain behavior of skin: Secondary | ICD-10-CM | POA: Diagnosis not present

## 2020-10-11 DIAGNOSIS — C44229 Squamous cell carcinoma of skin of left ear and external auricular canal: Secondary | ICD-10-CM | POA: Diagnosis not present

## 2020-10-18 ENCOUNTER — Other Ambulatory Visit: Payer: Self-pay

## 2020-10-18 ENCOUNTER — Ambulatory Visit
Admission: RE | Admit: 2020-10-18 | Discharge: 2020-10-18 | Disposition: A | Discharge status: Home / Self Care | Payer: Medicare PPO | Source: Ambulatory Visit | Attending: Family Medicine | Admitting: Family Medicine

## 2020-10-18 DIAGNOSIS — M545 Low back pain, unspecified: Secondary | ICD-10-CM | POA: Diagnosis not present

## 2020-10-18 DIAGNOSIS — M48061 Spinal stenosis, lumbar region without neurogenic claudication: Secondary | ICD-10-CM | POA: Diagnosis not present

## 2020-10-18 DIAGNOSIS — M5416 Radiculopathy, lumbar region: Secondary | ICD-10-CM

## 2020-10-23 DIAGNOSIS — M5416 Radiculopathy, lumbar region: Secondary | ICD-10-CM | POA: Diagnosis not present

## 2020-10-23 DIAGNOSIS — I10 Essential (primary) hypertension: Secondary | ICD-10-CM | POA: Diagnosis not present

## 2020-10-23 DIAGNOSIS — M545 Low back pain, unspecified: Secondary | ICD-10-CM | POA: Diagnosis not present

## 2020-10-23 DIAGNOSIS — Z6825 Body mass index (BMI) 25.0-25.9, adult: Secondary | ICD-10-CM | POA: Diagnosis not present

## 2020-10-23 DIAGNOSIS — M48062 Spinal stenosis, lumbar region with neurogenic claudication: Secondary | ICD-10-CM | POA: Diagnosis not present

## 2020-10-25 DIAGNOSIS — D0461 Carcinoma in situ of skin of right upper limb, including shoulder: Secondary | ICD-10-CM | POA: Diagnosis not present

## 2020-11-08 DIAGNOSIS — M5416 Radiculopathy, lumbar region: Secondary | ICD-10-CM | POA: Diagnosis not present

## 2020-11-12 ENCOUNTER — Telehealth: Payer: Self-pay | Admitting: *Deleted

## 2020-11-12 DIAGNOSIS — M545 Low back pain, unspecified: Secondary | ICD-10-CM | POA: Diagnosis not present

## 2020-11-12 DIAGNOSIS — M5416 Radiculopathy, lumbar region: Secondary | ICD-10-CM | POA: Diagnosis not present

## 2020-11-12 DIAGNOSIS — M48062 Spinal stenosis, lumbar region with neurogenic claudication: Secondary | ICD-10-CM | POA: Diagnosis not present

## 2020-11-12 NOTE — Telephone Encounter (Signed)
   [facility] Pre-operative Risk Assessment    HEARTCARE STAFF: - Please ensure there is not already an duplicate clearance open for this procedure. - Under Visit Info/Reason for Call, type in Other and utilize the format Clearance MM/DD/YY or Clearance TBD. Do not use dashes or single digits. - If request is for dental extraction, please clarify the # of teeth to be extracted.  Request for surgical clearance:  1. What type of surgery is being performed? SPINAL INJECTION   2. When is this surgery scheduled? TBD   3. What type of clearance is required (medical clearance vs. Pharmacy clearance to hold med vs. Both)? BOTH  4. Are there any medications that need to be held prior to surgery and how long? ELIQUIS x 3 DAYS   5. Practice name and name of physician performing surgery? [facility]; DR. Gwenlyn Perking BARTKO   6. What is the office phone number? [phone]   7.   What is the office fax number? [phone]  8.   Anesthesia type (None, local, MAC, general) ? NONE LISTED   [name] 11/12/2020, 1:11 PM  _________________________________________________________________   (provider comments below)

## 2020-11-12 NOTE — Telephone Encounter (Signed)
Patient with diagnosis of A Fib on Eliquisfor anticoagulation.    Procedure: spinal injection Date of procedure: TBD   CHA2DS2-VASc Score = 3  This indicates a 3.2% annual risk of stroke. The patient's score is based upon: CHF History: No HTN History: Yes Diabetes History: No Stroke History: No Vascular Disease History: No Age Score: 2 Gender Score: 0    CrCl 46 mL/min Platelet count 222K .  Per office protocol, patient can hold Eliquis for 3 days prior to procedure.

## 2020-11-12 NOTE — Telephone Encounter (Signed)
Noted. This info is now available for MD at time of OV.

## 2020-11-12 NOTE — Telephone Encounter (Addendum)
   Primary Cardiologist: Fransico Him, MD  Chart reviewed as part of pre-operative protocol coverage. Cannot provide clearance as patient is >1 year out from last OV. However, he has appt scheduled this week on 11/14/20 with Dr. Radford Pax at which time clearance can be assessed. Per office protocol, the provider should assess clearance at time of office visit and should forward their finalized clearance decision to requesting party below. I will route this to our pharmacy team for review so that their recs are available at the time of Dr. Theodosia Blender appointment. Will also cc her here as FYI for later this week. Will also route an update to the requesting provider and remove from preop APP box. Addendum: also added preop clearance FYI to appt notes.  [name], PA-C 11/12/2020, 1:47 PM

## 2020-11-14 ENCOUNTER — Encounter: Payer: Self-pay | Admitting: Cardiology

## 2020-11-14 ENCOUNTER — Ambulatory Visit (INDEPENDENT_AMBULATORY_CARE_PROVIDER_SITE_OTHER): Payer: Medicare PPO | Admitting: Cardiology

## 2020-11-14 ENCOUNTER — Other Ambulatory Visit: Payer: Self-pay

## 2020-11-14 VITALS — BP 150/80 | HR 69 | Ht 74.0 in | Wt 213.0 lb

## 2020-11-14 DIAGNOSIS — I10 Essential (primary) hypertension: Secondary | ICD-10-CM | POA: Diagnosis not present

## 2020-11-14 DIAGNOSIS — I480 Paroxysmal atrial fibrillation: Secondary | ICD-10-CM

## 2020-11-14 DIAGNOSIS — E7800 Pure hypercholesterolemia, unspecified: Secondary | ICD-10-CM | POA: Diagnosis not present

## 2020-11-14 DIAGNOSIS — M4807 Spinal stenosis, lumbosacral region: Secondary | ICD-10-CM

## 2020-11-14 DIAGNOSIS — R0789 Other chest pain: Secondary | ICD-10-CM

## 2020-11-14 NOTE — Addendum Note (Signed)
Addended by: Antonieta Iba on: 11/14/2020 01:33 PM   Modules accepted: Orders

## 2020-11-14 NOTE — Patient Instructions (Addendum)
Medication Instructions:  Your physician recommends that you continue on your current medications as directed. Please refer to the Current Medication list given to you today.  *If you need a refill on your cardiac medications before your next appointment, please call your pharmacy*  Lab Work: Fasting lipids and CMET tomorrow 3/24 between 7:30am and 4:30pm If you have labs (blood work) drawn today and your tests are completely normal, you will receive your results only by: Marland Kitchen MyChart Message (if you have MyChart) OR . A paper copy in the mail If you have any lab test that is abnormal or we need to change your treatment, we will call you to review the results.  Follow-Up: At [facility], you and your health needs are our priority.  As part of our continuing mission to provide you with exceptional heart care, we have created designated Provider Care Teams.  These Care Teams include your primary Cardiologist (physician) and Advanced Practice Providers (APPs -  Physician Assistants and Nurse Practitioners) who all work together to provide you with the care you need, when you need it.  Your next appointment:   1 year(s)  The format for your next appointment:   In Person  Provider:   You may see Fransico Him, MD or one of the following Advanced Practice Providers on your designated Care Team:    Melina Copa, PA-C  Ermalinda Barrios, PA-C

## 2020-11-14 NOTE — Progress Notes (Signed)
Cardiology Office Note:    Date:  11/14/2020   ID:  [name], DOB May 21, 1932, MRN [id number]  PCP:  [name], MD  Cardiologist:  Fransico Him, MD    Referring MD: [name], MD   Chief Complaint  Patient presents with  . Atrial Fibrillation  . Hypertension  . Hyperlipidemia    History of Present Illness:    [name] is a 87 y.o. male with a hx of persistentatrial fibrillations/p afib ablation at [facility] x 2 in 2010, HTN, hyperlipidemia, PACs/PVCs (by monitor 2017), CKD stage III.   He has a history of prior atypical chest pain and negative stress test. Patient was having significant chest pain that had been intermittent for the last year, but had recently become more severe. Due to concern for unstable angina he was admitted to the hospital. Troponins were negative. 2D echo 04/08/19 showed EF 55-60%, no significant abnormalities. Outpatient nuc was arranged 04/15/19 which was normal, EF 55%.   He  is here today for followup and is doing well.  He denies any chest pain or pressure, PND, orthopnea, LE edema, dizziness, palpitations or syncope. He has chronic DOE that he thinks is stable and usually occurs only when really exerting himself.  He has been having chronic back pain and plan is for a spinal injection and he needs to be off his anticoagulation for 3 days.  He is compliant with his meds and is tolerating meds with no SE.    Past Medical History:  Diagnosis Date  . Actinic keratosis 03/13/2009  . Arthritis   . BENIGN PROSTATIC HYPERTROPHY, HX OF 04/07/2009  . CARCINOMA, SKIN, SQUAMOUS CELL 09/11/2009  . COLONIC POLYPS, HX OF 03/13/2009  . Essential hypertension   . GERD 03/13/2009  . Hyperlipidemia   . INSOMNIA, CHRONIC 09/11/2009  . Persistent atrial fibrillation ([place]) 04/07/2009   a. s/p afib ablation at [facility] x 2 in 2010.  Marland Kitchen POLYPECTOMY, HX OF 04/07/2009  . Premature atrial contractions   . PULMONARY NODULE 09/11/2009  . PVC's (premature  ventricular contractions)   . Rosacea 03/13/2009  . SKIN CANCER, HX OF 03/13/2009    Past Surgical History:  Procedure Laterality Date  . ABLATION  2010   x2  . COLONOSCOPY W/ BIOPSIES AND POLYPECTOMY    . EYE SURGERY Bilateral    cataracts  . LUMBAR DISC SURGERY     RUPTURE  . LUMBAR LAMINECTOMY/DECOMPRESSION MICRODISCECTOMY N/A 04/20/2014   Procedure: LUMBAR TWO-THREE, LUMBAR THREE-FOUR, LUMAR FOUR-FIVE LAMINECTOMIES;  Surgeon: Newman Pies, MD;  Location: [place] [facility];  Service: Neurosurgery;  Laterality: N/A;  . POLYPECTOMY    . SKIN CANCER EXCISION      Current Medications: Current Meds  Medication Sig  . acetaminophen (TYLENOL) 500 MG tablet Take 500 mg by mouth every 6 (six) hours as needed for mild pain.  Marland Kitchen amLODipine (NORVASC) 2.5 MG tablet TAKE 1 TABLET EVERY DAY  . clobetasol ointment (TEMOVATE) 1.06 % Apply 1 application topically 2 (two) times daily as needed (Rash).   Marland Kitchen ELIQUIS 2.5 MG TABS tablet TAKE 1 TABLET TWICE DAILY  . finasteride (PROSCAR) 5 MG tablet Take 1 tablet (5 mg total) by mouth daily.  . furosemide (LASIX) 20 MG tablet Take 1 tablet (20 mg total) by mouth daily as needed for fluid.  Marland Kitchen gabapentin (NEURONTIN) 100 MG capsule Take two capsules by mouth three times daily  . glucosamine-chondroitin 500-400 MG tablet Take 2 tablets by mouth daily.  . lansoprazole (  PREVACID) 30 MG capsule TAKE 1 CAPSULE (30 MG TOTAL) BY MOUTH DAILY AT 12 NOON.  Marland Kitchen lovastatin (MEVACOR) 20 MG tablet TAKE 1 TABLET (20 MG TOTAL) BY MOUTH AT BEDTIME.  . metoprolol succinate (TOPROL-XL) 25 MG 24 hr tablet TAKE 1 TABLET EVERY DAY  . metroNIDAZOLE (METROCREAM) 0.75 % cream APPLY TOPICALLY 3 TIMES A  WEEK ON THE DAYS HE SHAVES  . nitroGLYCERIN (NITROSTAT) 0.4 MG SL tablet Place 1 tablet (0.4 mg total) under the tongue every 5 (five) minutes x 3 doses as needed for chest pain.  . SYMBICORT 160-4.5 MCG/ACT inhaler TAKE 2 PUFFS BY MOUTH TWICE A DAY  . tamsulosin (FLOMAX) 0.4 MG CAPS  capsule Take 1 capsule (0.4 mg total) by mouth daily.  . vitamin B-12 (CYANOCOBALAMIN) 500 MCG tablet Take 500 mcg by mouth daily.  Marland Kitchen VITAMIN D PO Take 400 Units by mouth daily.      Allergies:   Amoxicillin-pot clavulanate and Losartan   Social History   Socioeconomic History  . Marital status: Married    Spouse name: Not on file  . Number of children: Not on file  . Years of education: Not on file  . Highest education level: Not on file  Occupational History  . Not on file  Tobacco Use  . Smoking status: Former Smoker    Packs/day: 1.00    Years: 30.00    Pack years: 30.00    Types: Cigarettes    Quit date: 03/13/1988    Years since quitting: 32.6  . Smokeless tobacco: Current User    Types: Chew  Substance and Sexual Activity  . Alcohol use: No  . Drug use: No  . Sexual activity: Not on file  Other Topics Concern  . Not on file  Social History Narrative  . Not on file   Social Determinants of Health   Financial Resource Strain: Low Risk   . Difficulty of Paying Living Expenses: Not very hard  Food Insecurity: Not on file  Transportation Needs: No Transportation Needs  . Lack of Transportation (Medical): No  . Lack of Transportation (Non-Medical): No  Physical Activity: Not on file  Stress: Not on file  Social Connections: Not on file     Family History: The patient's family history includes CAD in his brother, brother, and sister; Cancer in his mother; Cancer (age of onset: 60) in his father; Heart attack in his father; Heart disease in his brother, brother, father, and sister.  ROS:   Please see the history of present illness.    ROS  All other systems reviewed and negative.   EKGs/Labs/Other Studies Reviewed:    The following studies were reviewed today: Prior OV notes  EKG:  EKG is ordered today and showed NSR with no ST changes  Recent Labs: 03/27/2020: BUN 22; Creat 1.53; Hemoglobin 15.4; Platelets 222; Potassium 4.4; Sodium 138; TSH  2.32 09/19/2020: ALT 13   Recent Lipid Panel    Component Value Date/Time   CHOL 152 09/19/2020 1418   TRIG 288.0 (H) 09/19/2020 1418   HDL 33.30 (L) 09/19/2020 1418   CHOLHDL 5 09/19/2020 1418   VLDL 57.6 (H) 09/19/2020 1418   LDLCALC 87 04/08/2019 0358   LDLDIRECT 91.0 09/19/2020 1418    Physical Exam:    VS:  BP (!) 150/80 (BP Location: Left Arm, Patient Position: Sitting, Cuff Size: Normal)   Pulse 69   Ht 6\' 2"  (1.88 m)   Wt 213 lb (96.6 kg)   SpO2 95%  BMI 27.35 kg/m     Wt Readings from Last 3 Encounters:  11/14/20 213 lb (96.6 kg)  09/19/20 209 lb (94.8 kg)  09/04/20 208 lb (94.3 kg)     GEN: Well nourished, well developed in no acute distress HEENT: Normal NECK: No JVD; No carotid bruits LYMPHATICS: No lymphadenopathy CARDIAC:RRR, no murmurs, rubs, gallops RESPIRATORY:  Clear to auscultation without rales, wheezing or rhonchi  ABDOMEN: Soft, non-tender, non-distended MUSCULOSKELETAL:  No edema; No deformity  SKIN: Warm and dry NEUROLOGIC:  Alert and oriented x 3 PSYCHIATRIC:  Normal affect    ASSESSMENT:    1. Paroxysmal atrial fibrillation (HCC)   2. Atypical chest pain   3. Essential hypertension   4. Pure hypercholesterolemia   5. Spinal stenosis of lumbosacral region    PLAN:    In order of problems listed above:  1.  PAF -he is in NSR on exam today and has not had any bleeding problems -he denies any  bleeding problems on the DOAC -continue Eliquis 2.5mg  BID (Dose adjusted for age > 80 and SCr > 1.5) and Toprol XL 25mg  daily -check BMET and CBC  2.  Atypical chest pain -felt to be GI related -he had not had any further CP since I saw him last -he has chronic DOE when walking to the mailbox related to problems with severe back pain that is chronic and stable -nuclear stress test showed no ischemia 8/'2020  3.  HTN -BP is adequately controlled on exam today -continue amlodipine 2.5mg  BID and Toprol XL 25mg  daily  4.  HLD -LDL goal <  100 -continue Lovastatin 20mg  daily -followed by PCP  5. Back Pain -he needs to have an injection in his back and needs to be off ELIQUIS for the procedure -his CHADSVACS score is 3 (age>75 and HTN) and he has never had a CVA -He is ok to hold Eliquis for 3 days for steroid spinal injection   Medication Adjustments/Labs and Tests Ordered: Current medicines are reviewed at length with the patient today.  Concerns regarding medicines are outlined above.  Orders Placed This Encounter  Procedures  . EKG 12-Lead   No orders of the defined types were placed in this encounter.   Signed, Fransico Him, MD  11/14/2020 1:29 PM    [place]

## 2020-11-15 ENCOUNTER — Other Ambulatory Visit: Payer: Medicare PPO

## 2020-11-15 DIAGNOSIS — I480 Paroxysmal atrial fibrillation: Secondary | ICD-10-CM | POA: Diagnosis not present

## 2020-11-15 DIAGNOSIS — E7800 Pure hypercholesterolemia, unspecified: Secondary | ICD-10-CM | POA: Diagnosis not present

## 2020-11-15 LAB — COMPREHENSIVE METABOLIC PANEL
ALT: 7 IU/L (ref 0–44)
AST: 13 IU/L (ref 0–40)
Albumin/Globulin Ratio: 2.2 (ref 1.2–2.2)
Albumin: 4.2 g/dL (ref 3.6–4.6)
Alkaline Phosphatase: 69 IU/L (ref 44–121)
BUN/Creatinine Ratio: 15 (ref 10–24)
BUN: 19 mg/dL (ref 8–27)
Bilirubin Total: 0.5 mg/dL (ref 0.0–1.2)
CO2: 21 mmol/L (ref 20–29)
Calcium: 8.9 mg/dL (ref 8.6–10.2)
Chloride: 104 mmol/L (ref 96–106)
Creatinine, Ser: 1.23 mg/dL (ref 0.76–1.27)
Globulin, Total: 1.9 g/dL (ref 1.5–4.5)
Glucose: 109 mg/dL — ABNORMAL HIGH (ref 65–99)
Potassium: 4.4 mmol/L (ref 3.5–5.2)
Sodium: 141 mmol/L (ref 134–144)
Total Protein: 6.1 g/dL (ref 6.0–8.5)
eGFR: 57 mL/min/{1.73_m2} — ABNORMAL LOW (ref >59)

## 2020-11-15 LAB — LIPID PANEL
Chol/HDL Ratio: 4 ratio (ref 0.0–5.0)
Cholesterol, Total: 131 mg/dL (ref 100–199)
HDL: 33 mg/dL — ABNORMAL LOW (ref >39)
LDL Chol Calc (NIH): 74 mg/dL (ref 0–99)
Triglycerides: 138 mg/dL (ref 0–149)
VLDL Cholesterol Cal: 24 mg/dL (ref 5–40)

## 2020-11-19 NOTE — Telephone Encounter (Signed)
Dr. Theodosia Blender note re-sent to procedural office today. Appear this was also sent by Dr. Radford Pax 11/14/20.

## 2020-11-19 NOTE — Telephone Encounter (Signed)
Pt wife carolyn calling to f/u pt's clearance, she wanted to send clearance and last office visit notes from Dr. Radford Pax to [facility]; DR. Marlaine Hind

## 2020-11-29 DIAGNOSIS — D485 Neoplasm of uncertain behavior of skin: Secondary | ICD-10-CM | POA: Diagnosis not present

## 2020-11-29 DIAGNOSIS — L570 Actinic keratosis: Secondary | ICD-10-CM | POA: Diagnosis not present

## 2020-11-29 DIAGNOSIS — C44622 Squamous cell carcinoma of skin of right upper limb, including shoulder: Secondary | ICD-10-CM | POA: Diagnosis not present

## 2020-11-29 DIAGNOSIS — L905 Scar conditions and fibrosis of skin: Secondary | ICD-10-CM | POA: Diagnosis not present

## 2020-11-29 DIAGNOSIS — D0461 Carcinoma in situ of skin of right upper limb, including shoulder: Secondary | ICD-10-CM | POA: Diagnosis not present

## 2020-11-29 DIAGNOSIS — Z85828 Personal history of other malignant neoplasm of skin: Secondary | ICD-10-CM | POA: Diagnosis not present

## 2020-11-30 DIAGNOSIS — M5416 Radiculopathy, lumbar region: Secondary | ICD-10-CM | POA: Diagnosis not present

## 2020-11-30 DIAGNOSIS — I10 Essential (primary) hypertension: Secondary | ICD-10-CM | POA: Diagnosis not present

## 2020-11-30 DIAGNOSIS — Z6828 Body mass index (BMI) 28.0-28.9, adult: Secondary | ICD-10-CM | POA: Diagnosis not present

## 2020-12-04 ENCOUNTER — Telehealth: Payer: Self-pay | Admitting: Pharmacist

## 2020-12-04 NOTE — Chronic Care Management (AMB) (Signed)
    Chronic Care Management Pharmacy Assistant   Name: [name]  MRN: [id number] DOB: July 12, 1932  Reason for Encounter: General Adherence Call      Recent office visits:  . 02.07.2022 [name], MD Family Medicine (Video Visit) . 01.26.2022 Burchette, Alinda Sierras, MD Family Medicine o Finasteride 5 mg Oral Daily o Gabapentin 100 MG Take two capsules by mouth three times daily changed from 1 capsule in the morning and 2 at bedtime . 01.11.2022Burchette, Alinda Sierras, MD Family Medicine o Prednisone 10 MG Taper as follows:  4-4-4-3-3-2-2-1-1 (24 tablets) o Tramadol HCl 50 mg Oral Every 8 hours PRN  Recent consult visits:  None  Hospital visits:  None in previous 6 months  Medications: Outpatient Encounter Medications as of 12/04/2020  Medication Sig  . acetaminophen (TYLENOL) 500 MG tablet Take 500 mg by mouth every 6 (six) hours as needed for mild pain.  Marland Kitchen amLODipine (NORVASC) 2.5 MG tablet TAKE 1 TABLET EVERY DAY  . clobetasol ointment (TEMOVATE) 6.56 % Apply 1 application topically 2 (two) times daily as needed (Rash).   Marland Kitchen ELIQUIS 2.5 MG TABS tablet TAKE 1 TABLET TWICE DAILY  . finasteride (PROSCAR) 5 MG tablet Take 1 tablet (5 mg total) by mouth daily.  . furosemide (LASIX) 20 MG tablet Take 1 tablet (20 mg total) by mouth daily as needed for fluid.  Marland Kitchen gabapentin (NEURONTIN) 100 MG capsule Take two capsules by mouth three times daily  . glucosamine-chondroitin 500-400 MG tablet Take 2 tablets by mouth daily.  . lansoprazole (PREVACID) 30 MG capsule TAKE 1 CAPSULE (30 MG TOTAL) BY MOUTH DAILY AT 12 NOON.  Marland Kitchen lovastatin (MEVACOR) 20 MG tablet TAKE 1 TABLET (20 MG TOTAL) BY MOUTH AT BEDTIME.  . metoprolol succinate (TOPROL-XL) 25 MG 24 hr tablet TAKE 1 TABLET EVERY DAY  . metroNIDAZOLE (METROCREAM) 0.75 % cream APPLY TOPICALLY 3 TIMES A  WEEK ON THE DAYS HE SHAVES  . nitroGLYCERIN (NITROSTAT) 0.4 MG SL tablet Place 1 tablet (0.4 mg total) under the tongue every 5 (five)  minutes x 3 doses as needed for chest pain.  . SYMBICORT 160-4.5 MCG/ACT inhaler TAKE 2 PUFFS BY MOUTH TWICE A DAY  . tamsulosin (FLOMAX) 0.4 MG CAPS capsule Take 1 capsule (0.4 mg total) by mouth daily.  . vitamin B-12 (CYANOCOBALAMIN) 500 MCG tablet Take 500 mcg by mouth daily.  Marland Kitchen VITAMIN D PO Take 400 Units by mouth daily.    No facility-administered encounter medications on file as of 12/04/2020.   I spoke with the patient and his wife and discussed medication adherence. There are no issues with his current medication. Question the patient on the effects of the changes from his medications from his last appointment. He started Finasteride 5 mg Oral Daily. He states that he has been talking more period to improve his back pain. His wife states that he has received injections in his back since his last appointment. There have been no other changes to his medications. The patient needed to follow up with his PCP in three months and a new appointment was made. For April 25th at 2:15 PM. He denies any ED visits since our last CPP visit. The patient denies any side effects from his medications. He currently has no issues with our pharmacy.   Star Rating Drugs:  Dis[facility]  [facility]ov[facility] 12.06.2021 [address] [place], [place] [phone]

## 2020-12-09 ENCOUNTER — Other Ambulatory Visit: Payer: Self-pay | Admitting: Cardiology

## 2020-12-09 ENCOUNTER — Other Ambulatory Visit: Payer: Self-pay | Admitting: Family Medicine

## 2020-12-17 ENCOUNTER — Other Ambulatory Visit: Payer: Self-pay

## 2020-12-17 ENCOUNTER — Ambulatory Visit (INDEPENDENT_AMBULATORY_CARE_PROVIDER_SITE_OTHER): Payer: Medicare PPO | Admitting: Family Medicine

## 2020-12-17 ENCOUNTER — Encounter: Payer: Self-pay | Admitting: Family Medicine

## 2020-12-17 VITALS — BP 132/64 | HR 73 | Temp 97.9°F | Wt 209.5 lb

## 2020-12-17 DIAGNOSIS — M7581 Other shoulder lesions, right shoulder: Secondary | ICD-10-CM

## 2020-12-17 DIAGNOSIS — R3914 Feeling of incomplete bladder emptying: Secondary | ICD-10-CM | POA: Diagnosis not present

## 2020-12-17 DIAGNOSIS — R7303 Prediabetes: Secondary | ICD-10-CM

## 2020-12-17 DIAGNOSIS — N401 Enlarged prostate with lower urinary tract symptoms: Secondary | ICD-10-CM

## 2020-12-17 MED ORDER — METHYLPREDNISOLONE ACETATE 40 MG/ML IJ SUSP: 40.0000 mg | Freq: Once | INTRAMUSCULAR | Status: DC

## 2020-12-17 MED ORDER — FINASTERIDE 5 MG PO TABS: 5.0000 mg | ORAL_TABLET | Freq: Every day | ORAL | 0 refills | Status: DC

## 2020-12-17 MED ORDER — METHYLPREDNISOLONE ACETATE 40 MG/ML IJ SUSP
40.0000 mg | Freq: Once | INTRAMUSCULAR | Status: AC
  Administered 2020-12-17: 40 mg via INTRA_ARTICULAR

## 2020-12-17 NOTE — Patient Instructions (Signed)
Rotator Cuff Tendinitis  Rotator cuff tendinitis is inflammation of the tendons in the rotator cuff. Tendons are tough, cord-like bands that connect muscle to bone. The rotator cuff includes all of the muscles and tendons that connect the arm to the shoulder. The rotator cuff holds the head of the humerus, or the upper arm bone, in the cup of the shoulder blade (scapula). This condition can lead to a long-term or chronic tear. The tear may be partial or complete. What are the causes? This condition is usually caused by overusing the rotator cuff. What increases the risk? This condition is more likely to develop in athletes and workers who frequently use their shoulder or reach over their heads. This can include activities such as:  Tennis.  Baseball or softball.  Swimming.  Construction work.  Painting. What are the signs or symptoms? Symptoms of this condition include:  Pain that spreads (radiates) from the shoulder to the upper arm.  Swelling and tenderness in front of the shoulder.  Pain when reaching, pulling, or lifting the arm above the head.  Pain when lowering the arm from above the head.  Minor pain in the shoulder when resting.  Increased pain in the shoulder at night.  Difficulty placing the arm behind the back. How is this diagnosed? This condition is diagnosed with a physical exam and medical history. Tests may also be done, including:  X-rays.  MRI.  Ultrasound.  CT with or without contrast. How is this treated? Treatment for this condition depends on the severity of the condition. In less severe cases, treatment may include:  Rest. This may be done with a sling that holds the shoulder still (immobilization). Your health care provider may also recommend avoiding activities that involve lifting your arm over your head.  Icing the shoulder.  Anti-inflammatory medicines, such as aspirin or ibuprofen. In more severe cases, treatment may  include:  Physical therapy.  Steroid injections.  Surgery. Follow these instructions at home: If you have a sling:  Wear the sling as told by your health care provider. Remove it only as told by your health care provider.  Loosen it if your fingers tingle, become numb, or turn cold and blue.  Keep it clean.  If the sling is not waterproof: ? Do not let it get wet. ? Cover it with a watertight covering when you take a bath or shower. Managing pain, stiffness, and swelling  If directed, put ice on the injured area. To do this: ? If you have a removable sling, remove it as told by your health care provider. ? Put ice in a plastic bag. ? Place a towel between your skin and the bag. ? Leave the ice on for 20 minutes, 2-3 times a day.  Move your fingers often to reduce stiffness and swelling.  Raise (elevate) the injured area above the level of your heart while you are lying down.  Find a comfortable sleeping position, or sleep in a recliner, if available.   Activity  Rest your shoulder as told by your health care provider.  Ask your health care provider when it is safe to drive if you have a sling on your arm.  Return to your normal activities as told by your health care provider. Ask your health care provider what activities are safe for you.  Do any exercises or stretches as told by your health care provider or physical therapist.  If you do repetitive overhead tasks, take small breaks in between and include   stretching exercises as told by your health care provider. General instructions  Do not use any products that contain nicotine or tobacco, such as cigarettes, e-cigarettes, and chewing tobacco. These can delay healing. If you need help quitting, ask your health care provider.  Take over-the-counter and prescription medicines only as told by your health care provider.  Keep all follow-up visits as told by your health care provider. This is important. Contact a health  care provider if:  Your pain gets worse.  You have new pain in your arm, hands, or fingers.  Your pain is not relieved with medicine or does not get better after 6 weeks of treatment.  You have crackling sensations when moving your shoulder in certain directions.  You hear a snapping sound after using your shoulder, followed by severe pain and weakness. Get help right away if:  Your arm, hand, or fingers are numb or tingling.  Your arm, hand, or fingers are swollen or painful or they turn white or blue. Summary  Rotator cuff tendinitis is inflammation of the tendons in the rotator cuff. Tendons are tough, cord-like bands that connect muscle to bone.  This condition is usually caused by overusing the rotator cuff, which includes all of the muscles and tendons that connect the arm to the shoulder.  This condition is more likely to develop in athletes and workers who frequently use their shoulder or reach over their heads.  Treatment generally includes rest, anti-inflammatory medicines, and icing. In some cases, physical therapy and steroid injections may be needed. In severe cases, surgery may be needed. This information is not intended to replace advice given to you by your health care provider. Make sure you discuss any questions you have with your health care provider. Document Revised: 05/16/2019 Document Reviewed: 05/16/2019 Elsevier Patient Education  2021 [employer]  

## 2020-12-17 NOTE — Progress Notes (Signed)
Established Patient Office Visit  Subjective:  Patient ID: [name], male    DOB: [birth year]  Age: 87 y.o. MRN: [id number]  CC:  Chief Complaint  Patient presents with  . Follow-up    Medication follow up    HPI [name] presents for multiple items as follows  History of BPH.  He was on Flomax back few months ago.  He was having slow stream and difficulty emptying at times.  We added finasteride 5 mg daily.  Thus far, he has not seen much change.  No worsening of symptoms but not clearly improved yet.  No recent burning with urination.  Complaining of right shoulder pain.  He was pushing several weeks ago up to get out of a chair and noticed some pain in the right shoulder presents in pain subacromial region since then radiating toward the deltoid.  Pain especially with abduction but slightly with internal rotation.  No definite weakness.  Denies any cervical radiculitis symptoms.  He had recent injection in his low back and feels that his back pain overall is improved.  His chronic problems include history of atrial fibrillation, hypertension, GERD, multiple skin cancers, chronic kidney disease, hyperlipidemia, lumbar stenosis, prediabetes  Past Medical History:  Diagnosis Date  . Actinic keratosis 03/13/2009  . Arthritis   . BENIGN PROSTATIC HYPERTROPHY, HX OF 04/07/2009  . CARCINOMA, SKIN, SQUAMOUS CELL 09/11/2009  . COLONIC POLYPS, HX OF 03/13/2009  . Essential hypertension   . GERD 03/13/2009  . Hyperlipidemia   . INSOMNIA, CHRONIC 09/11/2009  . Persistent atrial fibrillation ([place]) 04/07/2009   a. s/p afib ablation at [facility] x 2 in 2010.  Marland Kitchen POLYPECTOMY, HX OF 04/07/2009  . Premature atrial contractions   . PULMONARY NODULE 09/11/2009  . PVC's (premature ventricular contractions)   . Rosacea 03/13/2009  . SKIN CANCER, HX OF 03/13/2009    Past Surgical History:  Procedure Laterality Date  . ABLATION  2010   x2  . COLONOSCOPY W/ BIOPSIES AND POLYPECTOMY    .  EYE SURGERY Bilateral    cataracts  . LUMBAR DISC SURGERY     RUPTURE  . LUMBAR LAMINECTOMY/DECOMPRESSION MICRODISCECTOMY N/A 04/20/2014   Procedure: LUMBAR TWO-THREE, LUMBAR THREE-FOUR, LUMAR FOUR-FIVE LAMINECTOMIES;  Surgeon: Newman Pies, MD;  Location: [place] [facility];  Service: Neurosurgery;  Laterality: N/A;  . POLYPECTOMY    . SKIN CANCER EXCISION      Family History  Problem Relation Age of Onset  . Cancer Mother   . Cancer Father 52       COLON  . Heart attack Father   . Heart disease Father   . Heart disease Sister   . CAD Sister   . Heart disease Brother   . CAD Brother   . CAD Brother   . Heart disease Brother     Social History   Socioeconomic History  . Marital status: Married    Spouse name: Not on file  . Number of children: Not on file  . Years of education: Not on file  . Highest education level: Not on file  Occupational History  . Not on file  Tobacco Use  . Smoking status: Former Smoker    Packs/day: 1.00    Years: 30.00    Pack years: 30.00    Types: Cigarettes    Quit date: 03/13/1988    Years since quitting: 32.7  . Smokeless tobacco: Current User    Types: Chew  Substance and Sexual Activity  .  Alcohol use: No  . Drug use: No  . Sexual activity: Not on file  Other Topics Concern  . Not on file  Social History Narrative  . Not on file   Social Determinants of Health   Financial Resource Strain: Low Risk   . Difficulty of Paying Living Expenses: Not very hard  Food Insecurity: Not on file  Transportation Needs: No Transportation Needs  . Lack of Transportation (Medical): No  . Lack of Transportation (Non-Medical): No  Physical Activity: Not on file  Stress: Not on file  Social Connections: Not on file  Intimate Partner Violence: Not on file    Outpatient Medications Prior to Visit  Medication Sig Dispense Refill  . acetaminophen (TYLENOL) 500 MG tablet Take 500 mg by mouth every 6 (six) hours as needed for mild pain.    Marland Kitchen  amLODipine (NORVASC) 2.5 MG tablet TAKE 1 TABLET EVERY DAY 90 tablet 1  . clobetasol ointment (TEMOVATE) 7.35 % Apply 1 application topically 2 (two) times daily as needed (Rash).     Marland Kitchen ELIQUIS 2.5 MG TABS tablet TAKE 1 TABLET TWICE DAILY 180 tablet 1  . furosemide (LASIX) 20 MG tablet Take 1 tablet (20 mg total) by mouth daily as needed for fluid.    Marland Kitchen gabapentin (NEURONTIN) 100 MG capsule TAKE 1 CAPSULE IN THE MORNING  AND TAKE 2 CAPSULES AT BEDTIME 270 capsule 0  . glucosamine-chondroitin 500-400 MG tablet Take 2 tablets by mouth daily.    . lansoprazole (PREVACID) 30 MG capsule TAKE 1 CAPSULE EVERY DAY AT 12 NOON 90 capsule 3  . lovastatin (MEVACOR) 20 MG tablet TAKE 1 TABLET (20 MG TOTAL) BY MOUTH AT BEDTIME. 90 tablet 1  . metoprolol succinate (TOPROL-XL) 25 MG 24 hr tablet TAKE 1 TABLET EVERY DAY 90 tablet 1  . metroNIDAZOLE (METROCREAM) 0.75 % cream APPLY TOPICALLY 3 TIMES A  WEEK ON THE DAYS HE SHAVES 45 g 1  . nitroGLYCERIN (NITROSTAT) 0.4 MG SL tablet Place 1 tablet (0.4 mg total) under the tongue every 5 (five) minutes x 3 doses as needed for chest pain. 25 tablet 0  . SYMBICORT 160-4.5 MCG/ACT inhaler TAKE 2 PUFFS BY MOUTH TWICE A DAY 30.6 each 3  . tamsulosin (FLOMAX) 0.4 MG CAPS capsule TAKE 1 CAPSULE EVERY DAY 90 capsule 3  . vitamin B-12 (CYANOCOBALAMIN) 500 MCG tablet Take 500 mcg by mouth daily.    Marland Kitchen VITAMIN D PO Take 400 Units by mouth daily.     . finasteride (PROSCAR) 5 MG tablet Take 1 tablet (5 mg total) by mouth daily. 30 tablet 2   No facility-administered medications prior to visit.    Allergies  Allergen Reactions  . Amoxicillin-Pot Clavulanate Diarrhea and Nausea Only  . Losartan Other (See Comments) and Nausea And Vomiting    Elevated creatinine levels Elevated creatinine levels    ROS Review of Systems  Constitutional: Negative for chills, fatigue and fever.  Eyes: Negative for visual disturbance.  Respiratory: Negative for cough, chest tightness and  shortness of breath.   Cardiovascular: Negative for chest pain, palpitations and leg swelling.  Gastrointestinal: Negative for abdominal pain.  Endocrine: Negative for polydipsia and polyuria.  Musculoskeletal: Positive for arthralgias.  Neurological: Negative for dizziness, syncope, weakness, light-headedness and headaches.      Objective:    Physical Exam Constitutional:      Appearance: He is well-developed.  HENT:     Right Ear: External ear normal.     Left Ear: External ear  normal.  Eyes:     Pupils: Pupils are equal, round, and reactive to light.  Neck:     Thyroid: No thyromegaly.  Cardiovascular:     Rate and Rhythm: Normal rate and regular rhythm.  Pulmonary:     Effort: Pulmonary effort is normal. No respiratory distress.     Breath sounds: Normal breath sounds. No wheezing or rales.  Musculoskeletal:     Cervical back: Neck supple.     Comments: Right shoulder reveals pain with abduction greater than about 80 degrees.  He has some pain with internal rotation.  No localized tenderness.  No proximal biceps tenderness.  No acromioclavicular tenderness.  Neurological:     Mental Status: He is alert and oriented to person, place, and time.     BP 132/64 (BP Location: Left Arm, Patient Position: Sitting, Cuff Size: Normal)   Pulse 73   Temp 97.9 F (36.6 C) (Oral)   Wt 209 lb 8 oz (95 kg)   SpO2 96%   BMI 26.90 kg/m  Wt Readings from Last 3 Encounters:  12/17/20 209 lb 8 oz (95 kg)  11/14/20 213 lb (96.6 kg)  09/19/20 209 lb (94.8 kg)     Health Maintenance Due  Topic Date Due  . COVID-19 Vaccine (3 - Pfizer risk 4-dose series) 10/28/2019  . TETANUS/TDAP  08/29/2020    There are no preventive care reminders to display for this patient.  Lab Results  Component Value Date   TSH 2.32 03/27/2020   Lab Results  Component Value Date   WBC 7.1 03/27/2020   HGB 15.4 03/27/2020   HCT 44.6 03/27/2020   MCV 97.6 03/27/2020   PLT 222 03/27/2020   Lab  Results  Component Value Date   NA 141 11/15/2020   K 4.4 11/15/2020   CO2 21 11/15/2020   GLUCOSE 109 (H) 11/15/2020   BUN 19 11/15/2020   CREATININE 1.23 11/15/2020   BILITOT 0.5 11/15/2020   ALKPHOS 69 11/15/2020   AST 13 11/15/2020   ALT 7 11/15/2020   PROT 6.1 11/15/2020   ALBUMIN 4.2 11/15/2020   CALCIUM 8.9 11/15/2020   ANIONGAP 10 04/07/2019   EGFR 57 (L) 11/15/2020   GFR 49.80 (L) 03/16/2019   Lab Results  Component Value Date   CHOL 131 11/15/2020   Lab Results  Component Value Date   HDL 33 (L) 11/15/2020   Lab Results  Component Value Date   LDLCALC 74 11/15/2020   Lab Results  Component Value Date   TRIG 138 11/15/2020   Lab Results  Component Value Date   CHOLHDL 4.0 11/15/2020   Lab Results  Component Value Date   HGBA1C 5.7 (H) 04/07/2019      Assessment & Plan:   #1 BPH.  Patient currently on Flomax and finasteride.  Has been on finasteride about 3 months now.  Has not seen any clear symptom improvement yet. -We recommend giving this another 3 months or so on the finasteride.  If no improvement at that point consider urology referral  #2 history of prediabetes range blood sugars. -We discussed dietary modification.  He eats a lot of sweets.  We recommend scale back those and consider A1c with next blood draw  #3 right shoulder pain.  Suspect rotator cuff tendinitis.  Cannot rule out partial tear.  At risk for developing adhesive capsulitis   Discussed risks and benefits of corticosteroid injection and patient consented.  After prepping skin with betadine, injected 40 mg depomedrol and 2  cc of plain xylocaine with 25 gauge one and one half inch needle using posterior lateral approach and pt tolerated well. -Recommend some gentle range of motion exercises.  Consider physical therapy if not improving over the next couple weeks.  Meds ordered this encounter  Medications  . finasteride (PROSCAR) 5 MG tablet    Sig: Take 1 tablet (5 mg total) by  mouth daily.    Dispense:  90 tablet    Refill:  0  . DISCONTD: methylPREDNISolone acetate (DEPO-MEDROL) injection 40 mg  . methylPREDNISolone acetate (DEPO-MEDROL) injection 40 mg    Follow-up: Return in about 6 months (around 06/18/2021).    Carolann Littler, MD

## 2020-12-20 DIAGNOSIS — L570 Actinic keratosis: Secondary | ICD-10-CM | POA: Diagnosis not present

## 2020-12-20 DIAGNOSIS — C44622 Squamous cell carcinoma of skin of right upper limb, including shoulder: Secondary | ICD-10-CM | POA: Diagnosis not present

## 2020-12-20 DIAGNOSIS — D485 Neoplasm of uncertain behavior of skin: Secondary | ICD-10-CM | POA: Diagnosis not present

## 2020-12-20 DIAGNOSIS — D0461 Carcinoma in situ of skin of right upper limb, including shoulder: Secondary | ICD-10-CM | POA: Diagnosis not present

## 2020-12-20 DIAGNOSIS — C44729 Squamous cell carcinoma of skin of left lower limb, including hip: Secondary | ICD-10-CM | POA: Diagnosis not present

## 2020-12-20 DIAGNOSIS — C44722 Squamous cell carcinoma of skin of right lower limb, including hip: Secondary | ICD-10-CM | POA: Diagnosis not present

## 2021-01-02 ENCOUNTER — Encounter: Payer: Self-pay | Admitting: Family Medicine

## 2021-01-02 ENCOUNTER — Other Ambulatory Visit: Payer: Self-pay

## 2021-01-02 ENCOUNTER — Ambulatory Visit (INDEPENDENT_AMBULATORY_CARE_PROVIDER_SITE_OTHER): Payer: Medicare PPO | Admitting: Family Medicine

## 2021-01-02 VITALS — BP 130/62 | HR 78 | Temp 97.7°F | Wt 207.5 lb

## 2021-01-02 DIAGNOSIS — M25511 Pain in right shoulder: Secondary | ICD-10-CM | POA: Diagnosis not present

## 2021-01-02 DIAGNOSIS — R739 Hyperglycemia, unspecified: Secondary | ICD-10-CM | POA: Diagnosis not present

## 2021-01-02 LAB — POCT GLYCOSYLATED HEMOGLOBIN (HGB A1C): Hemoglobin A1C: 6.1 % — AB (ref 4.0–5.6)

## 2021-01-02 MED ORDER — GABAPENTIN 100 MG PO CAPS: ORAL_CAPSULE | ORAL | 1 refills | Status: DC

## 2021-01-02 NOTE — Patient Instructions (Signed)
Hyperglycemia Hyperglycemia occurs when the level of sugar (glucose) in the blood is too high. Glucose is a type of sugar that provides the body's main source of energy. Certain hormones (insulin and glucagon) control the level of glucose in the blood. Insulin lowers blood glucose, and glucagon increases blood glucose. Hyperglycemia can result from not having enough insulin in the bloodstream, or from the body not responding normally to insulin. Hyperglycemia occurs most often in people who have diabetes (diabetes mellitus), but it can happen in people who do not have diabetes. It can develop quickly, and it can be life-threatening if it causes you to become severely dehydrated (diabetic ketoacidosis or hyperglycemic hyperosmolar state). Severe hyperglycemia is a medical emergency. For most people with diabetes, a blood glucose level above 240 mg/dL is considered hyperglycemia. What are the causes? If you have diabetes, hyperglycemia may be caused by:  Medicines that increase blood glucose or affect your diabetes control.  Getting less physical activity.  Eating more than planned.  Being sick or injured, having an infection, or having surgery.  Stress.  Not giving yourself enough insulin (if you are taking insulin). If you have undiagnosed diabetes, this may be the reason you have hyperglycemia. If you do not have diabetes, hyperglycemia may be caused by:  Certain medicines, including: ? Steroid medicines. ? Beta-blockers. ? Epinephrine. ? Thiazide diuretics.  Stress.  Having a serious illness, an infection, or surgery.  Diseases of the pancreas. What increases the risk? Hyperglycemia is more likely to develop in people who have risk factors for diabetes, such as:  Having a family member with diabetes.  Certain conditions in which the body's disease-fighting system (immune system) attacks itself (autoimmune disorders).  Being overweight or obese.  Having an inactive  (sedentary) lifestyle.  Having been diagnosed with insulin resistance.  Having a history of prediabetes, gestational diabetes, or polycystic ovarian syndrome (PCOS). What are the signs or symptoms? Hyperglycemia may not cause any symptoms. If you do have symptoms, they may include:  Increased thirst.  Needing to urinate more often than usual.  Hunger.  Feeling very tired.  Blurry vision. Other symptoms may develop if hyperglycemia gets worse, such as:  Dry mouth.  Abdominal pain.  Loss of appetite.  Fruity-smelling breath.  Weakness.  Unexpected weight loss.  Tingling or numbness in the hands or feet.  Headache.  Cuts or bruises that are slow to heal. How is this diagnosed? Hyperglycemia is diagnosed with a blood test to measure your blood glucose level. This blood test is usually done while you are having symptoms. Your health care provider may also do a physical exam and review your medical history. You may have more tests to determine the cause of your hyperglycemia, such as:  A fasting blood glucose (FBG) test. You will not be allowed to eat (you will fast) for at least 8 hours before a blood sample is taken.  An A1C blood test. This provides information about blood glucose control over the previous 2-3 months.  An oral glucose tolerance test (OGTT). This measures your blood glucose at two times: ? After fasting. This is your baseline blood glucose level. ? 2 hours after drinking a beverage that contains glucose. How is this treated? Treatment depends on the cause of your hyperglycemia. Treatment may include:  Taking medicine to regulate your blood glucose levels. If you take insulin or other diabetes medicines, your medicine or dosage may be adjusted.  Lifestyle changes, such as exercising more, eating healthier foods, or losing  weight.  Treating an illness or infection.  Checking your blood glucose more often.  Stopping or reducing steroid  medicines. If your hyperglycemia becomes severe and it results in diabetic ketoacidosis or hyperglycemic hyperosmolar state, you must be hospitalized and given IV fluids and IV insulin. Follow these instructions at home: General instructions  Take over-the-counter and prescription medicines only as told by your health care provider.  Do not use any products that contain nicotine or tobacco. These products include cigarettes, chewing tobacco, and vaping devices, such as e-cigarettes. If you need help quitting, ask your health care provider.  If you drink alcohol: ? Limit how much you have to:  0-1 drink a day for women who are not pregnant.  0-2 drinks a day for men. ? Know how much alcohol is in a drink. In the [place] one drink equals one 12 oz bottle of beer (355 mL), one 5 oz glass of wine (148 mL), or one 1 oz glass of hard liquor (44 mL).  Learn to manage stress. If you need help with this, ask your health care provider.  Do exercises as told by your health care provider.  Keep all follow-up visits. This is important. Eating and drinking  Maintain a healthy weight.  Stay hydrated, especially when you exercise, get sick, or spend time in hot temperatures.  Drink enough fluid to keep your urine pale yellow.   If you have diabetes:  Know the symptoms of hyperglycemia.  Follow your diabetes management plan as told by your health care provider. Make sure you: ? Take your insulin and medicines as told. ? Follow your exercise plan. ? Follow your meal plan. Eat on time, and do not skip meals. ? Check your blood glucose as often as told. Make sure to check your blood glucose before and after exercise. If you exercise longer or in a different way, check your blood glucose more often. ? Follow your sick day plan whenever you cannot eat or drink normally. Make this plan in advance with your health care provider.  Share your diabetes management plan with people in your workplace,  school, and household.  Check your urine for ketones when you are ill and as told by your health care provider.  Carry a medical alert card or wear medical alert jewelry.   Where to find more information American Diabetes Association: www.diabetes.org Contact a health care provider if:  Your blood glucose is at or above 240 mg/dL (13.3 mmol/L) for 2 days in a row.  You have problems keeping your blood glucose in your target range.  You have frequent episodes of hyperglycemia.  You have signs of illness, such as nausea, vomiting, or fever. Get help right away if:  Your blood glucose monitor reads "high" even when you are taking insulin.  You have trouble breathing.  You have a change in how you think, feel, or act (mental status).  You have nausea or vomiting that does not go away. These symptoms may represent a serious problem that is an emergency. Do not wait to see if the symptoms will go away. Get medical help right away. Call your local emergency services (911 in the [place] Do not drive yourself to the hospital. Summary  Hyperglycemia occurs when the level of sugar (glucose) in the blood is too high.  Hyperglycemia can happen with or without diabetes, and severe hyperglycemia can be life-threatening.  Hyperglycemia is diagnosed with a blood test to measure your blood glucose level. This blood test  is usually done while you are having symptoms. Your health care provider may also do a physical exam and review your medical history.  If you have diabetes, follow your diabetes management plan as told by your health care provider.  Contact your health care provider if you have problems keeping your blood glucose in your target range. This information is not intended to replace advice given to you by your health care provider. Make sure you discuss any questions you have with your health care provider. Document Revised: 05/25/2020 Document Reviewed: 05/25/2020 Elsevier Patient  Education  2021 [employer].

## 2021-01-02 NOTE — Progress Notes (Signed)
Established Patient Office Visit  Subjective:  Patient ID: [name], male    DOB: 1931-10-05  Age: 87 y.o. MRN: [id number]  CC:  Chief Complaint  Patient presents with  . Shoulder Pain    Reoccurring issue, pt states Dr. Caryl Never did an injection last time and that helped but now the pain is back worse than it has ever been     HPI [name] presents for worsening right shoulder pain.  He was seen April 25 complaining of right shoulder pain.  He been pushing up out of a chair several weeks prior to that and noticed sharp pain right shoulder subacromial region.  He had radiation toward the deltoid region.  Pain worse with abduction.  Cannot rule out partial tear.  Suspected tendinitis.  At risk for adhesive capsulitis.  We injected right subacromial space with Depo-Medrol and Xylocaine.  He obtained significant relief but only for about 4 or 5 days and since then pain is gradually worsened.  He did have another injury a few days ago.  He is walking with a cane and got his cane out in front of him and stumbled and felt a jolt of pain through his right shoulder.  Pain has been worse since then.  Pain especially bothersome at night.  Very limited abduction secondary to pain.  History of hyperglycemia by multiple recent labs.  No polyuria or polydipsia.  We discussed getting A1c at follow-up.  He states he does have a "sweet tooth ".  Poor dietary compliance at times.  Past Medical History:  Diagnosis Date  . Actinic keratosis 03/13/2009  . Arthritis   . BENIGN PROSTATIC HYPERTROPHY, HX OF 04/07/2009  . CARCINOMA, SKIN, SQUAMOUS CELL 09/11/2009  . COLONIC POLYPS, HX OF 03/13/2009  . Essential hypertension   . GERD 03/13/2009  . Hyperlipidemia   . INSOMNIA, CHRONIC 09/11/2009  . Persistent atrial fibrillation (HCC) 04/07/2009   a. s/p afib ablation at [facility] x 2 in 2010.  Marland Kitchen POLYPECTOMY, HX OF 04/07/2009  . Premature atrial contractions   . PULMONARY NODULE 09/11/2009  . PVC's  (premature ventricular contractions)   . Rosacea 03/13/2009  . SKIN CANCER, HX OF 03/13/2009    Past Surgical History:  Procedure Laterality Date  . ABLATION  2010   x2  . COLONOSCOPY W/ BIOPSIES AND POLYPECTOMY    . EYE SURGERY Bilateral    cataracts  . LUMBAR DISC SURGERY     RUPTURE  . LUMBAR LAMINECTOMY/DECOMPRESSION MICRODISCECTOMY N/A 04/20/2014   Procedure: LUMBAR TWO-THREE, LUMBAR THREE-FOUR, LUMAR FOUR-FIVE LAMINECTOMIES;  Surgeon: Tressie Stalker, MD;  Location: [facility];  Service: Neurosurgery;  Laterality: N/A;  . POLYPECTOMY    . SKIN CANCER EXCISION      Family History  Problem Relation Age of Onset  . Cancer Mother   . Cancer Father 14       COLON  . Heart attack Father   . Heart disease Father   . Heart disease Sister   . CAD Sister   . Heart disease Brother   . CAD Brother   . CAD Brother   . Heart disease Brother     Social History   Socioeconomic History  . Marital status: Married    Spouse name: Not on file  . Number of children: Not on file  . Years of education: Not on file  . Highest education level: Not on file  Occupational History  . Not on file  Tobacco Use  .  Smoking status: Former Smoker    Packs/day: 1.00    Years: 30.00    Pack years: 30.00    Types: Cigarettes    Quit date: 03/13/1988    Years since quitting: 32.8  . Smokeless tobacco: Current User    Types: Chew  Substance and Sexual Activity  . Alcohol use: No  . Drug use: No  . Sexual activity: Not on file  Other Topics Concern  . Not on file  Social History Narrative  . Not on file   Social Determinants of Health   Financial Resource Strain: Low Risk   . Difficulty of Paying Living Expenses: Not very hard  Food Insecurity: Not on file  Transportation Needs: No Transportation Needs  . Lack of Transportation (Medical): No  . Lack of Transportation (Non-Medical): No  Physical Activity: Not on file  Stress: Not on file  Social Connections: Not on file  Intimate  Partner Violence: Not on file    Outpatient Medications Prior to Visit  Medication Sig Dispense Refill  . acetaminophen (TYLENOL) 500 MG tablet Take 500 mg by mouth every 6 (six) hours as needed for mild pain.    Marland Kitchen amLODipine (NORVASC) 2.5 MG tablet TAKE 1 TABLET EVERY DAY 90 tablet 1  . clobetasol ointment (TEMOVATE) 9.35 % Apply 1 application topically 2 (two) times daily as needed (Rash).     Marland Kitchen ELIQUIS 2.5 MG TABS tablet TAKE 1 TABLET TWICE DAILY 180 tablet 1  . finasteride (PROSCAR) 5 MG tablet Take 1 tablet (5 mg total) by mouth daily. 90 tablet 0  . furosemide (LASIX) 20 MG tablet Take 1 tablet (20 mg total) by mouth daily as needed for fluid.    Marland Kitchen glucosamine-chondroitin 500-400 MG tablet Take 2 tablets by mouth daily.    . lansoprazole (PREVACID) 30 MG capsule TAKE 1 CAPSULE EVERY DAY AT 12 NOON 90 capsule 3  . lovastatin (MEVACOR) 20 MG tablet TAKE 1 TABLET (20 MG TOTAL) BY MOUTH AT BEDTIME. 90 tablet 1  . metoprolol succinate (TOPROL-XL) 25 MG 24 hr tablet TAKE 1 TABLET EVERY DAY 90 tablet 1  . metroNIDAZOLE (METROCREAM) 0.75 % cream APPLY TOPICALLY 3 TIMES A  WEEK ON THE DAYS HE SHAVES 45 g 1  . nitroGLYCERIN (NITROSTAT) 0.4 MG SL tablet Place 1 tablet (0.4 mg total) under the tongue every 5 (five) minutes x 3 doses as needed for chest pain. 25 tablet 0  . SYMBICORT 160-4.5 MCG/ACT inhaler TAKE 2 PUFFS BY MOUTH TWICE A DAY 30.6 each 3  . tamsulosin (FLOMAX) 0.4 MG CAPS capsule TAKE 1 CAPSULE EVERY DAY 90 capsule 3  . vitamin B-12 (CYANOCOBALAMIN) 500 MCG tablet Take 500 mcg by mouth daily.    Marland Kitchen VITAMIN D PO Take 400 Units by mouth daily.     Marland Kitchen gabapentin (NEURONTIN) 100 MG capsule TAKE 1 CAPSULE IN THE MORNING  AND TAKE 2 CAPSULES AT BEDTIME 270 capsule 0   No facility-administered medications prior to visit.    Allergies  Allergen Reactions  . Amoxicillin-Pot Clavulanate Diarrhea and Nausea Only  . Losartan Other (See Comments) and Nausea And Vomiting    Elevated creatinine  levels Elevated creatinine levels    ROS Review of Systems  Respiratory: Negative for shortness of breath.   Cardiovascular: Negative for chest pain.  Endocrine: Negative for polydipsia and polyuria.  Musculoskeletal: Negative for neck pain.      Objective:    Physical Exam Vitals reviewed.  Constitutional:      Appearance:  Normal appearance.  Cardiovascular:     Rate and Rhythm: Normal rate.  Pulmonary:     Effort: Pulmonary effort is normal.     Breath sounds: Normal breath sounds.  Musculoskeletal:     Comments: Right shoulder reveals no localized tenderness.  He has difficulty with abducting greater than about 45 degrees secondary to pain.  Much less pain with internal rotation.  Rotator cuff strength is somewhat difficult to assess but he generally seems weak bilaterally.  Neurological:     Mental Status: He is alert.     BP 130/62 (BP Location: Left Arm, Patient Position: Sitting, Cuff Size: Normal)   Pulse 78   Temp 97.7 F (36.5 C) (Oral)   Wt 207 lb 8 oz (94.1 kg)   SpO2 96%   BMI 26.64 kg/m  Wt Readings from Last 3 Encounters:  01/02/21 207 lb 8 oz (94.1 kg)  12/17/20 209 lb 8 oz (95 kg)  11/14/20 213 lb (96.6 kg)     Health Maintenance Due  Topic Date Due  . COVID-19 Vaccine (3 - Pfizer risk 4-dose series) 10/28/2019  . TETANUS/TDAP  08/29/2020    There are no preventive care reminders to display for this patient.  Lab Results  Component Value Date   TSH 2.32 03/27/2020   Lab Results  Component Value Date   WBC 7.1 03/27/2020   HGB 15.4 03/27/2020   HCT 44.6 03/27/2020   MCV 97.6 03/27/2020   PLT 222 03/27/2020   Lab Results  Component Value Date   NA 141 11/15/2020   K 4.4 11/15/2020   CO2 21 11/15/2020   GLUCOSE 109 (H) 11/15/2020   BUN 19 11/15/2020   CREATININE 1.23 11/15/2020   BILITOT 0.5 11/15/2020   ALKPHOS 69 11/15/2020   AST 13 11/15/2020   ALT 7 11/15/2020   PROT 6.1 11/15/2020   ALBUMIN 4.2 11/15/2020   CALCIUM 8.9  11/15/2020   ANIONGAP 10 04/07/2019   EGFR 57 (L) 11/15/2020   GFR 49.80 (L) 03/16/2019   Lab Results  Component Value Date   CHOL 131 11/15/2020   Lab Results  Component Value Date   HDL 33 (L) 11/15/2020   Lab Results  Component Value Date   LDLCALC 74 11/15/2020   Lab Results  Component Value Date   TRIG 138 11/15/2020   Lab Results  Component Value Date   CHOLHDL 4.0 11/15/2020   Lab Results  Component Value Date   HGBA1C 6.1 (A) 01/02/2021      Assessment & Plan:   Problem List Items Addressed This Visit   None   Visit Diagnoses    Right shoulder pain, unspecified chronicity    -  Primary   Relevant Orders   Ambulatory referral to Sports Medicine   Hyperglycemia       Relevant Orders   POCT glycosylated hemoglobin (Hb A1C) (Completed)    Worsening right shoulder pain.  Recommend sports medicine referral to further assess anatomy.  May have partial or even complete tear.  He is definitely interested in more conservative management.  Again discussed our concerns regarding evolving adhesive capsulitis. We also discussed possible referral to physical therapy but he would like to go and see sports medicine first.  A1c today 6.1%.  Discussed reducing high glycemic foods and this should be monitored at least once yearly  Meds ordered this encounter  Medications  . gabapentin (NEURONTIN) 100 MG capsule    Sig: TAKE 1 CAPSULE IN THE MORNING  AND TAKE  2 CAPSULES AT BEDTIME    Dispense:  90 capsule    Refill:  1    Follow-up: No follow-ups on file.    Carolann Littler, MD

## 2021-01-04 DIAGNOSIS — D0461 Carcinoma in situ of skin of right upper limb, including shoulder: Secondary | ICD-10-CM | POA: Diagnosis not present

## 2021-01-04 NOTE — Progress Notes (Signed)
I, [name], LAT, ATC acting as a scribe for Steven Leader, MD. This visit occurred during the SARS-CoV-2 public health emergency.  Safety protocols were in place, including screening questions prior to the visit, additional usage of staff PPE, and extensive cleaning of exam room while observing appropriate contact time as indicated for disinfecting solutions.  Subjective:    I'm seeing this patient as a consultation for: Dr. Carolann Holder. Note will be routed back to referring provider/PCP.  CC: Right shoulder pain  HPI: Pt is an 87 y/o male c/o R shoulder pain ongoing since early April. Pt noticed pain when using his arms to push up to standing from a chair. Pt reports increased pain w/ AROM ABD, especially overhead. Patient had injection that did not provide any relief. Uses Tylenol. Pt locates pain to anteriolateral aspect. Denies any radiating symptoms.     Past medical history, Surgical history, Family history, Social history, Allergies, and medications have been entered into the medical record, reviewed.   Review of Systems: No new headache, visual changes, nausea, vomiting, diarrhea, constipation, dizziness, abdominal pain, skin rash, fevers, chills, night sweats, weight loss, swollen lymph nodes, body aches, joint swelling, muscle aches, chest pain, shortness of breath, mood changes, visual or auditory hallucinations.   Objective:    Vitals:   01/07/21 1352  BP: 128/86  Pulse: 67  SpO2: 93%   General: Well Developed, well nourished, and in no acute distress.  Neuro/Psych: Alert and oriented x3, extra-ocular muscles intact, able to move all 4 extremities, sensation grossly intact. Skin: Warm and dry, no rashes noted.  Respiratory: Not using accessory muscles, speaking in full sentences, trachea midline.  Cardiovascular: Pulses palpable, no extremity edema. Abdomen: Does not appear distended. MSK: Right shoulder normal-appearing Range of motion 140 degrees abduction  normal external rotation internal rotation lumbar spine. Strength 4/5 abduction and external rotation normal internal rotation. Positive Hawkins and Neer's test.  Positive empty can test. Pulses capillary fill and sensation are intact distally.  Lab and Radiology Results  X-ray images right shoulder obtained today personally and independently interpreted Mild AC DJD.  No severe glenohumeral DJD.  No acute fractures. Await formal radiology review  Diagnostic Limited MSK Ultrasound of: Right shoulder Intact biceps tendon. Subscapularis tendon is intact. Supraspinatus tendon is intact.   Infraspinatus tendon is intact. Impression: No large retracted rotator cuff tear.   Impression and Recommendations:    Assessment and Plan: 87 y.o. male with right shoulder pain.  Patient has rotator cuff tendinopathy physical exam signs and symptoms.  He may have a small rotator cuff tear not seen on ultrasound examination today but it is very likely that he has a large full-thickness retracted rotator cuff tear.  Plan for physical therapy referral.  Recheck back in about 6 weeks.  Return sooner if needed.Marland Kitchen  PDMP not reviewed this encounter. Orders Placed This Encounter  Procedures  . [place] LIMITED JOINT SPACE STRUCTURES UP RIGHT(NO LINKED CHARGES)    Order Specific Question:   Reason for Exam (SYMPTOM  OR DIAGNOSIS REQUIRED)    Answer:   right shoulder pain    Order Specific Question:   Preferred imaging location?    Answer:   [place]  . [place] LIMITED JOINT SPACE STRUCTURES UP RIGHT(NO LINKED CHARGES)    Order Specific Question:   Reason for Exam (SYMPTOM  OR DIAGNOSIS REQUIRED)    Answer:   right shoulder pain    Order Specific Question:   Preferred imaging location?  Answer:   [facility]  . DG Shoulder Right    Standing Status:   Future    Number of Occurrences:   1    Standing Expiration Date:   01/07/2022    Order Specific Question:    Reason for Exam (SYMPTOM  OR DIAGNOSIS REQUIRED)    Answer:   eval shoudler pain    Order Specific Question:   Preferred imaging location?    Answer:   [facility]  . Ambulatory referral to Physical Therapy    Referral Priority:   Routine    Referral Type:   Physical Medicine    Referral Reason:   Specialty Services Required    Requested Specialty:   Physical Therapy   No orders of the defined types were placed in this encounter.   Discussed warning signs or symptoms. Please see discharge instructions. Patient expresses understanding.   The above documentation has been reviewed and is accurate and complete [name], M.D.

## 2021-01-07 ENCOUNTER — Ambulatory Visit: Payer: Self-pay

## 2021-01-07 ENCOUNTER — Encounter: Payer: Self-pay | Admitting: Family Medicine

## 2021-01-07 ENCOUNTER — Other Ambulatory Visit: Payer: Self-pay

## 2021-01-07 ENCOUNTER — Ambulatory Visit (INDEPENDENT_AMBULATORY_CARE_PROVIDER_SITE_OTHER): Payer: Medicare PPO | Admitting: Family Medicine

## 2021-01-07 ENCOUNTER — Ambulatory Visit (INDEPENDENT_AMBULATORY_CARE_PROVIDER_SITE_OTHER): Payer: Medicare PPO

## 2021-01-07 VITALS — BP 128/86 | HR 67 | Ht 74.0 in | Wt 217.0 lb

## 2021-01-07 DIAGNOSIS — M25511 Pain in right shoulder: Secondary | ICD-10-CM

## 2021-01-07 DIAGNOSIS — M19011 Primary osteoarthritis, right shoulder: Secondary | ICD-10-CM | POA: Diagnosis not present

## 2021-01-07 NOTE — Patient Instructions (Signed)
Thank you for coming in today.   I've referred you to Physical Therapy.  Let us know if you don't hear from them in one week.   Please get an Xray today before you leave   Recheck  in 6 weeks.    

## 2021-01-09 ENCOUNTER — Ambulatory Visit: Payer: Medicare PPO | Attending: Family Medicine

## 2021-01-09 ENCOUNTER — Ambulatory Visit: Payer: Medicare PPO | Admitting: Family Medicine

## 2021-01-09 ENCOUNTER — Other Ambulatory Visit: Payer: Self-pay

## 2021-01-09 DIAGNOSIS — M25511 Pain in right shoulder: Secondary | ICD-10-CM | POA: Insufficient documentation

## 2021-01-09 DIAGNOSIS — R252 Cramp and spasm: Secondary | ICD-10-CM | POA: Insufficient documentation

## 2021-01-09 DIAGNOSIS — R293 Abnormal posture: Secondary | ICD-10-CM | POA: Insufficient documentation

## 2021-01-09 DIAGNOSIS — M6281 Muscle weakness (generalized): Secondary | ICD-10-CM | POA: Diagnosis not present

## 2021-01-09 NOTE — Therapy (Signed)
[facility] [address] [place], [place], [address] Phone: [phone]   Fax:  [phone]  Physical Therapy Evaluation  Patient Details  Name: [name] MRN: [id number] Date of Birth: 1931/08/30 Referring Provider (PT): Lynne Leader MD   Encounter Date: 01/09/2021   PT End of Session - 01/09/21 1048    Visit Number 1    Authorization Type Cohere    Progress Note Due on Visit 10    PT Start Time 2376    PT Stop Time 1100    PT Time Calculation (min) 45 min    Activity Tolerance Patient tolerated treatment well;No increased pain    Behavior During Therapy WFL for tasks assessed/performed           Past Medical History:  Diagnosis Date  . Actinic keratosis 03/13/2009  . Arthritis   . BENIGN PROSTATIC HYPERTROPHY, HX OF 04/07/2009  . CARCINOMA, SKIN, SQUAMOUS CELL 09/11/2009  . COLONIC POLYPS, HX OF 03/13/2009  . Essential hypertension   . GERD 03/13/2009  . Hyperlipidemia   . INSOMNIA, CHRONIC 09/11/2009  . Persistent atrial fibrillation ([place]) 04/07/2009   a. s/p afib ablation at [facility] x 2 in 2010.  Marland Kitchen POLYPECTOMY, HX OF 04/07/2009  . Premature atrial contractions   . PULMONARY NODULE 09/11/2009  . PVC's (premature ventricular contractions)   . Rosacea 03/13/2009  . SKIN CANCER, HX OF 03/13/2009    Past Surgical History:  Procedure Laterality Date  . ABLATION  2010   x2  . COLONOSCOPY W/ BIOPSIES AND POLYPECTOMY    . EYE SURGERY Bilateral    cataracts  . LUMBAR DISC SURGERY     RUPTURE  . LUMBAR LAMINECTOMY/DECOMPRESSION MICRODISCECTOMY N/A 04/20/2014   Procedure: LUMBAR TWO-THREE, LUMBAR THREE-FOUR, LUMAR FOUR-FIVE LAMINECTOMIES;  Surgeon: Newman Pies, MD;  Location: [facility];  Service: Neurosurgery;  Laterality: N/A;  . POLYPECTOMY    . SKIN CANCER EXCISION      There were no vitals filed for this visit.    Subjective Assessment - 01/09/21 1024    Subjective pt reports he has pain in R  shoulder with pushing up from chair, overuse of R UE and with overhead reaching and shoulder abduction. pt reports this has been going on ~1 month with rest being best relief.    Patient Stated Goals to reduce shoulder pain and be more mobile    Currently in Pain? Yes    Pain Score 5     Pain Location Shoulder    Pain Orientation Right    Pain Descriptors / Indicators Shooting;Sharp    Pain Type Acute pain    Pain Radiating Towards elbow, infrequently    Pain Onset More than a month ago    Pain Frequency Intermittent    Aggravating Factors  pushing up from chair, "certian movements"    Pain Relieving Factors rest              [facility] PT Assessment - 01/09/21 0001      Assessment   Medical Diagnosis M25.511    Referring Provider (PT) Lynne Leader MD    Onset Date/Surgical Date 11/26/20    Hand Dominance Right    Prior Therapy none      Precautions   Precautions Fall      Restrictions   Weight Bearing Restrictions No      Balance Screen   Has the patient fallen in the past 6 months No    Has the patient  had a decrease in activity level because of a fear of falling?  No    Is the patient reluctant to leave their home because of a fear of falling?  No      Home Environment   Living Environment Private residence    Living Arrangements Spouse/significant other    Type of Bier to enter    Entrance Stairs-Number of Steps 5    Entrance Stairs-Rails Can reach both      Prior Function   Level of Independence Independent;Requires assistive device for independence    Vocation Retired      Associate Professor   Overall Cognitive Status Within Functional Limits for tasks assessed      Observation/Other Assessments   Focus on Therapeutic Outcomes (FOTO)  will do next      Sensation   Light Touch Appears Intact      Posture/Postural Control   Posture/Postural Control Postural limitations    Postural Limitations Rounded Shoulders;Increased thoracic  kyphosis;Forward head      ROM / Strength   AROM / PROM / Strength AROM;Strength;PROM      AROM   Overall AROM  Deficits    Overall AROM Comments Rt=Lt except flexion and abduction.    AROM Assessment Site Shoulder    Right/Left Shoulder Right;Left    Right Shoulder Flexion 95 Degrees    Left Shoulder Flexion 113 Degrees      PROM   Overall PROM  Deficits    Overall PROM Comments IR/ER full and Rt=Lt    PROM Assessment Site Shoulder    Right/Left Shoulder Right;Left    Right Shoulder Flexion 97 Degrees    Left Shoulder Flexion 120 Degrees      Strength   Strength Assessment Site Shoulder;Elbow    Right/Left Shoulder Right;Left    Right Shoulder Flexion 3+/5    Right Shoulder Extension 3+/5    Right Shoulder ABduction 3+/5    Right Shoulder Internal Rotation 3+/5    Right Shoulder External Rotation 3+/5    Left Shoulder Flexion 4+/5    Left Shoulder Extension 4+/5    Left Shoulder ABduction 4+/5    Left Shoulder Internal Rotation 4+/5    Left Shoulder External Rotation 4+/5    Right/Left Elbow Left;Right    Right Elbow Flexion 4+/5    Right Elbow Extension 4+/5    Left Elbow Flexion 4+/5    Left Elbow Extension 4+/5      Palpation   Palpation comment pain with palpation at lateral Rt shoulder, (-) with anterior and posterior shoulder region and scapula      Ambulation/Gait   Ambulation/Gait Yes    Assistive device Small based quad cane    Gait Pattern Step-through pattern                      Objective measurements completed on examination: See above findings.               PT Education - 01/09/21 1042    Education Details Access Code: [email or web address]    Person(s) Educated Patient    Methods Explanation;Demonstration;Handout    Comprehension Verbalized understanding;Returned demonstration            PT Short Term Goals - 01/09/21 1128      PT SHORT TERM GOAL #1   Title pt to demonstrate improved Rt shoulder flexion A/ROM > or = 100     Period Weeks  Status New    Target Date 02/06/21      PT SHORT TERM GOAL #2   Title pt to report  improved pain levels less than 5/10 during 50% of activity    Time 4    Period Weeks    Status New    Target Date 02/06/21      PT SHORT TERM GOAL #3   Title pt to be I with  initial HEP    Time 4    Period Weeks    Status New    Target Date 02/06/21             PT Long Term Goals - 01/09/21 1102      PT LONG TERM GOAL #1   Title pt to demonstrate R shoulder flexion A/ROM > or = 105 to improve overhead reaching    Time 8    Period Weeks    Status New    Target Date 03/06/21      PT LONG TERM GOAL #2   Title pt to report improved pain to less than 3/10 with 75% of activity    Time 8    Period Weeks    Status New    Target Date 03/06/21      PT LONG TERM GOAL #3   Title pt to be I with advanced HEP    Time 8    Period Weeks    Status New    Target Date 03/06/21      PT LONG TERM GOAL #4   Title pt to demonstrate improved Rt shoulder global strength to at least 4/5 for improved function with transfers   Simultaneous filing. User may not have seen previous data.   Time 8   Simultaneous filing. User may not have seen previous data.   Period Weeks   Simultaneous filing. User may not have seen previous data.   Status New    Target Date 03/06/21   Simultaneous filing. User may not have seen previous data.                 Plan - 01/09/21 1049    Clinical Impression Statement Pt presented to PT today with Rt shoulder pain ~5/10 unless at complete rest. Pt reported pain with mobility in Rt shoulder, worse with overuse and pushing. Pt demonstrated pain with PROM and AROM in R shoulder compared to Lt, limiting function and mobility. Pt demonstrated weakness in shoulder flexion, abduction, adduction, extension, internal and external rotation compared to Lt, grossly poor posture and decreased tolerance to activity. pt would benefit from skilled PT to promote improved  tolerance to activity, strengthening, ROM, functional mobility, pain management.    Personal Factors and Comorbidities Age;Fitness;Past/Current Experience    Examination-Activity Limitations Reach Overhead    Examination-Participation Restrictions Cleaning;Community Activity    Stability/Clinical Decision Making Stable/Uncomplicated    Clinical Decision Making Low    Rehab Potential Good    PT Frequency 2x / week    PT Duration 8 weeks    PT Treatment/Interventions ADLs/Self Care Home Management;DME Instruction;Gait training;Stair training;Functional mobility training;Neuromuscular re-education;Therapeutic exercise;Therapeutic activities;Manual techniques;Passive range of motion;Energy conservation;Moist Heat;Electrical Stimulation;Cryotherapy;Dry needling;Iontophoresis 4mg /ml Dexamethasone    PT Next Visit Plan cane adjustment, education on switching hands to L for improved pain, PROM/manual R shoulder, give FOTO    PT Home Exercise Plan Access Code: [email or web address]    Consulted and Agree with Plan of Care Patient           Patient will  benefit from skilled therapeutic intervention in order to improve the following deficits and impairments:  Decreased activity tolerance,Pain,Impaired flexibility,Decreased strength,Decreased range of motion,Decreased mobility  Visit Diagnosis: Muscle weakness (generalized) - Plan: PT plan of care cert/re-cert  Abnormal posture - Plan: PT plan of care cert/re-cert  Cramp and spasm - Plan: PT plan of care cert/re-cert  Acute pain of right shoulder - Plan: PT plan of care cert/re-cert     Problem List Patient Active Problem List   Diagnosis Date Noted  . Ventricular bigeminy 04/08/2019  . Unstable angina ([place]) 04/07/2019  . Right sciatic nerve pain 07/18/2015  . Recurrent vertigo 07/18/2015  . BPH (benign prostatic hyperplasia) 06/22/2015  . CKD (chronic kidney disease) stage 3, GFR 30-59 ml/min (HCC) 12/22/2014  . Lumbar stenosis with neurogenic  claudication 04/20/2014  . Spinal stenosis of lumbar region 12/15/2013  . Need for shingles vaccine 08/31/2012  . Prediabetes 10/16/2011  . CARCINOMA, SKIN, SQUAMOUS CELL 09/11/2009  . INSOMNIA, CHRONIC 09/11/2009  . PULMONARY NODULE 09/11/2009  . ATRIAL FIBRILLATION 04/07/2009  . BENIGN PROSTATIC HYPERTROPHY, HX OF 04/07/2009  . POLYPECTOMY, HX OF 04/07/2009  . Hyperlipidemia 03/13/2009  . Essential hypertension 03/13/2009  . GERD 03/13/2009  . ROSACEA 03/13/2009  . ACTINIC KERATOSIS 03/13/2009  . SKIN CANCER, HX OF 03/13/2009  . COLONIC POLYPS, HX OF 03/13/2009    Sigurd Sos, PT 01/09/21 12:58 PM  [facility] [address] [place], [place], [address] Phone: [phone]   Fax:  [phone]  Name: RONAL MAYBURY MRN: [id number] Date of Birth: 04-12-1932

## 2021-01-09 NOTE — Progress Notes (Signed)
Right shoulder x-ray shows mild arthritis of the small joint at the top of the shoulder.  Otherwise it looks normal to radiology

## 2021-01-09 NOTE — Patient Instructions (Signed)
Access Code: [email or web address] URL: [email or web address] Date: 01/09/2021 Prepared by: Claiborne Wagar  Exercises Scapular Retraction with Resistance - 2 x daily - 7 x weekly - 2 sets - 10 reps Seated Scapular Retraction - 5 x daily - 7 x weekly - 1 sets - 10 reps - 5 hold Shoulder extension with resistance - Neutral - 2 x daily - 7 x weekly - 2 sets - 10 reps Seated Correct Posture - 1 x daily - 7 x weekly - 3 sets - 10 reps Supine Shoulder Flexion AAROM with Hands Clasped - 2 x daily - 7 x weekly - 1 sets - 10 reps - 10 hold Shoulder Flexion Wall Slide with Towel - 1 x daily - 7 x weekly - 1 sets - 10 reps - 5 hold

## 2021-01-11 ENCOUNTER — Other Ambulatory Visit: Payer: Self-pay

## 2021-01-11 ENCOUNTER — Ambulatory Visit: Payer: Medicare PPO | Admitting: Physical Therapy

## 2021-01-11 ENCOUNTER — Encounter: Payer: Self-pay | Admitting: Physical Therapy

## 2021-01-11 DIAGNOSIS — R293 Abnormal posture: Secondary | ICD-10-CM

## 2021-01-11 DIAGNOSIS — R252 Cramp and spasm: Secondary | ICD-10-CM | POA: Diagnosis not present

## 2021-01-11 DIAGNOSIS — M25511 Pain in right shoulder: Secondary | ICD-10-CM | POA: Diagnosis not present

## 2021-01-11 DIAGNOSIS — M6281 Muscle weakness (generalized): Secondary | ICD-10-CM | POA: Diagnosis not present

## 2021-01-11 NOTE — Therapy (Signed)
[facility] [address], [place], [place], [address] Phone: [phone]   Fax:  [phone]  Physical Therapy Treatment  Patient Details  Name: [name] MRN: [id number] Date of Birth: April 24, 1932 Referring Provider (PT): Lynne Leader MD   Encounter Date: 01/11/2021   PT End of Session - 01/11/21 1015    Visit Number 2    Authorization Type Cohere    PT Start Time 3295    PT Stop Time 1100    PT Time Calculation (min) 45 min    Activity Tolerance Patient tolerated treatment well;No increased pain    Behavior During Therapy WFL for tasks assessed/performed           Past Medical History:  Diagnosis Date  . Actinic keratosis 03/13/2009  . Arthritis   . BENIGN PROSTATIC HYPERTROPHY, HX OF 04/07/2009  . CARCINOMA, SKIN, SQUAMOUS CELL 09/11/2009  . COLONIC POLYPS, HX OF 03/13/2009  . Essential hypertension   . GERD 03/13/2009  . Hyperlipidemia   . INSOMNIA, CHRONIC 09/11/2009  . Persistent atrial fibrillation ([place]) 04/07/2009   a. s/p afib ablation at [facility] x 2 in 2010.  Marland Kitchen POLYPECTOMY, HX OF 04/07/2009  . Premature atrial contractions   . PULMONARY NODULE 09/11/2009  . PVC's (premature ventricular contractions)   . Rosacea 03/13/2009  . SKIN CANCER, HX OF 03/13/2009    Past Surgical History:  Procedure Laterality Date  . ABLATION  2010   x2  . COLONOSCOPY W/ BIOPSIES AND POLYPECTOMY    . EYE SURGERY Bilateral    cataracts  . LUMBAR DISC SURGERY     RUPTURE  . LUMBAR LAMINECTOMY/DECOMPRESSION MICRODISCECTOMY N/A 04/20/2014   Procedure: LUMBAR TWO-THREE, LUMBAR THREE-FOUR, LUMAR FOUR-FIVE LAMINECTOMIES;  Surgeon: Newman Pies, MD;  Location: [place];  Service: Neurosurgery;  Laterality: N/A;  . POLYPECTOMY    . SKIN CANCER EXCISION      There were no vitals filed for this visit.   Subjective Assessment - 01/11/21 1019    Subjective Sore this AM    Currently in Pain? Yes    Pain Score 7      Pain Location Shoulder    Pain Orientation Right    Pain Descriptors / Indicators Sore    Multiple Pain Sites No              [facility] PT Assessment - 01/11/21 0001      Observation/Other Assessments   Focus on Therapeutic Outcomes (FOTO)  50                         [facility] Adult PT Treatment/Exercise - 01/11/21 0001      Shoulder Exercises: Supine   Other Supine Exercises Finger clasped 10x      Shoulder Exercises: Seated   Other Seated Exercises Scap squeezes 10x   Shoulder rolls 20x     Shoulder Exercises: Standing   Other Standing Exercises Red rows & ext 10x each      Shoulder Exercises: Pulleys   Flexion 2 minutes      Iontophoresis   Type of Iontophoresis Dexamethasone    Location Rt upper arm point of  greatest tenderness    Dose 1 ml    Time 4-6 hr wear; skin intact, educated pt in wear time and skin care      Manual Therapy   Soft tissue mobilization Rt infraspinatus/teres and into upper arm  PT Short Term Goals - 01/09/21 1128      PT SHORT TERM GOAL #1   Title pt to demonstrate improved Rt shoulder flexion A/ROM > or = 100    Period Weeks    Status New    Target Date 02/06/21      PT SHORT TERM GOAL #2   Title pt to report  improved pain levels less than 5/10 during 50% of activity    Time 4    Period Weeks    Status New    Target Date 02/06/21      PT SHORT TERM GOAL #3   Title pt to be I with  initial HEP    Time 4    Period Weeks    Status New    Target Date 02/06/21             PT Long Term Goals - 01/09/21 1102      PT LONG TERM GOAL #1   Title pt to demonstrate R shoulder flexion A/ROM > or = 105 to improve overhead reaching    Time 8    Period Weeks    Status New    Target Date 03/06/21      PT LONG TERM GOAL #2   Title pt to report improved pain to less than 3/10 with 75% of activity    Time 8    Period Weeks    Status New    Target Date 03/06/21      PT LONG TERM GOAL #3    Title pt to be I with advanced HEP    Time 8    Period Weeks    Status New    Target Date 03/06/21      PT LONG TERM GOAL #4   Title pt to demonstrate improved Rt shoulder global strength to at least 4/5 for improved function with transfers   Simultaneous filing. User may not have seen previous data.   Time 8   Simultaneous filing. User may not have seen previous data.   Period Weeks   Simultaneous filing. User may not have seen previous data.   Status New    Target Date 03/06/21   Simultaneous filing. User may not have seen previous data.                Plan - 01/11/21 1019    Clinical Impression Statement Pt arrives today with 7/10 "sore" RT shoulder. Pt independent and compliant in his initial HET. FOTO was completed today, see chart for score. Pt's RT infraspinatus/teres and insertion were tender and thick. PTA began ionto today to just distal to the Rt greater tube where pt was most tender. Pain about the same at end of session.    Personal Factors and Comorbidities Age;Fitness;Past/Current Experience    Examination-Activity Limitations Reach Overhead    Examination-Participation Restrictions Cleaning;Community Activity    Stability/Clinical Decision Making Stable/Uncomplicated    Rehab Potential Good    PT Frequency 2x / week    PT Duration 8 weeks    PT Treatment/Interventions ADLs/Self Care Home Management;DME Instruction;Gait training;Stair training;Functional mobility training;Neuromuscular re-education;Therapeutic exercise;Therapeutic activities;Manual techniques;Passive range of motion;Energy conservation;Moist Heat;Electrical Stimulation;Cryotherapy;Dry needling;Iontophoresis 4mg /ml Dexamethasone    PT Next Visit Plan See how ionto went, do #2, P/AA/AROM to RT shoulder    PT Home Exercise Plan Access Code: [email or web address]    Consulted and Agree with Plan of Care Patient           Patient will benefit from  skilled therapeutic intervention in order to improve the following  deficits and impairments:  Decreased activity tolerance,Pain,Impaired flexibility,Decreased strength,Decreased range of motion,Decreased mobility  Visit Diagnosis: Muscle weakness (generalized)  Abnormal posture  Cramp and spasm  Acute pain of right shoulder     Problem List Patient Active Problem List   Diagnosis Date Noted  . Ventricular bigeminy 04/08/2019  . Unstable angina ([place]) 04/07/2019  . Right sciatic nerve pain 07/18/2015  . Recurrent vertigo 07/18/2015  . BPH (benign prostatic hyperplasia) 06/22/2015  . CKD (chronic kidney disease) stage 3, GFR 30-59 ml/min ([facility]) 12/22/2014  . Lumbar stenosis with neurogenic claudication 04/20/2014  . Spinal stenosis of lumbar region 12/15/2013  . Need for shingles vaccine 08/31/2012  . Prediabetes 10/16/2011  . CARCINOMA, SKIN, SQUAMOUS CELL 09/11/2009  . INSOMNIA, CHRONIC 09/11/2009  . PULMONARY NODULE 09/11/2009  . ATRIAL FIBRILLATION 04/07/2009  . BENIGN PROSTATIC HYPERTROPHY, HX OF 04/07/2009  . POLYPECTOMY, HX OF 04/07/2009  . Hyperlipidemia 03/13/2009  . Essential hypertension 03/13/2009  . GERD 03/13/2009  . ROSACEA 03/13/2009  . ACTINIC KERATOSIS 03/13/2009  . SKIN CANCER, HX OF 03/13/2009  . COLONIC POLYPS, HX OF 03/13/2009    [name], PTA 01/11/2021, 11:02 AM  [facility] [address], [place], [place], [address] Phone: [phone]   Fax:  [phone]  Name: [name] MRN: [id number] Date of Birth: 1932/05/28

## 2021-01-16 ENCOUNTER — Ambulatory Visit: Payer: Medicare PPO

## 2021-01-16 ENCOUNTER — Other Ambulatory Visit: Payer: Self-pay

## 2021-01-16 DIAGNOSIS — M25511 Pain in right shoulder: Secondary | ICD-10-CM | POA: Diagnosis not present

## 2021-01-16 DIAGNOSIS — M6281 Muscle weakness (generalized): Secondary | ICD-10-CM | POA: Diagnosis not present

## 2021-01-16 DIAGNOSIS — R252 Cramp and spasm: Secondary | ICD-10-CM | POA: Diagnosis not present

## 2021-01-16 DIAGNOSIS — R293 Abnormal posture: Secondary | ICD-10-CM | POA: Diagnosis not present

## 2021-01-16 NOTE — Therapy (Addendum)
[facility] [address] [place], [place], [address] Phone: [phone]   Fax:  [phone]  Physical Therapy Treatment  Patient Details  Name: [name] MRN: [id number] Date of Birth: 13-Mar-1932 Referring Provider (PT): Lynne Leader MD   Encounter Date: 01/16/2021   PT End of Session - 01/16/21 1230    Visit Number 3    Authorization Type [employer] -12 visits 5/18-7/15/22    Authorization - Visit Number 3    Authorization - Number of Visits 12    Progress Note Due on Visit 10    PT Start Time 6967    PT Stop Time 1229    PT Time Calculation (min) 44 min    Activity Tolerance Patient tolerated treatment well;No increased pain    Behavior During Therapy WFL for tasks assessed/performed           Past Medical History:  Diagnosis Date  . Actinic keratosis 03/13/2009  . Arthritis   . BENIGN PROSTATIC HYPERTROPHY, HX OF 04/07/2009  . CARCINOMA, SKIN, SQUAMOUS CELL 09/11/2009  . COLONIC POLYPS, HX OF 03/13/2009  . Essential hypertension   . GERD 03/13/2009  . Hyperlipidemia   . INSOMNIA, CHRONIC 09/11/2009  . Persistent atrial fibrillation ([place]) 04/07/2009   a. s/p afib ablation at [facility] x 2 in 2010.  Marland Kitchen POLYPECTOMY, HX OF 04/07/2009  . Premature atrial contractions   . PULMONARY NODULE 09/11/2009  . PVC's (premature ventricular contractions)   . Rosacea 03/13/2009  . SKIN CANCER, HX OF 03/13/2009    Past Surgical History:  Procedure Laterality Date  . ABLATION  2010   x2  . COLONOSCOPY W/ BIOPSIES AND POLYPECTOMY    . EYE SURGERY Bilateral    cataracts  . LUMBAR DISC SURGERY     RUPTURE  . LUMBAR LAMINECTOMY/DECOMPRESSION MICRODISCECTOMY N/A 04/20/2014   Procedure: LUMBAR TWO-THREE, LUMBAR THREE-FOUR, LUMAR FOUR-FIVE LAMINECTOMIES;  Surgeon: Newman Pies, MD;  Location: [place] [facility];  Service: Neurosurgery;  Laterality: N/A;  . POLYPECTOMY    . SKIN CANCER EXCISION      There were no vitals filed for  this visit.   Subjective Assessment - 01/16/21 1149    Subjective sore in R shoulder, reports continuing to do exercises    Patient Stated Goals to reduce shoulder pain and be more mobile    Currently in Pain? Yes    Pain Score 4     Pain Location Shoulder    Pain Orientation Right    Pain Descriptors / Indicators Sore    Pain Type Acute pain    Pain Radiating Towards no longer radiating    Pain Frequency Intermittent    Multiple Pain Sites No                             [facility] Adult PT Treatment/Exercise - 01/16/21 0001      Ambulation/Gait   Ambulation/Gait Yes    Assistive device Small based quad cane      Exercises   Exercises Shoulder;Neck      Neck Exercises: Machines for Strengthening   UBE (Upper Arm Bike) 2 mins forward 2 min backward; L1      Neck Exercises: Theraband   Scapula Retraction 20 reps;Red    Rows 20 reps    Shoulder External Rotation 20 reps;Red    Shoulder Internal Rotation Red;20 reps   Rt     Neck Exercises: Seated   Shoulder  Flexion Both;Other (comment)   2     Shoulder Exercises: Pulleys   Flexion 2 minutes    Scaption 2 minutes   tactile cueing for scapular depression     Manual Therapy   Manual Therapy Passive ROM;Joint mobilization    Manual therapy comments PROM at Rt shoulder with humeral head depression with shoulder abduction  and flexion and oscillations for improved range and tolerance.                  PT Education - 01/16/21 1228    Education Details Access Code: [email or web address]. PT went over HEP with pt as he had questions regarding posture activity, pt reported understanding. Pt also educated to continue HEP and posture throughout day to decrease compensatory patterns.    Person(s) Educated Patient    Methods Explanation;Demonstration    Comprehension Verbalized understanding;Returned demonstration            PT Short Term Goals - 01/09/21 1128      PT SHORT TERM GOAL #1   Title pt to demonstrate  improved Rt shoulder flexion A/ROM > or = 100    Period Weeks    Status New    Target Date 02/06/21      PT SHORT TERM GOAL #2   Title pt to report  improved pain levels less than 5/10 during 50% of activity    Time 4    Period Weeks    Status New    Target Date 02/06/21      PT SHORT TERM GOAL #3   Title pt to be I with  initial HEP    Time 4    Period Weeks    Status New    Target Date 02/06/21             PT Long Term Goals - 01/09/21 1102      PT LONG TERM GOAL #1   Title pt to demonstrate R shoulder flexion A/ROM > or = 105 to improve overhead reaching    Time 8    Period Weeks    Status New    Target Date 03/06/21      PT LONG TERM GOAL #2   Title pt to report improved pain to less than 3/10 with 75% of activity    Time 8    Period Weeks    Status New    Target Date 03/06/21      PT LONG TERM GOAL #3   Title pt to be I with advanced HEP    Time 8    Period Weeks    Status New    Target Date 03/06/21      PT LONG TERM GOAL #4   Title pt to demonstrate improved Rt shoulder global strength to at least 4/5 for improved function with transfers   Simultaneous filing. User may not have seen previous data.   Time 8   Simultaneous filing. User may not have seen previous data.   Period Weeks   Simultaneous filing. User may not have seen previous data.   Status New    Target Date 03/06/21   Simultaneous filing. User may not have seen previous data.                Plan - 01/16/21 1230    Clinical Impression Statement Pt presents today for PT session reporting 4/10 sorness in Rt shoulder and reports he feels his HEP is helping and continues to do this at home. Pt  demonstrated good ability to complete all exercises with limitation for activity being pain at lateral delt but able to do all activities. pt continues to require intermittent compensatory strategies for activities secondary to pain and reports he feels he would continue to benefit from PT. Pt denied  increased pain at end of session and continued to be 4/10. Pt would benefit from continued PT to promote improved pain levels with functional mobility and improved QOL.    Examination-Activity Limitations Reach Overhead    Examination-Participation Restrictions Cleaning;Community Activity    Stability/Clinical Decision Making Stable/Uncomplicated    Rehab Potential Good    PT Frequency 2x / week    PT Duration 8 weeks    PT Treatment/Interventions ADLs/Self Care Home Management;DME Instruction;Gait training;Stair training;Functional mobility training;Neuromuscular re-education;Therapeutic exercise;Therapeutic activities;Manual techniques;Passive range of motion;Energy conservation;Moist Heat;Electrical Stimulation;Cryotherapy;Dry needling;Iontophoresis 4mg /ml Dexamethasone    PT Next Visit Plan stretching and strengthening, manual to Rt shoulder    PT Home Exercise Plan Access Code: [email or web address]    Consulted and Agree with Plan of Care Patient           Patient will benefit from skilled therapeutic intervention in order to improve the following deficits and impairments:  Decreased activity tolerance,Pain,Impaired flexibility,Decreased strength,Decreased range of motion,Decreased mobility  Visit Diagnosis: Acute pain of right shoulder  Abnormal posture  Muscle weakness (generalized)     Problem List Patient Active Problem List   Diagnosis Date Noted  . Ventricular bigeminy 04/08/2019  . Unstable angina ([place]) 04/07/2019  . Right sciatic nerve pain 07/18/2015  . Recurrent vertigo 07/18/2015  . BPH (benign prostatic hyperplasia) 06/22/2015  . CKD (chronic kidney disease) stage 3, GFR 30-59 ml/min (HCC) 12/22/2014  . Lumbar stenosis with neurogenic claudication 04/20/2014  . Spinal stenosis of lumbar region 12/15/2013  . Need for shingles vaccine 08/31/2012  . Prediabetes 10/16/2011  . CARCINOMA, SKIN, SQUAMOUS CELL 09/11/2009  . INSOMNIA, CHRONIC 09/11/2009  . PULMONARY NODULE  09/11/2009  . ATRIAL FIBRILLATION 04/07/2009  . BENIGN PROSTATIC HYPERTROPHY, HX OF 04/07/2009  . POLYPECTOMY, HX OF 04/07/2009  . Hyperlipidemia 03/13/2009  . Essential hypertension 03/13/2009  . GERD 03/13/2009  . ROSACEA 03/13/2009  . ACTINIC KERATOSIS 03/13/2009  . SKIN CANCER, HX OF 03/13/2009  . COLONIC POLYPS, HX OF 03/13/2009    Stacy Gardner, PT 05/25/221:10 PM   [facility] [address], [place], [place], [address] Phone: [phone]   Fax:  [phone]  Name: [name] MRN: [id number] Date of Birth: [birth year]

## 2021-01-17 DIAGNOSIS — D0472 Carcinoma in situ of skin of left lower limb, including hip: Secondary | ICD-10-CM | POA: Diagnosis not present

## 2021-01-17 DIAGNOSIS — D0471 Carcinoma in situ of skin of right lower limb, including hip: Secondary | ICD-10-CM | POA: Diagnosis not present

## 2021-01-18 ENCOUNTER — Encounter: Payer: Medicare PPO | Admitting: Physical Therapy

## 2021-01-23 ENCOUNTER — Ambulatory Visit: Payer: Medicare PPO | Attending: Family Medicine

## 2021-01-23 ENCOUNTER — Other Ambulatory Visit: Payer: Self-pay

## 2021-01-23 DIAGNOSIS — M6281 Muscle weakness (generalized): Secondary | ICD-10-CM | POA: Insufficient documentation

## 2021-01-23 DIAGNOSIS — R252 Cramp and spasm: Secondary | ICD-10-CM | POA: Diagnosis not present

## 2021-01-23 DIAGNOSIS — R293 Abnormal posture: Secondary | ICD-10-CM | POA: Insufficient documentation

## 2021-01-23 DIAGNOSIS — M25511 Pain in right shoulder: Secondary | ICD-10-CM | POA: Insufficient documentation

## 2021-01-23 NOTE — Therapy (Signed)
[facility] [address] [place], [place], [address] Phone: [phone]   Fax:  [phone]  Physical Therapy Treatment  Patient Details  Name: [name] MRN: [id number] Date of Birth: 1932/07/25 Referring Provider (PT): Lynne Leader MD   Encounter Date: 01/23/2021   PT End of Session - 01/23/21 1101    Visit Number 4    Authorization Type [employer] -12 visits 5/18-7/15/22    Authorization - Visit Number 4    Authorization - Number of Visits 12    Progress Note Due on Visit 10    PT Start Time 1025    PT Stop Time 8527    PT Time Calculation (min) 37 min    Activity Tolerance Patient tolerated treatment well;No increased pain    Behavior During Therapy WFL for tasks assessed/performed           Past Medical History:  Diagnosis Date  . Actinic keratosis 03/13/2009  . Arthritis   . BENIGN PROSTATIC HYPERTROPHY, HX OF 04/07/2009  . CARCINOMA, SKIN, SQUAMOUS CELL 09/11/2009  . COLONIC POLYPS, HX OF 03/13/2009  . Essential hypertension   . GERD 03/13/2009  . Hyperlipidemia   . INSOMNIA, CHRONIC 09/11/2009  . Persistent atrial fibrillation ([place]) 04/07/2009   a. s/p afib ablation at [facility] x 2 in 2010.  Marland Kitchen POLYPECTOMY, HX OF 04/07/2009  . Premature atrial contractions   . PULMONARY NODULE 09/11/2009  . PVC's (premature ventricular contractions)   . Rosacea 03/13/2009  . SKIN CANCER, HX OF 03/13/2009    Past Surgical History:  Procedure Laterality Date  . ABLATION  2010   x2  . COLONOSCOPY W/ BIOPSIES AND POLYPECTOMY    . EYE SURGERY Bilateral    cataracts  . LUMBAR DISC SURGERY     RUPTURE  . LUMBAR LAMINECTOMY/DECOMPRESSION MICRODISCECTOMY N/A 04/20/2014   Procedure: LUMBAR TWO-THREE, LUMBAR THREE-FOUR, LUMAR FOUR-FIVE LAMINECTOMIES;  Surgeon: Newman Pies, MD;  Location: [place] [facility];  Service: Neurosurgery;  Laterality: N/A;  . POLYPECTOMY    . SKIN CANCER EXCISION      There were no vitals filed for  this visit.   Subjective Assessment - 01/23/21 1018    Subjective Pt reports continued pain in Rt shoulder, aching intermittently, rest improves pain. Pt reports continued completion of HEP and rpeorts this helps.    Patient Stated Goals to reduce shoulder pain and be more mobile    Currently in Pain? Yes    Pain Score 4    worse a 8/10 intermittently, no noted activity to make it worse.   Pain Location Shoulder    Pain Orientation Right    Pain Descriptors / Indicators Sore    Pain Onset More than a month ago    Pain Frequency Intermittent    Aggravating Factors  pushing up from chair, "certain movements"    Pain Relieving Factors rest    Multiple Pain Sites No                             [facility] Adult PT Treatment/Exercise - 01/23/21 0001      Exercises   Exercises Shoulder      Neck Exercises: Machines for Strengthening   UBE (Upper Arm Bike) 2 mins forward 2 min backward; L1      Neck Exercises: Theraband   Scapula Retraction 20 reps;Blue    Rows 20 reps      Shoulder Exercises: Seated  Other Seated Exercises shoulder rolls forward and backward, x15      Shoulder Exercises: Standing   Theraband Level (Shoulder Extension) Level 4 (Blue)   2x10     Shoulder Exercises: Pulleys   Flexion 2 minutes      Iontophoresis   Type of Iontophoresis Dexamethasone    Location Rt upper arm point of  greatest tenderness    Dose 1 ml    Time 4-6 hr wear; skin intact, educated pt in wear time and skin care   pt denies any adverse effects since last use                 PT Education - 01/23/21 1057    Education Details PT educated pt on importance of improved posture, educated on use of increased resistance in theraband given Blue for HEP and to return to Red if there is increase in pain or inability to complete activity. Pt also educated on potentially using cane in Lt hand to decrease reliance on RUE with cane use. pt seemed hesitent to does this but agreed  to try, declined to attempt during session.    Person(s) Educated Patient    Methods Explanation;Demonstration    Comprehension Verbalized understanding;Returned demonstration            PT Short Term Goals - 01/09/21 1128      PT SHORT TERM GOAL #1   Title pt to demonstrate improved Rt shoulder flexion A/ROM > or = 100    Period Weeks    Status New    Target Date 02/06/21      PT SHORT TERM GOAL #2   Title pt to report  improved pain levels less than 5/10 during 50% of activity    Time 4    Period Weeks    Status New    Target Date 02/06/21      PT SHORT TERM GOAL #3   Title pt to be I with  initial HEP    Time 4    Period Weeks    Status New    Target Date 02/06/21             PT Long Term Goals - 01/09/21 1102      PT LONG TERM GOAL #1   Title pt to demonstrate R shoulder flexion A/ROM > or = 105 to improve overhead reaching    Time 8    Period Weeks    Status New    Target Date 03/06/21      PT LONG TERM GOAL #2   Title pt to report improved pain to less than 3/10 with 75% of activity    Time 8    Period Weeks    Status New    Target Date 03/06/21      PT LONG TERM GOAL #3   Title pt to be I with advanced HEP    Time 8    Period Weeks    Status New    Target Date 03/06/21      PT LONG TERM GOAL #4   Title pt to demonstrate improved Rt shoulder global strength to at least 4/5 for improved function with transfers   Simultaneous filing. User may not have seen previous data.   Time 8   Simultaneous filing. User may not have seen previous data.   Period Weeks   Simultaneous filing. User may not have seen previous data.   Status New    Target Date 03/06/21  Simultaneous filing. User may not have seen previous data.                Plan - 01/23/21 1101    Clinical Impression Statement Pt presents for treatment this date reporting 4/10 pain in Rt shoulder at lateral aspect of deltoid and reports he feels sore at home with rest improving this  however pt reported intermittently without noted activity changes pain will increase to 8/10 and with change of position and rest this will improve. Throughout session, pt demonstrated good ability to complete exercises, increased resistnace band to [facility] with pt declining increased pain. Pt benefited from tactile cues intermittently for improved technique with exercises and to limit compensatory patterns. Pt reported slight decrease in pain 3/10 at end of session. Pt would benefit from continued PT to promote improved pain levels with functional mobility and to improve QOL.    Personal Factors and Comorbidities Age;Fitness;Past/Current Experience    Examination-Activity Limitations Reach Overhead    Examination-Participation Restrictions Cleaning;Community Activity    Stability/Clinical Decision Making Stable/Uncomplicated    Rehab Potential Good    PT Frequency 2x / week    PT Duration 8 weeks    PT Treatment/Interventions ADLs/Self Care Home Management;DME Instruction;Gait training;Stair training;Functional mobility training;Neuromuscular re-education;Therapeutic exercise;Therapeutic activities;Manual techniques;Passive range of motion;Energy conservation;Moist Heat;Electrical Stimulation;Cryotherapy;Dry needling;Iontophoresis 4mg /ml Dexamethasone    PT Next Visit Plan stretching and strengthening, manual to Rt shoulder, ionto    PT Home Exercise Plan Access Code: [email or web address]    Consulted and Agree with Plan of Care Patient           Patient will benefit from skilled therapeutic intervention in order to improve the following deficits and impairments:  Decreased activity tolerance,Pain,Impaired flexibility,Decreased strength,Decreased range of motion,Decreased mobility  Visit Diagnosis: Acute pain of right shoulder  Abnormal posture  Muscle weakness (generalized)     Problem List Patient Active Problem List   Diagnosis Date Noted  . Ventricular bigeminy 04/08/2019  . Unstable angina  ([place]) 04/07/2019  . Right sciatic nerve pain 07/18/2015  . Recurrent vertigo 07/18/2015  . BPH (benign prostatic hyperplasia) 06/22/2015  . CKD (chronic kidney disease) stage 3, GFR 30-59 ml/min (HCC) 12/22/2014  . Lumbar stenosis with neurogenic claudication 04/20/2014  . Spinal stenosis of lumbar region 12/15/2013  . Need for shingles vaccine 08/31/2012  . Prediabetes 10/16/2011  . CARCINOMA, SKIN, SQUAMOUS CELL 09/11/2009  . INSOMNIA, CHRONIC 09/11/2009  . PULMONARY NODULE 09/11/2009  . ATRIAL FIBRILLATION 04/07/2009  . BENIGN PROSTATIC HYPERTROPHY, HX OF 04/07/2009  . POLYPECTOMY, HX OF 04/07/2009  . Hyperlipidemia 03/13/2009  . Essential hypertension 03/13/2009  . GERD 03/13/2009  . ROSACEA 03/13/2009  . ACTINIC KERATOSIS 03/13/2009  . SKIN CANCER, HX OF 03/13/2009  . COLONIC POLYPS, HX OF 03/13/2009   Stacy Gardner, PT 01/23/2210:10 AM    [facility] [address], [place], [place], [address] Phone: [phone]   Fax:  [phone]  Name: DOYT CASTELLANA MRN: [id number] Date of Birth: 21-Oct-1931

## 2021-01-25 ENCOUNTER — Encounter: Payer: Self-pay | Admitting: Physical Therapy

## 2021-01-25 ENCOUNTER — Ambulatory Visit: Payer: Medicare PPO | Admitting: Physical Therapy

## 2021-01-25 ENCOUNTER — Other Ambulatory Visit: Payer: Self-pay

## 2021-01-25 DIAGNOSIS — R252 Cramp and spasm: Secondary | ICD-10-CM | POA: Diagnosis not present

## 2021-01-25 DIAGNOSIS — R293 Abnormal posture: Secondary | ICD-10-CM | POA: Diagnosis not present

## 2021-01-25 DIAGNOSIS — M6281 Muscle weakness (generalized): Secondary | ICD-10-CM

## 2021-01-25 DIAGNOSIS — M25511 Pain in right shoulder: Secondary | ICD-10-CM

## 2021-01-25 NOTE — Therapy (Signed)
[facility] [address] [place], [place], [address] Phone: [phone]   Fax:  [phone]  Physical Therapy Treatment  Patient Details  Name: [name] MRN: [id number] Date of Birth: [birth year] Referring Provider (PT): Lynne Leader MD   Encounter Date: 01/25/2021   PT End of Session - 01/25/21 1100    Visit Number 5    Authorization Type Cohere -12 visits 5/18-7/15/22    Authorization - Visit Number 5    Authorization - Number of Visits 12    Progress Note Due on Visit 10    PT Start Time 1101    PT Stop Time 1146    PT Time Calculation (min) 45 min    Activity Tolerance Patient tolerated treatment well;No increased pain    Behavior During Therapy WFL for tasks assessed/performed           Past Medical History:  Diagnosis Date  . Actinic keratosis 03/13/2009  . Arthritis   . BENIGN PROSTATIC HYPERTROPHY, HX OF 04/07/2009  . CARCINOMA, SKIN, SQUAMOUS CELL 09/11/2009  . COLONIC POLYPS, HX OF 03/13/2009  . Essential hypertension   . GERD 03/13/2009  . Hyperlipidemia   . INSOMNIA, CHRONIC 09/11/2009  . Persistent atrial fibrillation ([place]) 04/07/2009   a. s/p afib ablation at [facility] x 2 in 2010.  Marland Kitchen POLYPECTOMY, HX OF 04/07/2009  . Premature atrial contractions   . PULMONARY NODULE 09/11/2009  . PVC's (premature ventricular contractions)   . Rosacea 03/13/2009  . SKIN CANCER, HX OF 03/13/2009    Past Surgical History:  Procedure Laterality Date  . ABLATION  2010   x2  . COLONOSCOPY W/ BIOPSIES AND POLYPECTOMY    . EYE SURGERY Bilateral    cataracts  . LUMBAR DISC SURGERY     RUPTURE  . LUMBAR LAMINECTOMY/DECOMPRESSION MICRODISCECTOMY N/A 04/20/2014   Procedure: LUMBAR TWO-THREE, LUMBAR THREE-FOUR, LUMAR FOUR-FIVE LAMINECTOMIES;  Surgeon: Newman Pies, MD;  Location: [place] [facility];  Service: Neurosurgery;  Laterality: N/A;  . POLYPECTOMY    . SKIN CANCER EXCISION      There were no vitals filed for  this visit.   Subjective Assessment - 01/25/21 1104    Subjective Pt states still a little pain, not bad.  Pt states it is a little better.    Currently in Pain? Yes    Pain Location Shoulder    Pain Orientation Right    Pain Descriptors / Indicators Sore    Pain Type Acute pain    Pain Onset More than a month ago    Pain Frequency Intermittent    Pain Relieving Factors rest    Multiple Pain Sites No                             [facility] Adult PT Treatment/Exercise - 01/25/21 0001      Exercises   Exercises Shoulder      Neck Exercises: Machines for Strengthening   UBE (Upper Arm Bike) 2 mins forward 2 min backward; L1      Neck Exercises: Theraband   Scapula Retraction 20 reps;Blue    Shoulder Extension 20 reps;Blue    Rows 20 reps      Shoulder Exercises: Seated   Other Seated Exercises shoulder flexion with pball roll    Other Seated Exercises thoracic extension with ball behind back in chair      Shoulder Exercises: Standing   Theraband Level (Shoulder Extension) --  2x10     Shoulder Exercises: Pulleys   Flexion 2 minutes      Shoulder Exercises: Isometric Strengthening   ABduction 5X10"      Iontophoresis   Type of Iontophoresis Dexamethasone    Location Rt upper arm point of  greatest tenderness    Dose 1 ml    Time 4-6 hr wear; skin intact, educated pt in wear time and skin care   pt denies any adverse effects since last use     Manual Therapy   Soft tissue mobilization Rt pec major/minor, anterior deltoid                  PT Education - 01/25/21 1201    Education Details Access Code: [email or web address]    Person(s) Educated Patient    Methods Explanation;Demonstration;Verbal cues;Handout;Tactile cues    Comprehension Verbalized understanding;Returned demonstration            PT Short Term Goals - 01/25/21 1120      PT SHORT TERM GOAL #1   Title pt to demonstrate improved Rt shoulder flexion A/ROM > or = 100    Baseline 110 no  pain    Status Achieved      PT SHORT TERM GOAL #2   Title pt to report  improved pain levels less than 5/10 during 50% of activity    Baseline most of the time less than 5/10    Status Achieved      PT SHORT TERM GOAL #3   Title pt to be I with  initial HEP    Status Achieved             PT Long Term Goals - 01/09/21 1102      PT LONG TERM GOAL #1   Title pt to demonstrate R shoulder flexion A/ROM > or = 105 to improve overhead reaching    Time 8    Period Weeks    Status New    Target Date 03/06/21      PT LONG TERM GOAL #2   Title pt to report improved pain to less than 3/10 with 75% of activity    Time 8    Period Weeks    Status New    Target Date 03/06/21      PT LONG TERM GOAL #3   Title pt to be I with advanced HEP    Time 8    Period Weeks    Status New    Target Date 03/06/21      PT LONG TERM GOAL #4   Title pt to demonstrate improved Rt shoulder global strength to at least 4/5 for improved function with transfers   Simultaneous filing. User may not have seen previous data.   Time 8   Simultaneous filing. User may not have seen previous data.   Period Weeks   Simultaneous filing. User may not have seen previous data.   Status New    Target Date 03/06/21   Simultaneous filing. User may not have seen previous data.                Plan - 01/25/21 1152    Clinical Impression Statement Pt did well with exercises today, but some soreness when doing shoulder ER with bands.  No increased pain after treatment.  Pt needs cues to keep shoulders down and back.  He was TTP at pec minor attachment at coracoid process.  pt states he is feeling a little better and he  met all initial STGs at this time.  Pt will benefit from skilled PT to continue working towards his funcitonal goals.    PT Treatment/Interventions ADLs/Self Care Home Management;DME Instruction;Gait training;Stair training;Functional mobility training;Neuromuscular re-education;Therapeutic  exercise;Therapeutic activities;Manual techniques;Passive range of motion;Energy conservation;Moist Heat;Electrical Stimulation;Cryotherapy;Dry needling;Iontophoresis 69m/ml Dexamethasone    PT Next Visit Plan stretching and strengthening, manual to Rt shoulder (pecs), ionto    PT Home Exercise Plan Access Code: [email or web address]   Consulted and Agree with Plan of Care Patient           Patient will benefit from skilled therapeutic intervention in order to improve the following deficits and impairments:  Decreased activity tolerance,Pain,Impaired flexibility,Decreased strength,Decreased range of motion,Decreased mobility  Visit Diagnosis: Acute pain of right shoulder  Abnormal posture  Muscle weakness (generalized)  Cramp and spasm     Problem List Patient Active Problem List   Diagnosis Date Noted  . Ventricular bigeminy 04/08/2019  . Unstable angina ([place] 04/07/2019  . Right sciatic nerve pain 07/18/2015  . Recurrent vertigo 07/18/2015  . BPH (benign prostatic hyperplasia) 06/22/2015  . CKD (chronic kidney disease) stage 3, GFR 30-59 ml/min (HCC) 12/22/2014  . Lumbar stenosis with neurogenic claudication 04/20/2014  . Spinal stenosis of lumbar region 12/15/2013  . Need for shingles vaccine 08/31/2012  . Prediabetes 10/16/2011  . CARCINOMA, SKIN, SQUAMOUS CELL 09/11/2009  . INSOMNIA, CHRONIC 09/11/2009  . PULMONARY NODULE 09/11/2009  . ATRIAL FIBRILLATION 04/07/2009  . BENIGN PROSTATIC HYPERTROPHY, HX OF 04/07/2009  . POLYPECTOMY, HX OF 04/07/2009  . Hyperlipidemia 03/13/2009  . Essential hypertension 03/13/2009  . GERD 03/13/2009  . ROSACEA 03/13/2009  . ACTINIC KERATOSIS 03/13/2009  . SKIN CANCER, HX OF 03/13/2009  . COLONIC POLYPS, HX OF 03/13/2009    JJule Ser PT 01/25/2021, 12:02 PM  [facility] [address] [place] [address]: [phone]  Fax:  [phone] Name: Steven BUDLONGMRN: [id number] of Birth: 6July 10, 1933

## 2021-01-25 NOTE — Patient Instructions (Signed)
Access Code: [email or web address] URL: [email or web address] Date: 01/25/2021 Prepared by: Jari Favre  Exercises Scapular Retraction with Resistance - 2 x daily - 7 x weekly - 2 sets - 10 reps Seated Scapular Retraction - 5 x daily - 7 x weekly - 1 sets - 10 reps - 5 hold Shoulder extension with resistance - Neutral - 2 x daily - 7 x weekly - 2 sets - 10 reps Seated Correct Posture - 1 x daily - 7 x weekly - 3 sets - 10 reps Supine Shoulder Flexion AAROM with Hands Clasped - 2 x daily - 7 x weekly - 1 sets - 10 reps - 10 hold Shoulder Flexion Wall Slide with Towel - 1 x daily - 7 x weekly - 1 sets - 10 reps - 5 hold Standing Isometric Shoulder Abduction with Doorway - Arm Bent - 1 x daily - 7 x weekly - 3 sets - 10 reps - 3 sec hold

## 2021-01-29 ENCOUNTER — Other Ambulatory Visit: Payer: Self-pay

## 2021-01-29 ENCOUNTER — Ambulatory Visit: Payer: Medicare PPO | Admitting: Physical Therapy

## 2021-01-29 ENCOUNTER — Encounter: Payer: Self-pay | Admitting: Physical Therapy

## 2021-01-29 DIAGNOSIS — R293 Abnormal posture: Secondary | ICD-10-CM | POA: Diagnosis not present

## 2021-01-29 DIAGNOSIS — M6281 Muscle weakness (generalized): Secondary | ICD-10-CM | POA: Diagnosis not present

## 2021-01-29 DIAGNOSIS — M25511 Pain in right shoulder: Secondary | ICD-10-CM | POA: Diagnosis not present

## 2021-01-29 DIAGNOSIS — R252 Cramp and spasm: Secondary | ICD-10-CM

## 2021-01-29 NOTE — Therapy (Signed)
[facility] [address] [place], [place], [address] Phone: [phone]   Fax:  [phone]  Physical Therapy Treatment  Patient Details  Name: [name] MRN: [id number] Date of Birth: 01-29-1932 Referring Provider (PT): Lynne Leader MD   Encounter Date: 01/29/2021   PT End of Session - 01/29/21 1455    Visit Number 6    Date for PT Re-Evaluation 03/06/21    Authorization Type [employer]here -12 visits 5/18-7/15/22    Authorization - Visit Number 6    Authorization - Number of Visits 12    Progress Note Due on Visit 10    PT Start Time 7564    PT Stop Time 3329   only 2 units billed due to estim 20 min   PT Time Calculation (min) 42 min    Activity Tolerance Patient tolerated treatment well    Behavior During Therapy [facility] for tasks assessed/performed           Past Medical History:  Diagnosis Date  . Actinic keratosis 03/13/2009  . Arthritis   . BENIGN PROSTATIC HYPERTROPHY, HX OF 04/07/2009  . CARCINOMA, SKIN, SQUAMOUS CELL 09/11/2009  . COLONIC POLYPS, HX OF 03/13/2009  . Essential hypertension   . GERD 03/13/2009  . Hyperlipidemia   . INSOMNIA, CHRONIC 09/11/2009  . Persistent atrial fibrillation ([place]) 04/07/2009   a. s/p afib ablation at [facility] x 2 in 2010.  Marland Kitchen POLYPECTOMY, HX OF 04/07/2009  . Premature atrial contractions   . PULMONARY NODULE 09/11/2009  . PVC's (premature ventricular contractions)   . Rosacea 03/13/2009  . SKIN CANCER, HX OF 03/13/2009    Past Surgical History:  Procedure Laterality Date  . ABLATION  2010   x2  . COLONOSCOPY W/ BIOPSIES AND POLYPECTOMY    . EYE SURGERY Bilateral    cataracts  . LUMBAR DISC SURGERY     RUPTURE  . LUMBAR LAMINECTOMY/DECOMPRESSION MICRODISCECTOMY N/A 04/20/2014   Procedure: LUMBAR TWO-THREE, LUMBAR THREE-FOUR, LUMAR FOUR-FIVE LAMINECTOMIES;  Surgeon: Newman Pies, MD;  Location: [place] [facility];  Service: Neurosurgery;  Laterality: N/A;  . POLYPECTOMY    .  SKIN CANCER EXCISION      There were no vitals filed for this visit.   Subjective Assessment - 01/29/21 1532    Subjective I am more sore today.  Patch doesn't seem to be helping.  I am wondering if going up to the blue band is making it worse    Patient Stated Goals to reduce shoulder pain and be more mobile    Currently in Pain? Yes    Pain Score 6     Pain Location Shoulder    Pain Orientation Right    Pain Descriptors / Indicators Sore    Pain Type Acute pain    Pain Onset More than a month ago    Multiple Pain Sites No                             [facility] Adult PT Treatment/Exercise - 01/29/21 0001      Shoulder Exercises: Seated   Other Seated Exercises posture and scap squeeze      Shoulder Exercises: Standing   Other Standing Exercises isometric abduction      Shoulder Exercises: Pulleys   Flexion 2 minutes      Modalities   Modalities Electrical Stimulation;Cryotherapy      Cryotherapy   Number Minutes Cryotherapy 20 Minutes  Cryotherapy Location Shoulder    Type of Cryotherapy Ice pack      Electrical Stimulation   Electrical Stimulation Location shoulder- right    Electrical Stimulation Action ifc    Electrical Stimulation Parameters to tolerance    Electrical Stimulation Goals Pain                    PT Short Term Goals - 01/25/21 1120      PT SHORT TERM GOAL #1   Title pt to demonstrate improved Rt shoulder flexion A/ROM > or = 100    Baseline 110 no pain    Status Achieved      PT SHORT TERM GOAL #2   Title pt to report  improved pain levels less than 5/10 during 50% of activity    Baseline most of the time less than 5/10    Status Achieved      PT SHORT TERM GOAL #3   Title pt to be I with  initial HEP    Status Achieved             PT Long Term Goals - 01/09/21 1102      PT LONG TERM GOAL #1   Title pt to demonstrate R shoulder flexion A/ROM > or = 105 to improve overhead reaching    Time 8    Period  Weeks    Status New    Target Date 03/06/21      PT LONG TERM GOAL #2   Title pt to report improved pain to less than 3/10 with 75% of activity    Time 8    Period Weeks    Status New    Target Date 03/06/21      PT LONG TERM GOAL #3   Title pt to be I with advanced HEP    Time 8    Period Weeks    Status New    Target Date 03/06/21      PT LONG TERM GOAL #4   Title pt to demonstrate improved Rt shoulder global strength to at least 4/5 for improved function with transfers   Simultaneous filing. User may not have seen previous data.   Time 8   Simultaneous filing. User may not have seen previous data.   Period Weeks   Simultaneous filing. User may not have seen previous data.   Status New    Target Date 03/06/21   Simultaneous filing. User may not have seen previous data.                Plan - 01/29/21 1507    Clinical Impression Statement Today's session focused more on pain management due to having 5-6/10 pain and states it has been sore with exercises.  Pt did wellwith IFC estim and may benefit from home TENS unit.  Pt did not feel the ionto was helping so we did not do any ionto patch today.  HEP was reviewed.  pt will benefit from skilled PT to work on gradual progression of strength and mobility with pain mangement for return to maximum funciton.    PT Treatment/Interventions ADLs/Self Care Home Management;DME Instruction;Gait training;Stair training;Functional mobility training;Neuromuscular re-education;Therapeutic exercise;Therapeutic activities;Manual techniques;Passive range of motion;Energy conservation;Moist Heat;Electrical Stimulation;Cryotherapy;Dry needling;Iontophoresis 4mg /ml Dexamethasone    PT Next Visit Plan stretching and strengthening, manual to Rt shoulder (pecs). modalities as needed possibly discuss home TENS if it helped    PT Home Exercise Plan Access Code: [email or web address]    Consulted and Agree  with Plan of Care Patient           Patient will benefit  from skilled therapeutic intervention in order to improve the following deficits and impairments:  Decreased activity tolerance,Pain,Impaired flexibility,Decreased strength,Decreased range of motion,Decreased mobility  Visit Diagnosis: Acute pain of right shoulder  Abnormal posture  Muscle weakness (generalized)  Cramp and spasm     Problem List Patient Active Problem List   Diagnosis Date Noted  . Ventricular bigeminy 04/08/2019  . Unstable angina ([place]) 04/07/2019  . Right sciatic nerve pain 07/18/2015  . Recurrent vertigo 07/18/2015  . BPH (benign prostatic hyperplasia) 06/22/2015  . CKD (chronic kidney disease) stage 3, GFR 30-59 ml/min (HCC) 12/22/2014  . Lumbar stenosis with neurogenic claudication 04/20/2014  . Spinal stenosis of lumbar region 12/15/2013  . Need for shingles vaccine 08/31/2012  . Prediabetes 10/16/2011  . CARCINOMA, SKIN, SQUAMOUS CELL 09/11/2009  . INSOMNIA, CHRONIC 09/11/2009  . PULMONARY NODULE 09/11/2009  . ATRIAL FIBRILLATION 04/07/2009  . BENIGN PROSTATIC HYPERTROPHY, HX OF 04/07/2009  . POLYPECTOMY, HX OF 04/07/2009  . Hyperlipidemia 03/13/2009  . Essential hypertension 03/13/2009  . GERD 03/13/2009  . ROSACEA 03/13/2009  . ACTINIC KERATOSIS 03/13/2009  . SKIN CANCER, HX OF 03/13/2009  . COLONIC POLYPS, HX OF 03/13/2009    Jule Ser, PT 01/29/2021, 3:42 PM  [facility] [address] [place], [place], [address] Phone: [phone]   Fax:  [phone]  Name: [name] MRN: [id number] Date of Birth: 08/17/1932

## 2021-01-31 DIAGNOSIS — D0472 Carcinoma in situ of skin of left lower limb, including hip: Secondary | ICD-10-CM | POA: Diagnosis not present

## 2021-02-01 ENCOUNTER — Ambulatory Visit: Payer: Medicare PPO | Admitting: Physical Therapy

## 2021-02-01 ENCOUNTER — Encounter: Payer: Self-pay | Admitting: Physical Therapy

## 2021-02-01 ENCOUNTER — Other Ambulatory Visit: Payer: Self-pay

## 2021-02-01 DIAGNOSIS — R293 Abnormal posture: Secondary | ICD-10-CM

## 2021-02-01 DIAGNOSIS — M25511 Pain in right shoulder: Secondary | ICD-10-CM | POA: Diagnosis not present

## 2021-02-01 DIAGNOSIS — M6281 Muscle weakness (generalized): Secondary | ICD-10-CM

## 2021-02-01 DIAGNOSIS — R252 Cramp and spasm: Secondary | ICD-10-CM | POA: Diagnosis not present

## 2021-02-01 DIAGNOSIS — M5416 Radiculopathy, lumbar region: Secondary | ICD-10-CM | POA: Diagnosis not present

## 2021-02-01 NOTE — Therapy (Signed)
[facility] [address], [place], [place], [address] Phone: [phone]   Fax:  [phone]  Physical Therapy Treatment  Patient Details  Name: [name] MRN: [id number] Date of Birth: Sep 09, 1931 Referring Provider (PT): Lynne Leader MD   Encounter Date: 02/01/2021   PT End of Session - 02/01/21 0853     Visit Number 7    Date for PT Re-Evaluation 03/06/21    Authorization Type [employer] -12 visits 5/18-7/15/22    Authorization - Visit Number 7    Authorization - Number of Visits 12    Progress Note Due on Visit 10    PT Start Time 4268    PT Stop Time 0930    PT Time Calculation (min) 40 min    Activity Tolerance Patient tolerated treatment well    Behavior During Therapy [facility] for tasks assessed/performed             Past Medical History:  Diagnosis Date   Actinic keratosis 03/13/2009   Arthritis    BENIGN PROSTATIC HYPERTROPHY, HX OF 04/07/2009   CARCINOMA, SKIN, SQUAMOUS CELL 09/11/2009   COLONIC POLYPS, HX OF 03/13/2009   Essential hypertension    GERD 03/13/2009   Hyperlipidemia    INSOMNIA, CHRONIC 09/11/2009   Persistent atrial fibrillation ([place]) 04/07/2009   a. s/p afib ablation at [facility] x 2 in 2010.   POLYPECTOMY, HX OF 04/07/2009   Premature atrial contractions    PULMONARY NODULE 09/11/2009   PVC's (premature ventricular contractions)    Rosacea 03/13/2009   SKIN CANCER, HX OF 03/13/2009    Past Surgical History:  Procedure Laterality Date   ABLATION  2010   x2   COLONOSCOPY W/ BIOPSIES AND POLYPECTOMY     EYE SURGERY Bilateral    cataracts   LUMBAR DISC SURGERY     RUPTURE   LUMBAR LAMINECTOMY/DECOMPRESSION MICRODISCECTOMY N/A 04/20/2014   Procedure: LUMBAR TWO-THREE, LUMBAR THREE-FOUR, LUMAR FOUR-FIVE LAMINECTOMIES;  Surgeon: Newman Pies, MD;  Location: [place] [facility];  Service: Neurosurgery;  Laterality: N/A;   POLYPECTOMY     SKIN CANCER EXCISION      There were no vitals filed  for this visit.   Subjective Assessment - 02/01/21 0855     Subjective The Estim was terrific! it gave me relief for almost 2 days, about 60% less pain.    Currently in Pain? Yes    Pain Score 1     Pain Location Shoulder    Pain Orientation Right    Pain Descriptors / Indicators Dull;Aching                               [facility] Adult PT Treatment/Exercise - 02/01/21 0001       Self-Care   Other Self-Care Comments  Using home TENS unit: why, where to purchase, how he could use it      Electrical Stimulation   Electrical Stimulation Location shoulder- right   ice pack for 10 min during to Rt shoulder   Electrical Stimulation Action IFC    Electrical Stimulation Parameters to tolerance                    PT Education - 02/01/21 0906     Education Details Home Tens unit info    Person(s) Educated Patient    Methods Explanation    Comprehension Verbalized understanding  PT Short Term Goals - 01/25/21 1120       PT SHORT TERM GOAL #1   Title pt to demonstrate improved Rt shoulder flexion A/ROM > or = 100    Baseline 110 no pain    Status Achieved      PT SHORT TERM GOAL #2   Title pt to report  improved pain levels less than 5/10 during 50% of activity    Baseline most of the time less than 5/10    Status Achieved      PT SHORT TERM GOAL #3   Title pt to be I with  initial HEP    Status Achieved               PT Long Term Goals - 01/09/21 1102       PT LONG TERM GOAL #1   Title pt to demonstrate R shoulder flexion A/ROM > or = 105 to improve overhead reaching    Time 8    Period Weeks    Status New    Target Date 03/06/21      PT LONG TERM GOAL #2   Title pt to report improved pain to less than 3/10 with 75% of activity    Time 8    Period Weeks    Status New    Target Date 03/06/21      PT LONG TERM GOAL #3   Title pt to be I with advanced HEP    Time 8    Period Weeks    Status New    Target Date  03/06/21      PT LONG TERM GOAL #4   Title pt to demonstrate improved Rt shoulder global strength to at least 4/5 for improved function with transfers   Simultaneous filing. User may not have seen previous data.   Time 8   Simultaneous filing. User may not have seen previous data.   Period Weeks   Simultaneous filing. User may not have seen previous data.   Status New    Target Date 03/06/21   Simultaneous filing. User may not have seen previous data.                  Plan - 02/01/21 0907     Clinical Impression Statement Pt reports he had a lot of relief "60%" less pain after the electirc stimulation treatment. He reports the pain relief has lasted about 1- 11/2 days and requested doing the treatmetn again. We discussed at length the home TENS unit. Pt did mention he just put a lot of money out for a lift chair and is not wanting to make any more purchases at this time, BUT he may possibly be interested later on in the year especially if his pain returns.    Personal Factors and Comorbidities Age;Fitness;Past/Current Experience    Examination-Activity Limitations Reach Overhead    Examination-Participation Restrictions Cleaning;Community Activity    Stability/Clinical Decision Making Stable/Uncomplicated    Rehab Potential Good    PT Frequency 2x / week    PT Duration 8 weeks    PT Treatment/Interventions ADLs/Self Care Home Management;DME Instruction;Gait training;Stair training;Functional mobility training;Neuromuscular re-education;Therapeutic exercise;Therapeutic activities;Manual techniques;Passive range of motion;Energy conservation;Moist Heat;Electrical Stimulation;Cryotherapy;Dry needling;Iontophoresis 4mg /ml Dexamethasone    PT Next Visit Plan Estim and isometrics    PT Home Exercise Plan Access Code: [email or web address]    Consulted and Agree with Plan of Care Patient             Patient  will benefit from skilled therapeutic intervention in order to improve the following  deficits and impairments:  Decreased activity tolerance, Pain, Impaired flexibility, Decreased strength, Decreased range of motion, Decreased mobility  Visit Diagnosis: Acute pain of right shoulder  Abnormal posture  Muscle weakness (generalized)  Cramp and spasm     Problem List Patient Active Problem List   Diagnosis Date Noted   Ventricular bigeminy 04/08/2019   Unstable angina ([place]) 04/07/2019   Right sciatic nerve pain 07/18/2015   Recurrent vertigo 07/18/2015   BPH (benign prostatic hyperplasia) 06/22/2015   CKD (chronic kidney disease) stage 3, GFR 30-59 ml/min ([place]) 12/22/2014   Lumbar stenosis with neurogenic claudication 04/20/2014   Spinal stenosis of lumbar region 12/15/2013   Need for shingles vaccine 08/31/2012   Prediabetes 10/16/2011   CARCINOMA, SKIN, SQUAMOUS CELL 09/11/2009   INSOMNIA, CHRONIC 09/11/2009   PULMONARY NODULE 09/11/2009   ATRIAL FIBRILLATION 04/07/2009   BENIGN PROSTATIC HYPERTROPHY, HX OF 04/07/2009   POLYPECTOMY, HX OF 04/07/2009   Hyperlipidemia 03/13/2009   Essential hypertension 03/13/2009   GERD 03/13/2009   ROSACEA 03/13/2009   ACTINIC KERATOSIS 03/13/2009   SKIN CANCER, HX OF 03/13/2009   COLONIC POLYPS, HX OF 03/13/2009    Vivan Vanderveer, PTA 02/01/2021, 9:12 AM  [facility] [address], [place], [place], [address] Phone: [phone]   Fax:  [phone]  Name: [name] MRN: [id number] Date of Birth: 06/30/1932

## 2021-02-06 ENCOUNTER — Encounter: Payer: Self-pay | Admitting: Physical Therapy

## 2021-02-06 ENCOUNTER — Ambulatory Visit: Payer: Medicare PPO | Admitting: Physical Therapy

## 2021-02-06 ENCOUNTER — Other Ambulatory Visit: Payer: Self-pay

## 2021-02-06 DIAGNOSIS — R293 Abnormal posture: Secondary | ICD-10-CM

## 2021-02-06 DIAGNOSIS — M25511 Pain in right shoulder: Secondary | ICD-10-CM | POA: Diagnosis not present

## 2021-02-06 DIAGNOSIS — R252 Cramp and spasm: Secondary | ICD-10-CM | POA: Diagnosis not present

## 2021-02-06 DIAGNOSIS — M6281 Muscle weakness (generalized): Secondary | ICD-10-CM | POA: Diagnosis not present

## 2021-02-06 NOTE — Therapy (Signed)
[facility] [address] [place], [place], [address] Phone: [phone]   Fax:  [phone]  Physical Therapy Treatment  Patient Details  Name: [name] MRN: [id number] Date of Birth: [birth year] Referring Provider (PT): Lynne Leader MD   Encounter Date: 02/06/2021   PT End of Session - 02/06/21 1109     Visit Number 8    Date for PT Re-Evaluation 03/06/21    Authorization Type Cohere -12 visits 5/18-7/15/22    Authorization - Visit Number 8    Authorization - Number of Visits 12    Progress Note Due on Visit 10    PT Start Time 1109    PT Stop Time 1275    PT Time Calculation (min) 39 min    Activity Tolerance Patient tolerated treatment well    Behavior During Therapy [facility] for tasks assessed/performed             Past Medical History:  Diagnosis Date   Actinic keratosis 03/13/2009   Arthritis    BENIGN PROSTATIC HYPERTROPHY, HX OF 04/07/2009   CARCINOMA, SKIN, SQUAMOUS CELL 09/11/2009   COLONIC POLYPS, HX OF 03/13/2009   Essential hypertension    GERD 03/13/2009   Hyperlipidemia    INSOMNIA, CHRONIC 09/11/2009   Persistent atrial fibrillation ([place]) 04/07/2009   a. s/p afib ablation at [facility] x 2 in 2010.   POLYPECTOMY, HX OF 04/07/2009   Premature atrial contractions    PULMONARY NODULE 09/11/2009   PVC's (premature ventricular contractions)    Rosacea 03/13/2009   SKIN CANCER, HX OF 03/13/2009    Past Surgical History:  Procedure Laterality Date   ABLATION  2010   x2   COLONOSCOPY W/ BIOPSIES AND POLYPECTOMY     EYE SURGERY Bilateral    cataracts   LUMBAR DISC SURGERY     RUPTURE   LUMBAR LAMINECTOMY/DECOMPRESSION MICRODISCECTOMY N/A 04/20/2014   Procedure: LUMBAR TWO-THREE, LUMBAR THREE-FOUR, LUMAR FOUR-FIVE LAMINECTOMIES;  Surgeon: Newman Pies, MD;  Location: [place] [facility];  Service: Neurosurgery;  Laterality: N/A;   POLYPECTOMY     SKIN CANCER EXCISION      There were no vitals filed  for this visit.   Subjective Assessment - 02/06/21 1115     Subjective I did not have any relief after using the Estim last time.    Currently in Pain? Yes    Pain Score 5     Pain Location Shoulder    Pain Orientation Right    Pain Descriptors / Indicators Sore    Aggravating Factors  Using arm    Pain Relieving Factors rest    Multiple Pain Sites No                               [facility] Adult PT Treatment/Exercise - 02/06/21 0001       Self-Care   Other Self-Care Comments  How to move his RTUE in more neutral that was less painful to do. pt could correctly demo with "less pain."      Shoulder Exercises: Isometric Strengthening   Flexion 5X5"    Flexion Limitations Given for HEP    Extension 5X5"    Extension Limitations Given for HEP      Shoulder Exercises: Stretch   Table Stretch - Flexion 5 reps    Table Stretch -Flexion Limitations Added to HEP      Electrical Stimulation   Electrical Stimulation Location shoulder-  right   ice pack for 10 min during to Rt shoulder   Electrical Stimulation Action IFC    Electrical Stimulation Parameters to tolerance sitting    Electrical Stimulation Goals Pain                    PT Education - 02/06/21 1135     Education Details HEP    Person(s) Educated Patient    Methods Explanation;Demonstration;Tactile cues;Verbal cues;Handout    Comprehension Returned demonstration;Verbalized understanding              PT Short Term Goals - 01/25/21 1120       PT SHORT TERM GOAL #1   Title pt to demonstrate improved Rt shoulder flexion A/ROM > or = 100    Baseline 110 no pain    Status Achieved      PT SHORT TERM GOAL #2   Title pt to report  improved pain levels less than 5/10 during 50% of activity    Baseline most of the time less than 5/10    Status Achieved      PT SHORT TERM GOAL #3   Title pt to be I with  initial HEP    Status Achieved               PT Long Term Goals - 01/09/21  1102       PT LONG TERM GOAL #1   Title pt to demonstrate R shoulder flexion A/ROM > or = 105 to improve overhead reaching    Time 8    Period Weeks    Status New    Target Date 03/06/21      PT LONG TERM GOAL #2   Title pt to report improved pain to less than 3/10 with 75% of activity    Time 8    Period Weeks    Status New    Target Date 03/06/21      PT LONG TERM GOAL #3   Title pt to be I with advanced HEP    Time 8    Period Weeks    Status New    Target Date 03/06/21      PT LONG TERM GOAL #4   Title pt to demonstrate improved Rt shoulder global strength to at least 4/5 for improved function with transfers   Simultaneous filing. User may not have seen previous data.   Time 8   Simultaneous filing. User may not have seen previous data.   Period Weeks   Simultaneous filing. User may not have seen previous data.   Status New    Target Date 03/06/21   Simultaneous filing. User may not have seen previous data.                  Plan - 02/06/21 1117     Clinical Impression Statement Pt reports he didn't receive much benefit with the Estim last session like he did prior. PTA eduated pt in how to move his arm in more of a [place] neutral position that he demonstrated was less painful. PTA suggested  performing flexion table slides for now instead of wall climbing ( too painful right now), pt correctly demo pain free. Pt was also given more isometrics he can perform in his chair. These were also nonpainful .    Personal Factors and Comorbidities Age;Fitness;Past/Current Experience    Examination-Activity Limitations Reach Overhead    Examination-Participation Restrictions Cleaning;Community Activity    Stability/Clinical Decision Making Stable/Uncomplicated  Rehab Potential Good    PT Frequency 2x / week    PT Duration 8 weeks    PT Treatment/Interventions ADLs/Self Care Home Management;DME Instruction;Gait training;Stair training;Functional mobility  training;Neuromuscular re-education;Therapeutic exercise;Therapeutic activities;Manual techniques;Passive range of motion;Energy conservation;Moist Heat;Electrical Stimulation;Cryotherapy;Dry needling;Iontophoresis 4mg /ml Dexamethasone    PT Home Exercise Plan Access Code: [email or web address]    [email or web address] and Agree with Plan of Care Patient             Patient will benefit from skilled therapeutic intervention in order to improve the following deficits and impairments:  Decreased activity tolerance, Pain, Impaired flexibility, Decreased strength, Decreased range of motion, Decreased mobility  Visit Diagnosis: Acute pain of right shoulder  Abnormal posture  Muscle weakness (generalized)     Problem List Patient Active Problem List   Diagnosis Date Noted   Ventricular bigeminy 04/08/2019   Unstable angina ([place]) 04/07/2019   Right sciatic nerve pain 07/18/2015   Recurrent vertigo 07/18/2015   BPH (benign prostatic hyperplasia) 06/22/2015   CKD (chronic kidney disease) stage 3, GFR 30-59 ml/min (HCC) 12/22/2014   Lumbar stenosis with neurogenic claudication 04/20/2014   Spinal stenosis of lumbar region 12/15/2013   Need for shingles vaccine 08/31/2012   Prediabetes 10/16/2011   CARCINOMA, SKIN, SQUAMOUS CELL 09/11/2009   INSOMNIA, CHRONIC 09/11/2009   PULMONARY NODULE 09/11/2009   ATRIAL FIBRILLATION 04/07/2009   BENIGN PROSTATIC HYPERTROPHY, HX OF 04/07/2009   POLYPECTOMY, HX OF 04/07/2009   Hyperlipidemia 03/13/2009   Essential hypertension 03/13/2009   GERD 03/13/2009   ROSACEA 03/13/2009   ACTINIC KERATOSIS 03/13/2009   SKIN CANCER, HX OF 03/13/2009   COLONIC POLYPS, HX OF 03/13/2009    Hasten Sweitzer, PTA 02/06/2021, 11:45 AM  [facility] [address] [place], [place], [address] Phone: [phone]   Fax:  [phone]  Name: [name] MRN: [id number] Date of Birth: 1932-03-23  Access Code:  [email or web address]: 06/15/2022Prepared by: Anderson Malta CochranExercises  Scapular Retraction with Resistance - 2 x daily - 7 x weekly - 2 sets - 10 reps  Seated Scapular Retraction - 5 x daily - 7 x weekly - 1 sets - 10 reps - 5 hold  Shoulder extension with resistance - Neutral - 2 x daily - 7 x weekly - 2 sets - 10 reps  Seated Correct Posture - 1 x daily - 7 x weekly - 3 sets - 10 reps  Supine Shoulder Flexion AAROM with Hands Clasped - 2 x daily - 7 x weekly - 1 sets - 10 reps - 10 hold  Shoulder Flexion Wall Slide with Towel - 1 x daily - 7 x weekly - 1 sets - 10 reps - 5 hold  Standing Isometric Shoulder Abduction with Doorway - Arm Bent - 1 x daily - 7 x weekly - 3 sets - 10 reps - 3 sec hold  Isometric Shoulder Extension at Wall - 2 x daily - 7 x weekly - 1 sets - 5 reps - 5 hold  Isometric Shoulder Flexion at Wall - 1 x daily - 7 x weekly - 1 sets - 5 reps - 5 hold  Seated Shoulder Flexion Towel Slide at Table Top - 2 x daily - 7 x weekly - 2 sets - 10 reps

## 2021-02-08 ENCOUNTER — Encounter: Payer: Self-pay | Admitting: Physical Therapy

## 2021-02-08 ENCOUNTER — Ambulatory Visit: Payer: Medicare PPO | Admitting: Physical Therapy

## 2021-02-08 ENCOUNTER — Other Ambulatory Visit: Payer: Self-pay

## 2021-02-08 DIAGNOSIS — R252 Cramp and spasm: Secondary | ICD-10-CM | POA: Diagnosis not present

## 2021-02-08 DIAGNOSIS — M6281 Muscle weakness (generalized): Secondary | ICD-10-CM

## 2021-02-08 DIAGNOSIS — R293 Abnormal posture: Secondary | ICD-10-CM

## 2021-02-08 DIAGNOSIS — M25511 Pain in right shoulder: Secondary | ICD-10-CM

## 2021-02-08 NOTE — Therapy (Addendum)
[facility] [address] [place], [place], [address] Phone: [phone]   Fax:  [phone]  Physical Therapy Treatment  Patient Details  Name: [name] MRN: [id number] Date of Birth: 09-04-1931 Referring Provider (PT): Lynne Leader MD   Encounter Date: 02/08/2021   PT End of Session - 02/08/21 0928     Visit Number 9    Date for PT Re-Evaluation 03/06/21    Authorization Type Cohere -12 visits 5/18-7/15/22    Authorization - Visit Number 9    Authorization - Number of Visits 12    Progress Note Due on Visit 10    PT Start Time 0928    PT Stop Time 0958    PT Time Calculation (min) 30 min    Activity Tolerance Patient limited by pain    Behavior During Therapy [facility] for tasks assessed/performed             Past Medical History:  Diagnosis Date   Actinic keratosis 03/13/2009   Arthritis    BENIGN PROSTATIC HYPERTROPHY, HX OF 04/07/2009   CARCINOMA, SKIN, SQUAMOUS CELL 09/11/2009   COLONIC POLYPS, HX OF 03/13/2009   Essential hypertension    GERD 03/13/2009   Hyperlipidemia    INSOMNIA, CHRONIC 09/11/2009   Persistent atrial fibrillation ([place]) 04/07/2009   a. s/p afib ablation at [facility] x 2 in 2010.   POLYPECTOMY, HX OF 04/07/2009   Premature atrial contractions    PULMONARY NODULE 09/11/2009   PVC's (premature ventricular contractions)    Rosacea 03/13/2009   SKIN CANCER, HX OF 03/13/2009    Past Surgical History:  Procedure Laterality Date   ABLATION  2010   x2   COLONOSCOPY W/ BIOPSIES AND POLYPECTOMY     EYE SURGERY Bilateral    cataracts   LUMBAR DISC SURGERY     RUPTURE   LUMBAR LAMINECTOMY/DECOMPRESSION MICRODISCECTOMY N/A 04/20/2014   Procedure: LUMBAR TWO-THREE, LUMBAR THREE-FOUR, LUMAR FOUR-FIVE LAMINECTOMIES;  Surgeon: Newman Pies, MD;  Location: [place] [facility];  Service: Neurosurgery;  Laterality: N/A;   POLYPECTOMY     SKIN CANCER EXCISION      There were no vitals filed for this  visit.   Subjective Assessment - 02/08/21 0930     Subjective Still having a lot of sharp pains. going to see the MD on the 27th.    Currently in Pain? Yes    Pain Score 4     Pain Location Shoulder    Pain Descriptors / Indicators Sore    Aggravating Factors  Using arm or just random    Pain Relieving Factors rest    Multiple Pain Sites No                [facility] PT Assessment - 02/08/21 0001       Observation/Other Assessments   Focus on Therapeutic Outcomes (FOTO)  48      AROM   Right Shoulder Flexion 105 Degrees      Strength   Right Shoulder Flexion 4-/5   no pain   Right Shoulder Extension 4-/5    Right Shoulder ABduction 3/5   Pain   Right Shoulder Internal Rotation 4-/5    Right Shoulder External Rotation 4-/5    Right Elbow Flexion 4+/5    Right Elbow Extension 4+/5                           [facility] Adult PT Treatment/Exercise - 02/08/21  0001       Shoulder Exercises: Isometric Strengthening   Flexion 5X5"    Extension 5X5"    External Rotation 5X5"    Internal Rotation 5X5"                    PT Education - 02/08/21 1000     Education Details Pt educated in why isometrics would be important to continue if he felt they did not increase his pain too much. Discussed importance of shoulder ROM.    Person(s) Educated Patient    Methods Explanation    Comprehension Verbalized understanding              PT Short Term Goals - 01/25/21 1120       PT SHORT TERM GOAL #1   Title pt to demonstrate improved Rt shoulder flexion A/ROM > or = 100    Baseline 110 no pain    Status Achieved      PT SHORT TERM GOAL #2   Title pt to report  improved pain levels less than 5/10 during 50% of activity    Baseline most of the time less than 5/10    Status Achieved      PT SHORT TERM GOAL #3   Title pt to be I with  initial HEP    Status Achieved               PT Long Term Goals - 01/09/21 1102       PT LONG TERM GOAL #1    Title pt to demonstrate R shoulder flexion A/ROM > or = 105 to improve overhead reaching    Time 8    Period Weeks    Status New    Target Date 03/06/21      PT LONG TERM GOAL #2   Title pt to report improved pain to less than 3/10 with 75% of activity    Time 8    Period Weeks    Status New    Target Date 03/06/21      PT LONG TERM GOAL #3   Title pt to be I with advanced HEP    Time 8    Period Weeks    Status New    Target Date 03/06/21      PT LONG TERM GOAL #4   Title pt to demonstrate improved Rt shoulder global strength to at least 4/5 for improved function with transfers   Simultaneous filing. User may not have seen previous data.   Time 8   Simultaneous filing. User may not have seen previous data.   Period Weeks   Simultaneous filing. User may not have seen previous data.   Status New    Target Date 03/06/21   Simultaneous filing. User may not have seen previous data.                  Plan - 02/08/21 0954     Clinical Impression Statement Pt reporting no change in overall status/function/pain. He plans to see the MD on 6/27 to discuss. Pt feels the exercises do not help and possibly invoke pain ( pain he describes as presenting after the exercise but does not linger.) Pt demonstrated no pain with isometrics in the clinic today. FOTO score essentially unchanged, strength a tad bit better in shoulder flexion ( nonpainful today). Most goals not met.    Personal Factors and Comorbidities Age;Fitness;Past/Current Experience    Examination-Activity Insurance account manager  Examination-Participation Restrictions Cleaning;Community Activity    Stability/Clinical Decision Making Stable/Uncomplicated    Rehab Potential Good    PT Frequency 2x / week    PT Duration 8 weeks    PT Treatment/Interventions ADLs/Self Care Home Management;DME Instruction;Gait training;Stair training;Functional mobility training;Neuromuscular re-education;Therapeutic  exercise;Therapeutic activities;Manual techniques;Passive range of motion;Energy conservation;Moist Heat;Electrical Stimulation;Cryotherapy;Dry needling;Iontophoresis 4mg /ml Dexamethasone    PT Next Visit Plan Pt on hold until he sees the MD.    PT Home Exercise Plan Access Code: [email or web address]    Consulted and Agree with Plan of Care Patient             Patient will benefit from skilled therapeutic intervention in order to improve the following deficits and impairments:  Decreased activity tolerance, Pain, Impaired flexibility, Decreased strength, Decreased range of motion, Decreased mobility  Visit Diagnosis: Acute pain of right shoulder  Abnormal posture  Muscle weakness (generalized)  Cramp and spasm     Problem List Patient Active Problem List   Diagnosis Date Noted   Ventricular bigeminy 04/08/2019   Unstable angina ([place]) 04/07/2019   Right sciatic nerve pain 07/18/2015   Recurrent vertigo 07/18/2015   BPH (benign prostatic hyperplasia) 06/22/2015   CKD (chronic kidney disease) stage 3, GFR 30-59 ml/min (HCC) 12/22/2014   Lumbar stenosis with neurogenic claudication 04/20/2014   Spinal stenosis of lumbar region 12/15/2013   Need for shingles vaccine 08/31/2012   Prediabetes 10/16/2011   CARCINOMA, SKIN, SQUAMOUS CELL 09/11/2009   INSOMNIA, CHRONIC 09/11/2009   PULMONARY NODULE 09/11/2009   ATRIAL FIBRILLATION 04/07/2009   BENIGN PROSTATIC HYPERTROPHY, HX OF 04/07/2009   POLYPECTOMY, HX OF 04/07/2009   Hyperlipidemia 03/13/2009   Essential hypertension 03/13/2009   GERD 03/13/2009   ROSACEA 03/13/2009   ACTINIC KERATOSIS 03/13/2009   SKIN CANCER, HX OF 03/13/2009   COLONIC POLYPS, HX OF 03/13/2009    Dijon Kohlman, PTA 02/08/2021, 10:02 AM  [facility] [address], [place], [place], [address] Phone: [phone]   Fax:  [phone]  Name: WILBERN PENNYPACKER MRN: [id number] Date of Birth:  Jul 13, 1932   PHYSICAL THERAPY DISCHARGE SUMMARY  Visits from Start of Care: 9  Current functional level related to goals / functional outcomes: See above related outcomes   Remaining deficits: See above details   Education / Equipment: HEP   Patient agrees to discharge. Patient goals were not met. Patient is being discharged due to the patient's request.  Gustavus Bryant, PT 03/05/21 2:03 PM

## 2021-02-11 ENCOUNTER — Encounter: Payer: Medicare PPO | Admitting: Physical Therapy

## 2021-02-13 ENCOUNTER — Telehealth: Payer: Self-pay | Admitting: Pharmacist

## 2021-02-13 NOTE — Chronic Care Management (AMB) (Signed)
Chronic Care Management Pharmacy Assistant   Name: [name]  MRN: [id number] DOB: 1932/07/12  Reason for Encounter: Disease State/ Hypertension Assessment Call.    Conditions to be addressed/monitored: HTN   Recent office visits:  01/02/21 [name] (PCP) -  seen for right shoulder pain. No medication changes and referral placed to Sports medicine. No follow up noted.   12/17/20 [name] (PCP) -  seen for tendinitis of right rotator cuff and other issues. No medication changes. Administered methylprednisolone 40mg  injection. Follow up in 6 months.   Recent consult visits:  02/01/21 Newman Pies MD [facility] Neurosurgery) - presented to clinic for Radiculopathy of lumbar regions, hypertension and BMI. Patient stopped Neurontin and follow up not noted.   01/07/21 Lynne Leader MD ( Sports Medicine) - seen for right shoulder pain. Referral placed to physical therapy. No medication changes. Follow up in 6 weeks.  Hospital visits:  None in previous 6 months  Medications: Outpatient Encounter Medications as of 02/13/2021  Medication Sig   acetaminophen (TYLENOL) 500 MG tablet Take 500 mg by mouth every 6 (six) hours as needed for mild pain.   amLODipine (NORVASC) 2.5 MG tablet TAKE 1 TABLET EVERY DAY   clobetasol ointment (TEMOVATE) 1.32 % Apply 1 application topically 2 (two) times daily as needed (Rash).    ELIQUIS 2.5 MG TABS tablet TAKE 1 TABLET TWICE DAILY   finasteride (PROSCAR) 5 MG tablet Take 1 tablet (5 mg total) by mouth daily.   furosemide (LASIX) 20 MG tablet Take 1 tablet (20 mg total) by mouth daily as needed for fluid.   gabapentin (NEURONTIN) 100 MG capsule TAKE 1 CAPSULE IN THE MORNING  AND TAKE 2 CAPSULES AT BEDTIME   glucosamine-chondroitin 500-400 MG tablet Take 2 tablets by mouth daily.   lansoprazole (PREVACID) 30 MG capsule TAKE 1 CAPSULE EVERY DAY AT 12 NOON   lovastatin (MEVACOR) 20 MG tablet TAKE 1 TABLET (20 MG TOTAL) BY MOUTH AT  BEDTIME.   metoprolol succinate (TOPROL-XL) 25 MG 24 hr tablet TAKE 1 TABLET EVERY DAY   metroNIDAZOLE (METROCREAM) 0.75 % cream APPLY TOPICALLY 3 TIMES A  WEEK ON THE DAYS HE SHAVES   nitroGLYCERIN (NITROSTAT) 0.4 MG SL tablet Place 1 tablet (0.4 mg total) under the tongue every 5 (five) minutes x 3 doses as needed for chest pain.   SYMBICORT 160-4.5 MCG/ACT inhaler TAKE 2 PUFFS BY MOUTH TWICE A DAY   tamsulosin (FLOMAX) 0.4 MG CAPS capsule TAKE 1 CAPSULE EVERY DAY   vitamin B-12 (CYANOCOBALAMIN) 500 MCG tablet Take 500 mcg by mouth daily.   VITAMIN D PO Take 400 Units by mouth daily.    No facility-administered encounter medications on file as of 02/13/2021.   Reviewed chart prior to disease state call. Spoke with patient regarding BP  Recent Office Vitals: BP Readings from Last 3 Encounters:  01/07/21 128/86  01/02/21 130/62  12/17/20 132/64   Pulse Readings from Last 3 Encounters:  01/07/21 67  01/02/21 78  12/17/20 73    Wt Readings from Last 3 Encounters:  01/07/21 217 lb (98.4 kg)  01/02/21 207 lb 8 oz (94.1 kg)  12/17/20 209 lb 8 oz (95 kg)     Kidney Function Lab Results  Component Value Date/Time   CREATININE 1.23 11/15/2020 09:18 AM   CREATININE 1.53 (H) 03/27/2020 04:46 PM   CREATININE 1.56 (H) 11/03/2019 10:15 AM   GFR 49.80 (L) 03/16/2019 09:12 AM   GFRNONAA 40 (L) 11/03/2019 10:15  AM   GFRAA 46 (L) 11/03/2019 10:15 AM    BMP Latest Ref Rng & Units 11/15/2020 03/27/2020 11/03/2019  Glucose 65 - 99 mg/dL 109(H) 128(H) 111(H)  BUN 8 - 27 mg/dL 19 22 20   Creatinine 0.76 - 1.27 mg/dL 1.23 1.53(H) 1.56(H)  BUN/Creat Ratio 10 - 24 15 14 13   Sodium 134 - 144 mmol/L 141 138 139  Potassium 3.5 - 5.2 mmol/L 4.4 4.4 4.4  Chloride 96 - 106 mmol/L 104 106 103  CO2 20 - 29 mmol/L 21 23 21   Calcium 8.6 - 10.2 mg/dL 8.9 9.0 9.2    Current antihypertensive regimen:  Amlodipine 2.5mg  - take once daily.  Metoprolol 25mg  - take once daily.   How often are you checking  your Blood Pressure?  Current home BP readings:  What recent interventions/DTPs have been made by any provider to improve Blood Pressure control since last CPP Visit: None..  Any recent hospitalizations or ED visits since last visit with CPP? No What diet changes have been made to improve Blood Pressure Control?  What exercise is being done to improve your Blood Pressure Control?    Adherence Review: Is the patient currently on ACE/ARB medication? No Does the patient have >5 day gap between last estimated fill dates? Yes  Notes: Spoke with Sonam at [facility] and verified patients last fill date for the Lovastatin 20mg  is actually 01/12/21 and not what is listed in chart.  Multiple unsuccessful attempts to reach patient by phone.   Star Rating Drugs:  Lovastatin 20mg  - last filled on 06/15/20 90DS at [facility].   [place]re[place]  Clinical Pharmacist Assistant [phone]

## 2021-02-15 NOTE — Progress Notes (Signed)
I, Peterson Lombard, LAT, ATC acting as a scribe for Lynne Leader, MD.  [name] is a 87 y.o. male who presents to [place] at [facility] today for f/u R shoulder pain ongoing since early April. Pt was last seen by Dr. Georgina Snell on 01/07/21 and was referred to PT of which he's completed 9 visits. Today, pt reports that his R shoulder pain has not improved much.  He locates his pain to his R lateral upper arm.  He con't to take Tylenol bid.  Dx imaging: 01/07/21 R shoulder XR  Pertinent review of systems: No fevers or chills  Relevant historical information: Hypertension   Exam:  BP 138/78 (BP Location: Right Arm, Patient Position: Sitting, Cuff Size: Normal)   Pulse 71   Ht 6\' 2"  (1.88 m)   Wt 206 lb 12.8 oz (93.8 kg)   SpO2 97%   BMI 26.55 kg/m  General: Well Developed, well nourished, and in no acute distress.   MSK: Right shoulder normal-appearing nontender decreased abduction range of motion. Strength decreased abduction.    Lab and Radiology Results  Procedure: Real-time Ultrasound Guided Injection of right shoulder subacromial bursa Device: Philips Affiniti 50G Images permanently stored and available for review in PACS Ultrasound evaluation prior to injection reveals moderate subacromial bursitis Verbal informed consent obtained.  Discussed risks and benefits of procedure. Warned about infection bleeding damage to structures skin hypopigmentation and fat atrophy among others. Patient expresses understanding and agreement Time-out conducted.   Noted no overlying erythema, induration, or other signs of local infection.   Skin prepped in a sterile fashion.   Local anesthesia: Topical Ethyl chloride.   With sterile technique and under real time ultrasound guidance: 40 mg of Kenalog and 2 mL of Marcaine injected into subacromial bursa. Fluid seen entering the bursa.   Completed without difficulty   Pain immediately resolved suggesting accurate  placement of the medication.   Advised to call if fevers/chills, erythema, induration, drainage, or persistent bleeding.   Images permanently stored and available for review in the ultrasound unit.  Impression: Technically successful ultrasound guided injection.    EXAM: RIGHT SHOULDER - 2+ VIEW   COMPARISON:  None.   FINDINGS: There is no evidence of fracture or dislocation. Degenerative changes noted in the acromioclavicular joint. No worrisome lytic or sclerotic osseous abnormality. Soft tissues are unremarkable.   IMPRESSION: Mild AC joint degenerative changes.  No acute bony abnormality.     Electronically Signed   By: Misty Stanley M.D.   On: 01/09/2021 08:48 I, Lynne Leader, personally (independently) visualized and performed the interpretation of the images attached in this note.       Assessment and Plan: 87 y.o. male with right shoulder pain thought to be due to rotator cuff tendinitis and subacromial bursitis.  Patient has had good trial of conservative management with physical therapy.  Prior to referral he did have a unguided shoulder injection which did not help much.  After discussion of treatment plan and options today with family proceed with trial of ultrasound-guided subacromial injection.  This provided great benefit immediately in clinic indicating that his main pain generator is probably that subacromial bursitis seen on ultrasound.  Hopefully with the injection and with continued home exercise program he will have good benefit.  If not could consider MRI as next step although he is not an ideal surgical candidate.  MRI still could be helpful as it may guide further treatment options.  And in a pinch  surgery is still an option.   PDMP not reviewed this encounter. Orders Placed This Encounter  Procedures   [place] LIMITED JOINT SPACE STRUCTURES UP RIGHT(NO LINKED CHARGES)    Order Specific Question:   Reason for Exam (SYMPTOM  OR DIAGNOSIS REQUIRED)    Answer:    R shoulder pain    Order Specific Question:   Preferred imaging location?    Answer:   [place]   No orders of the defined types were placed in this encounter.    Discussed warning signs or symptoms. Please see discharge instructions. Patient expresses understanding.   The above documentation has been reviewed and is accurate and complete Lynne Leader, M.D.

## 2021-02-15 NOTE — Telephone Encounter (Cosign Needed)
2nd attempt

## 2021-02-18 ENCOUNTER — Other Ambulatory Visit: Payer: Self-pay

## 2021-02-18 ENCOUNTER — Ambulatory Visit: Payer: Self-pay

## 2021-02-18 ENCOUNTER — Ambulatory Visit (INDEPENDENT_AMBULATORY_CARE_PROVIDER_SITE_OTHER): Payer: Medicare PPO | Admitting: Family Medicine

## 2021-02-18 ENCOUNTER — Encounter: Payer: Self-pay | Admitting: Family Medicine

## 2021-02-18 VITALS — BP 138/78 | HR 71 | Ht 74.0 in | Wt 206.8 lb

## 2021-02-18 DIAGNOSIS — M25511 Pain in right shoulder: Secondary | ICD-10-CM

## 2021-02-18 NOTE — Patient Instructions (Addendum)
Good to see you today.  You had a R shoulder injection.  Call or go to the ER if you develop a large red swollen joint with extreme pain or oozing puss.   Follow-up as needed.  Keep doing the home exercises.   If not better let me know. I could order an MRI.  But I dont want to.   I can do the injection every 3 month.

## 2021-02-18 NOTE — Telephone Encounter (Cosign Needed)
3rd attempt

## 2021-02-27 ENCOUNTER — Ambulatory Visit (INDEPENDENT_AMBULATORY_CARE_PROVIDER_SITE_OTHER): Payer: Medicare PPO | Admitting: Family Medicine

## 2021-02-27 ENCOUNTER — Encounter: Payer: Self-pay | Admitting: Family Medicine

## 2021-02-27 ENCOUNTER — Other Ambulatory Visit: Payer: Self-pay

## 2021-02-27 VITALS — BP 142/68 | HR 76 | Temp 98.0°F | Wt 203.9 lb

## 2021-02-27 DIAGNOSIS — R39198 Other difficulties with micturition: Secondary | ICD-10-CM | POA: Diagnosis not present

## 2021-02-27 DIAGNOSIS — R300 Dysuria: Secondary | ICD-10-CM

## 2021-02-27 LAB — POCT URINALYSIS DIPSTICK
Bilirubin, UA: NEGATIVE
Blood, UA: NEGATIVE
Glucose, UA: NEGATIVE
Ketones, UA: NEGATIVE
Leukocytes, UA: NEGATIVE
Nitrite, UA: NEGATIVE
Protein, UA: NEGATIVE
Spec Grav, UA: 1.025 (ref 1.010–1.025)
Urobilinogen, UA: 0.2 E.U./dL
pH, UA: 6 (ref 5.0–8.0)

## 2021-02-27 NOTE — Progress Notes (Signed)
Established Patient Office Visit  Subjective:  Patient ID: [name], male    DOB: 1931-12-15  Age: 87 y.o. MRN: [id number]  CC:  Chief Complaint  Patient presents with   Urinary Tract Infection    Urinary frequency, dysuria, x 3 weeks    HPI [name] presents for 3-week history of some urine frequency and occasional burning with urination.  No hematuria.  No flank pain.  No fevers or chills.  He does have history of BPH and longstanding history of slow stream.  He has been on Flomax and taking regularly and we added finasteride 5 mg daily about 5 months ago.  He has not seen much improvement.  Sometimes gets up as much as every 2 hours at night to urinate and frequently goes every 2 hours during the day.  Does still have slow dribbling stream at times.  Does not take any anticholinergic medications.  Past Medical History:  Diagnosis Date   Actinic keratosis 03/13/2009   Arthritis    BENIGN PROSTATIC HYPERTROPHY, HX OF 04/07/2009   CARCINOMA, SKIN, SQUAMOUS CELL 09/11/2009   COLONIC POLYPS, HX OF 03/13/2009   Essential hypertension    GERD 03/13/2009   Hyperlipidemia    INSOMNIA, CHRONIC 09/11/2009   Persistent atrial fibrillation (HCC) 04/07/2009   a. s/p afib ablation at [facility] x 2 in 2010.   POLYPECTOMY, HX OF 04/07/2009   Premature atrial contractions    PULMONARY NODULE 09/11/2009   PVC's (premature ventricular contractions)    Rosacea 03/13/2009   SKIN CANCER, HX OF 03/13/2009    Past Surgical History:  Procedure Laterality Date   ABLATION  2010   x2   COLONOSCOPY W/ BIOPSIES AND POLYPECTOMY     EYE SURGERY Bilateral    cataracts   LUMBAR DISC SURGERY     RUPTURE   LUMBAR LAMINECTOMY/DECOMPRESSION MICRODISCECTOMY N/A 04/20/2014   Procedure: LUMBAR TWO-THREE, LUMBAR THREE-FOUR, LUMAR FOUR-FIVE LAMINECTOMIES;  Surgeon: Tressie Stalker, MD;  Location: [facility];  Service: Neurosurgery;  Laterality: N/A;   POLYPECTOMY     SKIN CANCER EXCISION      Family  History  Problem Relation Age of Onset   Cancer Mother    Cancer Father 21       COLON   Heart attack Father    Heart disease Father    Heart disease Sister    CAD Sister    Heart disease Brother    CAD Brother    CAD Brother    Heart disease Brother     Social History   Socioeconomic History   Marital status: Married    Spouse name: Not on file   Number of children: Not on file   Years of education: Not on file   Highest education level: Not on file  Occupational History   Not on file  Tobacco Use   Smoking status: Former    Packs/day: 1.00    Years: 30.00    Pack years: 30.00    Types: Cigarettes    Quit date: 03/13/1988    Years since quitting: 32.9   Smokeless tobacco: Current    Types: Chew  Substance and Sexual Activity   Alcohol use: No   Drug use: No   Sexual activity: Not on file  Other Topics Concern   Not on file  Social History Narrative   Not on file   Social Determinants of Health   Financial Resource Strain: Low Risk    Difficulty of Paying Living  Expenses: Not very hard  Food Insecurity: Not on file  Transportation Needs: No Transportation Needs   Lack of Transportation (Medical): No   Lack of Transportation (Non-Medical): No  Physical Activity: Not on file  Stress: Not on file  Social Connections: Not on file  Intimate Partner Violence: Not on file    Outpatient Medications Prior to Visit  Medication Sig Dispense Refill   acetaminophen (TYLENOL) 500 MG tablet Take 500 mg by mouth every 6 (six) hours as needed for mild pain.     amLODipine (NORVASC) 2.5 MG tablet TAKE 1 TABLET EVERY DAY 90 tablet 1   clobetasol ointment (TEMOVATE) 7.10 % Apply 1 application topically 2 (two) times daily as needed (Rash).      ELIQUIS 2.5 MG TABS tablet TAKE 1 TABLET TWICE DAILY 180 tablet 1   finasteride (PROSCAR) 5 MG tablet Take 1 tablet (5 mg total) by mouth daily. 90 tablet 0   furosemide (LASIX) 20 MG tablet Take 1 tablet (20 mg total) by mouth  daily as needed for fluid.     gabapentin (NEURONTIN) 100 MG capsule TAKE 1 CAPSULE IN THE MORNING  AND TAKE 2 CAPSULES AT BEDTIME 90 capsule 1   glucosamine-chondroitin 500-400 MG tablet Take 2 tablets by mouth daily.     lansoprazole (PREVACID) 30 MG capsule TAKE 1 CAPSULE EVERY DAY AT 12 NOON 90 capsule 3   lovastatin (MEVACOR) 20 MG tablet TAKE 1 TABLET (20 MG TOTAL) BY MOUTH AT BEDTIME. 90 tablet 1   metoprolol succinate (TOPROL-XL) 25 MG 24 hr tablet TAKE 1 TABLET EVERY DAY 90 tablet 1   metroNIDAZOLE (METROCREAM) 0.75 % cream APPLY TOPICALLY 3 TIMES A  WEEK ON THE DAYS HE SHAVES 45 g 1   nitroGLYCERIN (NITROSTAT) 0.4 MG SL tablet Place 1 tablet (0.4 mg total) under the tongue every 5 (five) minutes x 3 doses as needed for chest pain. 25 tablet 0   SYMBICORT 160-4.5 MCG/ACT inhaler TAKE 2 PUFFS BY MOUTH TWICE A DAY 30.6 each 3   tamsulosin (FLOMAX) 0.4 MG CAPS capsule TAKE 1 CAPSULE EVERY DAY 90 capsule 3   vitamin B-12 (CYANOCOBALAMIN) 500 MCG tablet Take 500 mcg by mouth daily.     VITAMIN D PO Take 400 Units by mouth daily.      No facility-administered medications prior to visit.    Allergies  Allergen Reactions   Amoxicillin-Pot Clavulanate Diarrhea and Nausea Only   Losartan Other (See Comments) and Nausea And Vomiting    Elevated creatinine levels Elevated creatinine levels    ROS Review of Systems  Constitutional:  Negative for chills and fever.  Genitourinary:  Positive for difficulty urinating and frequency. Negative for flank pain, hematuria and testicular pain.     Objective:    Physical Exam Vitals reviewed.  Constitutional:      Appearance: Normal appearance.  Cardiovascular:     Rate and Rhythm: Normal rate and regular rhythm.  Pulmonary:     Effort: Pulmonary effort is normal.     Breath sounds: Normal breath sounds.  Neurological:     Mental Status: He is alert.    BP (!) 142/68 (BP Location: Left Arm, Patient Position: Sitting, Cuff Size: Normal)    Pulse 76   Temp 98 F (36.7 C) (Oral)   Wt 203 lb 14.4 oz (92.5 kg)   SpO2 96%   BMI 26.18 kg/m  Wt Readings from Last 3 Encounters:  02/27/21 203 lb 14.4 oz (92.5 kg)  02/18/21 206 lb  12.8 oz (93.8 kg)  01/07/21 217 lb (98.4 kg)     Health Maintenance Due  Topic Date Due   Zoster Vaccines- Shingrix (1 of 2) Never done   COVID-19 Vaccine (3 - Pfizer risk series) 10/28/2019   TETANUS/TDAP  08/29/2020    There are no preventive care reminders to display for this patient.  Lab Results  Component Value Date   TSH 2.32 03/27/2020   Lab Results  Component Value Date   WBC 7.1 03/27/2020   HGB 15.4 03/27/2020   HCT 44.6 03/27/2020   MCV 97.6 03/27/2020   PLT 222 03/27/2020   Lab Results  Component Value Date   NA 141 11/15/2020   K 4.4 11/15/2020   CO2 21 11/15/2020   GLUCOSE 109 (H) 11/15/2020   BUN 19 11/15/2020   CREATININE 1.23 11/15/2020   BILITOT 0.5 11/15/2020   ALKPHOS 69 11/15/2020   AST 13 11/15/2020   ALT 7 11/15/2020   PROT 6.1 11/15/2020   ALBUMIN 4.2 11/15/2020   CALCIUM 8.9 11/15/2020   ANIONGAP 10 04/07/2019   EGFR 57 (L) 11/15/2020   GFR 49.80 (L) 03/16/2019   Lab Results  Component Value Date   CHOL 131 11/15/2020   Lab Results  Component Value Date   HDL 33 (L) 11/15/2020   Lab Results  Component Value Date   LDLCALC 74 11/15/2020   Lab Results  Component Value Date   TRIG 138 11/15/2020   Lab Results  Component Value Date   CHOLHDL 4.0 11/15/2020   Lab Results  Component Value Date   HGBA1C 6.1 (A) 01/02/2021      Assessment & Plan:   Dysuria.  Patient has some urine frequency and continued nocturia with slow stream.  Urine dipstick is completely normal.  No evidence for infection.  Suspect significant BPH symptoms.  We suggested the following  -Avoid any anticholinergic medications such as diphenhydramine.  Discussed risk of acute urinary retention -Continue Flomax and finasteride -Set up urology referral to discuss  further evaluation.   No orders of the defined types were placed in this encounter.   Follow-up: No follow-ups on file.    Carolann Littler, MD

## 2021-03-15 IMAGING — US US ABDOMEN COMPLETE
1 series · 13 of 25 positions shown · non-contrast
Comparison: None.

CLINICAL DATA: Upper abdominal pain

EXAM:
ABDOMEN ULTRASOUND COMPLETE

[Series 1: us abdomen complete · 0.26mm/px · 13 of 83 slices shown]
[im 1/83]
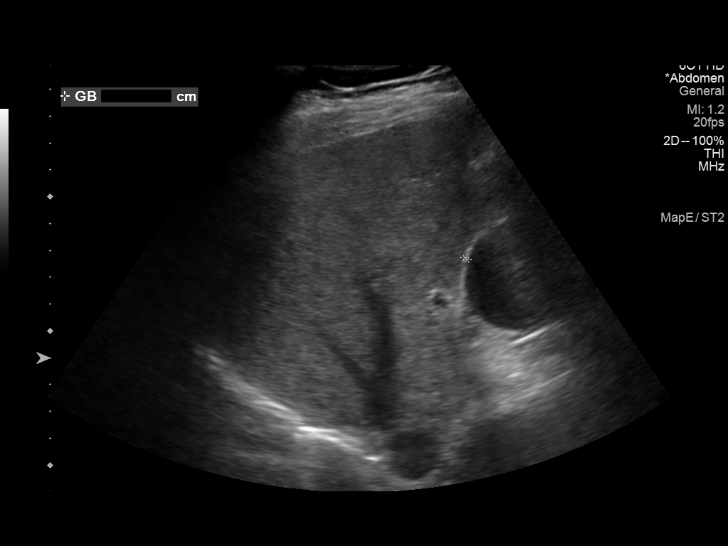
[im 7/83]
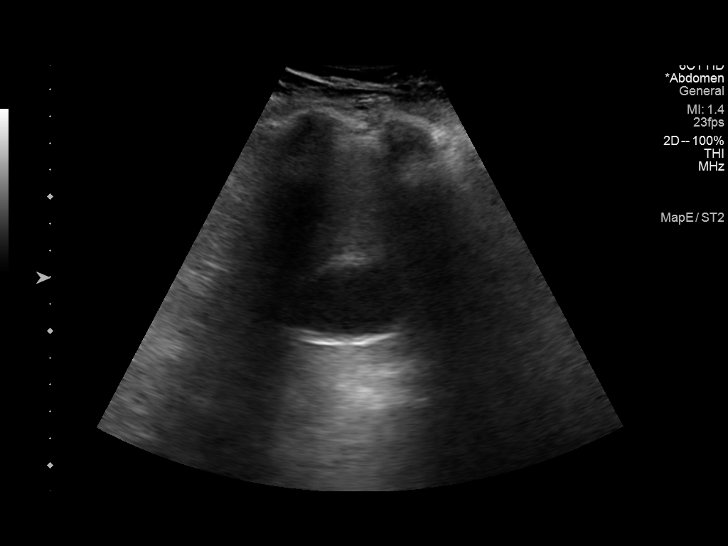
[im 14/83]
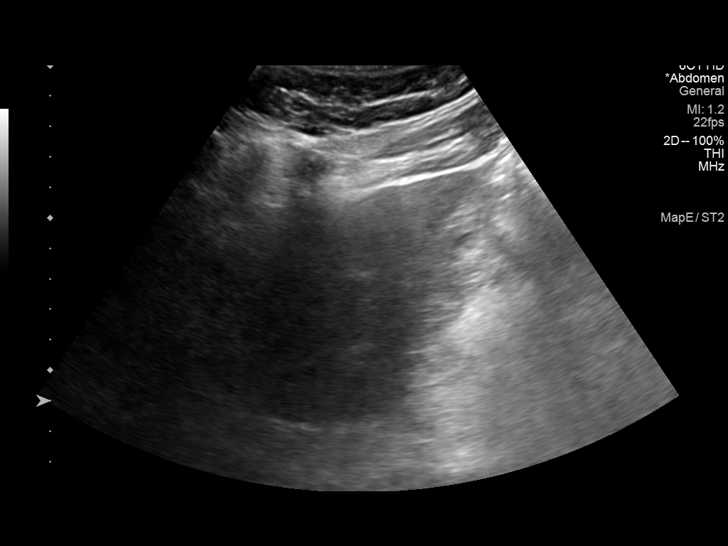
[im 21/83]
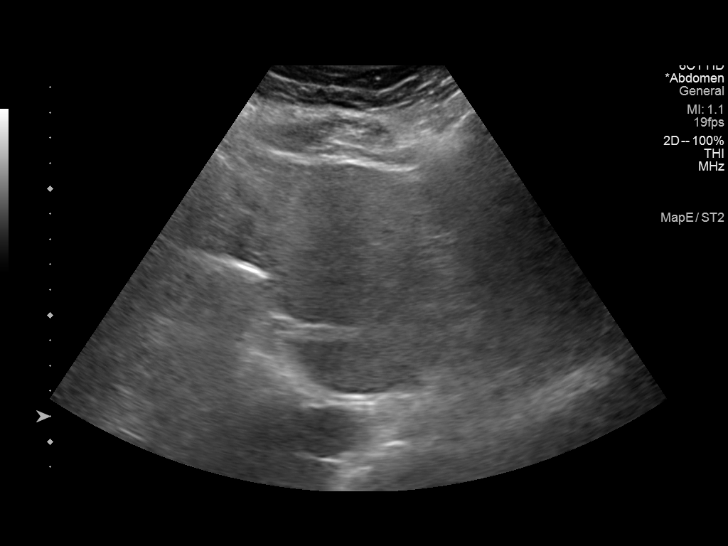
[im 28/83]
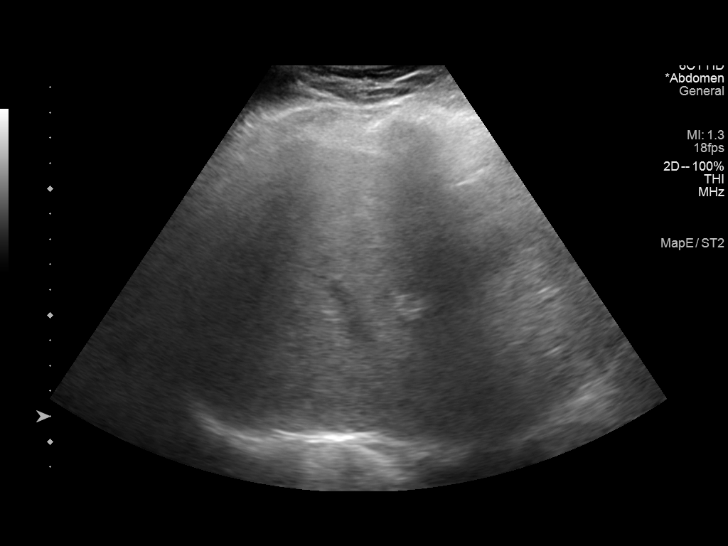
[im 35/83]
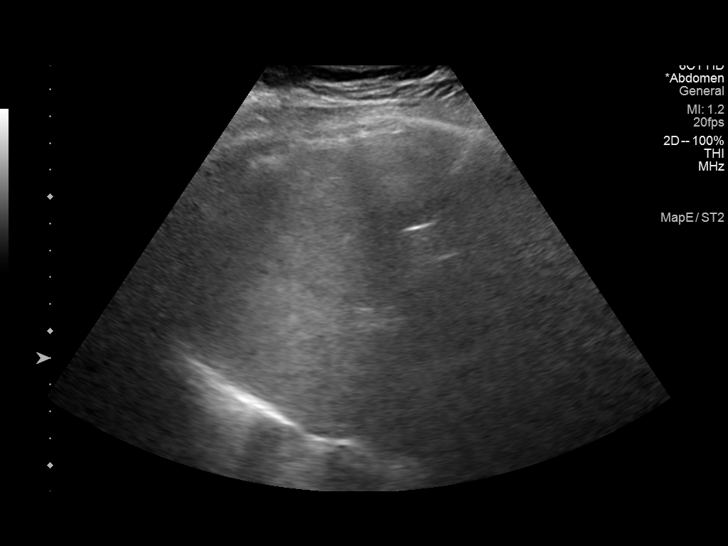
[im 42/83]
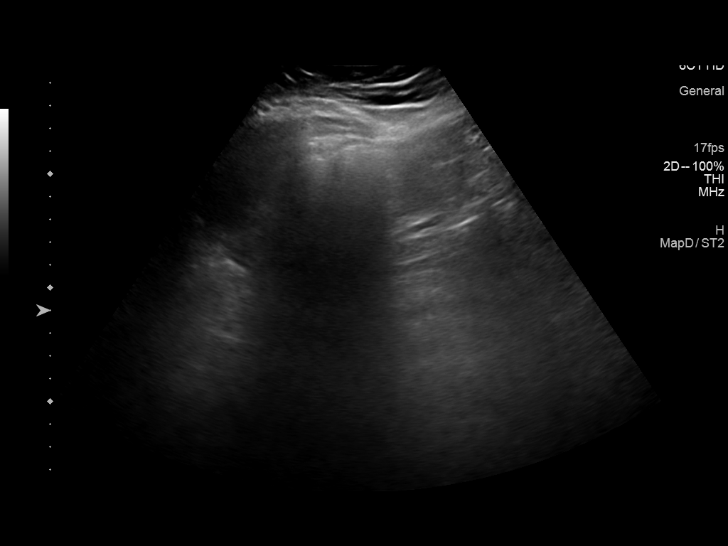
[im 48/83]
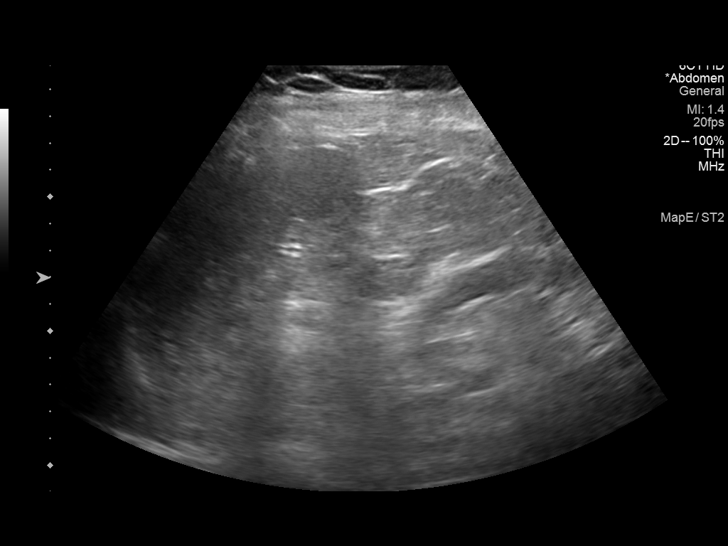
[im 55/83]
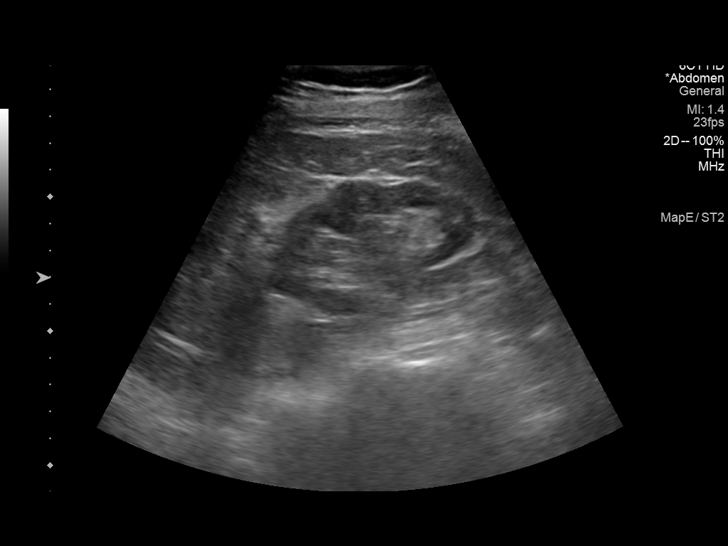
[im 62/83]
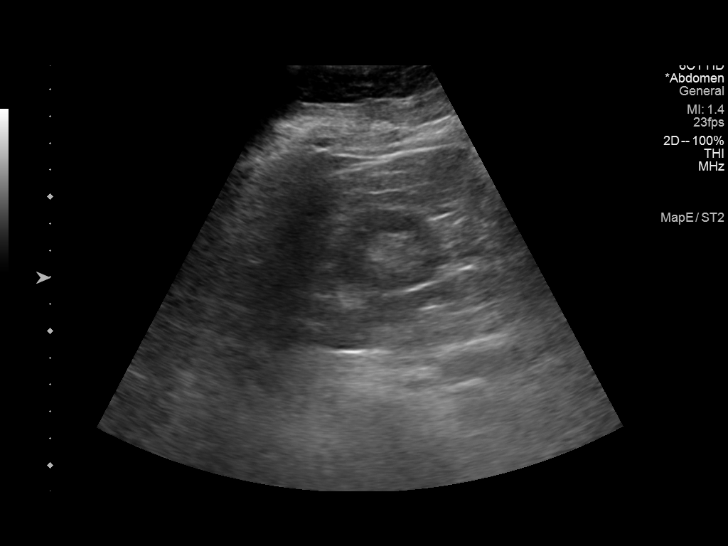
[im 69/83]
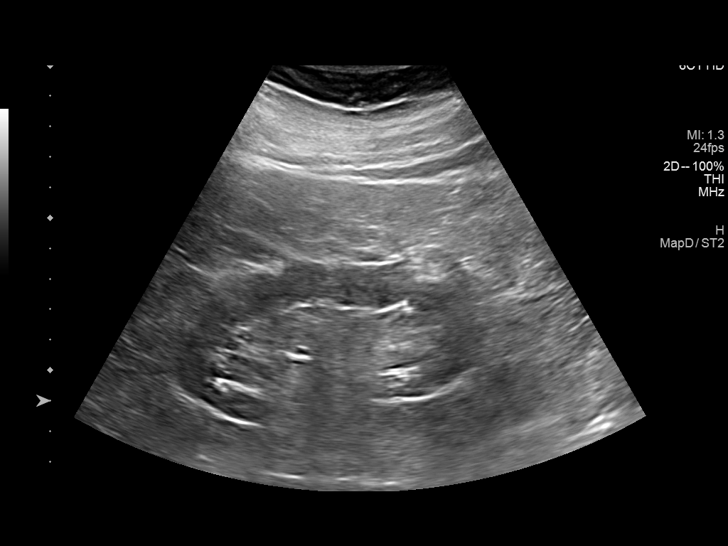
[im 76/83]
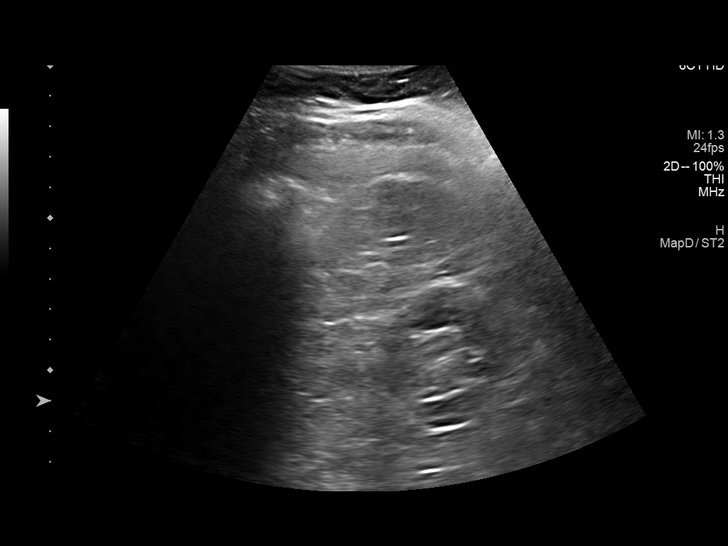
[im 83/83]
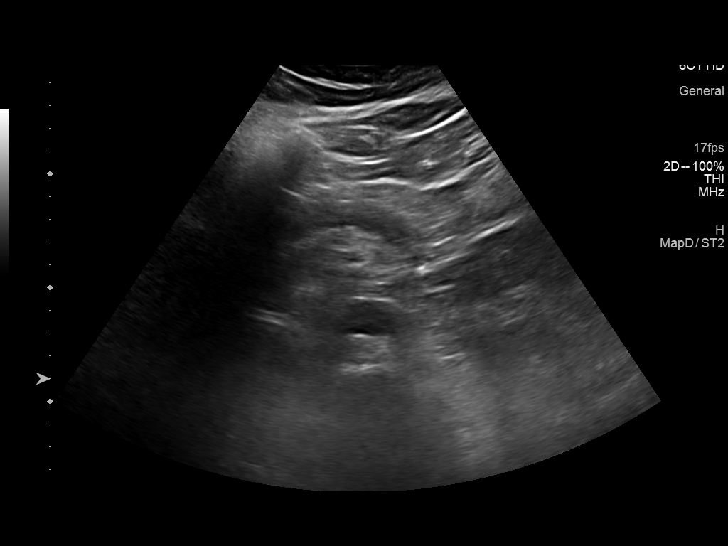

[13 of 25 positions shown; findings below may reference images not displayed]

FINDINGS: Gallbladder: No gallstones or wall thickening visualized. There is
no appreciable pericholecystic fluid. No sonographic Murphy sign
noted by sonographer.

Common bile duct: Diameter: 2 mm. No evident intrahepatic, common
hepatic, or common bile dilatation.

Liver: No focal lesion identified. Liver echogenicity overall
increased. Portal vein is patent on color Doppler imaging with
normal direction of blood flow towards the liver.

IVC: Nearly completely obscured by gas.

Pancreas: Visualized portion unremarkable. Most of pancreas obscured
by gas.

Spleen: Size and appearance within normal limits.

Right Kidney: Length: 9.0 cm. Echogenicity within normal limits. No
mass or hydronephrosis visualized.

Left Kidney: Length: 9.7 cm. Echogenicity within normal limits. No
mass or hydronephrosis visualized.

Abdominal aorta: No aneurysm visualized. Portions of aorta obscured
by gas.

Other findings: No evident ascites.
IMPRESSION: 1. Diffuse increased liver echogenicity, a finding indicative of
hepatic steatosis. No focal liver lesions appreciable.

2. Inferior vena cava obscured by gas. Portions of aorta and
pancreas obscured by gas. Visualized portions of the structures
appear unremarkable.

3.  Study otherwise unremarkable.

## 2021-04-05 DIAGNOSIS — N411 Chronic prostatitis: Secondary | ICD-10-CM | POA: Diagnosis not present

## 2021-04-05 DIAGNOSIS — R3916 Straining to void: Secondary | ICD-10-CM | POA: Diagnosis not present

## 2021-04-05 DIAGNOSIS — R3912 Poor urinary stream: Secondary | ICD-10-CM | POA: Diagnosis not present

## 2021-04-05 DIAGNOSIS — N401 Enlarged prostate with lower urinary tract symptoms: Secondary | ICD-10-CM | POA: Diagnosis not present

## 2021-04-05 DIAGNOSIS — R300 Dysuria: Secondary | ICD-10-CM | POA: Diagnosis not present

## 2021-04-08 ENCOUNTER — Telehealth: Payer: Self-pay | Admitting: Pharmacist

## 2021-04-08 NOTE — Chronic Care Management (AMB) (Signed)
    Chronic Care Management Pharmacy Assistant   Name: [name]  MRN: [id number] DOB: Oct 07, 1931  04/08/21-  Called patient to remind of appointment with Jeni Salles) on (04/09/21 at 9:30am in office.)   No answer, left message of appointment date, time and type of appointment (either telephone or in person). Left message to have all medications, supplements, blood pressure and/or blood sugar logs available during appointment and to return call if need to reschedule.  Notes: Spoke with [name] at [facility] who states the patient last filled lovastatin on 03/28/21 90 day supply.   Care Gaps:  AWV - message sent to Ramond Craver CMA to schedule. Zoster vaccines - never done Covid - 19 vaccine booster - overdue since 10/28/19 Tetanus/TDAP - overdue since 08/29/20 Flu vaccine  - due  Star Rating Drug:  Lovastatin '20mg'$  - last filled on 06/15/20 90DS at [facility].  Any gaps in medications fill history? No.  [place]  Clinical Pharmacist Assistant [phone]

## 2021-04-09 ENCOUNTER — Ambulatory Visit: Payer: Medicare PPO

## 2021-04-09 NOTE — Progress Notes (Deleted)
Chronic Care Management Pharmacy Note  04/09/2021 Name:  [name] MRN:  [id number] DOB:  09-04-1931  Summary: ***  Recommendations/Changes made from today's visit: ***  Plan: ***   Subjective: [name] is an 87 y.o. year old male who is a primary patient of Burchette, Alinda Sierras, MD.  The CCM team was consulted for assistance with disease management and care coordination needs.    Engaged with patient face to face for follow up visit in response to provider referral for pharmacy case management and/or care coordination services.   Consent to Services:  The patient was given information about Chronic Care Management services, agreed to services, and gave verbal consent prior to initiation of services.  Please see initial visit note for detailed documentation.   Patient Care Team: [name], MD as PCP - General Sueanne Margarita, MD as PCP - Cardiology (Cardiology) Germaine Pomfret, [facility] as Pharmacist (Pharmacist)  Recent office visits: 02/27/21 Carolann Littler, MD: Patient presented for dysuria. Suspected BPH symptoms. Plan for urology referral.  01/02/21 [name] (PCP) -  seen for right shoulder pain. No medication changes and referral placed to Sports medicine. No follow up noted.    12/17/20 [name] (PCP) -  seen for tendinitis of right rotator cuff and other issues. No medication changes. Administered methylprednisolone 40mg  injection. Follow up in 6 months.   Recent consult visits: 02/18/21 Lynne Leader, MD (sports medicine): Patient presented for shoulder pain follow up. Administered steroid injection.  02/01/21 Newman Pies MD [facility] Neurosurgery) - presented to clinic for Radiculopathy of lumbar regions, hypertension and BMI. Patient stopped Neurontin and follow up not noted.    01/07/21 Lynne Leader MD ( Sports Medicine) - seen for right shoulder pain. Referral placed to physical therapy. No medication changes. Follow up in 6  weeks.  11/14/20 Fransico Him, MD (cardiology): Patient presented for Afib follow up. Patient has been having chronic back pain and plan is for a spinal injection and he needs to be off his anticoagulation for 3 days  Hospital visits: None in previous 6 months   Objective:  Lab Results  Component Value Date   CREATININE 1.23 11/15/2020   BUN 19 11/15/2020   GFR 49.80 (L) 03/16/2019   GFRNONAA 40 (L) 11/03/2019   GFRAA 46 (L) 11/03/2019   NA 141 11/15/2020   K 4.4 11/15/2020   CALCIUM 8.9 11/15/2020   CO2 21 11/15/2020   GLUCOSE 109 (H) 11/15/2020    Lab Results  Component Value Date/Time   HGBA1C 6.1 (A) 01/02/2021 03:45 PM   HGBA1C 5.7 (H) 04/07/2019 07:17 PM   HGBA1C 6.0 10/28/2016 11:25 AM   GFR 49.80 (L) 03/16/2019 09:12 AM   GFR 50.93 (L) 11/03/2017 10:18 AM    Last diabetic Eye exam:  Lab Results  Component Value Date/Time   HMDIABEYEEXA No Retinopathy 12/15/2017 12:00 AM    Last diabetic Foot exam: No results found for: HMDIABFOOTEX   Lab Results  Component Value Date   CHOL 131 11/15/2020   HDL 33 (L) 11/15/2020   LDLCALC 74 11/15/2020   LDLDIRECT 91.0 09/19/2020   TRIG 138 11/15/2020   CHOLHDL 4.0 11/15/2020    Hepatic Function Latest Ref Rng & Units 11/15/2020 09/19/2020 03/27/2020  Total Protein 6.0 - 8.5 g/dL 6.1 6.2 6.6  Albumin 3.6 - 4.6 g/dL 4.2 4.0 -  AST 0 - 40 IU/L 13 13 14   ALT 0 - 44 IU/L 7 13 7(L)  Alk Phosphatase  44 - 121 IU/L 69 72 -  Total Bilirubin 0.0 - 1.2 mg/dL 0.5 0.5 0.5  Bilirubin, Direct 0.0 - 0.3 mg/dL - 0.1 -    Lab Results  Component Value Date/Time   TSH 2.32 03/27/2020 04:46 PM   TSH 3.02 03/16/2019 09:12 AM    CBC Latest Ref Rng & Units 03/27/2020 11/03/2019 04/08/2019  WBC 3.8 - 10.8 Thousand/uL 7.1 8.1 6.7  Hemoglobin 13.2 - 17.1 g/dL 15.4 14.2 13.8  Hematocrit 38.5 - 50.0 % 44.6 42.3 42.0  Platelets 140 - 400 Thousand/uL 222 209 197    No results found for: VD25OH  Clinical ASCVD: No  The ASCVD Risk score Mikey Bussing  DC Jr., et al., 2013) failed to calculate for the following reasons:   The 2013 ASCVD risk score is only valid for ages 24 to 84    Depression screen PHQ 2/9 03/27/2020 11/03/2017 10/28/2016  Decreased Interest 0 0 0  Down, Depressed, Hopeless 0 0 -  PHQ - 2 Score 0 0 0  Altered sleeping 0 - -  Tired, decreased energy 1 - -  Change in appetite 3 - -  Feeling bad or failure about yourself  0 - -  Trouble concentrating 1 - -  Moving slowly or fidgety/restless 0 - -  Suicidal thoughts 0 - -  PHQ-9 Score 5 - -  Difficult doing work/chores Not difficult at all - -     CHA2DS2/VAS Stroke Risk Points  Current as of yesterday     3 >= 2 Points: High Risk  1 - 1.99 Points: Medium Risk  0 Points: Low Risk    Last Change: N/A      Details    This score determines the patient's risk of having a stroke if the  patient has atrial fibrillation.       Points Metrics  0 Has Congestive Heart Failure:  No    Current as of yesterday  0 Has Vascular Disease:  No    Current as of yesterday  1 Has Hypertension:  Yes    Current as of yesterday  2 Age:  68    Current as of yesterday  0 Has Diabetes:  No    Current as of yesterday  0 Had Stroke:  No  Had TIA:  No  Had Thromboembolism:  No    Current as of yesterday  0 Male:  No    Current as of yesterday            Social History   Tobacco Use  Smoking Status Former   Packs/day: 1.00   Years: 30.00   Pack years: 30.00   Types: Cigarettes   Quit date: 03/13/1988   Years since quitting: 33.0  Smokeless Tobacco Current   Types: Chew   BP Readings from Last 3 Encounters:  02/27/21 (!) 142/68  02/18/21 138/78  01/07/21 128/86   Pulse Readings from Last 3 Encounters:  02/27/21 76  02/18/21 71  01/07/21 67   Wt Readings from Last 3 Encounters:  02/27/21 203 lb 14.4 oz (92.5 kg)  02/18/21 206 lb 12.8 oz (93.8 kg)  01/07/21 217 lb (98.4 kg)   BMI Readings from Last 3 Encounters:  02/27/21 26.18 kg/m  02/18/21 26.55 kg/m   01/07/21 27.86 kg/m    Assessment/Interventions: Review of patient past medical history, allergies, medications, health status, including review of consultants reports, laboratory and other test data, was performed as part of comprehensive evaluation and provision of chronic care management services.  SDOH:  (Social Determinants of Health) assessments and interventions performed: {yes/no:20286}  SDOH Screenings   Alcohol Screen: Not on file  Depression (JEH6-3): Not on file  Financial Resource Strain: Low Risk    Difficulty of Paying Living Expenses: Not very hard  Food Insecurity: Not on file  Housing: Not on file  Physical Activity: Not on file  Social Connections: Not on file  Stress: Not on file  Tobacco Use: High Risk   Smoking Tobacco Use: Former   Smokeless Tobacco Use: Current  Transportation Needs: No Transportation Needs   Lack of Transportation (Medical): No   Lack of Transportation (Non-Medical): No    CCM Care Plan  Allergies  Allergen Reactions   Amoxicillin-Pot Clavulanate Diarrhea and Nausea Only   Losartan Other (See Comments) and Nausea And Vomiting    Elevated creatinine levels Elevated creatinine levels    Medications Reviewed Today     Reviewed by Rebecca Eaton, CMA (Certified Medical Assistant) on 02/27/21 at [place] List Status: <None>   Medication Order Taking? Sig Documenting Provider Last Dose Status Informant  acetaminophen (TYLENOL) 500 MG tablet [id number] Yes Take 500 mg by mouth every 6 (six) hours as needed for mild pain. [provider] Taking Active Multiple Informants  amLODipine (NORVASC) 2.5 MG tablet [id number] Yes TAKE 1 TABLET EVERY DAY Burchette, Alinda Sierras, MD Taking Active   clobetasol ointment (TEMOVATE) 0.05 % [id number] Yes Apply 1 application topically 2 (two) times daily as needed (Rash).  [provider] Taking Active Multiple Informants  ELIQUIS 2.5 MG TABS tablet [id number] Yes TAKE 1 TABLET TWICE DAILY  Turner, Eber Hong, MD Taking Active   finasteride (PROSCAR) 5 MG tablet [id number] Yes Take 1 tablet (5 mg total) by mouth daily. [name], MD Taking Active   furosemide (LASIX) 20 MG tablet [id number] Yes Take 1 tablet (20 mg total) by mouth daily as needed for fluid. Kathlen Mody, Cadence H, PA-C Taking Active   gabapentin (NEURONTIN) 100 MG capsule [id number] Yes TAKE 1 CAPSULE IN THE MORNING  AND TAKE 2 CAPSULES AT BEDTIME [name], MD Taking Active   glucosamine-chondroitin 500-400 MG tablet [id number] Yes Take 2 tablets by mouth daily. [provider] Taking Active   lansoprazole (PREVACID) 30 MG capsule [id number] Yes TAKE 1 CAPSULE EVERY DAY AT 12 NOON Turner, Eber Hong, MD Taking Active   lovastatin (MEVACOR) 20 MG tablet [id number] Yes TAKE 1 TABLET (20 MG TOTAL) BY MOUTH AT BEDTIME. [name], MD Taking Active   metoprolol succinate (TOPROL-XL) 25 MG 24 hr tablet [id number] Yes TAKE 1 TABLET EVERY DAY Burchette, Alinda Sierras, MD Taking Active   metroNIDAZOLE (METROCREAM) 0.75 % cream [id number] Yes APPLY TOPICALLY 3 TIMES A  WEEK ON THE DAYS HE SHAVES Burchette, Alinda Sierras, MD Taking Active Multiple Informants  nitroGLYCERIN (NITROSTAT) 0.4 MG SL tablet [id number] Yes Place 1 tablet (0.4 mg total) under the tongue every 5 (five) minutes x 3 doses as needed for chest pain. Eppie Gibson Taking Active   SYMBICORT 160-4.5 MCG/ACT inhaler [id number] Yes TAKE 2 PUFFS BY MOUTH TWICE A DAY Burchette, Alinda Sierras, MD Taking Active   tamsulosin (FLOMAX) 0.4 MG CAPS capsule [id number] Yes TAKE 1 CAPSULE EVERY DAY Burchette, Alinda Sierras, MD Taking Active   vitamin B-12 (CYANOCOBALAMIN) 500 MCG tablet [id number] Yes Take 500 mcg by mouth daily. [provider] Taking Active Multiple Informants  VITAMIN D PO [id number] Yes Take 400 Units by mouth daily.  [provider] Taking  Active Multiple Informants            Patient Active Problem List   Diagnosis Date Noted    Ventricular bigeminy 04/08/2019   Unstable angina ([facility]) 04/07/2019   Right sciatic nerve pain 07/18/2015   Recurrent vertigo 07/18/2015   BPH (benign prostatic hyperplasia) 06/22/2015   CKD (chronic kidney disease) stage 3, GFR 30-59 ml/min (HCC) 12/22/2014   Lumbar stenosis with neurogenic claudication 04/20/2014   Spinal stenosis of lumbar region 12/15/2013   Need for shingles vaccine 08/31/2012   Prediabetes 10/16/2011   CARCINOMA, SKIN, SQUAMOUS CELL 09/11/2009   INSOMNIA, CHRONIC 09/11/2009   PULMONARY NODULE 09/11/2009   ATRIAL FIBRILLATION 04/07/2009   BENIGN PROSTATIC HYPERTROPHY, HX OF 04/07/2009   POLYPECTOMY, HX OF 04/07/2009   Hyperlipidemia 03/13/2009   Essential hypertension 03/13/2009   GERD 03/13/2009   ROSACEA 03/13/2009   ACTINIC KERATOSIS 03/13/2009   SKIN CANCER, HX OF 03/13/2009   COLONIC POLYPS, HX OF 03/13/2009    Immunization History  Administered Date(s) Administered   Fluad Quad(high Dose 65+) 05/03/2019, 05/31/2020   Influenza Split 08/06/2011, 07/01/2012   Influenza Whole 06/06/2009, 05/07/2010   Influenza, High Dose Seasonal PF 07/06/2013, 06/22/2015, 04/25/2016, 08/05/2017, 07/20/2018   Influenza,inj,Quad PF,6+ Mos 05/24/2014   PFIZER(Purple Top)SARS-COV-2 Vaccination 08/30/2019, 09/30/2019   Pneumococcal Conjugate-13 12/22/2014   Pneumococcal Polysaccharide-23 10/16/2011   Td 08/29/2010   Zoster, Live 08/21/2014    Conditions to be addressed/monitored:  Hypertension, Hyperlipidemia, Atrial Fibrillation, GERD, Chronic Kidney Disease, BPH, and Insomnia, Prediabetes  There are no care plans that you recently modified to display for this patient.   Current Barriers:  {pharmacybarriers:24917}  Pharmacist Clinical Goal(s):  Patient will {PHARMACYGOALCHOICES:24921} through collaboration with PharmD and provider.   Interventions: 1:1 collaboration with [name], MD regarding development and update of comprehensive plan of care as  evidenced by provider attestation and co-signature Inter-disciplinary care team collaboration (see longitudinal plan of care) Comprehensive medication review performed; medication list updated in electronic medical record  Hypertension (BP goal {CHL HP UPSTREAM Pharmacist BP ranges:[phone]}) -{US controlled/uncontrolled:25276} -Current treatment: Amlodipine 2.5 mg daily  Furosemide 20 mg daily as needed Metoprolol XL 25 mg daily -Medications previously tried: losartan (n/v and elevated SCr)  -Current home readings: *** -Current dietary habits: *** -Current exercise habits: *** -{ACTIONS;DENIES/REPORTS:21021675::"Denies"} hypotensive/hypertensive symptoms -Educated on {CCM BP Counseling:25124} -Counseled to monitor BP at home ***, document, and provide log at future appointments -{CCMPHARMDINTERVENTION:25122} Patient has low appetite and does not follow any particular dietary regimen. Mainly eats sandwiches for lunch, snacks of peanut butter and crackers, and will have a light dinner with meat. Rarely eats vegetables but will eat fruit.    Patient has occasional dizziness spells but previously when they had checked his blood pressure during these times it was stable. Counseled patient on avoiding rapid postural changes and to stay hydrated throughout the day.    Lab Results  Component Value Date   CHOL 131 11/15/2020   HDL 33 (L) 11/15/2020   LDLCALC 74 11/15/2020   LDLDIRECT 91.0 09/19/2020   TRIG 138 11/15/2020   CHOLHDL 4.0 11/15/2020    Hyperlipidemia: (LDL goal < 100) -Controlled -Current treatment: Lovastatin 20 mg 1 tablet at bedtime -Medications previously tried: none  -Current dietary patterns: *** -Current exercise habits: *** -Educated on {CCM HLD Counseling:25126} -{CCMPHARMDINTERVENTION:25122}  Lab Results  Component Value Date   HGBA1C 6.1 (A) 01/02/2021     Pre-diabetes (A1c goal <6.5%) -Controlled -Current medications: No medications -Medications  previously tried: none  -Current  home glucose readings fasting glucose: *** post prandial glucose: *** -{ACTIONS;DENIES/REPORTS:21021675::"Denies"} hypoglycemic/hyperglycemic symptoms -Current meal patterns:  breakfast: ***  lunch: ***  dinner: *** snacks: *** drinks: *** -Current exercise: *** -Educated on {CCM DM COUNSELING:25123} -Counseled to check feet daily and get yearly eye exams -{CCMPHARMDINTERVENTION:25122}  Atrial Fibrillation (Goal: prevent stroke and major bleeding) -{US controlled/uncontrolled:25276} -CHADSVASC: 3 -Current treatment: Rate control: Apixaban 2.5 mg twice daily Anticoagulation: Metoprolol XL 25 mg daily   -Medications previously tried: *** -Home BP and HR readings: ***  -Counseled on {CCMAFIBCOUNSELING:25120} -{CCMPHARMDINTERVENTION:25122} Heart rate stays in the 60s. Heart rate has felt stable and regular rhythm since patient's last ablation in 2011. Patient bruises with eliquis, but otherwise no signs of unusual bleeding.   Shortness of breath (Goal: ***) -{US controlled/uncontrolled:25276} -Current treatment  Symbicort 160-4.5 mcg/act inhale 2 puffs twice daily -Medications previously tried: ***  -{CCMPHARMDINTERVENTION:25122}   BPH (Goal: ***) -{US controlled/uncontrolled:25276} -Current treatment  Tamsulosin 0.4 mg daily  Finasteride 5 mg daily -Medications previously tried: ***  -{CCMPHARMDINTERVENTION:25122}  GERD (Goal: ***) -{US controlled/uncontrolled:25276} -Current treatment  Lansoprazole 30 mg daily at noon  -Medications previously tried: ***  -{CCMPHARMDINTERVENTION:25122} GERD symptoms well controlled on lansoprazole, but patient still with frequent, uncomfortable flatulence.   Pain/osteoarthritis (Goal: ***) -{US controlled/uncontrolled:25276} -Current treatment  APAP 500 mg every 6 hours as needed  Gabapentin 100 mg at bedtime Glucosamine-Chondroitin 2 tablets twice daily -Medications previously tried: ***   -{CCMPHARMDINTERVENTION:25122} Recommend Tylenol Arthritis CR 650 mg twice daily. Counseled to avoid >3000 mg acetaminophen daily.    Health Maintenance -Vaccine gaps: *** -Current therapy:  Clobetasol 0.05% ointment twice daily PRN  Metronidazole 0.75% cream three times weekly  Nitroglycerin 0.4 mg SL  Vitamin B12 500 mcg daily  Vitamin D 400 units daily -Educated on {ccm supplement counseling:25128} -{CCM Patient satisfied:25129} -{CCMPHARMDINTERVENTION:25122} Recommend increasing vitamin D to 1000 units daily (recommended [id number] units daily)  Patient Goals/Self-Care Activities Patient will:  - {pharmacypatientgoals:24919}  Follow Up Plan: {CM FOLLOW UP JSHF:02637}   Medication Assistance: {MEDASSISTANCEINFO:25044}  Compliance/Adherence/Medication fill history: Care Gaps: Shingrix, COVID booster, tetanus, influenza  Star-Rating Drugs: Lovastatin $RemoveBeforeDEI'20mg'FGWrmoltClPOhvZV$  - last filled on 03/28/21 90DS at [facility].  Patient's preferred pharmacy is:  [id number] [address], [place] [address] Phone: [phone] Fax: [phone]  [facility] ([facility] - [place], [place] [place] [address] Phone: [phone] Fax: [phone]  Uses pill box? {Yes or If no, why not?:20788} Pt endorses ***% compliance  We discussed: {Pharmacy options:24294} Patient decided to: {US Pharmacy ZGYF:74944}  Care Plan and Follow Up Patient Decision:  {FOLLOWUP:24991}  Plan: {CM FOLLOW UP HQPR:91638}  Jeni Salles, PharmD, [place] Pharmacist [facility]

## 2021-04-17 ENCOUNTER — Telehealth: Payer: Self-pay

## 2021-04-17 MED ORDER — BUDESONIDE-FORMOTEROL FUMARATE 160-4.5 MCG/ACT IN AERO: INHALATION_SPRAY | RESPIRATORY_TRACT | 3 refills | Status: DC

## 2021-04-17 NOTE — Addendum Note (Signed)
Addended by: Rebecca Eaton on: 04/17/2021 01:02 PM   Modules accepted: Orders

## 2021-04-17 NOTE — Telephone Encounter (Signed)
Pharmacy called requesting new Rx for [facility] 160-4.5 MCG/ACT inhaler to be sent to the [facility] ([facility]) - [place], [place] Phone:  [phone]  Fax:  [phone]

## 2021-04-17 NOTE — Telephone Encounter (Signed)
Rx has been sent in. 

## 2021-05-13 ENCOUNTER — Ambulatory Visit (INDEPENDENT_AMBULATORY_CARE_PROVIDER_SITE_OTHER): Payer: Medicare PPO | Admitting: Family Medicine

## 2021-05-13 ENCOUNTER — Other Ambulatory Visit: Payer: Self-pay

## 2021-05-13 VITALS — BP 132/60 | HR 64 | Temp 97.9°F | Wt 206.3 lb

## 2021-05-13 DIAGNOSIS — R600 Localized edema: Secondary | ICD-10-CM

## 2021-05-13 DIAGNOSIS — R0600 Dyspnea, unspecified: Secondary | ICD-10-CM

## 2021-05-13 MED ORDER — FUROSEMIDE 40 MG PO TABS: ORAL_TABLET | ORAL | 2 refills | Status: DC

## 2021-05-13 NOTE — Patient Instructions (Signed)
Elevate legs frequently  Take the Furosemide 40 mg once daily for 3 days and then one daily as needed.  Watch the salt intake.

## 2021-05-13 NOTE — Progress Notes (Signed)
Established Patient Office Visit  Subjective:  Patient ID: [name], male    DOB: Jun 14, 1932  Age: 87 y.o. MRN: [id number]  CC:  Chief Complaint  Patient presents with   Edema    Both feet and ankles. X 2 weeks, constant swelling that never goes away    HPI [name] presents for bilateral lower leg, foot, ankle swelling.  Increased over the past couple weeks.  Weight is up 3 pounds from last visit.  Has had some edema in the past but this seems worse.  He feels he has had some slight increased dyspnea with activity.  No chest pains.  Had echocardiogram 8/20 which showed ejection fraction 55 to 60%.  Wife states he adds quite a bit of salt but patient denies significant sodium intake..  No recent nonsteroidal use.  He is on low-dose amlodipine.  Flomax recently increased to 0.8 mg.  He denies any orthopnea.  His chronic problems include past history of atrial fibrillation, hypertension, multiple skin cancers, chronic kidney disease stage III, lumbar stenosis Albumin back in March 4.2.  Has taken as needed low-dose Lasix in the past 20 mg daily but his current medication is out of date.  Past Medical History:  Diagnosis Date   Actinic keratosis 03/13/2009   Arthritis    BENIGN PROSTATIC HYPERTROPHY, HX OF 04/07/2009   CARCINOMA, SKIN, SQUAMOUS CELL 09/11/2009   COLONIC POLYPS, HX OF 03/13/2009   Essential hypertension    GERD 03/13/2009   Hyperlipidemia    INSOMNIA, CHRONIC 09/11/2009   Persistent atrial fibrillation ([place]) 04/07/2009   a. s/p afib ablation at [facility] x 2 in 2010.   POLYPECTOMY, HX OF 04/07/2009   Premature atrial contractions    PULMONARY NODULE 09/11/2009   PVC's (premature ventricular contractions)    Rosacea 03/13/2009   SKIN CANCER, HX OF 03/13/2009    Past Surgical History:  Procedure Laterality Date   ABLATION  2010   x2   COLONOSCOPY W/ BIOPSIES AND POLYPECTOMY     EYE SURGERY Bilateral    cataracts   LUMBAR DISC SURGERY     RUPTURE   LUMBAR  LAMINECTOMY/DECOMPRESSION MICRODISCECTOMY N/A 04/20/2014   Procedure: LUMBAR TWO-THREE, LUMBAR THREE-FOUR, LUMAR FOUR-FIVE LAMINECTOMIES;  Surgeon: Newman Pies, MD;  Location: [facility];  Service: Neurosurgery;  Laterality: N/A;   POLYPECTOMY     SKIN CANCER EXCISION      Family History  Problem Relation Age of Onset   Cancer Mother    Cancer Father 12       COLON   Heart attack Father    Heart disease Father    Heart disease Sister    CAD Sister    Heart disease Brother    CAD Brother    CAD Brother    Heart disease Brother     Social History   Socioeconomic History   Marital status: Married    Spouse name: Not on file   Number of children: Not on file   Years of education: Not on file   Highest education level: Not on file  Occupational History   Not on file  Tobacco Use   Smoking status: Former    Packs/day: 1.00    Years: 30.00    Pack years: 30.00    Types: Cigarettes    Quit date: 03/13/1988    Years since quitting: 33.1   Smokeless tobacco: Current    Types: Chew  Substance and Sexual Activity   Alcohol use: No  Drug use: No   Sexual activity: Not on file  Other Topics Concern   Not on file  Social History Narrative   Not on file   Social Determinants of Health   Financial Resource Strain: Not on file  Food Insecurity: Not on file  Transportation Needs: Not on file  Physical Activity: Not on file  Stress: Not on file  Social Connections: Not on file  Intimate Partner Violence: Not on file    Outpatient Medications Prior to Visit  Medication Sig Dispense Refill   acetaminophen (TYLENOL) 500 MG tablet Take 500 mg by mouth every 6 (six) hours as needed for mild pain.     amLODipine (NORVASC) 2.5 MG tablet TAKE 1 TABLET EVERY DAY 90 tablet 1   budesonide-formoterol (SYMBICORT) 160-4.5 MCG/ACT inhaler TAKE 2 PUFFS BY MOUTH TWICE A DAY 30.6 each 3   clobetasol ointment (TEMOVATE) 0.93 % Apply 1 application topically 2 (two) times daily as  needed (Rash).      ELIQUIS 2.5 MG TABS tablet TAKE 1 TABLET TWICE DAILY 180 tablet 1   finasteride (PROSCAR) 5 MG tablet Take 1 tablet (5 mg total) by mouth daily. 90 tablet 0   glucosamine-chondroitin 500-400 MG tablet Take 2 tablets by mouth daily.     lansoprazole (PREVACID) 30 MG capsule TAKE 1 CAPSULE EVERY DAY AT 12 NOON 90 capsule 3   lovastatin (MEVACOR) 20 MG tablet TAKE 1 TABLET (20 MG TOTAL) BY MOUTH AT BEDTIME. 90 tablet 1   metoprolol succinate (TOPROL-XL) 25 MG 24 hr tablet TAKE 1 TABLET EVERY DAY 90 tablet 1   metroNIDAZOLE (METROCREAM) 0.75 % cream APPLY TOPICALLY 3 TIMES A  WEEK ON THE DAYS HE SHAVES 45 g 1   nitroGLYCERIN (NITROSTAT) 0.4 MG SL tablet Place 1 tablet (0.4 mg total) under the tongue every 5 (five) minutes x 3 doses as needed for chest pain. 25 tablet 0   tamsulosin (FLOMAX) 0.4 MG CAPS capsule TAKE 1 CAPSULE EVERY DAY 90 capsule 3   vitamin B-12 (CYANOCOBALAMIN) 500 MCG tablet Take 500 mcg by mouth daily.     VITAMIN D PO Take 400 Units by mouth daily.      furosemide (LASIX) 20 MG tablet Take 1 tablet (20 mg total) by mouth daily as needed for fluid.     gabapentin (NEURONTIN) 100 MG capsule TAKE 1 CAPSULE IN THE MORNING  AND TAKE 2 CAPSULES AT BEDTIME 90 capsule 1   No facility-administered medications prior to visit.    Allergies  Allergen Reactions   Amoxicillin-Pot Clavulanate Diarrhea and Nausea Only   Losartan Other (See Comments) and Nausea And Vomiting    Elevated creatinine levels Elevated creatinine levels    ROS Review of Systems  Constitutional:  Negative for chills and fever.  Respiratory:  Positive for shortness of breath. Negative for cough and wheezing.   Cardiovascular:  Positive for leg swelling. Negative for chest pain and palpitations.  Neurological:  Negative for dizziness.     Objective:    Physical Exam Vitals reviewed.  Constitutional:      Appearance: Normal appearance.  Cardiovascular:     Rate and Rhythm: Normal  rate and regular rhythm.  Pulmonary:     Effort: Pulmonary effort is normal.     Breath sounds: Normal breath sounds. No wheezing or rales.  Musculoskeletal:     Right lower leg: Edema present.     Left lower leg: Edema present.     Comments: He has 1+ to 2+ pitting  edema lower legs and ankles bilaterally  Neurological:     General: No focal deficit present.     Mental Status: He is alert.    BP 132/60 (BP Location: Left Arm, Patient Position: Sitting, Cuff Size: Normal)   Pulse 64   Temp 97.9 F (36.6 C) (Oral)   Wt 206 lb 4.8 oz (93.6 kg)   SpO2 97%   BMI 26.49 kg/m  Wt Readings from Last 3 Encounters:  05/13/21 206 lb 4.8 oz (93.6 kg)  02/27/21 203 lb 14.4 oz (92.5 kg)  02/18/21 206 lb 12.8 oz (93.8 kg)     Health Maintenance Due  Topic Date Due   Zoster Vaccines- Shingrix (1 of 2) Never done   COVID-19 Vaccine (3 - Pfizer risk series) 10/28/2019   TETANUS/TDAP  08/29/2020   INFLUENZA VACCINE  03/25/2021    There are no preventive care reminders to display for this patient.  Lab Results  Component Value Date   TSH 2.32 03/27/2020   Lab Results  Component Value Date   WBC 7.1 03/27/2020   HGB 15.4 03/27/2020   HCT 44.6 03/27/2020   MCV 97.6 03/27/2020   PLT 222 03/27/2020   Lab Results  Component Value Date   NA 141 11/15/2020   K 4.4 11/15/2020   CO2 21 11/15/2020   GLUCOSE 109 (H) 11/15/2020   BUN 19 11/15/2020   CREATININE 1.23 11/15/2020   BILITOT 0.5 11/15/2020   ALKPHOS 69 11/15/2020   AST 13 11/15/2020   ALT 7 11/15/2020   PROT 6.1 11/15/2020   ALBUMIN 4.2 11/15/2020   CALCIUM 8.9 11/15/2020   ANIONGAP 10 04/07/2019   EGFR 57 (L) 11/15/2020   GFR 49.80 (L) 03/16/2019   Lab Results  Component Value Date   CHOL 131 11/15/2020   Lab Results  Component Value Date   HDL 33 (L) 11/15/2020   Lab Results  Component Value Date   LDLCALC 74 11/15/2020   Lab Results  Component Value Date   TRIG 138 11/15/2020   Lab Results  Component  Value Date   CHOLHDL 4.0 11/15/2020   Lab Results  Component Value Date   HGBA1C 6.1 (A) 01/02/2021      Assessment & Plan:   Progressive bilateral leg foot and ankle edema over the past couple weeks.  He complains of some increased dyspnea though not severe and certainly not noted at rest.  Previous echo 2 years ago ejection fraction 55 to 60%.  Question diastolic dysfunction.  -Check BNP and basic metabolic panel -Elevate legs frequently -Start furosemide 40 mg daily and take daily for 3 days and then as needed -Recommend daily weights -Set up office follow-up in 3 weeks to reassess -Try to keep daily sodium consumption less than 2500 mg -Avoid nonsteroidals   Meds ordered this encounter  Medications   furosemide (LASIX) 40 MG tablet    Sig: Take one tablet daily by mouth as needed for leg edema    Dispense:  30 tablet    Refill:  2    Follow-up: Return in about 3 weeks (around 06/03/2021).    Carolann Littler, MD

## 2021-05-14 LAB — BASIC METABOLIC PANEL
BUN: 18 mg/dL (ref 6–23)
CO2: 24 mEq/L (ref 19–32)
Calcium: 9 mg/dL (ref 8.4–10.5)
Chloride: 107 mEq/L (ref 96–112)
Creatinine, Ser: 1.23 mg/dL (ref 0.40–1.50)
GFR: 52.89 mL/min — ABNORMAL LOW (ref >60.00)
Glucose, Bld: 121 mg/dL — ABNORMAL HIGH (ref 70–99)
Potassium: 4.1 mEq/L (ref 3.5–5.1)
Sodium: 140 mEq/L (ref 135–145)

## 2021-05-14 LAB — BRAIN NATRIURETIC PEPTIDE: Pro B Natriuretic peptide (BNP): 130 pg/mL — ABNORMAL HIGH (ref 0.0–100.0)

## 2021-05-16 ENCOUNTER — Telehealth: Payer: Self-pay | Admitting: Pharmacist

## 2021-05-16 NOTE — Chronic Care Management (AMB) (Signed)
Chronic Care Management Pharmacy Assistant   Name: [name]  MRN: [id number] DOB: 1931/12/22   Reason for Encounter: Disease State/ Hypertension Assessment Call.   Conditions to be addressed/monitored: HTN   Recent office visits:  05/13/21 [name] (PCP) - seen for bilateral leg edema and other issue. Increased furosemide to 40mg  as needed for leg edema from 20mg  as needed. Discontinued gabapentin. Follow up in 3 weeks.   Recent consult visits:  None.  Hospital visits:  None in previous 6 months  Medications: Outpatient Encounter Medications as of 05/16/2021  Medication Sig   acetaminophen (TYLENOL) 500 MG tablet Take 500 mg by mouth every 6 (six) hours as needed for mild pain.   amLODipine (NORVASC) 2.5 MG tablet TAKE 1 TABLET EVERY DAY   budesonide-formoterol (SYMBICORT) 160-4.5 MCG/ACT inhaler TAKE 2 PUFFS BY MOUTH TWICE A DAY   clobetasol ointment (TEMOVATE) 3.23 % Apply 1 application topically 2 (two) times daily as needed (Rash).    ELIQUIS 2.5 MG TABS tablet TAKE 1 TABLET TWICE DAILY   finasteride (PROSCAR) 5 MG tablet Take 1 tablet (5 mg total) by mouth daily.   furosemide (LASIX) 40 MG tablet Take one tablet daily by mouth as needed for leg edema   glucosamine-chondroitin 500-400 MG tablet Take 2 tablets by mouth daily.   lansoprazole (PREVACID) 30 MG capsule TAKE 1 CAPSULE EVERY DAY AT 12 NOON   lovastatin (MEVACOR) 20 MG tablet TAKE 1 TABLET (20 MG TOTAL) BY MOUTH AT BEDTIME.   metoprolol succinate (TOPROL-XL) 25 MG 24 hr tablet TAKE 1 TABLET EVERY DAY   metroNIDAZOLE (METROCREAM) 0.75 % cream APPLY TOPICALLY 3 TIMES A  WEEK ON THE DAYS HE SHAVES   nitroGLYCERIN (NITROSTAT) 0.4 MG SL tablet Place 1 tablet (0.4 mg total) under the tongue every 5 (five) minutes x 3 doses as needed for chest pain.   tamsulosin (FLOMAX) 0.4 MG CAPS capsule TAKE 1 CAPSULE EVERY DAY   vitamin B-12 (CYANOCOBALAMIN) 500 MCG tablet Take 500 mcg by mouth daily.   VITAMIN D  PO Take 400 Units by mouth daily.    No facility-administered encounter medications on file as of 05/16/2021.   Fill History: AMLODIPINE BESYLATE 2.5 MG TAB 07/11/2020 90   ELIQUIS 2.5 MG TABLET 06/22/2020 90   SYMBICORT 160-4.5 MCG INHALER 06/14/2020 90   FUROSEMIDE  20 MG TABS 12/01/2018 90   GABAPENTIN 100 MG CAPSULE 06/15/2020 90   LANSOPRAZOLE DR 30 MG CAPSULE 06/15/2020 90   LOVASTATIN 20 MG TABLET 06/15/2020 90   METOPROLOL SUCC ER 25 MG TAB 06/15/2020 90   NITROGLYCERIN  0.4 MG SUBL 04/08/2019 10   TAMSULOSIN HCL 0.4 MG CAPSULE 06/06/2020 90   AMLODIPINE BESYLATE 2.5 MG TAB 07/11/2020 90   Reviewed chart prior to disease state call. Spoke with patient regarding BP  Recent Office Vitals: BP Readings from Last 3 Encounters:  05/13/21 132/60  02/27/21 (!) 142/68  02/18/21 138/78   Pulse Readings from Last 3 Encounters:  05/13/21 64  02/27/21 76  02/18/21 71    Wt Readings from Last 3 Encounters:  05/13/21 206 lb 4.8 oz (93.6 kg)  02/27/21 203 lb 14.4 oz (92.5 kg)  02/18/21 206 lb 12.8 oz (93.8 kg)     Kidney Function Lab Results  Component Value Date/Time   CREATININE 1.23 05/13/2021 03:32 PM   CREATININE 1.23 11/15/2020 09:18 AM   CREATININE 1.53 (H) 03/27/2020 04:46 PM   GFR 52.89 (L) 05/13/2021 03:32 PM   GFRNONAA  40 (L) 11/03/2019 10:15 AM   GFRAA 46 (L) 11/03/2019 10:15 AM    BMP Latest Ref Rng & Units 05/13/2021 11/15/2020 03/27/2020  Glucose 70 - 99 mg/dL 121(H) 109(H) 128(H)  BUN 6 - 23 mg/dL 18 19 22   Creatinine 0.40 - 1.50 mg/dL 1.23 1.23 1.53(H)  BUN/Creat Ratio 10 - 24 - 15 14  Sodium 135 - 145 mEq/L 140 141 138  Potassium 3.5 - 5.1 mEq/L 4.1 4.4 4.4  Chloride 96 - 112 mEq/L 107 104 106  CO2 19 - 32 mEq/L 24 21 23   Calcium 8.4 - 10.5 mg/dL 9.0 8.9 9.0    Current antihypertensive regimen:  Amlodipine 2.5mg  - take 1 tablet every day.  Metoprolol 25mg  - take 1 tablet by mouth everyday.  How often are you checking your Blood Pressure?  weekly Current home BP readings: 134/70 on 05/21/21. What recent interventions/DTPs have been made by any provider to improve Blood Pressure control since last CPP Visit: None. Any recent hospitalizations or ED visits since last visit with CPP? No  Adherence Review: Is the patient currently on ACE/ARB medication? No Does the patient have >5 day gap between last estimated fill dates? No  Notes: Spoke with [name] at [facility] who states the patient last filled lovastatin on 03/28/21 90 day supply.  Spoke with patients wife [name] and reviewed all of patients medications. [name] reports changes and no issues with patients medications. Only thing is that patients furosemide and flomax have both been increased due to some leg edema. Patient has been limiting sodium intake and drinking plenty pf water. Patient is limited on his activity due to his back and leg swelling. Patient was scheduled for a follow up with Jeni Salles over the phone.  Care Gaps:  AWV - message sent to Ramond Craver CMA to schedule. Zoster vaccines - never done Covid - 19 vaccine booster - overdue since 10/28/19 Tetanus/TDAP - overdue since 08/29/20 Flu vaccine  - due Last BP with PCP: 132/60 P: 64  Star Rating Drugs:  Lovastatin 20mg  - last filled on 06/15/20 90DS at [facility].  [place]  Clinical Pharmacist Assistant [phone]

## 2021-05-21 DIAGNOSIS — N401 Enlarged prostate with lower urinary tract symptoms: Secondary | ICD-10-CM | POA: Diagnosis not present

## 2021-05-21 DIAGNOSIS — R351 Nocturia: Secondary | ICD-10-CM | POA: Diagnosis not present

## 2021-05-21 DIAGNOSIS — R3912 Poor urinary stream: Secondary | ICD-10-CM | POA: Diagnosis not present

## 2021-05-21 NOTE — Telephone Encounter (Cosign Needed)
2nd attempt

## 2021-05-28 DIAGNOSIS — H26491 Other secondary cataract, right eye: Secondary | ICD-10-CM | POA: Diagnosis not present

## 2021-05-28 DIAGNOSIS — H35363 Drusen (degenerative) of macula, bilateral: Secondary | ICD-10-CM | POA: Diagnosis not present

## 2021-05-28 DIAGNOSIS — Z961 Presence of intraocular lens: Secondary | ICD-10-CM | POA: Diagnosis not present

## 2021-05-28 DIAGNOSIS — H40013 Open angle with borderline findings, low risk, bilateral: Secondary | ICD-10-CM | POA: Diagnosis not present

## 2021-05-28 DIAGNOSIS — H524 Presbyopia: Secondary | ICD-10-CM | POA: Diagnosis not present

## 2021-05-29 ENCOUNTER — Encounter: Payer: Self-pay | Admitting: Family Medicine

## 2021-05-31 ENCOUNTER — Ambulatory Visit (INDEPENDENT_AMBULATORY_CARE_PROVIDER_SITE_OTHER): Payer: Medicare PPO | Admitting: Family Medicine

## 2021-05-31 ENCOUNTER — Other Ambulatory Visit: Payer: Self-pay

## 2021-05-31 VITALS — BP 120/62 | HR 73 | Temp 97.8°F | Wt 202.5 lb

## 2021-05-31 DIAGNOSIS — R600 Localized edema: Secondary | ICD-10-CM

## 2021-05-31 DIAGNOSIS — Z23 Encounter for immunization: Secondary | ICD-10-CM

## 2021-05-31 LAB — BASIC METABOLIC PANEL
BUN: 21 mg/dL (ref 6–23)
CO2: 25 mEq/L (ref 19–32)
Calcium: 9.3 mg/dL (ref 8.4–10.5)
Chloride: 105 mEq/L (ref 96–112)
Creatinine, Ser: 1.2 mg/dL (ref 0.40–1.50)
GFR: 54.46 mL/min — ABNORMAL LOW (ref >60.00)
Glucose, Bld: 107 mg/dL — ABNORMAL HIGH (ref 70–99)
Potassium: 4.3 mEq/L (ref 3.5–5.1)
Sodium: 139 mEq/L (ref 135–145)

## 2021-05-31 MED ORDER — POTASSIUM CHLORIDE ER 10 MEQ PO TBCR: EXTENDED_RELEASE_TABLET | ORAL | 5 refills | Status: DC

## 2021-05-31 NOTE — Progress Notes (Signed)
Established Patient Office Visit  Subjective:  Patient ID: [name], male    DOB: 08-30-1931  Age: 87 y.o. MRN: [id number]  CC:  Chief Complaint  Patient presents with   Follow-up    HPI [name] presents for follow-up regarding bilateral leg edema.  Refer to previous note for details.  Recent BNP level 130.  Renal function stable.  He has been taking furosemide 40 mg daily most days.  He is skipped a couple days recently because of other appointments.  His peripheral edema is some improved.  Weight is down about 4 pounds.  Recent albumin normal.  No orthopnea.  Occasional dyspnea with exertion but inconsistent.  No chest pains.  Trying to watch sodium intake.  Does have some BPH and recently increased dose of Flomax per urology.  He is urinating without difficulty at this time.  Past Medical History:  Diagnosis Date   Actinic keratosis 03/13/2009   Arthritis    BENIGN PROSTATIC HYPERTROPHY, HX OF 04/07/2009   CARCINOMA, SKIN, SQUAMOUS CELL 09/11/2009   COLONIC POLYPS, HX OF 03/13/2009   Essential hypertension    GERD 03/13/2009   Hyperlipidemia    INSOMNIA, CHRONIC 09/11/2009   Persistent atrial fibrillation ([facility]) 04/07/2009   a. s/p afib ablation at [facility] x 2 in 2010.   POLYPECTOMY, HX OF 04/07/2009   Premature atrial contractions    PULMONARY NODULE 09/11/2009   PVC's (premature ventricular contractions)    Rosacea 03/13/2009   SKIN CANCER, HX OF 03/13/2009    Past Surgical History:  Procedure Laterality Date   ABLATION  2010   x2   COLONOSCOPY W/ BIOPSIES AND POLYPECTOMY     EYE SURGERY Bilateral    cataracts   LUMBAR DISC SURGERY     RUPTURE   LUMBAR LAMINECTOMY/DECOMPRESSION MICRODISCECTOMY N/A 04/20/2014   Procedure: LUMBAR TWO-THREE, LUMBAR THREE-FOUR, LUMAR FOUR-FIVE LAMINECTOMIES;  Surgeon: Tressie Stalker, MD;  Location: [facility];  Service: Neurosurgery;  Laterality: N/A;   POLYPECTOMY     SKIN CANCER EXCISION      Family History  Problem  Relation Age of Onset   Cancer Mother    Cancer Father 52       COLON   Heart attack Father    Heart disease Father    Heart disease Sister    CAD Sister    Heart disease Brother    CAD Brother    CAD Brother    Heart disease Brother     Social History   Socioeconomic History   Marital status: Married    Spouse name: Not on file   Number of children: Not on file   Years of education: Not on file   Highest education level: Not on file  Occupational History   Not on file  Tobacco Use   Smoking status: Former    Packs/day: 1.00    Years: 30.00    Pack years: 30.00    Types: Cigarettes    Quit date: 03/13/1988    Years since quitting: 33.2   Smokeless tobacco: Current    Types: Chew  Substance and Sexual Activity   Alcohol use: No   Drug use: No   Sexual activity: Not on file  Other Topics Concern   Not on file  Social History Narrative   Not on file   Social Determinants of Health   Financial Resource Strain: Not on file  Food Insecurity: Not on file  Transportation Needs: Not on file  Physical Activity:  Not on file  Stress: Not on file  Social Connections: Not on file  Intimate Partner Violence: Not on file    Outpatient Medications Prior to Visit  Medication Sig Dispense Refill   acetaminophen (TYLENOL) 500 MG tablet Take 500 mg by mouth every 6 (six) hours as needed for mild pain.     amLODipine (NORVASC) 2.5 MG tablet TAKE 1 TABLET EVERY DAY 90 tablet 1   budesonide-formoterol (SYMBICORT) 160-4.5 MCG/ACT inhaler TAKE 2 PUFFS BY MOUTH TWICE A DAY 30.6 each 3   clobetasol ointment (TEMOVATE) 1.24 % Apply 1 application topically 2 (two) times daily as needed (Rash).      ELIQUIS 2.5 MG TABS tablet TAKE 1 TABLET TWICE DAILY 180 tablet 1   finasteride (PROSCAR) 5 MG tablet Take 1 tablet (5 mg total) by mouth daily. 90 tablet 0   furosemide (LASIX) 40 MG tablet Take one tablet daily by mouth as needed for leg edema 30 tablet 2   glucosamine-chondroitin 500-400  MG tablet Take 2 tablets by mouth daily.     lansoprazole (PREVACID) 30 MG capsule TAKE 1 CAPSULE EVERY DAY AT 12 NOON 90 capsule 3   lovastatin (MEVACOR) 20 MG tablet TAKE 1 TABLET (20 MG TOTAL) BY MOUTH AT BEDTIME. 90 tablet 1   metoprolol succinate (TOPROL-XL) 25 MG 24 hr tablet TAKE 1 TABLET EVERY DAY 90 tablet 1   metroNIDAZOLE (METROCREAM) 0.75 % cream APPLY TOPICALLY 3 TIMES A  WEEK ON THE DAYS HE SHAVES 45 g 1   nitroGLYCERIN (NITROSTAT) 0.4 MG SL tablet Place 1 tablet (0.4 mg total) under the tongue every 5 (five) minutes x 3 doses as needed for chest pain. 25 tablet 0   tamsulosin (FLOMAX) 0.4 MG CAPS capsule TAKE 1 CAPSULE EVERY DAY 90 capsule 3   vitamin B-12 (CYANOCOBALAMIN) 500 MCG tablet Take 500 mcg by mouth daily.     VITAMIN D PO Take 400 Units by mouth daily.      No facility-administered medications prior to visit.    Allergies  Allergen Reactions   Amoxicillin-Pot Clavulanate Diarrhea and Nausea Only   Losartan Other (See Comments) and Nausea And Vomiting    Elevated creatinine levels Elevated creatinine levels    ROS Review of Systems  Constitutional:  Negative for chills and fever.  Respiratory:  Negative for shortness of breath.   Cardiovascular:  Positive for leg swelling. Negative for chest pain.  Neurological:  Negative for dizziness.     Objective:    Physical Exam Vitals reviewed.  Constitutional:      Appearance: He is well-developed.  HENT:     Right Ear: External ear normal.     Left Ear: External ear normal.  Eyes:     Pupils: Pupils are equal, round, and reactive to light.  Neck:     Thyroid: No thyromegaly.  Cardiovascular:     Rate and Rhythm: Normal rate and regular rhythm.  Pulmonary:     Effort: Pulmonary effort is normal. No respiratory distress.     Breath sounds: Normal breath sounds. No wheezing or rales.  Musculoskeletal:     Cervical back: Neck supple.     Comments: Trace pitting edema lower legs and ankles bilaterally.  No  significant edema mid leg up  Neurological:     Mental Status: He is alert and oriented to person, place, and time.    BP 120/62 (BP Location: Left Arm, Patient Position: Sitting, Cuff Size: Normal)   Pulse 73   Temp 97.8  F (36.6 C) (Oral)   Wt 202 lb 8 oz (91.9 kg)   SpO2 98%   BMI 26.00 kg/m  Wt Readings from Last 3 Encounters:  05/31/21 202 lb 8 oz (91.9 kg)  05/13/21 206 lb 4.8 oz (93.6 kg)  02/27/21 203 lb 14.4 oz (92.5 kg)     Health Maintenance Due  Topic Date Due   Zoster Vaccines- Shingrix (1 of 2) Never done   COVID-19 Vaccine (3 - Pfizer risk series) 10/28/2019   TETANUS/TDAP  08/29/2020   INFLUENZA VACCINE  03/25/2021    There are no preventive care reminders to display for this patient.  Lab Results  Component Value Date   TSH 2.32 03/27/2020   Lab Results  Component Value Date   WBC 7.1 03/27/2020   HGB 15.4 03/27/2020   HCT 44.6 03/27/2020   MCV 97.6 03/27/2020   PLT 222 03/27/2020   Lab Results  Component Value Date   NA 140 05/13/2021   K 4.1 05/13/2021   CO2 24 05/13/2021   GLUCOSE 121 (H) 05/13/2021   BUN 18 05/13/2021   CREATININE 1.23 05/13/2021   BILITOT 0.5 11/15/2020   ALKPHOS 69 11/15/2020   AST 13 11/15/2020   ALT 7 11/15/2020   PROT 6.1 11/15/2020   ALBUMIN 4.2 11/15/2020   CALCIUM 9.0 05/13/2021   ANIONGAP 10 04/07/2019   EGFR 57 (L) 11/15/2020   GFR 52.89 (L) 05/13/2021   Lab Results  Component Value Date   CHOL 131 11/15/2020   Lab Results  Component Value Date   HDL 33 (L) 11/15/2020   Lab Results  Component Value Date   LDLCALC 74 11/15/2020   Lab Results  Component Value Date   TRIG 138 11/15/2020   Lab Results  Component Value Date   CHOLHDL 4.0 11/15/2020   Lab Results  Component Value Date   HGBA1C 6.1 (A) 01/02/2021      Assessment & Plan:   Problem List Items Addressed This Visit   None Visit Diagnoses     Bilateral leg edema    -  Primary   Relevant Orders   Basic metabolic panel      Bilateral leg edema improved.  Suspect diastolic dysfunction predominantly.  He has lost about 4 pounds since starting furosemide.  Recheck basic metabolic panel today.  Elevate legs frequently.  Start potassium with Klor 10 mill equivalents-2 tablets daily on the days he takes the furosemide.  Flu vaccine given  Meds ordered this encounter  Medications   potassium chloride (KLOR-CON 10) 10 MEQ tablet    Sig: Take two tablets by mouth once daily    Dispense:  60 tablet    Refill:  5    Follow-up: No follow-ups on file.    Carolann Littler, MD

## 2021-05-31 NOTE — Addendum Note (Signed)
Addended by: Amanda Cockayne on: 05/31/2021 02:03 PM   Modules accepted: Orders

## 2021-06-06 ENCOUNTER — Other Ambulatory Visit: Payer: Self-pay | Admitting: Family Medicine

## 2021-06-08 ENCOUNTER — Other Ambulatory Visit: Payer: Self-pay | Admitting: Family Medicine

## 2021-06-25 ENCOUNTER — Other Ambulatory Visit: Payer: Self-pay | Admitting: Cardiology

## 2021-06-25 DIAGNOSIS — I480 Paroxysmal atrial fibrillation: Secondary | ICD-10-CM

## 2021-06-25 MED ORDER — APIXABAN 5 MG PO TABS: 5.0000 mg | ORAL_TABLET | Freq: Two times a day (BID) | ORAL | 1 refills | Status: DC

## 2021-06-25 NOTE — Telephone Encounter (Signed)
Called spoke with pt's wife who helps pt with his medication.  Advised per Dr Radford Pax given pt's recent kidney function improvement pt's dosage of Eliquis needs to be increased to 5mg  BID.  Wife aware of dosage change.

## 2021-06-25 NOTE — Telephone Encounter (Signed)
Pt last saw Dr Radford Pax 11/14/20, last labs 05/31/21 Creat 1.2, age 87, weight 91.9kg, based on specified criteria pt is not on appropriate dosage of Eliquis.  Pt's Creat has been elevated >1.5 in the past, but last 8 months Creat has been <1.5.  11/15/20 Creat 1.23, 05/13/21 Creat 1.23, 05/31/21 Creat 1.20.  Will send message to Dr Radford Pax to see if Eliquis dosage adjustment is appropriate for this pt.  Will await her response to refill. Please advise if Eliquis dosage should be increased to 5mg  BID, pt currently taking 2.5mg  BID. Thanks

## 2021-06-25 NOTE — Telephone Encounter (Signed)
-----   Message from Sueanne Margarita, MD sent at 06/25/2021  3:31 PM EDT ----- Agree he should be on Eliquis 5 mg twice daily ----- Message ----- From: Brynda Peon, RN Sent: 06/25/2021  10:34 AM EDT To: Sueanne Margarita, MD  Pt last saw Dr Radford Pax 11/14/20, last labs 05/31/21 Creat 1.2, age 87, weight 91.9kg, based on specified criteria pt is not on appropriate dosage of Eliquis.  Pt's Creat has been elevated >1.5 in the past, but last 8 months Creat has been <1.5.  11/15/20 Creat 1.23, 05/13/21 Creat 1.23, 05/31/21 Creat 1.20.  Will send message to Dr Radford Pax to see if Eliquis dosage adjustment is appropriate for this pt.  Will await her response to refill. Please advise if Eliquis dosage should be increased to 5mg  BID, pt currently taking 2.5mg  BID. Thanks

## 2021-07-12 DIAGNOSIS — L814 Other melanin hyperpigmentation: Secondary | ICD-10-CM | POA: Diagnosis not present

## 2021-07-12 DIAGNOSIS — D1801 Hemangioma of skin and subcutaneous tissue: Secondary | ICD-10-CM | POA: Diagnosis not present

## 2021-07-12 DIAGNOSIS — L821 Other seborrheic keratosis: Secondary | ICD-10-CM | POA: Diagnosis not present

## 2021-07-12 DIAGNOSIS — L578 Other skin changes due to chronic exposure to nonionizing radiation: Secondary | ICD-10-CM | POA: Diagnosis not present

## 2021-07-12 DIAGNOSIS — L570 Actinic keratosis: Secondary | ICD-10-CM | POA: Diagnosis not present

## 2021-08-06 ENCOUNTER — Other Ambulatory Visit: Payer: Self-pay | Admitting: Family Medicine

## 2021-09-04 ENCOUNTER — Other Ambulatory Visit: Payer: Self-pay

## 2021-09-04 ENCOUNTER — Other Ambulatory Visit: Payer: Self-pay | Admitting: *Deleted

## 2021-09-04 MED ORDER — APIXABAN 5 MG PO TABS: 5.0000 mg | ORAL_TABLET | Freq: Two times a day (BID) | ORAL | 1 refills | Status: DC

## 2021-09-04 MED ORDER — LANSOPRAZOLE 30 MG PO CPDR: DELAYED_RELEASE_CAPSULE | ORAL | 0 refills | Status: DC

## 2021-09-04 NOTE — Telephone Encounter (Signed)
Prescription refill request for Eliquis received. Indication: afib  Last office visit: Turner 11/14/2020 Scr: 1.20, 05/31/2021 Age: 88 yo  Weight: 91.9 kg   [facility]   [facility] Request [facility]mg  [facility]. Called and spoke to pt's wife and confirmed that pt is taking Eliquis 5mg  BID.

## 2021-09-06 ENCOUNTER — Telehealth: Payer: Self-pay | Admitting: Family Medicine

## 2021-09-06 ENCOUNTER — Telehealth: Payer: Self-pay | Admitting: Pharmacist

## 2021-09-06 MED ORDER — FINASTERIDE 5 MG PO TABS: 5.0000 mg | ORAL_TABLET | Freq: Every day | ORAL | 0 refills | Status: DC

## 2021-09-06 MED ORDER — TAMSULOSIN HCL 0.4 MG PO CAPS: 0.4000 mg | ORAL_CAPSULE | Freq: Every day | ORAL | 3 refills | Status: AC

## 2021-09-06 MED ORDER — METOPROLOL SUCCINATE ER 25 MG PO TB24: 25.0000 mg | ORAL_TABLET | Freq: Every day | ORAL | 1 refills | Status: DC

## 2021-09-06 MED ORDER — POTASSIUM CHLORIDE ER 10 MEQ PO TBCR: EXTENDED_RELEASE_TABLET | ORAL | 5 refills | Status: DC

## 2021-09-06 MED ORDER — LOVASTATIN 20 MG PO TABS: 20.0000 mg | ORAL_TABLET | Freq: Every day | ORAL | 1 refills | Status: DC

## 2021-09-06 MED ORDER — BUDESONIDE-FORMOTEROL FUMARATE 160-4.5 MCG/ACT IN AERO: INHALATION_SPRAY | RESPIRATORY_TRACT | 3 refills | Status: DC

## 2021-09-06 MED ORDER — AMLODIPINE BESYLATE 2.5 MG PO TABS: 2.5000 mg | ORAL_TABLET | Freq: Every day | ORAL | 1 refills | Status: DC

## 2021-09-06 MED ORDER — FUROSEMIDE 40 MG PO TABS: ORAL_TABLET | ORAL | 2 refills | Status: DC

## 2021-09-06 NOTE — Telephone Encounter (Signed)
Patient's wife called because their pharmacy has changed and prescriptions need to be sent to new pharmacy.  amLODipine (NORVASC) 2.5 MG tablet  budesonide-formoterol (SYMBICORT) 160-4.5 MCG/ACT inhaler  finasteride (PROSCAR) 5 MG tablet  furosemide (LASIX) 40 MG tablet  lovastatin (MEVACOR) 20 MG tablet  metoprolol succinate (TOPROL-XL) 25 MG 24 hr tablet  potassium chloride (KLOR-CON 10) 10 MEQ tablet  tamsulosin (FLOMAX) 0.4 MG CAPS capsule  And any other prescriptions  Please send to  [place], [place] Phone:  [phone]  Fax:  [phone]     Please advise

## 2021-09-06 NOTE — Chronic Care Management (AMB) (Signed)
° ° °  Chronic Care Management Pharmacy Assistant   Name: DALLAS TOROK  MRN: [id number] DOB: 08/31/1931  09/11/2021 APPOINTMENT [place] wife was reminded to have all medications, supplements and any blood glucose and blood pressure readings available for review with Jeni Salles, Pharm. D, at his telephone visit on 09/11/2021 at 3:45.   Questions: Have you had any recent office visit or specialist visit outside of [facility]? Patients wife denies any visits outside of [facility]  Are there any concerns you would like to discuss during your office visit? Patients wife denies any concerns at this time.   Are you having any problems obtaining your medications? (Whether it pharmacy issues or cost) Patients wife denies any issues obtaining medications.  She does state due to insurance they are in the process of [facility] and Dr. Elease Hashimoto is to be sending all new script to them.   If patient has any PAP medications ask if they are having any problems getting their PAP medication or refill? Patients wife denies any medications filled by PAP  Care Gaps: AWV - msg sent to Ramond Craver Last BP - 120/62 on 05/31/2021 Last A1C - 6.1 on 01/02/2021 Shingrix - never done Covid vaccine - overdue TDAP - overdue  Star Rating Drug: Lovastatin 20mg  - last filled on 06/15/20 90DS at [facility]. All medications are in process of changing [facility].   Any gaps in medications fill history? Patients wife states no gap and medications are all in process of changing to [facility].  [place] Pharmacist Assistant [phone]

## 2021-09-06 NOTE — Telephone Encounter (Signed)
Prescriptions Have been sent in

## 2021-09-11 ENCOUNTER — Ambulatory Visit (INDEPENDENT_AMBULATORY_CARE_PROVIDER_SITE_OTHER): Payer: Medicare (Managed Care) | Admitting: Pharmacist

## 2021-09-11 DIAGNOSIS — R3914 Feeling of incomplete bladder emptying: Secondary | ICD-10-CM

## 2021-09-11 DIAGNOSIS — I10 Essential (primary) hypertension: Secondary | ICD-10-CM

## 2021-09-11 DIAGNOSIS — N401 Enlarged prostate with lower urinary tract symptoms: Secondary | ICD-10-CM

## 2021-09-11 NOTE — Patient Instructions (Signed)
[place],   It was great to get to meet you over the telephone! Below is a summary of some of the topics we discussed.   Please reach out to me if you have any questions or need anything!  Best, Maddie  Jeni Salles, PharmD, [place] [place]   Visit Information   Goals Addressed   None    Patient Care Plan: CCM Pharmacy Care Plan     Problem Identified: Problem: Hypertension, Hyperlipidemia, Atrial Fibrillation, GERD, Chronic Kidney Disease, and BPH      Long-Range Goal: Patient-Specific Goal   Start Date: 09/11/2021  Expected End Date: 09/11/2022  This Visit's Progress: On track  Priority: High  Note:   Current Barriers:  Unable to independently afford treatment regimen Unable to independently monitor therapeutic efficacy  Pharmacist Clinical Goal(s):  Patient will verbalize ability to afford treatment regimen achieve adherence to monitoring guidelines and medication adherence to achieve therapeutic efficacy through collaboration with PharmD and provider.   Interventions: 1:1 collaboration with [name], MD regarding development and update of comprehensive plan of care as evidenced by provider attestation and co-signature Inter-disciplinary care team collaboration (see longitudinal plan of care) Comprehensive medication review performed; medication list updated in electronic medical record  Hypertension (BP goal <140/90) -Controlled -Current treatment: Metoprolol succinate 25 mg 1 tablet daily - Appropriate, Effective, Safe, Accessible Amlodipine 2.5 mg 1 tablet daily - Appropriate, Effective, Safe, Accessible -Medications previously tried: unknown  -Current home readings: unsure of recent readings; wife checks sometimes -Current dietary habits: did not discuss -Current exercise habits: walking sometimes but limited with back pain -Reports hypotensive/hypertensive symptoms -Educated on  BP goals and benefits of medications for prevention of heart attack, stroke and kidney damage; Importance of home blood pressure monitoring; Proper BP monitoring technique; Symptoms of hypotension and importance of maintaining adequate hydration; -Counseled to monitor BP at home weekly, document, and provide log at future appointments -Recommended moving metoprolol to bedtime with other medications to see if this improves dizziness.   Hyperlipidemia: (LDL goal < 100) -Controlled -Current treatment: Lovastatin 20 mg 1 tablet daily at bedtime - Appropriate, Effective, Safe, Accessible -Medications previously tried: none  -Current dietary patterns: did not discuss -Current exercise habits: limited with back pain -Educated on Cholesterol goals;  -Recommended to continue current medication  Atrial Fibrillation (Goal: prevent stroke and major bleeding) -Controlled -CHADSVASC: 3 -Current treatment: Rate control: Metoprolol succinate 25 mg 1 tablet daily - Appropriate, Effective, Safe, Accessible Anticoagulation: Eliquis 5 mg 1 tablet twice daily - Appropriate, Effective, Safe, Accessible -Medications previously tried: none -Home BP and HR readings: refer to above  -Counseled on increased risk of stroke due to Afib and benefits of anticoagulation for stroke prevention; -Recommended to continue current medication Counseled on importance of taking Eliquis doses roughly 12 hours apart.  Query COPD (Goal: control symptoms and prevent exacerbations) -Controlled -Current treatment  Symbicort 160-4.5 mcg/act  2 puffs twice daily - Query Appropriate, Effective, Safe, Accessible -Medications previously tried: none  -Gold Grade: unknown -Current COPD Classification:  A (low sx, <2 exacerbations/yr) -MMRC/CAT score: n/a -Pulmonary function testing: none -Exacerbations requiring treatment in last 6 months: none -Patient reports consistent use of maintenance inhaler -Frequency of rescue inhaler  use: does not have one -Counseled on Proper inhaler technique; -Recommended spirometry testing to ensure appropriate inhaler therapy. Assessed patient finances. Applied for patient assistance for Symbicort and patient was approved.  BPH (Goal: minimize symptoms) -Controlled -Current treatment  Tamsulosin 0.4  mg 1 capsule twice daily - Appropriate, Query effective, Query Safe, Accessible Finasteride 5 mg 1 tablet daily - Appropriate, Effective, Safe, Accessible -Medications previously tried: none  -Recommended taking tamsulosin with food to minimize dizziness.  GERD (Goal: minimize symptoms) -Controlled -Current treatment  Lansoprazole 30 mg daily at noon - Appropriate, Effective, Query Safe, Accessible -Medications previously tried: none  - Reassess the need for this medication at follow up.   Health Maintenance -Vaccine gaps: tetanus, COVID booster -Current therapy:  APAP 500 mg as needed Glucosamine-Chondroitin 2 tablets twice daily Clobetasol 0.05% ointment twice daily PRN  Metronidazole 0.75% cream three times weekly  Nitroglycerin 0.4 mg SL  Vitamin B12 500 mcg daily  Vitamin D 400 units daily Potassium chloride daily -Educated on Cost vs benefit of each product must be carefully weighed by individual consumer -Patient is satisfied with current therapy and denies issues -Recommended to continue current medication  Patient Goals/Self-Care Activities Patient will:  - take medications as prescribed as evidenced by patient report and record review check blood pressure weekly and if symptomatic, document, and provide at future appointments  Follow Up Plan: The care management team will reach out to the patient again over the next 30 days.        Patient verbalizes understanding of instructions and care plan provided today and agrees to view in [place]. Active MyChart status confirmed with patient.   The pharmacy team will reach out to the patient again over the next 30  days.   Viona Gilmore, [facility]

## 2021-09-11 NOTE — Progress Notes (Deleted)
Chronic Care Management Pharmacy Note  09/11/2021 Name:  [name] MRN:  [id number] DOB:  Mar 12, 1932  Summary: ***  Recommendations/Changes made from today's visit: ***  Plan: ***   Subjective: [name] is an 88 y.o. year old male who is a primary patient of Burchette, Alinda Sierras, MD.  The CCM team was consulted for assistance with disease management and care coordination needs.    Engaged with patient by telephone for follow up visit in response to provider referral for pharmacy case management and/or care coordination services.   Consent to Services:  The patient was given information about Chronic Care Management services, agreed to services, and gave verbal consent prior to initiation of services.  Please see initial visit note for detailed documentation.   Patient Care Team: [name], MD as PCP - General Sueanne Margarita, MD as PCP - Cardiology (Cardiology) Germaine Pomfret, [facility] as Pharmacist (Pharmacist)  Recent office visits: ***  Recent consult visits: [facility] visits: {Hospital DC Yes/No:25215}   Objective:  Lab Results  Component Value Date   CREATININE 1.20 05/31/2021   BUN 21 05/31/2021   GFR 54.46 (L) 05/31/2021   GFRNONAA 40 (L) 11/03/2019   GFRAA 46 (L) 11/03/2019   NA 139 05/31/2021   K 4.3 05/31/2021   CALCIUM 9.3 05/31/2021   CO2 25 05/31/2021   GLUCOSE 107 (H) 05/31/2021    Lab Results  Component Value Date/Time   HGBA1C 6.1 (A) 01/02/2021 03:45 PM   HGBA1C 5.7 (H) 04/07/2019 07:17 PM   HGBA1C 6.0 10/28/2016 11:25 AM   GFR 54.46 (L) 05/31/2021 02:03 PM   GFR 52.89 (L) 05/13/2021 03:32 PM    Last diabetic Eye exam:  Lab Results  Component Value Date/Time   HMDIABEYEEXA No Retinopathy 12/15/2017 12:00 AM    Last diabetic Foot exam: No results found for: HMDIABFOOTEX   Lab Results  Component Value Date   CHOL 131 11/15/2020   HDL 33 (L) 11/15/2020   LDLCALC 74 11/15/2020   LDLDIRECT 91.0  09/19/2020   TRIG 138 11/15/2020   CHOLHDL 4.0 11/15/2020    Hepatic Function Latest Ref Rng & Units 11/15/2020 09/19/2020 03/27/2020  Total Protein 6.0 - 8.5 g/dL 6.1 6.2 6.6  Albumin 3.6 - 4.6 g/dL 4.2 4.0 -  AST 0 - 40 IU/L _0 ALT 0 - 44 IU/L 7 13 7(L)  Alk Phosphatase 44 - 121 IU/L 69 72 -  Total Bilirubin 0.0 - 1.2 mg/dL 0.5 0.5 0.5  Bilirubin, Direct 0.0 - 0.3 mg/dL - 0.1 -    Lab Results  Component Value Date/Time   TSH 2.32 03/27/2020 04:46 PM   TSH 3.02 03/16/2019 09:12 AM    CBC Latest Ref Rng & Units 03/27/2020 11/03/2019 04/08/2019  WBC 3.8 - 10.8 Thousand/uL 7.1 8.1 6.7  Hemoglobin 13.2 - 17.1 g/dL 15.4 14.2 13.8  Hematocrit 38.5 - 50.0 % 44.6 42.3 42.0  Platelets 140 - 400 Thousand/uL 222 209 197    No results found for: VD25OH  Clinical ASCVD: {YES/NO:21197} The ASCVD Risk score (Arnett DK, et al., 2019) failed to calculate for the following reasons:   The 2019 ASCVD risk score is only valid for ages 25 to 43    Depression screen PHQ 2/9 03/27/2020 11/03/2017 10/28/2016  Decreased Interest 0 0 0  Down, Depressed, Hopeless 0 0 -  PHQ - 2 Score 0 0 0  Altered sleeping 0 - -  Tired, decreased energy 1 - -  Change in appetite 3 - -  Feeling bad or failure about yourself  0 - -  Trouble concentrating 1 - -  Moving slowly or fidgety/restless 0 - -  Suicidal thoughts 0 - -  PHQ-9 Score 5 - -  Difficult doing work/chores Not difficult at all - -     ***Other: (CHADS2VASc if Afib, MMRC or CAT for COPD, ACT, DEXA)  Social History   Tobacco Use  Smoking Status Former   Packs/day: 1.00   Years: 30.00   Pack years: 30.00   Types: Cigarettes   Quit date: 03/13/1988   Years since quitting: 33.5  Smokeless Tobacco Current   Types: Chew   BP Readings from Last 3 Encounters:  05/31/21 120/62  05/13/21 132/60  02/27/21 (!) 142/68   Pulse Readings from Last 3 Encounters:  05/31/21 73  05/13/21 64  02/27/21 76   Wt Readings from Last 3 Encounters:   05/31/21 202 lb 8 oz (91.9 kg)  05/13/21 206 lb 4.8 oz (93.6 kg)  02/27/21 203 lb 14.4 oz (92.5 kg)   BMI Readings from Last 3 Encounters:  05/31/21 26.00 kg/m  05/13/21 26.49 kg/m  02/27/21 26.18 kg/m    Assessment/Interventions: Review of patient past medical history, allergies, medications, health status, including review of consultants reports, laboratory and other test data, was performed as part of comprehensive evaluation and provision of chronic care management services.   SDOH:  (Social Determinants of Health) assessments and interventions performed: {yes/no:20286}  SDOH Screenings   Alcohol Screen: Not on file  Depression (PHQ2-9): Not on file  Financial Resource Strain: Not on file  Food Insecurity: Not on file  Housing: Not on file  Physical Activity: Not on file  Social Connections: Not on file  Stress: Not on file  Tobacco Use: High Risk   Smoking Tobacco Use: Former   Smokeless Tobacco Use: Current   Passive Exposure: Not on file  Transportation Needs: Not on file    CCM Care Plan  Allergies  Allergen Reactions   Amoxicillin-Pot Clavulanate Diarrhea and Nausea Only   Losartan Other (See Comments) and Nausea And Vomiting    Elevated creatinine levels Elevated creatinine levels    Medications Reviewed Today     Reviewed by [name], MD (Physician) on 05/31/21 at [place] List Status: <None>   Medication Order Taking? Sig Documenting Provider Last Dose Status Informant  acetaminophen (TYLENOL) 500 MG tablet [id number] Yes Take 500 mg by mouth every 6 (six) hours as needed for mild pain. [provider] Taking Active Multiple Informants  amLODipine (NORVASC) 2.5 MG tablet [id number] Yes TAKE 1 TABLET EVERY DAY Burchette, Alinda Sierras, MD Taking Active   budesonide-formoterol [facility]) 160-4.5 MCG/ACT inhaler [id number] Yes TAKE 2 PUFFS BY MOUTH TWICE A DAY Burchette, Alinda Sierras, MD Taking Active   clobetasol ointment (TEMOVATE) 0.05 %  [id number] Yes Apply 1 application topically 2 (two) times daily as needed (Rash).  [provider] Taking Active Multiple Informants  ELIQUIS 2.5 MG TABS tablet [id number] Yes TAKE 1 TABLET TWICE DAILY Turner, Eber Hong, MD Taking Active   finasteride (PROSCAR) 5 MG tablet [id number] Yes Take 1 tablet (5 mg total) by mouth daily. [name], MD Taking Active   furosemide (LASIX) 40 MG tablet [id number] Yes Take one tablet daily by mouth as needed for leg edema [name], MD Taking Active   glucosamine-chondroitin 500-400 MG tablet [id number] Yes Take 2 tablets by mouth daily. [provider] Taking Active  lansoprazole (PREVACID) 30 MG capsule [id number] Yes TAKE 1 CAPSULE EVERY DAY AT 12 NOON Turner, Eber Hong, MD Taking Active   lovastatin (MEVACOR) 20 MG tablet [id number] Yes TAKE 1 TABLET (20 MG TOTAL) BY MOUTH AT BEDTIME. [name], MD Taking Active   metoprolol succinate (TOPROL-XL) 25 MG 24 hr tablet [id number] Yes TAKE 1 TABLET EVERY DAY Burchette, Alinda Sierras, MD Taking Active   metroNIDAZOLE (METROCREAM) 0.75 % cream [id number] Yes APPLY TOPICALLY 3 TIMES A  WEEK ON THE DAYS HE SHAVES Burchette, Alinda Sierras, MD Taking Active Multiple Informants  nitroGLYCERIN (NITROSTAT) 0.4 MG SL tablet [id number] Yes Place 1 tablet (0.4 mg total) under the tongue every 5 (five) minutes x 3 doses as needed for chest pain. Sande Rives E, PA-C Taking Active   tamsulosin (FLOMAX) 0.4 MG CAPS capsule [id number] Yes TAKE 1 CAPSULE EVERY DAY Burchette, Alinda Sierras, MD Taking Active   vitamin B-12 (CYANOCOBALAMIN) 500 MCG tablet [id number] Yes Take 500 mcg by mouth daily. [provider] Taking Active Multiple Informants  VITAMIN D PO [id number] Yes Take 400 Units by mouth daily.  [provider] Taking Active Multiple Informants            Patient Active Problem List   Diagnosis Date Noted   Ventricular bigeminy 04/08/2019   Unstable angina ([place]) 04/07/2019   Right  sciatic nerve pain 07/18/2015   Recurrent vertigo 07/18/2015   BPH (benign prostatic hyperplasia) 06/22/2015   CKD (chronic kidney disease) stage 3, GFR 30-59 ml/min ([place]) 12/22/2014   Lumbar stenosis with neurogenic claudication 04/20/2014   Spinal stenosis of lumbar region 12/15/2013   Need for shingles vaccine 08/31/2012   Prediabetes 10/16/2011   CARCINOMA, SKIN, SQUAMOUS CELL 09/11/2009   INSOMNIA, CHRONIC 09/11/2009   PULMONARY NODULE 09/11/2009   ATRIAL FIBRILLATION 04/07/2009   BENIGN PROSTATIC HYPERTROPHY, HX OF 04/07/2009   POLYPECTOMY, HX OF 04/07/2009   Hyperlipidemia 03/13/2009   Essential hypertension 03/13/2009   GERD 03/13/2009   ROSACEA 03/13/2009   ACTINIC KERATOSIS 03/13/2009   SKIN CANCER, HX OF 03/13/2009   COLONIC POLYPS, HX OF 03/13/2009    Immunization History  Administered Date(s) Administered   Fluad Quad(high Dose 65+) 05/03/2019, 05/31/2020, 05/31/2021   Influenza Split 08/06/2011, 07/01/2012   Influenza Whole 06/06/2009, 05/07/2010   Influenza, High Dose Seasonal PF 07/06/2013, 06/22/2015, 04/25/2016, 08/05/2017, 07/20/2018   Influenza,inj,Quad PF,6+ Mos 05/24/2014   PFIZER(Purple Top)SARS-COV-2 Vaccination 08/30/2019, 09/30/2019   Pneumococcal Conjugate-13 12/22/2014   Pneumococcal Polysaccharide-23 10/16/2011   Td 08/29/2010   Zoster, Live 08/21/2014    Conditions to be addressed/monitored:  {USCCMDZASSESSMENTOPTIONS:23563}  There are no care plans that you recently modified to display for this patient.    Medication Assistance: {MEDASSISTANCEINFO:25044}  Compliance/Adherence/Medication fill history: Care Gaps: Shingrix, COVID vaccine, tetanus Last BP - 120/62 on 05/31/2021 Last A1C - 6.1 on 01/02/2021  Star-Rating Drugs: Lovastatin 75m - [facility] Patient's preferred pharmacy is:  [id number] [place]: [phone]:  [phone] - [place]one: [phone]: [phone] [address] [address]one: [phone]: [phone] pill box? {Yes or If no, why not?:20788} Pt endorses ***% compliance  We discussed: {Pharmacy options:24294} Patient decided to: {US Pharmacy  FSFS:23953}  Care Plan and Follow Up Patient Decision:  {FOLLOWUP:24991}  Plan: {CM FOLLOW UP UYEB:34356}  Jeni Salles, PharmD, [facility] Clinical Pharmacist {MP practice sites:26434} [phone]

## 2021-09-11 NOTE — Progress Notes (Signed)
Chronic Care Management Pharmacy Note  09/11/2021 Name:  [name] MRN:  [id number] DOB:  1932-05-23  Summary: Pt still reports dizziness/lightheadedness often  Recommendations/Changes made from today's visit: -Recommended moving metoprolol to bedtime with other medications to see if this improves dizziness -Recommend spirometry testing to ensure appropriate inhaler therapy -Recommended checking BP at least weekly  Plan: Fill out Symbicort PAP Follow up BP assessment in 1 month to see if dizziness has lessened   Subjective: [name] is an 88 y.o. year old male who is a primary patient of Burchette, Alinda Sierras, MD.  The CCM team was consulted for assistance with disease management and care coordination needs.    Engaged with patient by telephone for follow up visit in response to provider referral for pharmacy case management and/or care coordination services.   Consent to Services:  The patient was given information about Chronic Care Management services, agreed to services, and gave verbal consent prior to initiation of services.  Please see initial visit note for detailed documentation.   Patient Care Team: [name], MD as PCP - General Sueanne Margarita, MD as PCP - Cardiology (Cardiology) Viona Gilmore, [facility] as Pharmacist (Pharmacist)  Recent office visits: 05/31/21 [name]: Patient presented for edema follow up.  Prescribed potassium chloride 10 mEq 2 tablets daily on the days he takes furosemide.  05/13/21 [name] (PCP) - seen for bilateral leg edema and other issue. Increased furosemide to 30m as needed for leg edema from 275mas needed. Discontinued gabapentin. Follow up in 3 weeks.   Recent consult visits: None  Hospital visits: None in previous 6 months   Objective:  Lab Results  Component Value Date   CREATININE 1.20 05/31/2021   BUN 21 05/31/2021   GFR 54.46 (L) 05/31/2021   GFRNONAA 40 (L) 11/03/2019   GFRAA  46 (L) 11/03/2019   NA 139 05/31/2021   K 4.3 05/31/2021   CALCIUM 9.3 05/31/2021   CO2 25 05/31/2021   GLUCOSE 107 (H) 05/31/2021    Lab Results  Component Value Date/Time   HGBA1C 6.1 (A) 01/02/2021 03:45 PM   HGBA1C 5.7 (H) 04/07/2019 07:17 PM   HGBA1C 6.0 10/28/2016 11:25 AM   GFR 54.46 (L) 05/31/2021 02:03 PM   GFR 52.89 (L) 05/13/2021 03:32 PM    Last diabetic Eye exam:  Lab Results  Component Value Date/Time   HMDIABEYEEXA No Retinopathy 12/15/2017 12:00 AM    Last diabetic Foot exam: No results found for: HMDIABFOOTEX   Lab Results  Component Value Date   CHOL 131 11/15/2020   HDL 33 (L) 11/15/2020   LDLCALC 74 11/15/2020   LDLDIRECT 91.0 09/19/2020   TRIG 138 11/15/2020   CHOLHDL 4.0 11/15/2020    Hepatic Function Latest Ref Rng & Units 11/15/2020 09/19/2020 03/27/2020  Total Protein 6.0 - 8.5 g/dL 6.1 6.2 6.6  Albumin 3.6 - 4.6 g/dL 4.2 4.0 -  AST 0 - 40 IU/L _0 ALT 0 - 44 IU/L 7 13 7(L)  Alk Phosphatase 44 - 121 IU/L 69 72 -  Total Bilirubin 0.0 - 1.2 mg/dL 0.5 0.5 0.5  Bilirubin, Direct 0.0 - 0.3 mg/dL - 0.1 -    Lab Results  Component Value Date/Time   TSH 2.32 03/27/2020 04:46 PM   TSH 3.02 03/16/2019 09:12 AM    CBC Latest Ref Rng & Units 03/27/2020 11/03/2019 04/08/2019  WBC 3.8 - 10.8 Thousand/uL 7.1 8.1 6.7  Hemoglobin 13.2 -  17.1 g/dL 15.4 14.2 13.8  °Hematocrit 38.5 - 50.0 % 44.6 42.3 42.0  °Platelets 140 - 400 Thousand/uL 222 209 197  ° ° °No results found for: VD25OH ° °Clinical ASCVD: No  °The ASCVD Risk score (Arnett DK, et al., 2019) failed to calculate for the following reasons: °  The 2019 ASCVD risk score is only valid for ages 40 to 79   ° °Depression screen PHQ 2/9 03/27/2020 11/03/2017 10/28/2016  °Decreased Interest 0 0 0  °Down, Depressed, Hopeless 0 0 -  °PHQ - 2 Score 0 0 0  °Altered sleeping 0 - -  °Tired, decreased energy 1 - -  °Change in appetite 3 - -  °Feeling bad or failure about yourself  0 - -  °Trouble concentrating 1 - -   °Moving slowly or fidgety/restless 0 - -  °Suicidal thoughts 0 - -  °PHQ-9 Score 5 - -  °Difficult doing work/chores Not difficult at all - -  °  ° °CHA2DS2/VAS Stroke Risk Points  Current as of 37 minutes ago  °   3 >= 2 Points: High Risk  °1 - 1.99 Points: Medium Risk  °0 Points: Low Risk  °  Last Change: N/A   °  ° Details   ° This score determines the patient's risk of having a stroke if the  °patient has atrial fibrillation.  °  °  ° Points Metrics  °0 Has Congestive Heart Failure:  No   ° Current as of 37 minutes ago  °0 Has Vascular Disease:  No   ° Current as of 37 minutes ago  °1 Has Hypertension:  Yes   ° Current as of 37 minutes ago  °2 Age:  89   ° Current as of 37 minutes ago  °0 Has Diabetes:  No   ° Current as of 37 minutes ago  °0 Had Stroke:  No  Had TIA:  No  Had Thromboembolism:  No   ° Current as of 37 minutes ago  °0 Male:  No   ° Current as of 37 minutes ago  °  ° °Social History  ° °Tobacco Use  °Smoking Status Former  ° Packs/day: 1.00  ° Years: 30.00  ° Pack years: 30.00  ° Types: Cigarettes  ° Quit date: 03/13/1988  ° Years since quitting: 33.5  °Smokeless Tobacco Current  ° Types: Chew  ° °BP Readings from Last 3 Encounters:  °05/31/21 120/62  °05/13/21 132/60  °02/27/21 (!) 142/68  ° °Pulse Readings from Last 3 Encounters:  °05/31/21 73  °05/13/21 64  °02/27/21 76  ° °Wt Readings from Last 3 Encounters:  °05/31/21 202 lb 8 oz (91.9 kg)  °05/13/21 206 lb 4.8 oz (93.6 kg)  °02/27/21 203 lb 14.4 oz (92.5 kg)  ° °BMI Readings from Last 3 Encounters:  °05/31/21 26.00 kg/m²  °05/13/21 26.49 kg/m²  °02/27/21 26.18 kg/m²  ° ° °Assessment/Interventions: Review of patient past medical history, allergies, medications, health status, including review of consultants reports, laboratory and other test data, was performed as part of comprehensive evaluation and provision of chronic care management services.  ° °SDOH:  (Social Determinants of Health) assessments and interventions performed: No ° °SDOH  Screenings  ° °Alcohol Screen: Not on file  °Depression (PHQ2-9): Not on file  °Financial Resource Strain: Not on file  °Food Insecurity: Not on file  °Housing: Not on file  °Physical Activity: Not on file  °Social Connections: Not on file  °Stress: Not on file  °  Tobacco Use: High Risk  ° Smoking Tobacco Use: Former  ° Smokeless Tobacco Use: Current  ° Passive Exposure: Not on file  °Transportation Needs: Not on file  ° ° °CCM Care Plan ° °Allergies  °Allergen Reactions  ° Amoxicillin-Pot Clavulanate Diarrhea and Nausea Only  ° Losartan Other (See Comments) and Nausea And Vomiting  °  Elevated creatinine levels °Elevated creatinine levels  ° ° °Medications Reviewed Today   ° ° Reviewed by , Madeline G, RPH (Pharmacist) on 09/11/21 at 1611  Med List Status: <None>  ° °Medication Order Taking? Sig Documenting Provider Last Dose Status Informant  °acetaminophen (TYLENOL) 500 MG tablet [id number]  Take 500 mg by mouth every 6 (six) hours as needed for mild pain. [provider]  Active Multiple Informants  °amLODipine (NORVASC) 2.5 MG tablet [id number]  Take 1 tablet (2.5 mg total) by mouth daily. Burchette, Bruce W, MD  Active   °apixaban (ELIQUIS) 5 MG TABS tablet [id number] Yes Take 1 tablet (5 mg total) by mouth 2 (two) times daily. Turner, Traci R, MD Taking Active   °budesonide-formoterol (SYMBICORT) 160-4.5 MCG/ACT inhaler [id number] Yes TAKE 2 PUFFS BY MOUTH TWICE A DAY Burchette, Bruce W, MD Taking Active   °clobetasol ointment (TEMOVATE) 0.05 % [id number]  Apply 1 application topically 2 (two) times daily as needed (Rash).  [provider]  Active Multiple Informants  °finasteride (PROSCAR) 5 MG tablet [id number]  Take 1 tablet (5 mg total) by mouth daily. Burchette, Bruce W, MD  Active   °furosemide (LASIX) 40 MG tablet [id number]  Take one tablet a day as needed for leg swelling. Burchette, Bruce W, MD  Active   °glucosamine-chondroitin 500-400 MG tablet [id number]  Take 2 tablets by mouth  daily. [provider]  Active   °lansoprazole (PREVACID) 30 MG capsule [id number]  TAKE 1 CAPSULE EVERY DAY AT 12 NOON. Please make yearly appt with Dr. Turner for March 2023 for future refills. Thank you 1st attempt Turner, Traci R, MD  Active   °lovastatin (MEVACOR) 20 MG tablet [id number]  Take 1 tablet (20 mg total) by mouth at bedtime. Burchette, Bruce W, MD  Active   °metoprolol succinate (TOPROL-XL) 25 MG 24 hr tablet [id number]  Take 1 tablet (25 mg total) by mouth daily. Burchette, Bruce W, MD  Active   °metroNIDAZOLE (METROCREAM) 0.75 % cream [id number]  APPLY TOPICALLY 3 TIMES A  WEEK ON THE DAYS HE SHAVES Burchette, Bruce W, MD  Active Multiple Informants  °nitroGLYCERIN (NITROSTAT) 0.4 MG SL tablet [id number]  Place 1 tablet (0.4 mg total) under the tongue every 5 (five) minutes x 3 doses as needed for chest pain. Goodrich, Callie E, PA-C  Active   °potassium chloride (KLOR-CON 10) 10 MEQ tablet [id number]  Take two tablets by mouth once daily Burchette, Bruce W, MD  Active   °tamsulosin (FLOMAX) 0.4 MG CAPS capsule [id number]  Take 1 capsule (0.4 mg total) by mouth daily.  °Patient taking differently: Take 0.4 mg by mouth in the morning and at bedtime.  ° Burchette, Bruce W, MD  Active   °vitamin B-12 (CYANOCOBALAMIN) 500 MCG tablet [id number]  Take 500 mcg by mouth daily. [provider]  Active Multiple Informants  °VITAMIN D PO [id number]  Take 400 Units by mouth daily.  [provider]  Active Multiple Informants  ° °  °  ° °  ° ° °Patient Active Problem List  ° Diagnosis Date Noted  ° Ventricular bigeminy 04/08/2019  ° Unstable angina (HCC)   04/07/2019  ° Right sciatic nerve pain 07/18/2015  ° Recurrent vertigo 07/18/2015  ° BPH (benign prostatic hyperplasia) 06/22/2015  ° CKD (chronic kidney disease) stage 3, GFR 30-59 ml/min (HCC) 12/22/2014  ° Lumbar stenosis with neurogenic claudication 04/20/2014  ° Spinal stenosis of lumbar region 12/15/2013  ° Need for shingles vaccine  08/31/2012  ° Prediabetes 10/16/2011  ° CARCINOMA, SKIN, SQUAMOUS CELL 09/11/2009  ° INSOMNIA, CHRONIC 09/11/2009  ° PULMONARY NODULE 09/11/2009  ° ATRIAL FIBRILLATION 04/07/2009  ° BENIGN PROSTATIC HYPERTROPHY, HX OF 04/07/2009  ° POLYPECTOMY, HX OF 04/07/2009  ° Hyperlipidemia 03/13/2009  ° Essential hypertension 03/13/2009  ° GERD 03/13/2009  ° ROSACEA 03/13/2009  ° ACTINIC KERATOSIS 03/13/2009  ° SKIN CANCER, HX OF 03/13/2009  ° COLONIC POLYPS, HX OF 03/13/2009  ° ° °Immunization History  °Administered Date(s) Administered  ° Fluad Quad(high Dose 65+) 05/03/2019, 05/31/2020, 05/31/2021  ° Influenza Split 08/06/2011, 07/01/2012  ° Influenza Whole 06/06/2009, 05/07/2010  ° Influenza, High Dose Seasonal PF 07/06/2013, 06/22/2015, 04/25/2016, 08/05/2017, 07/20/2018  ° Influenza,inj,Quad PF,6+ Mos 05/24/2014  ° PFIZER(Purple Top)SARS-COV-2 Vaccination 08/30/2019, 09/30/2019  ° Pneumococcal Conjugate-13 12/22/2014  ° Pneumococcal Polysaccharide-23 10/16/2011  ° Td 08/29/2010  ° Zoster, Live 08/21/2014  ° °Spoke with patient's wife for the call as patient did not wish to speak with me. Patient's wife denies any concerns with his current medications although does note that the Symbicort and Eliquis are more expensive and that they just switched to a new insurance this year and a new pharmacy. ° °Patient's wife reports patient often feels dizzy and lightheaded. He had severe back problems and had shots in his back and doesn't get a lot of exercise because of the back pain, which she thinks may have contributed as well. ° °Conditions to be addressed/monitored:  °Hypertension, Hyperlipidemia, Atrial Fibrillation, GERD, Chronic Kidney Disease, and BPH ° °Conditions addressed this visit: °Hypertension, BPH ° °Care Plan : CCM Pharmacy Care Plan  °Updates made by , Madeline G, RPH since 09/11/2021 12:00 AM  °  ° °Problem: Problem: Hypertension, Hyperlipidemia, Atrial Fibrillation, GERD, Chronic Kidney Disease, and BPH   °   ° °Long-Range Goal: Patient-Specific Goal   °Start Date: 09/11/2021  °Expected End Date: 09/11/2022  °This Visit's Progress: On track  °Priority: High  °Note:   °Current Barriers:  °Unable to independently afford treatment regimen °Unable to independently monitor therapeutic efficacy ° °Pharmacist Clinical Goal(s):  °Patient will verbalize ability to afford treatment regimen °achieve adherence to monitoring guidelines and medication adherence to achieve therapeutic efficacy through collaboration with PharmD and provider.  ° °Interventions: °1:1 collaboration with Burchette, Bruce W, MD regarding development and update of comprehensive plan of care as evidenced by provider attestation and co-signature °Inter-disciplinary care team collaboration (see longitudinal plan of care) °Comprehensive medication review performed; medication list updated in electronic medical record ° °Hypertension (BP goal <140/90) °-Controlled °-Current treatment: °Metoprolol succinate 25 mg 1 tablet daily - Appropriate, Effective, Safe, Accessible °Amlodipine 2.5 mg 1 tablet daily - Appropriate, Effective, Safe, Accessible °-Medications previously tried: unknown  °-Current home readings: unsure of recent readings; wife checks sometimes °-Current dietary habits: did not discuss °-Current exercise habits: walking sometimes but limited with back pain °-Reports hypotensive/hypertensive symptoms °-Educated on BP goals and benefits of medications for prevention of heart attack, stroke and kidney damage; °Importance of home blood pressure monitoring; °Proper BP monitoring technique; °Symptoms of hypotension and importance of maintaining adequate hydration; °-Counseled to monitor BP at home weekly, document, and provide log at future appointments °-  Recommended moving metoprolol to bedtime with other medications to see if this improves dizziness.  ° °Hyperlipidemia: (LDL goal < 100) °-Controlled °-Current treatment: °Lovastatin 20 mg 1 tablet daily at  bedtime - Appropriate, Effective, Safe, Accessible °-Medications previously tried: none  °-Current dietary patterns: did not discuss °-Current exercise habits: limited with back pain °-Educated on Cholesterol goals;  °-Recommended to continue current medication ° °Atrial Fibrillation (Goal: prevent stroke and major bleeding) °-Controlled °-CHADSVASC: 3 °-Current treatment: °Rate control: Metoprolol succinate 25 mg 1 tablet daily - Appropriate, Effective, Safe, Accessible °Anticoagulation: Eliquis 5 mg 1 tablet twice daily - Appropriate, Effective, Safe, Accessible °-Medications previously tried: none °-Home BP and HR readings: refer to above  °-Counseled on increased risk of stroke due to Afib and benefits of anticoagulation for stroke prevention; °-Recommended to continue current medication °Counseled on importance of taking Eliquis doses roughly 12 hours apart. ° °Query COPD (Goal: control symptoms and prevent exacerbations) °-Controlled °-Current treatment  °Symbicort 160-4.5 mcg/act  2 puffs twice daily - Query Appropriate, Effective, Safe, Accessible °-Medications previously tried: none  °-Gold Grade: unknown °-Current COPD Classification:  A (low sx, <2 exacerbations/yr) °-MMRC/CAT score: n/a °-Pulmonary function testing: none °-Exacerbations requiring treatment in last 6 months: none °-Patient reports consistent use of maintenance inhaler °-Frequency of rescue inhaler use: does not have one °-Counseled on Proper inhaler technique; °-Recommended spirometry testing to ensure appropriate inhaler therapy. °Assessed patient finances. Applied for patient assistance for Symbicort and patient was approved. ° °BPH (Goal: minimize symptoms) °-Controlled °-Current treatment  °Tamsulosin 0.4 mg 1 capsule twice daily - Appropriate, Query effective, Query Safe, Accessible °Finasteride 5 mg 1 tablet daily - Appropriate, Effective, Safe, Accessible °-Medications previously tried: none  °-Recommended taking tamsulosin with  food to minimize dizziness. ° °GERD (Goal: minimize symptoms) °-Controlled °-Current treatment  °Lansoprazole 30 mg daily at noon - Appropriate, Effective, Query Safe, Accessible °-Medications previously tried: none  °- Reassess the need for this medication at follow up. ° ° °Health Maintenance °-Vaccine gaps: tetanus, COVID booster °-Current therapy:  °APAP 500 mg as needed °Glucosamine-Chondroitin 2 tablets twice daily °Clobetasol 0.05% ointment twice daily PRN  °Metronidazole 0.75% cream three times weekly  °Nitroglycerin 0.4 mg SL  °Vitamin B12 500 mcg daily  °Vitamin D 400 units daily °Potassium chloride daily °-Educated on Cost vs benefit of each product must be carefully weighed by individual consumer °-Patient is satisfied with current therapy and denies issues °-Recommended to continue current medication ° °Patient Goals/Self-Care Activities °Patient will:  °- take medications as prescribed as evidenced by patient report and record review °check blood pressure weekly and if symptomatic, document, and provide at future appointments ° °Follow Up Plan: The care management team will reach out to the patient again over the next 30 days.  ° °  °  ° °Medication Assistance:  Symbicort [place] through [place].  Enrollment ends 08/24/22 ° °Compliance/Adherence/Medication fill history: °Care Gaps: °Tetanus, COVID booster °BP: 120/62 ° °[facility]: °Lovastatin 20mg - last filled on 05/16/21 90DS at [facility] ° °Patient's preferred pharmacy is: ° °[id number] - [place] - [address] °[address] °[place] [address] °Phone: [phone] Fax: [phone] ° °[facility] - [place], [place] - [address] °[address] °[place] [address] °Phone: [phone] Fax: [phone] ° °[facility] - [place], [place] - [address] °[address] °[place] [address] °Phone: [phone] Fax: [phone] ° °Uses  pill box? Yes °  Pt endorses 99% compliance ° °We discussed: Current pharmacy is preferred with insurance plan and patient is satisfied with pharmacy services °Patient decided to: Continue current medication management strategy ° °Care Plan and Follow Up Patient Decision:  Patient agrees to Care Plan and Follow-up. ° °Plan: The care management team will reach out to the patient again over the next 30 days. ° °Madeline , PharmD, BCACP °Clinical Pharmacist °[facility] °[phone] ° ° ° °

## 2021-09-13 ENCOUNTER — Telehealth: Payer: Self-pay

## 2021-09-13 ENCOUNTER — Other Ambulatory Visit: Payer: Self-pay

## 2021-09-13 MED ORDER — BUDESONIDE-FORMOTEROL FUMARATE 160-4.5 MCG/ACT IN AERO: INHALATION_SPRAY | RESPIRATORY_TRACT | 3 refills | Status: DC

## 2021-09-13 NOTE — Telephone Encounter (Signed)
This has been completed and the printed Rx has been given to [place].

## 2021-09-13 NOTE — Telephone Encounter (Signed)
-----   Message from [name], MD sent at 09/13/2021  9:26 AM EST ----- Regarding: RE: Symbicort rx Okay to print off as requested for 90-day and 1 year refills ----- Message ----- From: Rebecca Eaton, CMA Sent: 09/13/2021   9:21 AM EST To: [name], MD Subject: FW: Symbicort rx                               Please advise. This Rx was just sent in a few days ago. Ok for another RX? ----- Message ----- From: Viona Gilmore, [facility] Sent: 09/11/2021   4:24 PM EST To: Rebecca Eaton, CMA Subject: Symbicort rx                                   Hey,  No big rush but when you get the chance, can you print out a Symbicort rx for [name] and have Dr. Elease Hashimoto sign it? It needs to be for a 90 ds and 1 year of refills. I got him approved for patient assistance and I need to fax the prescription.  Thanks! Maddie

## 2021-09-24 DIAGNOSIS — N401 Enlarged prostate with lower urinary tract symptoms: Secondary | ICD-10-CM

## 2021-09-24 DIAGNOSIS — I10 Essential (primary) hypertension: Secondary | ICD-10-CM

## 2021-09-24 DIAGNOSIS — R3914 Feeling of incomplete bladder emptying: Secondary | ICD-10-CM

## 2021-10-01 ENCOUNTER — Telehealth: Payer: Self-pay

## 2021-10-01 NOTE — Telephone Encounter (Signed)
Patient's wife was seen in the office and asked that we refill Dean gabapentin.   This is not on the current med list please advise.   Mrs. Wakefield is aware that I am having to send a message to Dr. Elease Hashimoto.

## 2021-10-02 MED ORDER — GABAPENTIN 100 MG PO CAPS: 100.0000 mg | ORAL_CAPSULE | Freq: Every day | ORAL | 3 refills | Status: DC

## 2021-10-02 NOTE — Telephone Encounter (Signed)
Rx sent in.  Spoke with the patients wife. She is aware [facility].

## 2021-10-11 ENCOUNTER — Telehealth: Payer: Self-pay | Admitting: Pharmacist

## 2021-10-11 NOTE — Chronic Care Management (AMB) (Signed)
Chronic Care Management Pharmacy Assistant   Name: [name]  MRN: [id number] DOB: [birth year]  Reason for Encounter: Disease State / Hypertension Assessment Call   Conditions to be addressed/monitored: HTN  Recent office visits:  None  Recent consult visits:  None  Hospital visits:  None  Medications: Outpatient Encounter Medications as of 10/11/2021  Medication Sig   acetaminophen (TYLENOL) 500 MG tablet Take 500 mg by mouth every 6 (six) hours as needed for mild pain.   amLODipine (NORVASC) 2.5 MG tablet Take 1 tablet (2.5 mg total) by mouth daily.   apixaban (ELIQUIS) 5 MG TABS tablet Take 1 tablet (5 mg total) by mouth 2 (two) times daily.   budesonide-formoterol (SYMBICORT) 160-4.5 MCG/ACT inhaler TAKE 2 PUFFS BY MOUTH TWICE A DAY   clobetasol ointment (TEMOVATE) 6.76 % Apply 1 application topically 2 (two) times daily as needed (Rash).    finasteride (PROSCAR) 5 MG tablet Take 1 tablet (5 mg total) by mouth daily.   furosemide (LASIX) 40 MG tablet Take one tablet a day as needed for leg swelling.   gabapentin (NEURONTIN) 100 MG capsule Take 1 capsule (100 mg total) by mouth at bedtime.   glucosamine-chondroitin 500-400 MG tablet Take 2 tablets by mouth daily.   lansoprazole (PREVACID) 30 MG capsule TAKE 1 CAPSULE EVERY DAY AT 12 NOON. Please make yearly appt with Dr. Radford Pax for March 2023 for future refills. Thank you 1st attempt   lovastatin (MEVACOR) 20 MG tablet Take 1 tablet (20 mg total) by mouth at bedtime.   metoprolol succinate (TOPROL-XL) 25 MG 24 hr tablet Take 1 tablet (25 mg total) by mouth daily.   metroNIDAZOLE (METROCREAM) 0.75 % cream APPLY TOPICALLY 3 TIMES A  WEEK ON THE DAYS HE SHAVES   nitroGLYCERIN (NITROSTAT) 0.4 MG SL tablet Place 1 tablet (0.4 mg total) under the tongue every 5 (five) minutes x 3 doses as needed for chest pain.   potassium chloride (KLOR-CON 10) 10 MEQ tablet Take two tablets by mouth once daily   tamsulosin (FLOMAX) 0.4 MG  CAPS capsule Take 1 capsule (0.4 mg total) by mouth daily. (Patient taking differently: Take 0.4 mg by mouth in the morning and at bedtime.)   vitamin B-12 (CYANOCOBALAMIN) 500 MCG tablet Take 500 mcg by mouth daily.   VITAMIN D PO Take 400 Units by mouth daily.    No facility-administered encounter medications on file as of 10/11/2021.  Fill History: ELIQUIS 5 MG TABLET 10/09/2021 90   SYMBICORT 160-4.5 MCG INHALER 09/06/2021 90   FINASTERIDE 5 MG TABLET 09/06/2021 90   FLUOROURACIL 5% CREAM 07/12/2021 14   FUROSEMIDE 40 MG TABLET 10/02/2021 30   GABAPENTIN 100 MG CAPSULE 10/02/2021 90   LOVASTATIN 20 MG TABLET 09/06/2021 90   METOPROLOL SUCC ER 25 MG TAB 09/06/2021 90   POTASSIUM CL ER 10 MEQ TABLET 09/06/2021 30   TAMSULOSIN HCL 0.4 MG CAPSULE 09/06/2021 90   AMLODIPINE BESYLATE 2.5 MG TAB 10/09/2021 90   Reviewed chart prior to disease state call. Spoke with patient regarding BP  Recent Office Vitals: BP Readings from Last 3 Encounters:  05/31/21 120/62  05/13/21 132/60  02/27/21 (!) 142/68   Pulse Readings from Last 3 Encounters:  05/31/21 73  05/13/21 64  02/27/21 76    Wt Readings from Last 3 Encounters:  05/31/21 202 lb 8 oz (91.9 kg)  05/13/21 206 lb 4.8 oz (93.6 kg)  02/27/21 203 lb 14.4 oz (92.5 kg)     Kidney  Function Lab Results  Component Value Date/Time   CREATININE 1.20 05/31/2021 02:03 PM   CREATININE 1.23 05/13/2021 03:32 PM   CREATININE 1.53 (H) 03/27/2020 04:46 PM   GFR 54.46 (L) 05/31/2021 02:03 PM   GFRNONAA 40 (L) 11/03/2019 10:15 AM   GFRAA 46 (L) 11/03/2019 10:15 AM    BMP Latest Ref Rng & Units 05/31/2021 05/13/2021 11/15/2020  Glucose 70 - 99 mg/dL 107(H) 121(H) 109(H)  BUN 6 - 23 mg/dL 21 18 19   Creatinine 0.40 - 1.50 mg/dL 1.20 1.23 1.23  BUN/Creat Ratio 10 - 24 - - 15  Sodium 135 - 145 mEq/L 139 140 141  Potassium 3.5 - 5.1 mEq/L 4.3 4.1 4.4  Chloride 96 - 112 mEq/L 105 107 104  CO2 19 - 32 mEq/L 25 24 21   Calcium 8.4 - 10.5  mg/dL 9.3 9.0 8.9    Current antihypertensive regimen:  Amlodipine 2.5 mg 1 tablet daily Metoprolol 25 mg 1 tablet at bedtime  How often are you checking your Blood Pressure?                       Spoke with patients wife she states she is checking his blood pressures weekly.        Patients wife states he has been taking Metoprolol at bedtime, patient  states this seems to be helping his dizziness, it has improved some.    Current home BP readings: Patients recent blood pressure readings are 138/72, 136/64 and 123/53  What recent interventions/DTPs have been made by any provider to improve Blood Pressure control since last CPP Visit: Patients wife is to be checking his blood pressure once a week.  Wife is to change his metoprolol to bedtime to see if this helps with dizziness.    Any recent hospitalizations or ED visits since last visit with CPP? No recent hospital visits.   What diet changes have been made to improve Blood Pressure Control?  Patient is eating what he feels Breakfast - Patient will have cereal, fruit, waffles or a muffin Lunch - Patient will have a sandwich or cheese with crackers and fruit Dinner - Patient will generally have a meat and vegetable  What exercise is being done to improve your Blood Pressure Control?  Patient gets up and walks around the house and will walk out to the mailbox.   Adherence Review: Is the patient currently on ACE/ARB medication? Yes Does the patient have >5 day gap between last estimated fill dates? No  Care Gaps: AWV - msg sent to Ramond Craver Last BP - 120/62 on 05/31/2021 Last A1C - 6.1 on 01/02/2021 Shingrix - never done Covid vaccine - overdue TDAP - overdue  Star Rating Drugs: Lovastatin 20 mg - last filled 09/06/2021 90 DS at [place] Pharmacist Assistant [phone]

## 2021-10-28 ENCOUNTER — Other Ambulatory Visit: Payer: Self-pay | Admitting: Family Medicine

## 2021-11-11 ENCOUNTER — Telehealth: Payer: Self-pay

## 2021-11-11 NOTE — Telephone Encounter (Signed)
? ?  Pre-operative Risk Assessment  ?  ?Patient Name: [name]  ?DOB: 1932/02/11 ?MRN: [id number]  ? ?  ? ?Request for Surgical Clearance   ? ?Procedure:   Spinal Injection ? ?Date of Surgery:  Clearance TBD                              ?   ?Surgeon:  Marlaine Hind, MD ?Surgeon's Group or Practice Name:  [facility] ?Phone number:  [phone] ?Fax number:  [phone] ?  ?Type of Clearance Requested:   ?- Medical  ?- Pharmacy:  Hold Apixaban (Eliquis)   ?  ?Type of Anesthesia:  Not Indicated ?  ?Additional requests/questions:     ? ?Signed, ?Dondrell Loudermilk   ?11/11/2021, 4:08 PM  ? ?

## 2021-11-11 NOTE — Telephone Encounter (Signed)
? ?  Name: [name]  ?DOB: Dec 08, 1931  ?MRN: [id number] ? ?Primary Cardiologist: Fransico Him, MD ? ?Chart reviewed as part of pre-operative protocol coverage. Because of [name]'s past medical history and time since last visit, he will require a follow-up visit in order to better assess preoperative cardiovascular risk. ? ?Pre-op covering staff: ?- Please schedule appointment and call patient to inform them. If patient already had an upcoming appointment within acceptable timeframe, please add "pre-op clearance" to the appointment notes so provider is aware. ?- Please contact requesting surgeon's office via preferred method (i.e, phone, fax) to inform them of need for appointment prior to surgery. ? ?If applicable, this message will also be routed to pharmacy pool and/or primary cardiologist for input on holding anticoagulant/antiplatelet agent as requested below so that this information is available to the clearing provider at time of patient's appointment.  ? ?Ledora Bottcher, PA  ?11/11/2021, 5:22 PM  ? ?

## 2021-11-11 NOTE — Telephone Encounter (Signed)
Patient with diagnosis of A Fib on Eliquis for anticoagulation.   ? ?Procedure: spinal injection ?Date of procedure: TBD ? ? ?CHA2DS2-VASc Score = 3  ?This indicates a 3.2% annual risk of stroke. ?The patient's score is based upon: ?CHF History: 0 ?HTN History: 1 ?Diabetes History: 0 ?Stroke History: 0 ?Vascular Disease History: 0 ?Age Score: 2 ?Gender Score: 0 ? ?CrCl 56 mL/min ?Platelet count overdue ? ? ?Per office protocol, patient can hold Eliquis for 3 days prior to procedure.   ?

## 2021-11-13 NOTE — Telephone Encounter (Signed)
Left message for the pt to call the office to schedule a sooner appt with Dr. Radford Pax or APP for in office appt for pre op clearance. ?

## 2021-11-14 ENCOUNTER — Encounter: Payer: Self-pay | Admitting: Cardiology

## 2021-11-14 NOTE — Telephone Encounter (Signed)
Left detailed message for the pt that he is going to need an appt in the office as we will need to do an EKG. Please call the office and scheduled in office appt with Dr. Radford Pax or APP.  ?

## 2021-11-14 NOTE — Telephone Encounter (Signed)
Preoperative cardiac team, he will need an in office appointment.  He needs follow-up EKG.  Thank you for your help. ? ?Jossie Ng. Najat Olazabal NP-C ? ?  ?11/14/2021, 12:08 PM ?[place] ?[address] ?Office [phone] Fax [phone] ? ?

## 2021-11-14 NOTE — Telephone Encounter (Signed)
Wife called back, when I went to schedule she said it was going to be difficult for him to get into the office. She state he can hardly walk. She was wondering if it could be a virtual visit.  ?

## 2021-11-14 NOTE — Telephone Encounter (Signed)
error 

## 2021-11-20 ENCOUNTER — Encounter: Payer: Self-pay | Admitting: Family Medicine

## 2021-11-20 ENCOUNTER — Telehealth (INDEPENDENT_AMBULATORY_CARE_PROVIDER_SITE_OTHER): Payer: Medicare (Managed Care) | Admitting: Family Medicine

## 2021-11-20 VITALS — Ht 74.0 in | Wt 202.5 lb

## 2021-11-20 DIAGNOSIS — M5431 Sciatica, right side: Secondary | ICD-10-CM | POA: Diagnosis not present

## 2021-11-20 MED ORDER — PREDNISONE 20 MG PO TABS: ORAL_TABLET | ORAL | 0 refills | Status: DC

## 2021-11-20 MED ORDER — TRAMADOL HCL 50 MG PO TABS: 50.0000 mg | ORAL_TABLET | Freq: Four times a day (QID) | ORAL | 0 refills | Status: AC | PRN

## 2021-11-20 NOTE — Progress Notes (Signed)
Patient ID: [name], male   DOB: 1931/10/14, 88 y.o.   MRN: [id number] ? ? ?This visit type was conducted due to national recommendations for restrictions regarding the COVID-19 pandemic in an effort to limit this patient's exposure and mitigate transmission in our community.  ? ?Virtual Visit via Telephone Note ? ?I connected with [name] on 11/20/21 at  5:00 PM EDT by telephone and verified that I am speaking with the correct person using two identifiers. ?  ?I discussed the limitations, risks, security and privacy concerns of performing an evaluation and management service by telephone and the availability of in person appointments. I also discussed with the patient that there may be a patient responsible charge related to this service. The patient expressed understanding and agreed to proceed. ? ?Location patient: home ?Location provider: work or home office ?Participants present for the call: patient, provider ?Patient did not have a visit in the prior 7 days to address this/these issue(s). ? ? ?History of Present Illness: ? ?[name] called with about 4-week history of fairly severe right lower extremity pain.  He has seen neurosurgeon in the past for similar pain.  About a year ago he had injection in his back which helped significantly.  They had called his neurosurgeon's office but apparently the physician who had seen him last year is leaving the practice.  He has sharp pain which is severe radiating from the back all the way down to the foot.  No urine or stool incontinence.  Wife had some gabapentin 300 mg and was giving 4-5 times daily without much improvement.  No fever.  Pain with ambulation but able to ambulate.  MRI scan 10-18-2020 reviewed this shows spinal stenosis at multiple levels.  Previous bilateral laminectomy L3-4 and L4-5.  He has had tremendous difficulty sleeping past couple nights secondary to pain. ? ?Past Medical History:  ?Diagnosis Date  ? Actinic keratosis  03/13/2009  ? Arthritis   ? BENIGN PROSTATIC HYPERTROPHY, HX OF 04/07/2009  ? CARCINOMA, SKIN, SQUAMOUS CELL 09/11/2009  ? COLONIC POLYPS, HX OF 03/13/2009  ? Essential hypertension   ? GERD 03/13/2009  ? Hyperlipidemia   ? INSOMNIA, CHRONIC 09/11/2009  ? Persistent atrial fibrillation ([place]) 04/07/2009  ? a. s/p afib ablation at [facility] x 2 in 2010.  ? POLYPECTOMY, HX OF 04/07/2009  ? Premature atrial contractions   ? PULMONARY NODULE 09/11/2009  ? PVC's (premature ventricular contractions)   ? Rosacea 03/13/2009  ? SKIN CANCER, HX OF 03/13/2009  ? ?Past Surgical History:  ?Procedure Laterality Date  ? ABLATION  2010  ? x2  ? COLONOSCOPY W/ BIOPSIES AND POLYPECTOMY    ? EYE SURGERY Bilateral   ? cataracts  ? LUMBAR DISC SURGERY    ? RUPTURE  ? LUMBAR LAMINECTOMY/DECOMPRESSION MICRODISCECTOMY N/A 04/20/2014  ? Procedure: LUMBAR TWO-THREE, LUMBAR THREE-FOUR, LUMAR FOUR-FIVE LAMINECTOMIES;  Surgeon: Newman Pies, MD;  Location: [facility];  Service: Neurosurgery;  Laterality: N/A;  ? POLYPECTOMY    ? SKIN CANCER EXCISION    ? ? reports that he quit smoking about 33 years ago. His smoking use included cigarettes. He has a 30.00 pack-year smoking history. His smokeless tobacco use includes chew. He reports that he does not drink alcohol and does not use drugs. ?family history includes CAD in his brother, brother, and sister; Cancer in his mother; Cancer (age of onset: 75) in his father; Heart attack in his father; Heart disease in his brother, brother, father, and sister. ?Allergies  ?  Allergen Reactions  ? Amoxicillin-Pot Clavulanate Diarrhea and Nausea Only  ? Losartan Other (See Comments) and Nausea And Vomiting  ?  Elevated creatinine levels ?Elevated creatinine levels  ? ? ?  ?Observations/Objective: ?Patient sounds cheerful and well on the phone. ?I do not appreciate any SOB. ?Speech and thought processing are grossly intact. ?Patient reported vitals: ? ?Assessment and Plan: ? ?Severe right lower extremity radiculitis  symptoms.  History of lumbar stenosis at multiple levels.  He has received epidural injections in the past with some benefit. ? ?-Strongly encouraged him to follow-up with neurosurgeon and they are in process of setting up follow-up ?-Prednisone taper over the next several days.  They are aware this may exacerbate his blood sugars ?-Tramadol 50 mg 1 every 6 hours as needed for pain #30 ? ?Follow Up Instructions: ? ?99441 5-10 ?99442 11-20 ?99443 21-30 ?I did not refer this patient for an OV in the next 24 hours for this/these issue(s). ? ?I discussed the assessment and treatment plan with the patient. The patient was provided an opportunity to ask questions and all were answered. The patient agreed with the plan and demonstrated an understanding of the instructions. ?  ?The patient was advised to call back or seek an in-person evaluation if the symptoms worsen or if the condition fails to improve as anticipated. ? ?I provided 23 minutes of non-face-to-face time during this encounter. ? ? ?Carolann Littler, MD  ? ? ?

## 2021-11-22 NOTE — Telephone Encounter (Addendum)
Left detailed message for the pt to cal the office to schedule a sooner appt for pre op clearance. See previous notes. I did leave message if the pt wants to wait until he see's Dr. Radford Pax in June that is fine, but to please call the office to let us know if we need to make a sooner in office appt or just going to wait until 01/2022 appt with MD.  I will update the requesting office as well.  ?

## 2021-11-25 ENCOUNTER — Encounter: Payer: Self-pay | Admitting: *Deleted

## 2021-11-25 NOTE — Telephone Encounter (Addendum)
Left message x 3 for the pt to call the office and let us know if he wants to schedule a sooner IN OFFICE appt for pre op clearance, or if he is fine waiting until he see's Dr. Radford Pax in June. At this point though, our office has attempted x 3 to reach the pt. I am going to send a letter to the pt to call if he wants sooner appt. I will update the requesting provider as well.  ? ?I will remove from the pre op call back pool at this time.  ?MY CHART message sent today ?

## 2021-11-26 ENCOUNTER — Encounter: Payer: Self-pay | Admitting: Family Medicine

## 2021-11-26 ENCOUNTER — Telehealth: Payer: Self-pay | Admitting: Pharmacist

## 2021-11-26 ENCOUNTER — Telehealth (INDEPENDENT_AMBULATORY_CARE_PROVIDER_SITE_OTHER): Payer: Medicare (Managed Care) | Admitting: Family Medicine

## 2021-11-26 VITALS — Ht 74.0 in | Wt 202.5 lb

## 2021-11-26 DIAGNOSIS — M5416 Radiculopathy, lumbar region: Secondary | ICD-10-CM | POA: Diagnosis not present

## 2021-11-26 MED ORDER — TRAMADOL HCL 50 MG PO TABS: 50.0000 mg | ORAL_TABLET | Freq: Four times a day (QID) | ORAL | 0 refills | Status: AC | PRN

## 2021-11-26 MED ORDER — GABAPENTIN 300 MG PO CAPS: 300.0000 mg | ORAL_CAPSULE | Freq: Three times a day (TID) | ORAL | 3 refills | Status: DC

## 2021-11-26 NOTE — Progress Notes (Signed)
Patient ID: [name], male   DOB: 01/31/1932, 88 y.o.   MRN: [id number] ? ?This visit type was conducted due to national recommendations for restrictions regarding the COVID-19 pandemic in an effort to limit this patient's exposure and mitigate transmission in our community.  ? ?Virtual Visit via Telephone Note ? ?I connected with [name] on 11/26/21 at  2:00 PM EDT by telephone and verified that I am speaking with the correct person using two identifiers. ?  ?I discussed the limitations, risks, security and privacy concerns of performing an evaluation and management service by telephone and the availability of in person appointments. I also discussed with the patient that there may be a patient responsible charge related to this service. The patient expressed understanding and agreed to proceed.  His wife assisted with this call as patient has severe hearing difficulties. ? ?Location patient: home ?Location provider: work or home office ?Participants present for the call: patient, provider ?Patient did not have a visit in the prior 7 days to address this/these issue(s). ? ? ?History of Present Illness: ? ?History of severe low back pain.  Refer to prior note for details ? ?[name] called with about 4-week history of fairly severe right lower extremity pain.  He has seen neurosurgeon in the past for similar pain.  About a year ago he had injection in his back which helped significantly.  They had called his neurosurgeon's office but apparently the physician who had seen him last year is leaving the practice.  He has sharp pain which is severe radiating from the back all the way down to the foot.  No urine or stool incontinence.  Wife had some gabapentin 300 mg and was giving 4-5 times daily without much improvement.  No fever.  Pain with ambulation but able to ambulate.  MRI scan 10-18-2020 reviewed this shows spinal stenosis at multiple levels.  Previous bilateral laminectomy L3-4 and L4-5.  He has  had tremendous difficulty sleeping past couple nights secondary to pain. ? ?We sent in some prednisone and wife thinks it may have helped slightly.  He still having severe pain at times.  She is giving tramadol sporadically and that may be helping his pain slightly.  She has not been given gabapentin regularly since we talked her last.  They do have appointment set up with neurosurgery.  They are planning to get injection but this is not until the 19th.  He is not ambulating much and ambulating with a walker when he does.  Still having fairly severe breakthrough pain especially in the mornings and in the evenings. ? ?Past Medical History:  ?Diagnosis Date  ? Actinic keratosis 03/13/2009  ? Arthritis   ? BENIGN PROSTATIC HYPERTROPHY, HX OF 04/07/2009  ? CARCINOMA, SKIN, SQUAMOUS CELL 09/11/2009  ? COLONIC POLYPS, HX OF 03/13/2009  ? Essential hypertension   ? GERD 03/13/2009  ? Hyperlipidemia   ? INSOMNIA, CHRONIC 09/11/2009  ? Persistent atrial fibrillation ([place]) 04/07/2009  ? a. s/p afib ablation at [facility] x 2 in 2010.  ? POLYPECTOMY, HX OF 04/07/2009  ? Premature atrial contractions   ? PULMONARY NODULE 09/11/2009  ? PVC's (premature ventricular contractions)   ? Rosacea 03/13/2009  ? SKIN CANCER, HX OF 03/13/2009  ? ?Past Surgical History:  ?Procedure Laterality Date  ? ABLATION  2010  ? x2  ? COLONOSCOPY W/ BIOPSIES AND POLYPECTOMY    ? EYE SURGERY Bilateral   ? cataracts  ? LUMBAR DISC SURGERY    ?  RUPTURE  ? LUMBAR LAMINECTOMY/DECOMPRESSION MICRODISCECTOMY N/A 04/20/2014  ? Procedure: LUMBAR TWO-THREE, LUMBAR THREE-FOUR, LUMAR FOUR-FIVE LAMINECTOMIES;  Surgeon: Steven Pies, MD;  Location: [facility];  Service: Neurosurgery;  Laterality: N/A;  ? POLYPECTOMY    ? SKIN CANCER EXCISION    ? ? reports that he quit smoking about 33 years ago. His smoking use included cigarettes. He has a 30.00 pack-year smoking history. His smokeless tobacco use includes chew. He reports that he does not drink alcohol and does not use  drugs. ?family history includes CAD in his brother, brother, and sister; Cancer in his mother; Cancer (age of onset: 19) in his father; Heart attack in his father; Heart disease in his brother, brother, father, and sister. ?Allergies  ?Allergen Reactions  ? Amoxicillin-Pot Clavulanate Diarrhea and Nausea Only  ? Losartan Other (See Comments) and Nausea And Vomiting  ?  Elevated creatinine levels ?Elevated creatinine levels  ? ? ?  ?Observations/Objective: ?Patient sounds cheerful and well on the phone. ?I do not appreciate any SOB. ?Speech and thought processing are grossly intact. ?Patient reported vitals: ? ?Assessment and Plan: ? ?Severe right lumbar radiculitis.  Patient has pending appointment with neurosurgeon. ? ?-Finish out prednisone ?-Refill tramadol 50 mg 1 every 6 hours as needed for severe pain ?-Gabapentin 300 mg 3 times daily.  They are aware of potential for sedation and dizziness with this. ?-Keep appointment as above ? ?Follow Up Instructions ? ? ?27253 5-10 ?99442 11-20 ?99443 21-30 ?I did not refer this patient for an OV in the next 24 hours for this/these issue(s). ? ?I discussed the assessment and treatment plan with the patient. The patient was provided an opportunity to ask questions and all were answered. The patient agreed with the plan and demonstrated an understanding of the instructions. ?  ?The patient was advised to call back or seek an in-person evaluation if the symptoms worsen or if the condition fails to improve as anticipated. ? ?I provided 22 minutes of non-face-to-face time during this encounter. ? ? ?Carolann Littler, MD  ? ?

## 2021-11-26 NOTE — Chronic Care Management (AMB) (Signed)
? ? ?Chronic Care Management ?Pharmacy Assistant  ? ?Name: [name]  MRN: [id number] DOB: 12-07-1931 ? ?Reason for Encounter: Disease State / Hypertension Assessment Call ?  ?Conditions to be addressed/monitored: ?HTN ? ?Recent office visits:  ?11/26/2021 [name] - Patient was seen for right lumbar radiculitis. Increased Gabapentin to 300 mg 3 times daily. No follow up noted.  ? ?11/20/2021 [name] - Patient was seen for right sciatic nerve pain. Started on Prednisone '20mg'$  taper and Tramadol 50 mg every 6 hours prn. No follow up noted.  ? ?Recent consult visits:  ?None ? ?Hospital visits:  ?None ? ?Medications: ?Outpatient Encounter Medications as of 11/26/2021  ?Medication Sig  ? acetaminophen (TYLENOL) 500 MG tablet Take 500 mg by mouth every 6 (six) hours as needed for mild pain.  ? amLODipine (NORVASC) 2.5 MG tablet Take 1 tablet (2.5 mg total) by mouth daily.  ? apixaban (ELIQUIS) 5 MG TABS tablet Take 1 tablet (5 mg total) by mouth 2 (two) times daily.  ? budesonide-formoterol (SYMBICORT) 160-4.5 MCG/ACT inhaler TAKE 2 PUFFS BY MOUTH TWICE A DAY  ? clobetasol ointment (TEMOVATE) 9.56 % Apply 1 application topically 2 (two) times daily as needed (Rash).   ? finasteride (PROSCAR) 5 MG tablet Take 1 tablet (5 mg total) by mouth daily.  ? furosemide (LASIX) 40 MG tablet TAKE ONE TABLET DAILY BY MOUTH AS NEEDED FOR LEG EDEMA  ? gabapentin (NEURONTIN) 300 MG capsule Take 1 capsule (300 mg total) by mouth 3 (three) times daily.  ? glucosamine-chondroitin 500-400 MG tablet Take 2 tablets by mouth daily.  ? lansoprazole (PREVACID) 30 MG capsule TAKE 1 CAPSULE EVERY DAY AT 12 NOON. Please make yearly appt with Dr. Radford Pax for March 2023 for future refills. Thank you 1st attempt  ? lovastatin (MEVACOR) 20 MG tablet Take 1 tablet (20 mg total) by mouth at bedtime.  ? metoprolol succinate (TOPROL-XL) 25 MG 24 hr tablet Take 1 tablet (25 mg total) by mouth daily.  ? metroNIDAZOLE (METROCREAM) 0.75  % cream APPLY TOPICALLY 3 TIMES A  WEEK ON THE DAYS HE SHAVES  ? nitroGLYCERIN (NITROSTAT) 0.4 MG SL tablet Place 1 tablet (0.4 mg total) under the tongue every 5 (five) minutes x 3 doses as needed for chest pain.  ? potassium chloride (KLOR-CON 10) 10 MEQ tablet Take two tablets by mouth once daily  ? predniSONE (DELTASONE) 20 MG tablet Taper as follows over 8 days: 3-3-2-2-1-1-1/2-1/2  ? tamsulosin (FLOMAX) 0.4 MG CAPS capsule Take 1 capsule (0.4 mg total) by mouth daily. (Patient taking differently: Take 0.4 mg by mouth in the morning and at bedtime.)  ? traMADol (ULTRAM) 50 MG tablet Take 1 tablet (50 mg total) by mouth every 6 (six) hours as needed for up to 5 days.  ? vitamin B-12 (CYANOCOBALAMIN) 500 MCG tablet Take 500 mcg by mouth daily.  ? VITAMIN D PO Take 400 Units by mouth daily.   ? ?No facility-administered encounter medications on file as of 11/26/2021.  ?Fill History: ?ELIQUIS 5 MG TABLET 10/09/2021 90  ? ?SYMBICORT 160-4.5 MCG INHALER 09/06/2021 90  ? ?FINASTERIDE 5 MG TABLET 09/06/2021 90  ? ?FUROSEMIDE 40 MG TABLET 11/25/2021 30  ? ?GABAPENTIN 100 MG CAPSULE 10/02/2021 90  ? ?LOVASTATIN 20 MG TABLET 09/06/2021 90  ? ?METOPROLOL SUCC ER 25 MG TAB 09/06/2021 90  ? ?POTASSIUM CL ER 10 MEQ TABLET 10/17/2021 30  ? ?TAMSULOSIN HCL 0.4 MG CAPSULE 09/06/2021 90  ? ?AMLODIPINE BESYLATE 2.5 MG  TAB 10/09/2021 90  ? ?TRAMADOL HCL 50 MG TABLET 11/20/2021 7  ? ?Reviewed chart prior to disease state call. Spoke with patient regarding BP ? ?Recent Office Vitals: ?BP Readings from Last 3 Encounters:  ?05/31/21 120/62  ?05/13/21 132/60  ?02/27/21 (!) 142/68  ? ?Pulse Readings from Last 3 Encounters:  ?05/31/21 73  ?05/13/21 64  ?02/27/21 76  ?  ?Wt Readings from Last 3 Encounters:  ?11/26/21 202 lb 8 oz (91.9 kg)  ?11/20/21 202 lb 8 oz (91.9 kg)  ?05/31/21 202 lb 8 oz (91.9 kg)  ?  ? ?Kidney Function ?Lab Results  ?Component Value Date/Time  ? CREATININE 1.20 05/31/2021 02:03 PM  ? CREATININE 1.23 05/13/2021 03:32  PM  ? CREATININE 1.53 (H) 03/27/2020 04:46 PM  ? GFR 54.46 (L) 05/31/2021 02:03 PM  ? GFRNONAA 40 (L) 11/03/2019 10:15 AM  ? GFRAA 46 (L) 11/03/2019 10:15 AM  ? ? ? ?  Latest Ref Rng & Units 05/31/2021  ?  2:03 PM 05/13/2021  ?  3:32 PM 11/15/2020  ?  9:18 AM  ?BMP  ?Glucose 70 - 99 mg/dL 107   121   109    ?BUN 6 - 23 mg/dL '21   18   19    '$ ?Creatinine 0.40 - 1.50 mg/dL 1.20   1.23   1.23    ?BUN/Creat Ratio 10 - 24   15    ?Sodium 135 - 145 mEq/L 139   140   141    ?Potassium 3.5 - 5.1 mEq/L 4.3   4.1   4.4    ?Chloride 96 - 112 mEq/L 105   107   104    ?CO2 19 - 32 mEq/L '25   24   21    '$ ?Calcium 8.4 - 10.5 mg/dL 9.3   9.0   8.9    ? ? ?Current antihypertensive regimen:  ?Amlodipine 2.5 mg 1 tablet daily ?Metoprolol 25 mg 1 tablet at bedtime ? ?How often are you checking your Blood Pressure? Patient's wife is checking his blood pressures weekly, she states the dizziness is still improved.  He has been taking pain medication for back pain and feels that could be part of the problem.  ? ?Current home BP readings: Patient's recent readings have been 141/70, 148/72 and 142/77 ? ?What recent interventions/DTPs have been made by any provider to improve Blood Pressure control since last CPP Visit: No recent interventions ? ?Any recent hospitalizations or ED visits since last visit with CPP?  No recent hospital visits.  ? ?What diet changes have been made to improve Blood Pressure Control?  ?Patient is eating what he feels ?Breakfast - Patient will have cereal, fruit, waffles or a muffin ?Lunch - Patient will have a sandwich or cheese with crackers and fruit ?Dinner - Patient will generally have a meat and vegetable ? ?What exercise is being done to improve your Blood Pressure Control?  ?Patient gets up and walks around the house and will walk out to the mailbox, activity is limited due to back pain.  ? ?Adherence Review: ?Is the patient currently on ACE/ARB medication? Yes ?Does the patient have >5 day gap between last  estimated fill dates? No ? ?Care Gaps: ?AWV - previous message to Ramond Craver ?Last BP - 120/62 on 05/31/2021 ?Last A1C - 6.1 on 01/02/2021 ?Shingrix - never done ?Covid vaccine - overdue ?TDAP - overdue ?  ?Star Rating Drugs: ?Lovastatin 20 mg - last filled 09/06/2021 90 DS at [facility] ? ?  Gennie Alma CMA  ?Clinical Pharmacist Assistant ?[phone] ? ?

## 2021-12-09 ENCOUNTER — Telehealth: Payer: Self-pay | Admitting: Family Medicine

## 2021-12-09 MED ORDER — TRAMADOL HCL 50 MG PO TABS: 50.0000 mg | ORAL_TABLET | Freq: Four times a day (QID) | ORAL | 0 refills | Status: AC | PRN

## 2021-12-09 NOTE — Telephone Encounter (Signed)
Tramadol refill sent 

## 2021-12-09 NOTE — Telephone Encounter (Signed)
Pt wife is calling and needs a refill on tramadol  ?[id number]  [phone] ?Fax:  [phone] ?  ? ?

## 2021-12-11 IMAGING — DX DG SHOULDER 2+V*R*
3 series · 3 of 3 positions shown · non-contrast
Comparison: None.

CLINICAL DATA: Shoulder pain.

EXAM:
RIGHT SHOULDER - 2+ VIEW

[shoulder ap (1 of 2)]
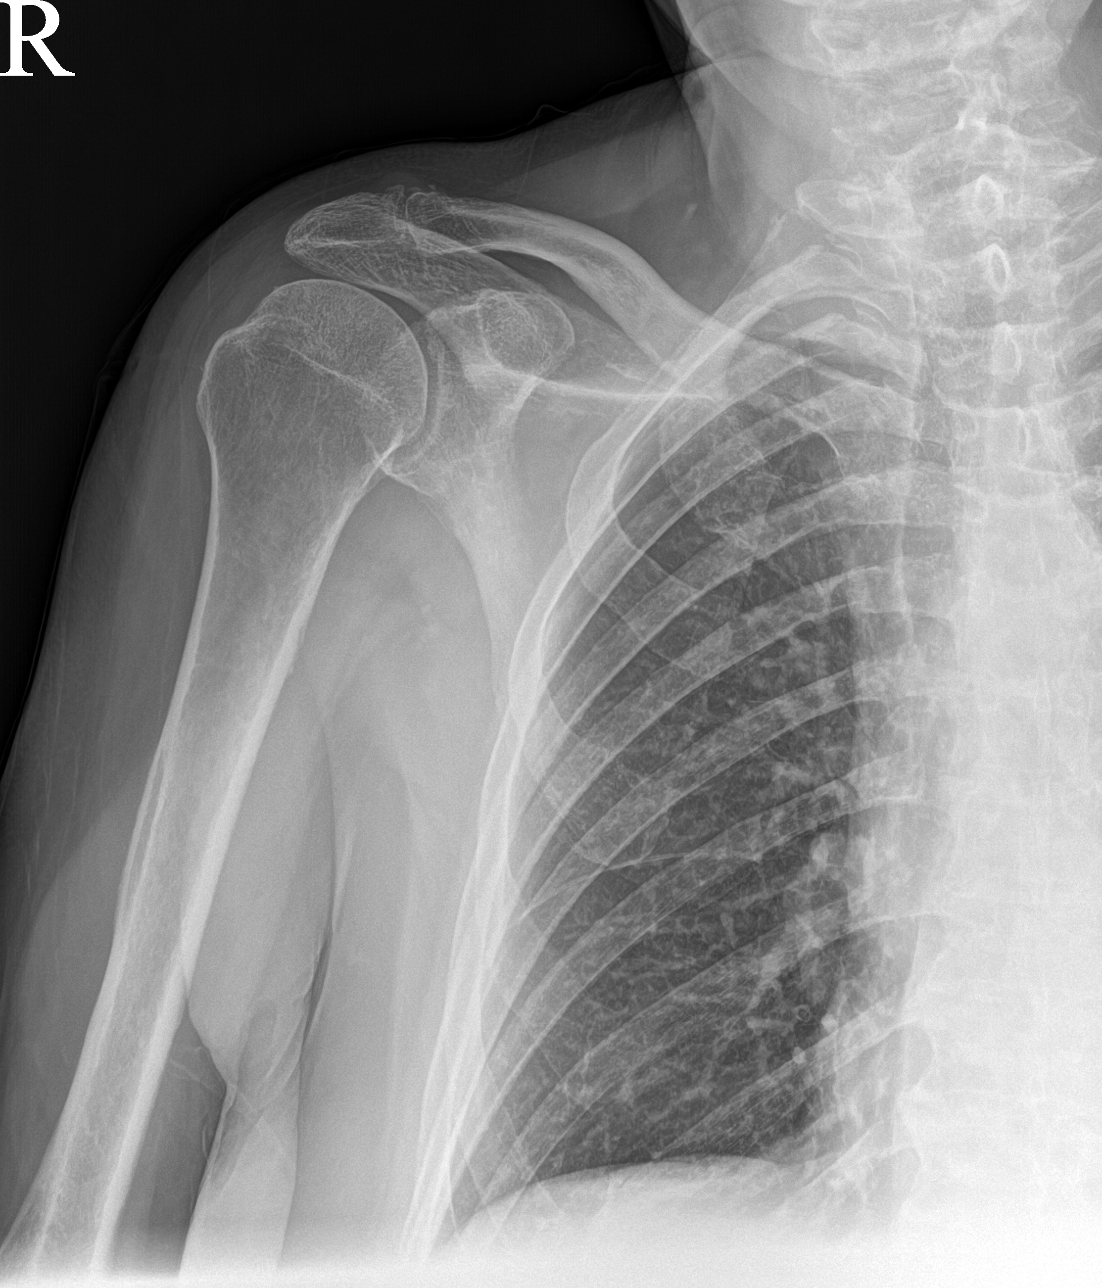

[shoulder ap (2 of 2)]
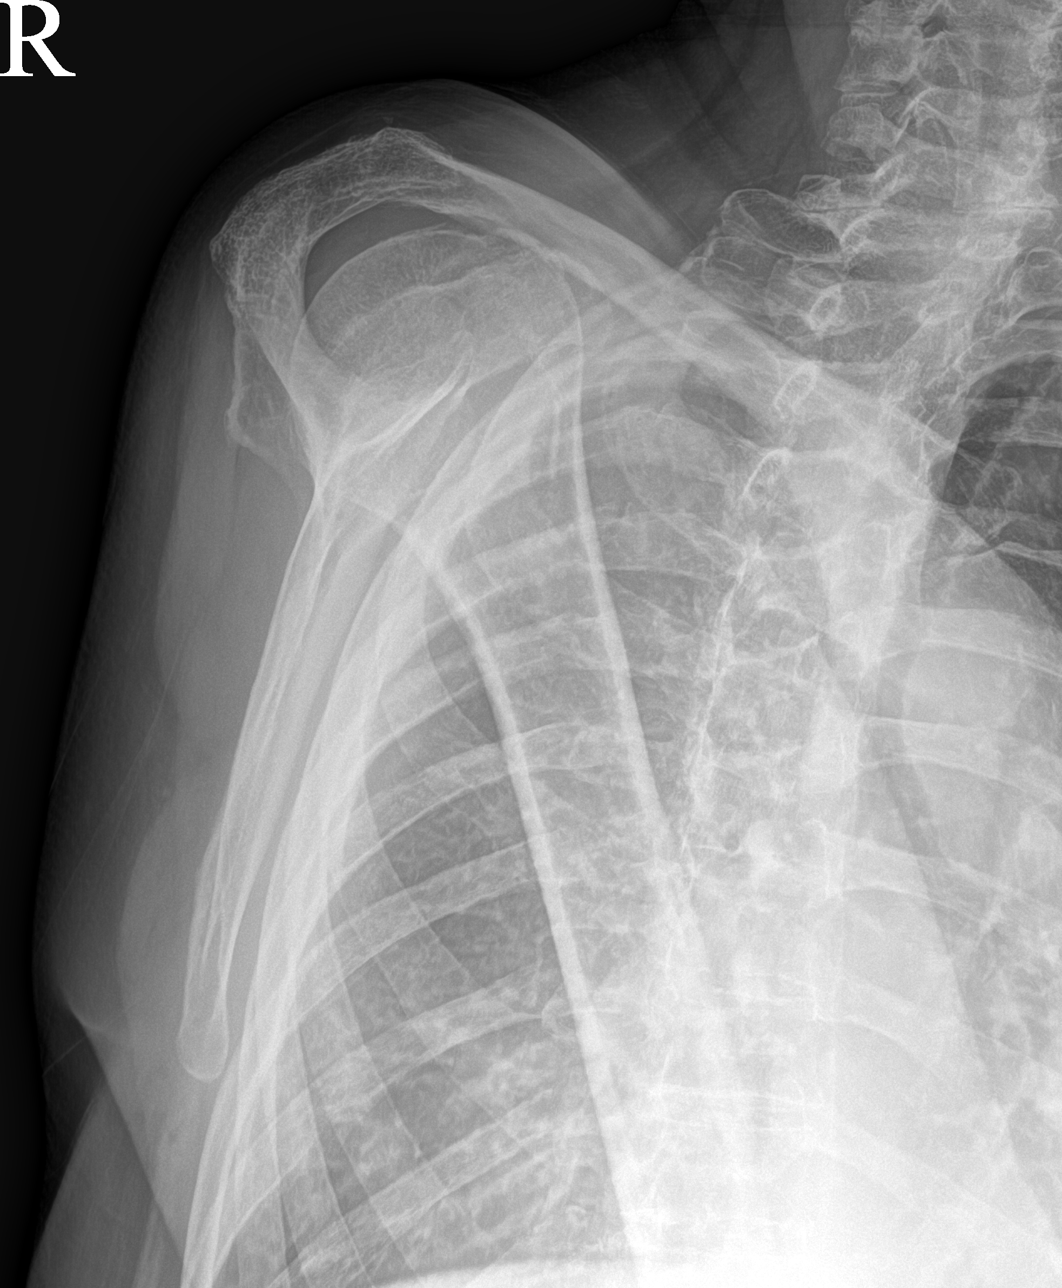

[shoulder axial]
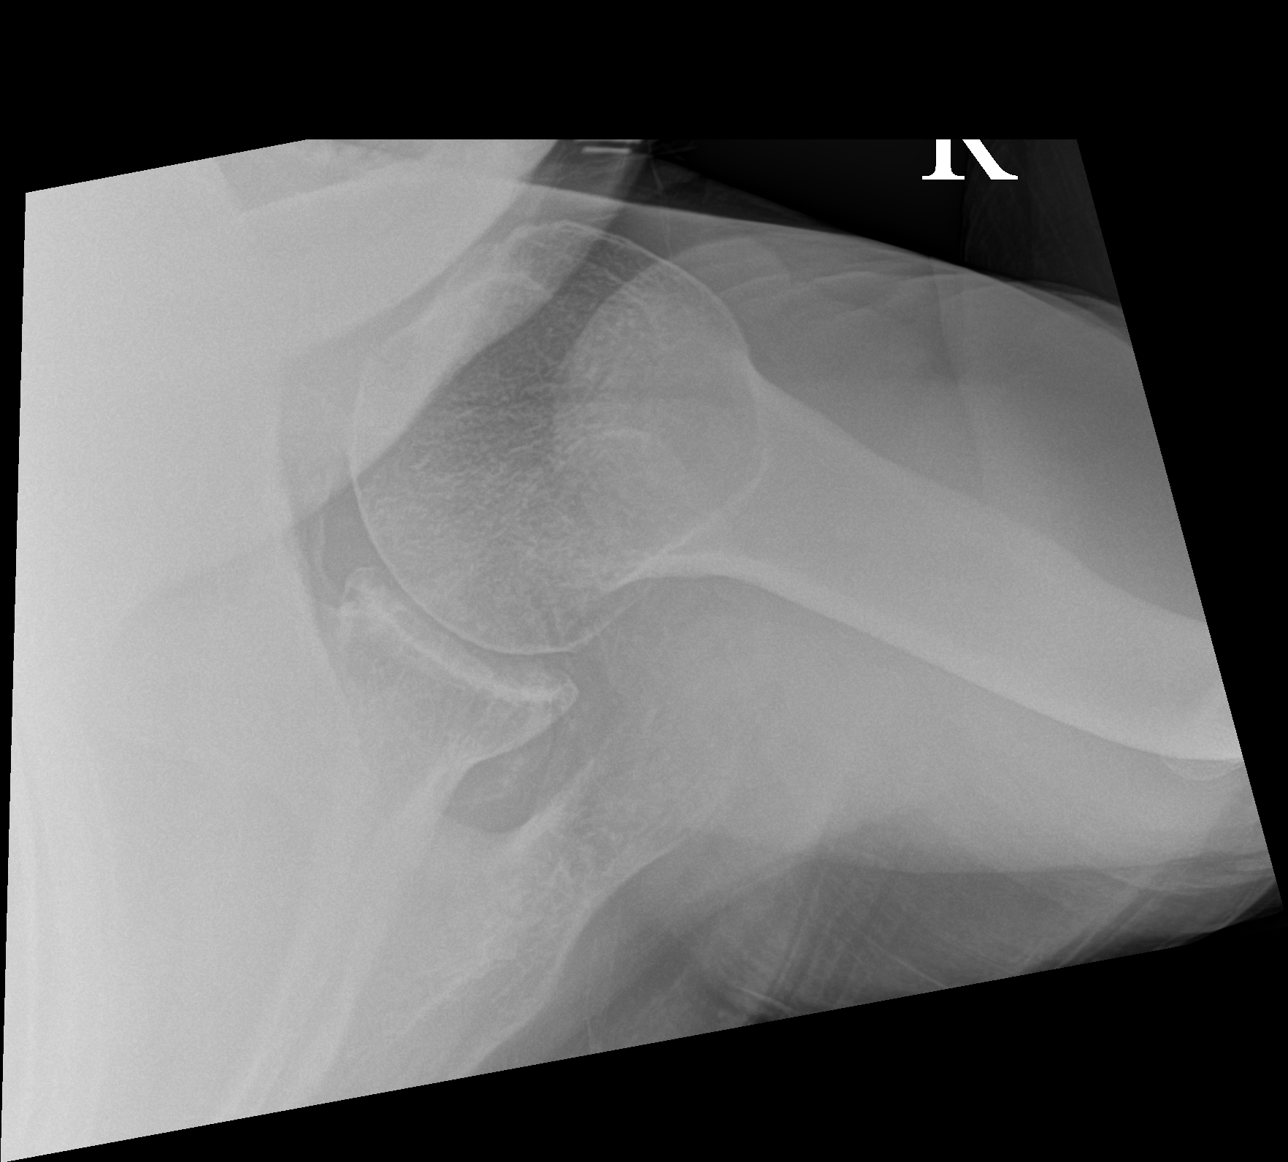

[3 of 3 positions shown; findings below may reference images not displayed]

FINDINGS: There is no evidence of fracture or dislocation. Degenerative
changes noted in the acromioclavicular joint. No worrisome lytic or
sclerotic osseous abnormality. Soft tissues are unremarkable.
IMPRESSION: Mild AC joint degenerative changes.  No acute bony abnormality.

## 2021-12-25 ENCOUNTER — Encounter: Payer: Self-pay | Admitting: Family Medicine

## 2021-12-25 ENCOUNTER — Ambulatory Visit (INDEPENDENT_AMBULATORY_CARE_PROVIDER_SITE_OTHER): Payer: Medicare (Managed Care) | Admitting: Family Medicine

## 2021-12-25 VITALS — BP 134/66 | HR 83 | Temp 97.3°F | Ht 74.0 in | Wt 190.4 lb

## 2021-12-25 DIAGNOSIS — Z79899 Other long term (current) drug therapy: Secondary | ICD-10-CM | POA: Diagnosis not present

## 2021-12-25 DIAGNOSIS — Z5181 Encounter for therapeutic drug level monitoring: Secondary | ICD-10-CM

## 2021-12-25 DIAGNOSIS — R600 Localized edema: Secondary | ICD-10-CM

## 2021-12-25 DIAGNOSIS — L719 Rosacea, unspecified: Secondary | ICD-10-CM | POA: Diagnosis not present

## 2021-12-25 DIAGNOSIS — R059 Cough, unspecified: Secondary | ICD-10-CM | POA: Diagnosis not present

## 2021-12-25 DIAGNOSIS — M48061 Spinal stenosis, lumbar region without neurogenic claudication: Secondary | ICD-10-CM | POA: Diagnosis not present

## 2021-12-25 MED ORDER — TRAMADOL HCL 50 MG PO TABS: 50.0000 mg | ORAL_TABLET | Freq: Four times a day (QID) | ORAL | 0 refills | Status: DC | PRN

## 2021-12-25 MED ORDER — METRONIDAZOLE 0.75 % EX CREA
TOPICAL_CREAM | Freq: Every day | CUTANEOUS | 2 refills | Status: AC
Start: 2021-12-25 — End: ?

## 2021-12-25 NOTE — Progress Notes (Signed)
? ?Established Patient Office Visit ? ?Subjective   ?Patient ID: [name], male    DOB: 1932/07/03  Age: 88 y.o. MRN: [id number] ? ?Chief Complaint  ?Patient presents with  ? Follow-up  ? Foot Swelling  ? Wheezing  ? ? ?HPI ? ? ?Patient accompanied by wife today to discuss several things as follows.  His chronic problems include history of atrial fibrillation, hypertension, GERD, rosacea, multiple skin cancers, chronic kidney disease stage III, BPH, spinal stenosis lumbar region with neurogenic claudication.  He has had several months now of right lower extremity pain which has been very severe and incapacitating at times.  He tried some prednisone without much improvement.  He is currently taking gabapentin 3 times daily and supplements with tramadol as needed.  He has seen neurosurgeon in the past but has had some difficulty getting back in.  Has benefited some from injections in the past ? ?He does have some leg edema right greater than left.  Has taken furosemide intermittently in the past.  Elevate some.  Has used some compression. ? ?Wife has noted that he has some intermittent coughing and possibly some wheezing.  Does not spend much time outdoors.  He has had some chronic edema right leg greater than left.  No orthopnea.  No dyspnea at rest.  No right calf pain. ? ?He has rosacea and is requesting refill of metronidazole cream.  Rosacea seems to be overall fairly stable. ? ?Past Medical History:  ?Diagnosis Date  ? Actinic keratosis 03/13/2009  ? Arthritis   ? BENIGN PROSTATIC HYPERTROPHY, HX OF 04/07/2009  ? CARCINOMA, SKIN, SQUAMOUS CELL 09/11/2009  ? COLONIC POLYPS, HX OF 03/13/2009  ? Essential hypertension   ? GERD 03/13/2009  ? Hyperlipidemia   ? INSOMNIA, CHRONIC 09/11/2009  ? Persistent atrial fibrillation ([place]) 04/07/2009  ? a. s/p afib ablation at [facility] x 2 in 2010.  ? POLYPECTOMY, HX OF 04/07/2009  ? Premature atrial contractions   ? PULMONARY NODULE 09/11/2009  ? PVC's (premature ventricular  contractions)   ? Rosacea 03/13/2009  ? SKIN CANCER, HX OF 03/13/2009  ? ?Past Surgical History:  ?Procedure Laterality Date  ? ABLATION  2010  ? x2  ? COLONOSCOPY W/ BIOPSIES AND POLYPECTOMY    ? EYE SURGERY Bilateral   ? cataracts  ? LUMBAR DISC SURGERY    ? RUPTURE  ? LUMBAR LAMINECTOMY/DECOMPRESSION MICRODISCECTOMY N/A 04/20/2014  ? Procedure: LUMBAR TWO-THREE, LUMBAR THREE-FOUR, LUMAR FOUR-FIVE LAMINECTOMIES;  Surgeon: Newman Pies, MD;  Location: [place];  Service: Neurosurgery;  Laterality: N/A;  ? POLYPECTOMY    ? SKIN CANCER EXCISION    ? ? reports that he quit smoking about 33 years ago. His smoking use included cigarettes. He has a 30.00 pack-year smoking history. His smokeless tobacco use includes chew. He reports that he does not drink alcohol and does not use drugs. ?family history includes CAD in his brother, brother, and sister; Cancer in his mother; Cancer (age of onset: 4) in his father; Heart attack in his father; Heart disease in his brother, brother, father, and sister. ?Allergies  ?Allergen Reactions  ? Amoxicillin-Pot Clavulanate Diarrhea and Nausea Only  ? Losartan Other (See Comments) and Nausea And Vomiting  ?  Elevated creatinine levels ?Elevated creatinine levels  ? ? ?Review of Systems  ?Constitutional:  Negative for chills and fever.  ?Respiratory:  Positive for cough and wheezing. Negative for hemoptysis and shortness of breath.   ?Cardiovascular:  Positive for leg swelling. Negative for chest  pain and palpitations.  ? ?  ?Objective:  ?  ? ?BP 134/66 (BP Location: Left Arm, Patient Position: Sitting, Cuff Size: Normal)   Pulse 83   Temp (!) 97.3 ?F (36.3 ?C) (Oral)   Ht '6\' 2"'$  (1.88 m)   Wt 190 lb 6.4 oz (86.4 kg)   SpO2 98%   BMI 24.45 kg/m?  ? ? ?Physical Exam ?Vitals reviewed.  ?Constitutional:   ?   Appearance: Normal appearance.  ?Cardiovascular:  ?   Rate and Rhythm: Normal rate.  ?Pulmonary:  ?   Effort: Pulmonary effort is normal.  ?   Breath sounds: Normal breath  sounds.  ?Musculoskeletal:  ?   Comments: No significant edema left lower extremity.  He has trace to 1+ pitting edema right lower leg ankle and foot.  They state this is relatively chronic and asymmetric.  No calf pain.  ?Neurological:  ?   Mental Status: He is alert.  ? ? ? ?No results found for any visits on 12/25/21. ? ? ? ?The ASCVD Risk score (Arnett DK, et al., 2019) failed to calculate for the following reasons: ?  The 2019 ASCVD risk score is only valid for ages 35 to 21 ? ?  ?Assessment & Plan:  ? ?#1 longstanding history of lumbar stenosis with neurogenic claudication and recent increased right radiculitis symptoms.  Followed by neurosurgery.  Currently on gabapentin 300 mg 3 times daily and supplements with tramadol as needed.  Overall stable at this time. ?Has pending follow-up with neurosurgery ?-Refill tramadol for as needed use. ? ?#2 chronic lower extremity edema.  Likely multifactorial.  Gabapentin certainly could be contributing.  Edema relatively mild at this time and weight is actually down about 12 pounds from last October. ?-Continue furosemide as needed ?-Check basic metabolic panel ?-Elevate legs frequently ?-Consider compression lower extremities ? ?#3 rosacea history.  Requesting refills of metro-cream and this was sent. ? ?#4 intermittent cough.  Question allergic.  Nonfocal exam.  Follow-up immediately for any fever, orthopnea, rapid weight gain, or increased dyspnea. ? ? ?No follow-ups on file.  ? ? ?Carolann Littler, MD ? ?

## 2021-12-26 LAB — BASIC METABOLIC PANEL
BUN: 28 mg/dL — ABNORMAL HIGH (ref 6–23)
CO2: 30 mEq/L (ref 19–32)
Calcium: 9.5 mg/dL (ref 8.4–10.5)
Chloride: 100 mEq/L (ref 96–112)
Creatinine, Ser: 1.53 mg/dL — ABNORMAL HIGH (ref 0.40–1.50)
GFR: 40.53 mL/min — ABNORMAL LOW (ref >60.00)
Glucose, Bld: 103 mg/dL — ABNORMAL HIGH (ref 70–99)
Potassium: 4.3 mEq/L (ref 3.5–5.1)
Sodium: 140 mEq/L (ref 135–145)

## 2022-02-03 ENCOUNTER — Telehealth: Payer: Medicare (Managed Care)

## 2022-02-13 ENCOUNTER — Other Ambulatory Visit: Payer: Self-pay | Admitting: Family Medicine

## 2022-02-17 ENCOUNTER — Ambulatory Visit: Payer: Medicare (Managed Care) | Admitting: Family Medicine

## 2022-02-18 ENCOUNTER — Ambulatory Visit (INDEPENDENT_AMBULATORY_CARE_PROVIDER_SITE_OTHER): Payer: Medicare (Managed Care) | Admitting: Family Medicine

## 2022-02-18 ENCOUNTER — Encounter: Payer: Self-pay | Admitting: Family Medicine

## 2022-02-18 VITALS — BP 116/66 | HR 79 | Temp 97.6°F | Ht 74.0 in | Wt 193.9 lb

## 2022-02-18 DIAGNOSIS — R009 Unspecified abnormalities of heart beat: Secondary | ICD-10-CM

## 2022-02-18 DIAGNOSIS — I4891 Unspecified atrial fibrillation: Secondary | ICD-10-CM | POA: Diagnosis not present

## 2022-02-18 DIAGNOSIS — M792 Neuralgia and neuritis, unspecified: Secondary | ICD-10-CM | POA: Diagnosis not present

## 2022-02-18 DIAGNOSIS — R000 Tachycardia, unspecified: Secondary | ICD-10-CM

## 2022-02-20 NOTE — Progress Notes (Addendum)
Cardiology Office Note:    Date:  02/21/2022   ID:  [name] 05-Aug-1932, MRN [id number]  PCP:  [name], MD  Cardiologist:  Fransico Him, MD    Referring MD: [name], MD   Chief Complaint  Patient presents with   Atrial Fibrillation   Hypertension   Hyperlipidemia    History of Present Illness:    [name] is a 88 y.o. male with a hx of persistent atrial fibrillation s/p afib ablation at [facility] x 2 in 2010, HTN, hyperlipidemia, PACs/PVCs (by monitor 2017), CKD stage III.   He has a history of prior atypical chest pain and negative stress test. Patient was having significant chest pain that had been intermittent for the last year, but had recently become more severe. Due to concern for unstable angina he was admitted to the hospital. Troponins were negative. 2D echo 04/08/19 showed EF 55-60%, no significant abnormalities. Outpatient nuc was arranged 04/15/19 which was normal, EF 55%.   He is here today for followup and is doing well.  He denies any chest pain or pressure, SOB, DOE, PND, orthopnea, LE edema,  palpitations or syncope.  He has chronic lightheadedness from his gabapentin. He saw his PCP yesterday and was found to be back in afib with RVR at 125bpm and his Toprol was increased to '50mg'$  daily. He is compliant with his meds and is tolerating meds with no SE.     Past Medical History:  Diagnosis Date   Actinic keratosis 03/13/2009   Arthritis    BENIGN PROSTATIC HYPERTROPHY, HX OF 04/07/2009   CARCINOMA, SKIN, SQUAMOUS CELL 09/11/2009   COLONIC POLYPS, HX OF 03/13/2009   Essential hypertension    GERD 03/13/2009   Hyperlipidemia    INSOMNIA, CHRONIC 09/11/2009   Persistent atrial fibrillation ([place]) 04/07/2009   a. s/p afib ablation at [facility] x 2 in 2010.   POLYPECTOMY, HX OF 04/07/2009   Premature atrial contractions    PULMONARY NODULE 09/11/2009   PVC's (premature ventricular contractions)    Rosacea 03/13/2009   SKIN CANCER, HX OF  03/13/2009    Past Surgical History:  Procedure Laterality Date   ABLATION  2010   x2   COLONOSCOPY W/ BIOPSIES AND POLYPECTOMY     EYE SURGERY Bilateral    cataracts   LUMBAR DISC SURGERY     RUPTURE   LUMBAR LAMINECTOMY/DECOMPRESSION MICRODISCECTOMY N/A 04/20/2014   Procedure: LUMBAR TWO-THREE, LUMBAR THREE-FOUR, LUMAR FOUR-FIVE LAMINECTOMIES;  Surgeon: Newman Pies, MD;  Location: [place] [facility];  Service: Neurosurgery;  Laterality: N/A;   POLYPECTOMY     SKIN CANCER EXCISION      Current Medications: Current Meds  Medication Sig   acetaminophen (TYLENOL) 500 MG tablet Take 500 mg by mouth every 6 (six) hours as needed for mild pain.   amLODipine (NORVASC) 2.5 MG tablet Take 1 tablet (2.5 mg total) by mouth daily.   apixaban (ELIQUIS) 5 MG TABS tablet Take 1 tablet (5 mg total) by mouth 2 (two) times daily.   budesonide-formoterol (SYMBICORT) 160-4.5 MCG/ACT inhaler TAKE 2 PUFFS BY MOUTH TWICE A DAY   clobetasol ointment (TEMOVATE) 6.29 % Apply 1 application topically 2 (two) times daily as needed (Rash).    finasteride (PROSCAR) 5 MG tablet Take 1 tablet (5 mg total) by mouth daily.   furosemide (LASIX) 40 MG tablet TAKE ONE TABLET DAILY BY MOUTH AS NEEDED FOR LEG EDEMA   gabapentin (NEURONTIN) 300 MG capsule Take 1 capsule (300 mg  total) by mouth 3 (three) times daily.   glucosamine-chondroitin 500-400 MG tablet Take 2 tablets by mouth daily.   lansoprazole (PREVACID) 30 MG capsule TAKE 1 CAPSULE EVERY DAY AT 12 NOON. Please make yearly appt with Dr. Radford Pax for March 2023 for future refills. Thank you 1st attempt   lovastatin (MEVACOR) 20 MG tablet TAKE 1 TABLET AT BEDTIME   metoprolol succinate (TOPROL-XL) 25 MG 24 hr tablet Take 50 mg by mouth daily.   metroNIDAZOLE (METROCREAM) 0.75 % cream Apply topically daily.   nitroGLYCERIN (NITROSTAT) 0.4 MG SL tablet Place 1 tablet (0.4 mg total) under the tongue every 5 (five) minutes x 3 doses as needed for chest pain.   potassium  chloride (KLOR-CON 10) 10 MEQ tablet Take two tablets by mouth once daily   tamsulosin (FLOMAX) 0.4 MG CAPS capsule Take 1 capsule (0.4 mg total) by mouth daily.   vitamin B-12 (CYANOCOBALAMIN) 500 MCG tablet Take 500 mcg by mouth daily.   VITAMIN D PO Take 400 Units by mouth daily.      Allergies:   Amoxicillin-pot clavulanate and Losartan   Social History   Socioeconomic History   Marital status: Married    Spouse name: Not on file   Number of children: Not on file   Years of education: Not on file   Highest education level: Not on file  Occupational History   Not on file  Tobacco Use   Smoking status: Former    Packs/day: 1.00    Years: 30.00    Total pack years: 30.00    Types: Cigarettes    Quit date: 03/13/1988    Years since quitting: 33.9   Smokeless tobacco: Current    Types: Chew  Substance and Sexual Activity   Alcohol use: No   Drug use: No   Sexual activity: Not on file  Other Topics Concern   Not on file  Social History Narrative   Not on file   Social Determinants of Health   Financial Resource Strain: Low Risk  (04/18/2020)   Overall Financial Resource Strain (CARDIA)    Difficulty of Paying Living Expenses: Not very hard  Food Insecurity: Not on file  Transportation Needs: No Transportation Needs (04/18/2020)   PRAPARE - Transportation    Lack of Transportation (Medical): No    Lack of Transportation (Non-Medical): No  Physical Activity: Not on file  Stress: Not on file  Social Connections: Not on file     Family History: The patient's family history includes CAD in his brother, brother, and sister; Cancer in his mother; Cancer (age of onset: 50) in his father; Heart attack in his father; Heart disease in his brother, brother, father, and sister.  ROS:   Please see the history of present illness.    ROS  All other systems reviewed and negative.   EKGs/Labs/Other Studies Reviewed:    The following studies were reviewed today: Prior OV  notes  EKG:  EKG from PCP office yesterday showed atrial fibrillation with RVR at 125bpm .  Repeat EKG today shows sinus rhythm at 64 bpm with no ST changes  Recent Labs: 05/13/2021: Pro B Natriuretic peptide (BNP) 130.0 12/25/2021: BUN 28; Creatinine, Ser 1.53; Potassium 4.3; Sodium 140   Recent Lipid Panel    Component Value Date/Time   CHOL 131 11/15/2020 0918   TRIG 138 11/15/2020 0918   HDL 33 (L) 11/15/2020 0918   CHOLHDL 4.0 11/15/2020 0918   CHOLHDL 5 09/19/2020 1418   VLDL 57.6 (H)  09/19/2020 1418   LDLCALC 74 11/15/2020 0918   LDLDIRECT 91.0 09/19/2020 1418    Physical Exam:    VS:  BP 130/78   Pulse 65   Ht '6\' 2"'$  (1.88 m)   Wt 194 lb (88 kg)   SpO2 99%   BMI 24.91 kg/m     Wt Readings from Last 3 Encounters:  02/21/22 194 lb (88 kg)  02/18/22 193 lb 14.4 oz (88 kg)  12/25/21 190 lb 6.4 oz (86.4 kg)     GEN: Well nourished, well developed in no acute distress HEENT: Normal NECK: No JVD; No carotid bruits LYMPHATICS: No lymphadenopathy CARDIAC:RRR, no murmurs, rubs, gallops RESPIRATORY:  Clear to auscultation without rales, wheezing or rhonchi  ABDOMEN: Soft, non-tender, non-distended MUSCULOSKELETAL:  1+ RLE edema; No deformity  SKIN: Warm and dry NEUROLOGIC:  Alert and oriented x 3 PSYCHIATRIC:  Normal affect    ASSESSMENT:    1. Paroxysmal atrial fibrillation (HCC)   2. Atypical chest pain   3. Essential hypertension   4. Pure hypercholesterolemia    PLAN:    In order of problems listed above:  1.  PAF -he was found to be back in afib with RVR yesterday but was unaware.  HR was 125bpm and Toprol was increased to '50mg'$  daily by PCP.  He is now back in normal sinus rhythm at 64 bpm.  He is unaware when he is in A-fib. -continue prescription drug management withToprol XL '50mg'$  daily with PRN refills -he had been on Eliquis 2.'5mg'$  BID but SCr had improved and then increased to '5mg'$  BID but now last SCR was 1.5 and age is >80.   -I have personally  reviewed and interpreted outside labs performed by patient's PCP which showed SCR 1.53, K+ 4.3 from 12/2021 -Check CBC  -check BMET today and adjust Eliquis as needed  2.  Atypical chest pain -felt to be GI related -He denies any episodes of chest discomfrot -he has chronic DOE when walking to the mailbox related to problems with severe back pain that is chronic and stable -nuclear stress test showed no ischemia 8/'2020  3.  HTN -BP controlled today -continue prescription drug management with Amlodipine 2.'5mg'$  daily and Toprol XL '50mg'$  daily with PRN refills.  4.  HLD -LDL goal < 100 -Continue prescription drug management with Atorvastatin '20mg'$  daily with PRN refills -followed by PCP   Medication Adjustments/Labs and Tests Ordered: Current medicines are reviewed at length with the patient today.  Concerns regarding medicines are outlined above.  No orders of the defined types were placed in this encounter.  No orders of the defined types were placed in this encounter.   Signed, Fransico Him, MD  02/21/2022 9:04 AM    [place]

## 2022-02-21 ENCOUNTER — Encounter: Payer: Self-pay | Admitting: Cardiology

## 2022-02-21 ENCOUNTER — Ambulatory Visit (INDEPENDENT_AMBULATORY_CARE_PROVIDER_SITE_OTHER): Payer: Medicare (Managed Care) | Admitting: Cardiology

## 2022-02-21 VITALS — BP 130/78 | HR 65 | Ht 74.0 in | Wt 194.0 lb

## 2022-02-21 DIAGNOSIS — E7800 Pure hypercholesterolemia, unspecified: Secondary | ICD-10-CM

## 2022-02-21 DIAGNOSIS — I10 Essential (primary) hypertension: Secondary | ICD-10-CM | POA: Diagnosis not present

## 2022-02-21 DIAGNOSIS — R0789 Other chest pain: Secondary | ICD-10-CM | POA: Diagnosis not present

## 2022-02-21 DIAGNOSIS — I480 Paroxysmal atrial fibrillation: Secondary | ICD-10-CM | POA: Diagnosis not present

## 2022-02-21 MED ORDER — METOPROLOL SUCCINATE ER 50 MG PO TB24: 50.0000 mg | ORAL_TABLET | Freq: Every day | ORAL | 3 refills | Status: DC

## 2022-02-21 NOTE — Addendum Note (Signed)
Addended by: Antonieta Iba on: 02/21/2022 12:55 PM   Modules accepted: Orders

## 2022-02-21 NOTE — Addendum Note (Signed)
Addended by: Antonieta Iba on: 02/21/2022 09:23 AM   Modules accepted: Orders

## 2022-02-21 NOTE — Addendum Note (Signed)
Addended by: Antonieta Iba on: 02/21/2022 09:21 AM   Modules accepted: Orders

## 2022-02-21 NOTE — Patient Instructions (Signed)
Medication Instructions:  Your physician recommends that you continue on your current medications as directed. Please refer to the Current Medication list given to you today.  *If you need a refill on your cardiac medications before your next appointment, please call your pharmacy*   Lab Work: TODAY: CBC and BMET If you have labs (blood work) drawn today and your tests are completely normal, you will receive your results only by: [place] (if you have MyChart) OR A paper copy in the mail If you have any lab test that is abnormal or we need to change your treatment, we will call you to review the results.  Follow-Up: At [facility], you and your health needs are our priority.  As part of our continuing mission to provide you with exceptional heart care, we have created designated Provider Care Teams.  These Care Teams include your primary Cardiologist (physician) and Advanced Practice Providers (APPs -  Physician Assistants and Nurse Practitioners) who all work together to provide you with the care you need, when you need it.  Your next appointment:   6 month(s)  The format for your next appointment:   In Person  Provider:   Fransico Him, MD    Important Information About Sugar

## 2022-02-22 LAB — CBC
Hematocrit: 40.1 % (ref 37.5–51.0)
Hemoglobin: 13.2 g/dL (ref 13.0–17.7)
MCH: 32.6 pg (ref 26.6–33.0)
MCHC: 32.9 g/dL (ref 31.5–35.7)
MCV: 99 fL — ABNORMAL HIGH (ref 79–97)
Platelets: 235 10*3/uL (ref 150–450)
RBC: 4.05 x10E6/uL — ABNORMAL LOW (ref 4.14–5.80)
RDW: 14.2 % (ref 11.6–15.4)
WBC: 7.9 10*3/uL (ref 3.4–10.8)

## 2022-02-22 LAB — BASIC METABOLIC PANEL
BUN/Creatinine Ratio: 20 (ref 10–24)
BUN: 27 mg/dL (ref 8–27)
CO2: 26 mmol/L (ref 20–29)
Calcium: 9.3 mg/dL (ref 8.6–10.2)
Chloride: 100 mmol/L (ref 96–106)
Creatinine, Ser: 1.36 mg/dL — ABNORMAL HIGH (ref 0.76–1.27)
Glucose: 88 mg/dL (ref 70–99)
Potassium: 4.2 mmol/L (ref 3.5–5.2)
Sodium: 143 mmol/L (ref 134–144)
eGFR: 50 mL/min/{1.73_m2} — ABNORMAL LOW (ref >59)

## 2022-02-24 ENCOUNTER — Telehealth: Payer: Self-pay | Admitting: Family Medicine

## 2022-02-24 ENCOUNTER — Telehealth: Payer: Self-pay

## 2022-02-24 DIAGNOSIS — R0789 Other chest pain: Secondary | ICD-10-CM

## 2022-02-24 DIAGNOSIS — I480 Paroxysmal atrial fibrillation: Secondary | ICD-10-CM

## 2022-02-24 DIAGNOSIS — M48062 Spinal stenosis, lumbar region with neurogenic claudication: Secondary | ICD-10-CM

## 2022-02-24 NOTE — Telephone Encounter (Signed)
-----   Message from Sueanne Margarita, MD sent at 02/24/2022 10:25 AM EDT ----- Stable BMET - continue on Eliquis '5mg'$  BID - repeat BMET in 3 months

## 2022-02-24 NOTE — Telephone Encounter (Signed)
Diane from [place] [phone]) called to see if possible for Pt to get a prescription for a Rollator with seat -   IF possible, please send to [facility].

## 2022-02-26 NOTE — Telephone Encounter (Signed)
Order generated

## 2022-02-26 NOTE — Telephone Encounter (Signed)
Order faxed to [facility] at [phone].

## 2022-02-28 ENCOUNTER — Telehealth: Payer: Self-pay | Admitting: Family Medicine

## 2022-02-28 NOTE — Telephone Encounter (Signed)
Pt's wife called to check on status of Rollator. I gave her the number to [facility], as listed in previous note.

## 2022-02-28 NOTE — Telephone Encounter (Signed)
Noted  

## 2022-03-04 NOTE — Telephone Encounter (Signed)
Error. Please disregard

## 2022-03-05 ENCOUNTER — Other Ambulatory Visit: Payer: Self-pay | Admitting: Family Medicine

## 2022-03-05 NOTE — Telephone Encounter (Signed)
Last OV- 02/18/22  No future OV scheduled.

## 2022-03-11 ENCOUNTER — Other Ambulatory Visit: Payer: Self-pay | Admitting: Family Medicine

## 2022-03-12 DIAGNOSIS — L570 Actinic keratosis: Secondary | ICD-10-CM | POA: Diagnosis not present

## 2022-03-12 DIAGNOSIS — D485 Neoplasm of uncertain behavior of skin: Secondary | ICD-10-CM | POA: Diagnosis not present

## 2022-03-12 DIAGNOSIS — C44222 Squamous cell carcinoma of skin of right ear and external auricular canal: Secondary | ICD-10-CM | POA: Diagnosis not present

## 2022-03-12 DIAGNOSIS — C44622 Squamous cell carcinoma of skin of right upper limb, including shoulder: Secondary | ICD-10-CM | POA: Diagnosis not present

## 2022-03-14 ENCOUNTER — Other Ambulatory Visit: Payer: Self-pay | Admitting: Family Medicine

## 2022-03-20 ENCOUNTER — Other Ambulatory Visit: Payer: Self-pay | Admitting: Family Medicine

## 2022-04-09 ENCOUNTER — Other Ambulatory Visit: Payer: Self-pay | Admitting: Family Medicine

## 2022-04-09 DIAGNOSIS — R600 Localized edema: Secondary | ICD-10-CM

## 2022-04-11 ENCOUNTER — Telehealth: Payer: Self-pay | Admitting: Pharmacist

## 2022-04-11 NOTE — Chronic Care Management (AMB) (Signed)
    Chronic Care Management Pharmacy Assistant   Name: [name]  MRN: [id number] DOB: Jun 18, 1932  04/14/2022 APPOINTMENT REMINDER  Called Pecola Leisure, No answer, left message of appointment on 04/14/2022 at 3:00 via telephone visit with Jeni Salles, Pharm D. Notified to have all medications, supplements, blood pressure and/or blood sugar logs available during appointment and to return call if need to reschedule.  Care Gaps: AWV - previous message to Ramond Craver Last BP - 130/78 on 02/21/2022 Last A1C - 6.1 on 01/02/2021 Shingrix - never done Covid booster - overdue Tdap - overdue Flu - due  Star Rating Drug: Lovastatin 20 mg - last filled 01/31/2022 90 DS at [facility]  Any gaps in medications fill history? No  G[facility]ie [facility]  Catering manager [phone]

## 2022-04-14 ENCOUNTER — Ambulatory Visit (INDEPENDENT_AMBULATORY_CARE_PROVIDER_SITE_OTHER): Payer: Medicare (Managed Care) | Admitting: Pharmacist

## 2022-04-14 DIAGNOSIS — N401 Enlarged prostate with lower urinary tract symptoms: Secondary | ICD-10-CM

## 2022-04-14 DIAGNOSIS — I10 Essential (primary) hypertension: Secondary | ICD-10-CM

## 2022-04-14 NOTE — Patient Instructions (Signed)
[name],  It was great to catch up again! Don't forget to weigh yourself at home regularly to assess your fluid build up and make sure the furosemide is working properly.  Please reach out to me if you have any questions or need anything before our follow up!  Best, Maddie  Jeni Salles, PharmD, [name]   Visit Information   Goals Addressed   None    Patient Care Plan: CCM Pharmacy Care Plan     Problem Identified: Problem: Hypertension, Hyperlipidemia, Atrial Fibrillation, GERD, Chronic Kidney Disease, and BPH      Long-Range Goal: Patient-Specific Goal   Start Date: 09/11/2021  Expected End Date: 09/11/2022  Recent Progress: On track  Priority: High  Note:   Current Barriers:  Unable to independently afford treatment regimen Unable to independently monitor therapeutic efficacy  Pharmacist Clinical Goal(s):  Patient will verbalize ability to afford treatment regimen achieve adherence to monitoring guidelines and medication adherence to achieve therapeutic efficacy through collaboration with PharmD and provider.   Interventions: 1:1 collaboration with [name], MD regarding development and update of comprehensive plan of care as evidenced by provider attestation and co-signature Inter-disciplinary care team collaboration (see longitudinal plan of care) Comprehensive medication review performed; medication list updated in electronic medical record  Hypertension (BP goal <140/90) -Controlled -Current treatment: Metoprolol succinate 50 mg 1 tablet daily in PM - Appropriate, Effective, Safe, Accessible Amlodipine 2.5 mg 1 tablet daily in AM - Appropriate, Effective, Safe, Accessible -Medications previously tried: unknown  -Current home readings: 138/63, 123/68, 135, 128, 141/73; wife checks sometimes -Current dietary habits: did not discuss -Current exercise habits: walking  sometimes but limited with back pain -Reports hypotensive/hypertensive symptoms -Educated on BP goals and benefits of medications for prevention of heart attack, stroke and kidney damage; Importance of home blood pressure monitoring; Proper BP monitoring technique; Symptoms of hypotension and importance of maintaining adequate hydration; -Counseled to monitor BP at home weekly, document, and provide log at future appointments -Recommended to continue current medication  Hyperlipidemia: (LDL goal < 100) -Controlled -Current treatment: Lovastatin 20 mg 1 tablet daily at bedtime - Appropriate, Effective, Safe, Accessible -Medications previously tried: none  -Current dietary patterns: did not discuss -Current exercise habits: limited with back pain -Educated on Cholesterol goals;  -Recommended to continue current medication  Atrial Fibrillation (Goal: prevent stroke and major bleeding) -Controlled -CHADSVASC: 3 -Current treatment: Rate control: Metoprolol succinate 50 mg 1 tablet daily - Appropriate, Effective, Safe, Accessible Anticoagulation: Eliquis 5 mg 1 tablet twice daily - Appropriate, Effective, Safe, Accessible -Medications previously tried: none -Home BP and HR readings: refer to above; 58, 68, 70, 72 -Counseled on increased risk of stroke due to Afib and benefits of anticoagulation for stroke prevention; -Recommended to continue current medication Counseled on importance of taking Eliquis doses roughly 12 hours apart.  Query COPD (Goal: control symptoms and prevent exacerbations) -Controlled -Current treatment  Symbicort 160-4.5 mcg/act  2 puffs twice daily - Query Appropriate, Effective, Safe, Accessible -Medications previously tried: none  -Gold Grade: unknown -Current COPD Classification:  A (low sx, <2 exacerbations/yr) -MMRC/CAT score: n/a -Pulmonary function testing: none -Exacerbations requiring treatment in last 6 months: none -Patient reports consistent use of  maintenance inhaler -Frequency of rescue inhaler use: does not have one -Counseled on Proper inhaler technique; -Recommended spirometry testing to ensure appropriate inhaler therapy.  BPH (Goal: minimize symptoms) -Controlled -Current treatment  Tamsulosin 0.4 mg 1 capsule twice daily - Appropriate,  Query effective, Query Safe, Accessible Finasteride 5 mg 1 tablet daily - Appropriate, Effective, Safe, Accessible -Medications previously tried: none  -Recommended taking tamsulosin with food to minimize dizziness.  Neuropathy (Goal: minimize pain) -Not ideally controlled -Current treatment  Gabapentin 300 mg 1 tablet twice daily - Appropriate, Query effective, Safe, Accessible Tramadol 50 mg 1 tablet as needed - Appropriate, Query effective, Safe, Accessible -Medications previously tried: none  -Recommended repeat vitamin B12 level given worsening neuropathy.   GERD (Goal: minimize symptoms) -Controlled -Current treatment  Lansoprazole 30 mg daily at noon - Appropriate, Effective, Query Safe, Accessible -Medications previously tried: none  - Reassess the need for this medication at follow up.   Health Maintenance -Vaccine gaps: tetanus, COVID booster -Current therapy:  APAP 500 mg as needed Glucosamine-Chondroitin 2 tablets twice daily Clobetasol 0.05% ointment twice daily PRN  Metronidazole 0.75% cream three times weekly  Nitroglycerin 0.4 mg SL  Vitamin B12 500 mcg daily  Vitamin D 400 units daily Potassium chloride daily -Educated on Cost vs benefit of each product must be carefully weighed by individual consumer -Patient is satisfied with current therapy and denies issues -Recommended to continue current medication  Patient Goals/Self-Care Activities Patient will:  - take medications as prescribed as evidenced by patient report and record review check blood pressure weekly and if symptomatic, document, and provide at future appointments  Follow Up Plan: Telephone  follow up appointment with care management team member scheduled for:6 months       Patient verbalizes understanding of instructions and care plan provided today and agrees to view in [place]. Active MyChart status and patient understanding of how to access instructions and care plan via MyChart confirmed with patient.    Telephone follow up appointment with pharmacy team member scheduled for: 6 months  Viona Gilmore, [facility]

## 2022-04-14 NOTE — Progress Notes (Signed)
Chronic Care Management Pharmacy Note  04/14/2022 Name:  [name] MRN:  [id number] DOB:  10/09/1931  Summary: Pt is still dealing with neuropathy and peripheral edema  Recommendations/Changes made from today's visit: -Recommended repeat vitamin B12 level given worsening neuropathy -Recommended routine weights at home to assess fluid build up  Plan: BP assessment in 3 months Follow up in 6 months   Subjective: [name] is an 88 y.o. year old male who is a primary patient of Burchette, Alinda Sierras, MD.  The CCM team was consulted for assistance with disease management and care coordination needs.    Engaged with patient by telephone for follow up visit in response to provider referral for pharmacy case management and/or care coordination services.   Consent to Services:  The patient was given information about Chronic Care Management services, agreed to services, and gave verbal consent prior to initiation of services.  Please see initial visit note for detailed documentation.   Patient Care Team: [name], MD as PCP - General Sueanne Margarita, MD as PCP - Cardiology (Cardiology) Viona Gilmore, [facility] as Pharmacist (Pharmacist)  Recent office visits: 02/18/22 [name]: Patient presented for ankle pain and swelling. Continue furosemide 40 mg once daily. Increased metoprolol to 50 mg daily.  02/18/22 [name]: Patient presented for ankle swelling. Prescribed tramadol for lumbar stenosis.  11/26/2021 [name] - Patient was seen for right lumbar radiculitis. Increased Gabapentin to 300 mg 3 times daily. No follow up noted.    11/20/2021 [name] - Patient was seen for right sciatic nerve pain. Started on Prednisone $RemoveBefor'20mg'aKxRWYvszfIT$  taper and Tramadol 50 mg every 6 hours prn. No follow up noted.   Recent consult visits: 02/21/22 Fransico Him, MD (cardiology): Patient presented for Afib, HTN and HLD follow up.  Follow up in 6  months.  Hospital visits: None in previous 6 months   Objective:  Lab Results  Component Value Date   CREATININE 1.36 (H) 02/21/2022   BUN 27 02/21/2022   GFR 40.53 (L) 12/25/2021   GFRNONAA 40 (L) 11/03/2019   GFRAA 46 (L) 11/03/2019   NA 143 02/21/2022   K 4.2 02/21/2022   CALCIUM 9.3 02/21/2022   CO2 26 02/21/2022   GLUCOSE 88 02/21/2022    Lab Results  Component Value Date/Time   HGBA1C 6.1 (A) 01/02/2021 03:45 PM   HGBA1C 5.7 (H) 04/07/2019 07:17 PM   HGBA1C 6.0 10/28/2016 11:25 AM   GFR 40.53 (L) 12/25/2021 04:22 PM   GFR 54.46 (L) 05/31/2021 02:03 PM    Last diabetic Eye exam:  Lab Results  Component Value Date/Time   HMDIABEYEEXA No Retinopathy 12/15/2017 12:00 AM    Last diabetic Foot exam: No results found for: "HMDIABFOOTEX"   Lab Results  Component Value Date   CHOL 131 11/15/2020   HDL 33 (L) 11/15/2020   LDLCALC 74 11/15/2020   LDLDIRECT 91.0 09/19/2020   TRIG 138 11/15/2020   CHOLHDL 4.0 11/15/2020       Latest Ref Rng & Units 11/15/2020    9:18 AM 09/19/2020    2:18 PM 03/27/2020    4:46 PM  Hepatic Function  Total Protein 6.0 - 8.5 g/dL 6.1  6.2  6.6   Albumin 3.6 - 4.6 g/dL 4.2  4.0    AST 0 - 40 IU/L $Remov'13  13  14   'EwOxuc$ ALT 0 - 44 IU/L $Remov'7  13  7   'QILnMX$ Alk Phosphatase 44 - 121  IU/L 69  72    Total Bilirubin 0.0 - 1.2 mg/dL 0.5  0.5  0.5   Bilirubin, Direct 0.0 - 0.3 mg/dL  0.1      Lab Results  Component Value Date/Time   TSH 2.32 03/27/2020 04:46 PM   TSH 3.02 03/16/2019 09:12 AM       Latest Ref Rng & Units 02/21/2022    9:26 AM 03/27/2020    4:46 PM 11/03/2019   10:15 AM  CBC  WBC 3.4 - 10.8 x10E3/uL 7.9  7.1  8.1   Hemoglobin 13.0 - 17.7 g/dL 13.2  15.4  14.2   Hematocrit 37.5 - 51.0 % 40.1  44.6  42.3   Platelets 150 - 450 x10E3/uL 235  222  209     No results found for: "VD25OH"  Clinical ASCVD: No  The ASCVD Risk score (Arnett DK, et al., 2019) failed to calculate for the following reasons:   The 2019 ASCVD risk score is only  valid for ages 36 to 58       11/20/2021    4:39 PM 03/27/2020    4:16 PM 11/03/2017    9:43 AM  Depression screen PHQ 2/9  Decreased Interest 1 0 0  Down, Depressed, Hopeless 0 0 0  PHQ - 2 Score 1 0 0  Altered sleeping  0   Tired, decreased energy  1   Change in appetite  3   Feeling bad or failure about yourself   0   Trouble concentrating  1   Moving slowly or fidgety/restless  0   Suicidal thoughts  0   PHQ-9 Score  5   Difficult doing work/chores  Not difficult at all      CHA2DS2/VAS Stroke Risk Points  Current as of 37 minutes ago     3 >= 2 Points: High Risk  1 - 1.99 Points: Medium Risk  0 Points: Low Risk    Last Change: N/A      Details    This score determines the patient's risk of having a stroke if the  patient has atrial fibrillation.       Points Metrics  0 Has Congestive Heart Failure:  No    Current as of 37 minutes ago  0 Has Vascular Disease:  No    Current as of 37 minutes ago  1 Has Hypertension:  Yes    Current as of 37 minutes ago  2 Age:  77    Current as of 37 minutes ago  0 Has Diabetes:  No    Current as of 37 minutes ago  0 Had Stroke:  No  Had TIA:  No  Had Thromboembolism:  No    Current as of 37 minutes ago  0 Male:  No    Current as of 37 minutes ago     Social History   Tobacco Use  Smoking Status Former   Packs/day: 1.00   Years: 30.00   Total pack years: 30.00   Types: Cigarettes   Quit date: 03/13/1988   Years since quitting: 34.1  Smokeless Tobacco Current   Types: Chew   BP Readings from Last 3 Encounters:  02/21/22 130/78  02/18/22 116/66  12/25/21 134/66   Pulse Readings from Last 3 Encounters:  02/21/22 65  02/18/22 79  12/25/21 83   Wt Readings from Last 3 Encounters:  02/21/22 194 lb (88 kg)  02/18/22 193 lb 14.4 oz (88 kg)  12/25/21 190 lb 6.4 oz (86.4  kg)   BMI Readings from Last 3 Encounters:  02/21/22 24.91 kg/m  02/18/22 24.90 kg/m  12/25/21 24.45 kg/m    Assessment/Interventions:  Review of patient past medical history, allergies, medications, health status, including review of consultants reports, laboratory and other test data, was performed as part of comprehensive evaluation and provision of chronic care management services.   SDOH:  (Social Determinants of Health) assessments and interventions performed: Yes SDOH Interventions    Flowsheet Row Most Recent Value  SDOH Interventions   Financial Strain Interventions Intervention Not Indicated      SDOH Screenings   Alcohol Screen: Not on file  Depression (PHQ2-9): Low Risk  (11/20/2021)   Depression (PHQ2-9)    PHQ-2 Score: 1  Financial Resource Strain: Low Risk  (04/14/2022)   Overall Financial Resource Strain (CARDIA)    Difficulty of Paying Living Expenses: Not very hard  Food Insecurity: Not on file  Housing: Not on file  Physical Activity: Not on file  Social Connections: Not on file  Stress: Not on file  Tobacco Use: High Risk (02/21/2022)   Patient History    Smoking Tobacco Use: Former    Smokeless Tobacco Use: Current    Passive Exposure: Not on file  Transportation Needs: No Transportation Needs (04/18/2020)   PRAPARE - Hydrologist (Medical): No    Lack of Transportation (Non-Medical): No    CCM Care Plan  Allergies  Allergen Reactions   Amoxicillin-Pot Clavulanate Diarrhea and Nausea Only   Losartan Other (See Comments) and Nausea And Vomiting    Elevated creatinine levels Elevated creatinine levels    Medications Reviewed Today     Reviewed by Viona Gilmore, [facility] (Pharmacist) on 04/14/22 at 1520  Med List Status: <None>   Medication Order Taking? Sig Documenting Provider Last Dose Status Informant  acetaminophen (TYLENOL) 500 MG tablet [id number] Yes Take 500 mg by mouth every 6 (six) hours as needed for mild pain. [provider] Taking Active Multiple Informants  amLODipine (NORVASC) 2.5 MG tablet [id number] Yes TAKE 1 TABLET DAILY  Burchette, Alinda Sierras, MD Taking Active   apixaban (ELIQUIS) 5 MG TABS tablet [id number] Yes Take 1 tablet (5 mg total) by mouth 2 (two) times daily. Sueanne Margarita, MD Taking Active   budesonide-formoterol [facility]) 160-4.5 MCG/ACT inhaler [id number]  TAKE 2 PUFFS BY MOUTH TWICE A DAY Burchette, Alinda Sierras, MD  Active   finasteride (PROSCAR) 5 MG tablet [id number]  TAKE 1 TABLET (5 MG TOTAL) BY MOUTH DAILY. [name], MD  Active   furosemide (LASIX) 40 MG tablet [id number] Yes TAKE 1 TABLET EVERY DAY AS NEEDED FOR LEG SWELLING Burchette, Alinda Sierras, MD Taking Active   gabapentin (NEURONTIN) 300 MG capsule [id number] Yes Take 1 capsule (300 mg total) by mouth 3 (three) times daily.  Patient taking differently: Take 300 mg by mouth 2 (two) times daily.   [name], MD Taking Active   glucosamine-chondroitin 500-400 MG tablet [id number]  Take 2 tablets by mouth daily. [provider]  Active   lansoprazole (PREVACID) 30 MG capsule [id number]  TAKE 1 CAPSULE EVERY DAY AT 12 NOON. Please make yearly appt with Dr. Radford Pax for March 2023 for future refills. Thank you 1st attempt Sueanne Margarita, MD  Active   lovastatin (MEVACOR) 20 MG tablet [id number]  TAKE 1 TABLET AT BEDTIME Burchette, Alinda Sierras, MD  Active   metoprolol succinate (TOPROL-XL) 50 MG 24 hr tablet [id number] Yes Take 1 tablet (50  mg total) by mouth daily. Take with or immediately following a meal. Turner, Eber Hong, MD Taking Active   metroNIDAZOLE (METROCREAM) 0.75 % cream [id number]  Apply topically daily. Burchette, Alinda Sierras, MD  Active   nitroGLYCERIN (NITROSTAT) 0.4 MG SL tablet [id number]  Place 1 tablet (0.4 mg total) under the tongue every 5 (five) minutes x 3 doses as needed for chest pain. Darreld Mclean, PA-C  Active   potassium chloride (KLOR-CON) 10 MEQ tablet [id number]  TAKE 2 TABLETS BY MOUTH EVERY DAY Burchette, Alinda Sierras, MD  Active   tamsulosin [facility]) 0.4 MG CAPS capsule [id number] Yes Take 1 capsule (0.4 mg total)  by mouth daily.  Patient taking differently: Take 0.8 mg by mouth daily.   [name], MD Taking Active   traMADol Veatrice [facility]bon) 50 MG tablet [id number] Yes TAKE 1 TABLET BY MOUTH EVERY 6 HOURS AS NEEDED FOR UP TO 5 DAYS. [name], MD Taking Active   vitamin B-12 (CYANOCOBALAMIN) 500 MCG tablet [id number]  Take 500 mcg by mouth daily. [provider]  Active Multiple Informants  VITAMIN D PO [id number]  Take 400 Units by mouth daily.  [provider]  Active Multiple Informants            Patient Active Problem List   Diagnosis Date Noted   Ventricular bigeminy 04/08/2019   Unstable angina ([place]) 04/07/2019   Right sciatic nerve pain 07/18/2015   Recurrent vertigo 07/18/2015   BPH (benign prostatic hyperplasia) 06/22/2015   CKD (chronic kidney disease) stage 3, GFR 30-59 ml/min ([place]) 12/22/2014   Lumbar stenosis with neurogenic claudication 04/20/2014   Spinal stenosis of lumbar region 12/15/2013   Need for shingles vaccine 08/31/2012   Prediabetes 10/16/2011   CARCINOMA, SKIN, SQUAMOUS CELL 09/11/2009   INSOMNIA, CHRONIC 09/11/2009   PULMONARY NODULE 09/11/2009   ATRIAL FIBRILLATION 04/07/2009   BENIGN PROSTATIC HYPERTROPHY, HX OF 04/07/2009   POLYPECTOMY, HX OF 04/07/2009   Hyperlipidemia 03/13/2009   Essential hypertension 03/13/2009   GERD 03/13/2009   ROSACEA 03/13/2009   ACTINIC KERATOSIS 03/13/2009   SKIN CANCER, HX OF 03/13/2009   COLONIC POLYPS, HX OF 03/13/2009    Immunization History  Administered Date(s) Administered   Fluad Quad(high Dose 65+) 05/03/2019, 05/31/2020, 05/31/2021   Influenza Split 08/06/2011, 07/01/2012   Influenza Whole 06/06/2009, 05/07/2010   Influenza, High Dose Seasonal PF 07/06/2013, 06/22/2015, 04/25/2016, 08/05/2017, 07/20/2018   Influenza,inj,Quad PF,6+ Mos 05/24/2014   PFIZER(Purple Top)SARS-COV-2 Vaccination 08/30/2019, 09/30/2019   Pneumococcal Conjugate-13 12/22/2014   Pneumococcal Polysaccharide-23  10/16/2011   Td 08/29/2010   Zoster, Live 08/21/2014   Patient is doing better than he was. He had an injection in his leg almost 3 months ago but it has since worn off and his ankle down to foot is still a lot of pain. Next Monday he will have another injection and they are hoping this one helps more. Patient said the first injection helped with the top part of his leg and not the bottom part.  Patient is also still having swelling and is taking the furosemide every day. Patient does not weigh himself every day but will start.  Patient doesn't have symptoms of Afib when he is in it this last time. When he had it before he did have symptoms but this time he was unaware. Patient's pulse has been in the 60s mostly. Patient does feel drained often but she thinks its because of the pain all the time. Patient is still having some dizziness but  takes his time moving around.  Conditions to be addressed/monitored:  Hypertension, Hyperlipidemia, Atrial Fibrillation, GERD, Chronic Kidney Disease, and BPH  Conditions addressed this visit: Hypertension, BPH  Care Plan : CCM Pharmacy Care Plan  Updates made by Viona Gilmore, [place] since 04/14/2022 12:00 AM     Problem: Problem: Hypertension, Hyperlipidemia, Atrial Fibrillation, GERD, Chronic Kidney Disease, and BPH      Long-Range Goal: Patient-Specific Goal   Start Date: 09/11/2021  Expected End Date: 09/11/2022  Recent Progress: On track  Priority: High  Note:   Current Barriers:  Unable to independently afford treatment regimen Unable to independently monitor therapeutic efficacy  Pharmacist Clinical Goal(s):  Patient will verbalize ability to afford treatment regimen achieve adherence to monitoring guidelines and medication adherence to achieve therapeutic efficacy through collaboration with PharmD and provider.   Interventions: 1:1 collaboration with [name], MD regarding development and update of comprehensive plan of care as  evidenced by provider attestation and co-signature Inter-disciplinary care team collaboration (see longitudinal plan of care) Comprehensive medication review performed; medication list updated in electronic medical record  Hypertension (BP goal <140/90) -Controlled -Current treatment: Metoprolol succinate 50 mg 1 tablet daily in PM - Appropriate, Effective, Safe, Accessible Amlodipine 2.5 mg 1 tablet daily in AM - Appropriate, Effective, Safe, Accessible -Medications previously tried: unknown  -Current home readings: 138/63, 123/68, 135, 128, 141/73; wife checks sometimes -Current dietary habits: did not discuss -Current exercise habits: walking sometimes but limited with back pain -Reports hypotensive/hypertensive symptoms -Educated on BP goals and benefits of medications for prevention of heart attack, stroke and kidney damage; Importance of home blood pressure monitoring; Proper BP monitoring technique; Symptoms of hypotension and importance of maintaining adequate hydration; -Counseled to monitor BP at home weekly, document, and provide log at future appointments -Recommended to continue current medication  Hyperlipidemia: (LDL goal < 100) -Controlled -Current treatment: Lovastatin 20 mg 1 tablet daily at bedtime - Appropriate, Effective, Safe, Accessible -Medications previously tried: none  -Current dietary patterns: did not discuss -Current exercise habits: limited with back pain -Educated on Cholesterol goals;  -Recommended to continue current medication  Atrial Fibrillation (Goal: prevent stroke and major bleeding) -Controlled -CHADSVASC: 3 -Current treatment: Rate control: Metoprolol succinate 50 mg 1 tablet daily - Appropriate, Effective, Safe, Accessible Anticoagulation: Eliquis 5 mg 1 tablet twice daily - Appropriate, Effective, Safe, Accessible -Medications previously tried: none -Home BP and HR readings: refer to above; 58, 68, 70, 72 -Counseled on increased risk  of stroke due to Afib and benefits of anticoagulation for stroke prevention; -Recommended to continue current medication Counseled on importance of taking Eliquis doses roughly 12 hours apart.  Query COPD (Goal: control symptoms and prevent exacerbations) -Controlled -Current treatment  Symbicort 160-4.5 mcg/act  2 puffs twice daily - Query Appropriate, Effective, Safe, Accessible -Medications previously tried: none  -Gold Grade: unknown -Current COPD Classification:  A (low sx, <2 exacerbations/yr) -MMRC/CAT score: n/a -Pulmonary function testing: none -Exacerbations requiring treatment in last 6 months: none -Patient reports consistent use of maintenance inhaler -Frequency of rescue inhaler use: does not have one -Counseled on Proper inhaler technique; -Recommended spirometry testing to ensure appropriate inhaler therapy.  BPH (Goal: minimize symptoms) -Controlled -Current treatment  Tamsulosin 0.4 mg 1 capsule twice daily - Appropriate, Query effective, Query Safe, Accessible Finasteride 5 mg 1 tablet daily - Appropriate, Effective, Safe, Accessible -Medications previously tried: none  -Recommended taking tamsulosin with food to minimize dizziness.  Neuropathy (Goal: minimize pain) -Not ideally controlled -Current treatment  Gabapentin  300 mg 1 tablet twice daily - Appropriate, Query effective, Safe, Accessible Tramadol 50 mg 1 tablet as needed - Appropriate, Query effective, Safe, Accessible -Medications previously tried: none  -Recommended repeat vitamin B12 level given worsening neuropathy.   GERD (Goal: minimize symptoms) -Controlled -Current treatment  Lansoprazole 30 mg daily at noon - Appropriate, Effective, Query Safe, Accessible -Medications previously tried: none  - Reassess the need for this medication at follow up.   Health Maintenance -Vaccine gaps: tetanus, COVID booster -Current therapy:  APAP 500 mg as needed Glucosamine-Chondroitin 2 tablets twice  daily Clobetasol 0.05% ointment twice daily PRN  Metronidazole 0.75% cream three times weekly  Nitroglycerin 0.4 mg SL  Vitamin B12 500 mcg daily  Vitamin D 400 units daily Potassium chloride daily -Educated on Cost vs benefit of each product must be carefully weighed by individual consumer -Patient is satisfied with current therapy and denies issues -Recommended to continue current medication  Patient Goals/Self-Care Activities Patient will:  - take medications as prescribed as evidenced by patient report and record review check blood pressure weekly and if symptomatic, document, and provide at future appointments  Follow Up Plan: Telephone follow up appointment with care management team member scheduled for:6 months        Medication Assistance:  Symbicort obtained through [place].  Enrollment ends 08/24/22  Compliance/Adherence/Medication fill history: Care Gaps: Tetanus, COVID booster, shingrix, influenza BP: 130/78 on 02/21/2022 Last A1C - 6.1 on 01/02/2021   Star-Rating Drugs: Lovastatin 20 mg - last filled [facility]/31/2022 [facility]  Patient's preferred pharmacy is:  [id number] [address] [place] [address] Phone: [phone] Fax: [phone]  Uses pill box? Yes Pt endorses 99% compliance  We discussed: Current pharmacy is preferred with insurance plan and patient is satisfied with pharmacy services Patient decided to: Continue current medication management strategy  Care Plan and Follow Up Patient Decision:  Patient agrees to Care Plan and Follow-up.  Plan: Telephone follow up appointment with care management team member scheduled for:  6 months  Jeni Salles, PharmD, [place] [facility] [place] [phone]

## 2022-04-15 ENCOUNTER — Other Ambulatory Visit: Payer: Self-pay

## 2022-04-15 MED ORDER — LANSOPRAZOLE 30 MG PO CPDR: DELAYED_RELEASE_CAPSULE | ORAL | 3 refills | Status: DC

## 2022-04-24 DIAGNOSIS — R3914 Feeling of incomplete bladder emptying: Secondary | ICD-10-CM

## 2022-04-24 DIAGNOSIS — N401 Enlarged prostate with lower urinary tract symptoms: Secondary | ICD-10-CM

## 2022-04-24 DIAGNOSIS — I10 Essential (primary) hypertension: Secondary | ICD-10-CM | POA: Diagnosis not present

## 2022-04-24 DIAGNOSIS — I4891 Unspecified atrial fibrillation: Secondary | ICD-10-CM

## 2022-04-24 DIAGNOSIS — E785 Hyperlipidemia, unspecified: Secondary | ICD-10-CM

## 2022-05-02 ENCOUNTER — Ambulatory Visit (INDEPENDENT_AMBULATORY_CARE_PROVIDER_SITE_OTHER): Payer: Medicare (Managed Care) | Admitting: Family Medicine

## 2022-05-02 ENCOUNTER — Encounter: Payer: Self-pay | Admitting: Family Medicine

## 2022-05-02 VITALS — BP 128/70 | HR 65 | Temp 98.2°F | Ht 74.0 in | Wt 190.9 lb

## 2022-05-02 DIAGNOSIS — R197 Diarrhea, unspecified: Secondary | ICD-10-CM

## 2022-05-02 NOTE — Patient Instructions (Signed)
Recommend trial of daily fiber supplement with Metamucil, Citrucel, or Fibercon to see if stool frequency reduced.

## 2022-05-02 NOTE — Progress Notes (Unsigned)
Established Patient Office Visit  Subjective   Patient ID: [name], male    DOB: [birth year]  Age: 88 y.o. MRN: [id number]  Chief Complaint  Patient presents with   Diarrhea    Patient complains of diarrhea, x1 month    HPI  {History (Optional):23778} At least 1 month history and possibly longer of intermittent loose and occasionally watery stools.  He has occasional leakage.  His last colonoscopy was around 2012.  Family history of colon cancer in his father.  No recent bloody stools.  Appetite and weight stable.  No relation to specific foods.  No history of cholecystectomy.  Does sometimes have formed stools but sometimes more liquidy.  Also frequently has small caliber stools.  Usually has at least 1-2 stools per day and sometimes as much as 3 or 4 but never more.  No abdominal pain.  No new medications.  No recent antibiotics  Other issue is urine frequency and nocturia.  History of BPH.  Patient on tamsulosin and finasteride.  Recently getting up 3-4 times at night.  No burning with urination.  Does have slow stream.  Has seen urologist in the past  Past Medical History:  Diagnosis Date   Actinic keratosis 03/13/2009   Arthritis    BENIGN PROSTATIC HYPERTROPHY, HX OF 04/07/2009   CARCINOMA, SKIN, SQUAMOUS CELL 09/11/2009   COLONIC POLYPS, HX OF 03/13/2009   Essential hypertension    GERD 03/13/2009   Hyperlipidemia    INSOMNIA, CHRONIC 09/11/2009   Persistent atrial fibrillation ([place]) 04/07/2009   a. s/p afib ablation at [facility] x 2 in 2010.   POLYPECTOMY, HX OF 04/07/2009   Premature atrial contractions    PULMONARY NODULE 09/11/2009   PVC's (premature ventricular contractions)    Rosacea 03/13/2009   SKIN CANCER, HX OF 03/13/2009   Past Surgical History:  Procedure Laterality Date   ABLATION  2010   x2   COLONOSCOPY W/ BIOPSIES AND POLYPECTOMY     EYE SURGERY Bilateral    cataracts   LUMBAR DISC SURGERY     RUPTURE   LUMBAR LAMINECTOMY/DECOMPRESSION MICRODISCECTOMY  N/A 04/20/2014   Procedure: LUMBAR TWO-THREE, LUMBAR THREE-FOUR, LUMAR FOUR-FIVE LAMINECTOMIES;  Surgeon: Newman Pies, MD;  Location: [facility];  Service: Neurosurgery;  Laterality: N/A;   POLYPECTOMY     SKIN CANCER EXCISION      reports that he quit smoking about 34 years ago. His smoking use included cigarettes. He has a 30.00 pack-year smoking history. His smokeless tobacco use includes chew. He reports that he does not drink alcohol and does not use drugs. family history includes CAD in his brother, brother, and sister; Cancer in his mother; Cancer (age of onset: 35) in his father; Heart attack in his father; Heart disease in his brother, brother, father, and sister. Allergies  Allergen Reactions   Amoxicillin-Pot Clavulanate Diarrhea and Nausea Only   Losartan Other (See Comments) and Nausea And Vomiting    Elevated creatinine levels Elevated creatinine levels    Review of Systems  Constitutional:  Negative for chills, fever and weight loss.  Gastrointestinal:  Positive for diarrhea. Negative for abdominal pain, blood in stool, constipation, nausea and vomiting.      Objective:     BP 128/70 (BP Location: Left Arm, Patient Position: Sitting, Cuff Size: Normal)   Pulse 65   Temp 98.2 F (36.8 C) (Oral)   Ht '6\' 2"'$  (1.88 m)   Wt 190 lb 14.4 oz (86.6 kg)   SpO2 98%  BMI 24.51 kg/m  BP Readings from Last 3 Encounters:  05/02/22 128/70  02/21/22 130/78  02/18/22 116/66   Wt Readings from Last 3 Encounters:  05/02/22 190 lb 14.4 oz (86.6 kg)  02/21/22 194 lb (88 kg)  02/18/22 193 lb 14.4 oz (88 kg)      Physical Exam Vitals reviewed.  Constitutional:      Appearance: Normal appearance.  Cardiovascular:     Rate and Rhythm: Normal rate and regular rhythm.  Pulmonary:     Effort: Pulmonary effort is normal.     Breath sounds: Normal breath sounds.  Abdominal:     Palpations: Abdomen is soft.     Tenderness: There is no abdominal tenderness.  Neurological:      Mental Status: He is alert.      No results found for any visits on 05/02/22.  Last CBC Lab Results  Component Value Date   WBC 7.9 02/21/2022   HGB 13.2 02/21/2022   HCT 40.1 02/21/2022   MCV 99 (H) 02/21/2022   MCH 32.6 02/21/2022   RDW 14.2 02/21/2022   PLT 235 02/21/2022      The ASCVD Risk score (Arnett DK, et al., 2019) failed to calculate for the following reasons:   The 2019 ASCVD risk score is only valid for ages 27 to 70    Assessment & Plan:   Patient is seen with fecal soiling and loose to intermittent watery stools.  Positive family history of colon cancer in his father.  His last colonoscopy was about 11 years ago.  Denies any recent appetite change, weight loss, bloody stools, etc.  -Recommend trial of fiber supplement daily with Citrucel, Metamucil, or FiberCon -If stool frequency and consistency not improving with the above consider GI referral  No follow-ups on file.    Carolann Littler, MD

## 2022-05-08 DIAGNOSIS — C44222 Squamous cell carcinoma of skin of right ear and external auricular canal: Secondary | ICD-10-CM | POA: Diagnosis not present

## 2022-05-21 ENCOUNTER — Encounter: Payer: Self-pay | Admitting: Family Medicine

## 2022-05-21 ENCOUNTER — Ambulatory Visit (INDEPENDENT_AMBULATORY_CARE_PROVIDER_SITE_OTHER): Payer: Medicare (Managed Care) | Admitting: Family Medicine

## 2022-05-21 VITALS — BP 134/62 | HR 80 | Temp 98.1°F | Ht 74.0 in | Wt 188.9 lb

## 2022-05-21 DIAGNOSIS — R197 Diarrhea, unspecified: Secondary | ICD-10-CM | POA: Diagnosis not present

## 2022-05-21 DIAGNOSIS — Z23 Encounter for immunization: Secondary | ICD-10-CM

## 2022-05-21 NOTE — Progress Notes (Unsigned)
Established Patient Office Visit  Subjective   Patient ID: [name], male    DOB: Dec 28, 1931  Age: 88 y.o. MRN: [id number]  Chief Complaint  Patient presents with   Follow-up   Diarrhea    HPI  {History (Optional):23778} Ongoing frequent loose stools and occasionally watery stools with usually 1 to 4/day.  Refer to last visit for details.  We had suggested trial of FiberCon but this did not seem to help any.  He has occasional leakage of stools when he is straining to urinate.  He has seen urologist and was placed on Gemtesa but has not seen any improvement in urinary symptoms with that.  He already takes tamsulosin and finasteride.  Denies any recent appetite or weight changes.  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  No recent abdominal pain.  No fevers or chills.  No recent antibiotic use.  Past Medical History:  Diagnosis Date   Actinic keratosis 03/13/2009   Arthritis    BENIGN PROSTATIC HYPERTROPHY, HX OF 04/07/2009   CARCINOMA, SKIN, SQUAMOUS CELL 09/11/2009   COLONIC POLYPS, HX OF 03/13/2009   Essential hypertension    GERD 03/13/2009   Hyperlipidemia    INSOMNIA, CHRONIC 09/11/2009   Persistent atrial fibrillation ([place]) 04/07/2009   a. s/p afib ablation at [facility] x 2 in 2010.   POLYPECTOMY, HX OF 04/07/2009   Premature atrial contractions    PULMONARY NODULE 09/11/2009   PVC's (premature ventricular contractions)    Rosacea 03/13/2009   SKIN CANCER, HX OF 03/13/2009   Past Surgical History:  Procedure Laterality Date   ABLATION  2010   x2   COLONOSCOPY W/ BIOPSIES AND POLYPECTOMY     EYE SURGERY Bilateral    cataracts   LUMBAR DISC SURGERY     RUPTURE   LUMBAR LAMINECTOMY/DECOMPRESSION MICRODISCECTOMY N/A 04/20/2014   Procedure: LUMBAR TWO-THREE, LUMBAR THREE-FOUR, LUMAR FOUR-FIVE  LAMINECTOMIES;  Surgeon: Newman Pies, MD;  Location: [facility];  Service: Neurosurgery;  Laterality: N/A;   POLYPECTOMY     SKIN CANCER EXCISION      reports that he quit smoking about 34 years ago. His smoking use included cigarettes. He has a 30.00 pack-year smoking history. His smokeless tobacco use includes chew. He reports that he does not drink alcohol and does not use drugs. family history includes CAD in his brother, brother, and sister; Cancer in his mother; Cancer (age of onset: 77) in his father; Heart attack in his father; Heart disease in his brother, brother, father, and sister. Allergies  Allergen Reactions   Amoxicillin-Pot Clavulanate Diarrhea and Nausea Only   Losartan Other (See Comments) and Nausea And Vomiting    Elevated creatinine levels Elevated creatinine levels    Review of Systems  Constitutional:  Negative for chills, fever and weight loss.  Cardiovascular:  Negative for chest pain.  Gastrointestinal:  Positive for diarrhea. Negative for abdominal pain, blood in stool, melena, nausea and vomiting.  Genitourinary:  Negative for hematuria.      Objective:     BP 134/62 (BP Location: Left Arm, Patient Position: Sitting, Cuff Size: Normal)   Pulse 80   Temp 98.1 F (36.7 C) (Oral)   Ht $R'6\' 2"'bm$  (1.88 m)   Wt 188 lb 14.4 oz (85.7 kg)   SpO2 98%  BMI 24.25 kg/m  BP Readings from Last 3 Encounters:  05/21/22 134/62  05/02/22 128/70  02/21/22 130/78   Wt Readings from Last 3 Encounters:  05/21/22 188 lb 14.4 oz (85.7 kg)  05/02/22 190 lb 14.4 oz (86.6 kg)  02/21/22 194 lb (88 kg)      Physical Exam Vitals reviewed.  Constitutional:      Appearance: Normal appearance.  Cardiovascular:     Rate and Rhythm: Normal rate and regular rhythm.  Pulmonary:     Effort: Pulmonary effort is normal.     Breath sounds: Normal breath sounds.  Abdominal:     Palpations: Abdomen is soft.     Tenderness: There is no abdominal tenderness.  Neurological:      Mental Status: He is alert.      No results found for any visits on 05/21/22.  Last CBC Lab Results  Component Value Date   WBC 7.9 02/21/2022   HGB 13.2 02/21/2022   HCT 40.1 02/21/2022   MCV 99 (H) 02/21/2022   MCH 32.6 02/21/2022   RDW 14.2 02/21/2022   PLT 235 70/92/9574   Last metabolic panel Lab Results  Component Value Date   GLUCOSE 88 02/21/2022   NA 143 02/21/2022   K 4.2 02/21/2022   CL 100 02/21/2022   CO2 26 02/21/2022   BUN 27 02/21/2022   CREATININE 1.36 (H) 02/21/2022   EGFR 50 (L) 02/21/2022   CALCIUM 9.3 02/21/2022   PROT 6.1 11/15/2020   ALBUMIN 4.2 11/15/2020   LABGLOB 1.9 11/15/2020   AGRATIO 2.2 11/15/2020   BILITOT 0.5 11/15/2020   ALKPHOS 69 11/15/2020   AST 13 11/15/2020   ALT 7 11/15/2020   ANIONGAP 10 04/07/2019   Last thyroid functions Lab Results  Component Value Date   TSH 2.32 03/27/2020      The ASCVD Risk score (Arnett DK, et al., 2019) failed to calculate for the following reasons:   The 2019 ASCVD risk score is only valid for ages 52 to 44    Assessment & Plan:   Problem List Items Addressed This Visit   None Visit Diagnoses     Diarrhea, unspecified type    -  Primary   Relevant Orders   Ambulatory referral to Gastroenterology   Need for influenza vaccination       Relevant Orders   Flu Vaccine QUAD High Dose(Fluad) (Completed)     Patient relates at least 55-month history of frequent loose to sometimes watery nonbloody stools.  Did not improve any with fiber supplement.  Etiology unclear.  No recent antibiotics.  Last colonoscopy 2012  -Handout given on appropriate diet -Set up GI referral  No follow-ups on file.    Carolann Littler, MD

## 2022-05-21 NOTE — Patient Instructions (Signed)
I am setting up GI referral to further assess.

## 2022-05-27 ENCOUNTER — Ambulatory Visit: Payer: Medicare (Managed Care) | Attending: Cardiology

## 2022-05-27 DIAGNOSIS — I480 Paroxysmal atrial fibrillation: Secondary | ICD-10-CM

## 2022-05-27 DIAGNOSIS — C44622 Squamous cell carcinoma of skin of right upper limb, including shoulder: Secondary | ICD-10-CM | POA: Diagnosis not present

## 2022-05-27 DIAGNOSIS — R0789 Other chest pain: Secondary | ICD-10-CM

## 2022-05-28 LAB — BASIC METABOLIC PANEL
BUN/Creatinine Ratio: 20 (ref 10–24)
BUN: 28 mg/dL — ABNORMAL HIGH (ref 8–27)
CO2: 23 mmol/L (ref 20–29)
Calcium: 9.1 mg/dL (ref 8.6–10.2)
Chloride: 102 mmol/L (ref 96–106)
Creatinine, Ser: 1.38 mg/dL — ABNORMAL HIGH (ref 0.76–1.27)
Glucose: 204 mg/dL — ABNORMAL HIGH (ref 70–99)
Potassium: 4.5 mmol/L (ref 3.5–5.2)
Sodium: 143 mmol/L (ref 134–144)
eGFR: 49 mL/min/{1.73_m2} — ABNORMAL LOW (ref >59)

## 2022-06-11 ENCOUNTER — Other Ambulatory Visit: Payer: Self-pay | Admitting: Family Medicine

## 2022-06-11 DIAGNOSIS — R600 Localized edema: Secondary | ICD-10-CM

## 2022-06-16 ENCOUNTER — Other Ambulatory Visit: Payer: Self-pay | Admitting: Family Medicine

## 2022-07-08 ENCOUNTER — Other Ambulatory Visit: Payer: Self-pay | Admitting: Family Medicine

## 2022-07-09 ENCOUNTER — Telehealth: Payer: Self-pay | Admitting: Pharmacist

## 2022-07-09 NOTE — Chronic Care Management (AMB) (Signed)
Chronic Care Management Pharmacy Assistant   Name: [name]  MRN: [id number] DOB: 1932/06/12  Reason for Encounter: Disease State / Hypertension Assessment Call   Conditions to be addressed/monitored: HTN  Recent office visits:  05/21/2022 [name] - Patient was seen for diarrhea and and additional concern. No medication changes. No follow up noted.    05/02/2022  [name] - Patient was seen for diarrhea and and additional concern. Discontinued Gabapentin, Nitroglycerin and Tramadol. No follow up noted.   Recent consult visits:  05/05/2022 Simeon Craft PA-C (neurosurgery) - Patient was seen for Radiculopathy, lumbar region and an additional concern. No additional chart notes.   Hospital visits:  None  Medications: Outpatient Encounter Medications as of 07/09/2022  Medication Sig   acetaminophen (TYLENOL) 500 MG tablet Take 500 mg by mouth every 6 (six) hours as needed for mild pain.   amLODipine (NORVASC) 2.5 MG tablet TAKE 1 TABLET DAILY   budesonide-formoterol (SYMBICORT) 160-4.5 MCG/ACT inhaler TAKE 2 PUFFS BY MOUTH TWICE A DAY   ELIQUIS 5 MG TABS tablet TAKE 1 TABLET TWICE A DAY   finasteride (PROSCAR) 5 MG tablet TAKE 1 TABLET (5 MG TOTAL) BY MOUTH DAILY.   furosemide (LASIX) 40 MG tablet TAKE 1 TABLET EVERY DAY AS NEEDED FOR LEG SWELLING   GEMTESA 75 MG TABS Take 1 tablet by mouth daily.   glucosamine-chondroitin 500-400 MG tablet Take 2 tablets by mouth daily.   lansoprazole (PREVACID) 30 MG capsule TAKE 1 CAPSULE EVERY DAY AT 12 NOON.   lovastatin (MEVACOR) 20 MG tablet TAKE 1 TABLET AT BEDTIME   metoprolol succinate (TOPROL-XL) 50 MG 24 hr tablet Take 1 tablet (50 mg total) by mouth daily. Take with or immediately following a meal.   metroNIDAZOLE (METROCREAM) 0.75 % cream Apply topically daily.   potassium chloride (KLOR-CON) 10 MEQ tablet TAKE 2 TABLETS BY MOUTH EVERY DAY   tamsulosin (FLOMAX) 0.4 MG CAPS capsule Take 1 capsule (0.4 mg total)  by mouth daily. (Patient taking differently: Take 0.8 mg by mouth in the morning and at bedtime.)   vitamin B-12 (CYANOCOBALAMIN) 500 MCG tablet Take 500 mcg by mouth daily.   VITAMIN D PO Take 400 Units by mouth daily.    No facility-administered encounter medications on file as of 07/09/2022.  Fill History:   Dispensed Days Supply Quantity Provider Pharmacy  AMLODIPINE BESYLATE 2.5 MG TAB 06/13/2022 90 90 each      Dispensed Days Supply Quantity Provider Pharmacy  SYMBICORT 160-4.5 MCG INHALER 09/06/2021 90 30 each      Dispensed Days Supply Quantity Provider Pharmacy  FUROSEMIDE 40 MG TABLET 05/31/2022 90 90 each      Dispensed Days Supply Quantity Provider Pharmacy  LANSOPRAZOLE DR 30 MG CAPSULE 04/15/2022 90 90 each      Dispensed Days Supply Quantity Provider Pharmacy  LOVASTATIN 20 MG TABLET 04/22/2022 90 90 each      Dispensed Days Supply Quantity Provider Pharmacy  METOPROLOL SUCC ER 50 MG TAB 06/18/2022 90 90 each      Dispensed Days Supply Quantity Provider Pharmacy  METRONIDAZOLE 0.75% CREAM 12/25/2021 90 45 g      Dispensed Days Supply Quantity Provider Pharmacy  POTASSIUM CL ER 10 MEQ TABLET 07/08/2022 30 60 tablet      Dispensed Days Supply Quantity Provider Pharmacy  TAMSULOSIN HCL 0.4 MG CAPSULE 07/08/2022 30 60 capsule     Reviewed chart prior to disease state call. Spoke with patient regarding BP  Recent  Office Vitals: BP Readings from Last 3 Encounters:  05/21/22 134/62  05/02/22 128/70  02/21/22 130/78   Pulse Readings from Last 3 Encounters:  05/21/22 80  05/02/22 65  02/21/22 65    Wt Readings from Last 3 Encounters:  05/21/22 188 lb 14.4 oz (85.7 kg)  05/02/22 190 lb 14.4 oz (86.6 kg)  02/21/22 194 lb (88 kg)     Kidney Function Lab Results  Component Value Date/Time   CREATININE 1.38 (H) 05/27/2022 02:13 PM   CREATININE 1.36 (H) 02/21/2022 09:26 AM   CREATININE 1.53 (H) 03/27/2020 04:46 PM   GFR 40.53 (L) 12/25/2021 04:22 PM    GFRNONAA 40 (L) 11/03/2019 10:15 AM   GFRAA 46 (L) 11/03/2019 10:15 AM       Latest Ref Rng & Units 05/27/2022    2:13 PM 02/21/2022    9:26 AM 12/25/2021    4:22 PM  BMP  Glucose 70 - 99 mg/dL 204  88  103   BUN 8 - 27 mg/dL '28  27  28   '$ Creatinine 0.76 - 1.27 mg/dL 1.38  1.36  1.53   BUN/Creat Ratio 10 - '24 20  20    '$ Sodium 134 - 144 mmol/L 143  143  140   Potassium 3.5 - 5.2 mmol/L 4.5  4.2  4.3   Chloride 96 - 106 mmol/L 102  100  100   CO2 20 - 29 mmol/L '23  26  30   '$ Calcium 8.6 - 10.2 mg/dL 9.1  9.3  9.5     Current antihypertensive regimen:  Amlodipine 2.5 mg daily Metoprolol 50 mg daily  How often are you checking your Blood Pressure?   Current home BP readings:   What recent interventions/DTPs have been made by any provider to improve Blood Pressure control since last CPP Visit: No recent interventions  Any recent hospitalizations or ED visits since last visit with CPP? No recent hospital visits  What diet changes have been made to improve Blood Pressure Control?  Patient follows [place] exercise is being done to improve your Blood Pressure Control?    Adherence Review: Is the patient currently on ACE/ARB medication? No Does the patient have >5 day gap between last estimated fill dates? No  Unable to reach patient after several attempts  Care Gaps: AWV - 07/09/22 message to Ramond Craver, patient declined 02/21/21, do not call 04/15/21 per Ramond Craver Last BP - 134/62 on 05/21/2022 Last A1C - 6.1 on 01/02/2021 AWV - never done Shingrix - never done Covid - overdue Tdap - overdue  Star Rating Drugs: Lovastatin 20 mg - last filled 04/22/2022 90 DS at [place] Pharmacist Assistant [phone]

## 2022-07-10 NOTE — Telephone Encounter (Signed)
Spoke with patient concerning Gabepentin '300mg'$       He stated that he does currently take the medication but he requested if the prescription can be rewritten for Gapapentin '100mg'$  to take 2 tablets at bed time and 1 tablet in morning.     Please advise if okay to send as patient requested

## 2022-07-11 ENCOUNTER — Other Ambulatory Visit: Payer: Self-pay

## 2022-07-11 DIAGNOSIS — M792 Neuralgia and neuritis, unspecified: Secondary | ICD-10-CM

## 2022-07-11 MED ORDER — GABAPENTIN 100 MG PO CAPS: ORAL_CAPSULE | ORAL | 2 refills | Status: DC

## 2022-07-21 DIAGNOSIS — L718 Other rosacea: Secondary | ICD-10-CM | POA: Diagnosis not present

## 2022-07-25 ENCOUNTER — Ambulatory Visit: Payer: Medicare (Managed Care) | Attending: Cardiology | Admitting: Cardiology

## 2022-07-25 ENCOUNTER — Encounter: Payer: Self-pay | Admitting: Cardiology

## 2022-07-25 VITALS — BP 134/80 | HR 73 | Ht 74.0 in | Wt 194.0 lb

## 2022-07-25 DIAGNOSIS — E7800 Pure hypercholesterolemia, unspecified: Secondary | ICD-10-CM

## 2022-07-25 DIAGNOSIS — R0789 Other chest pain: Secondary | ICD-10-CM | POA: Diagnosis not present

## 2022-07-25 DIAGNOSIS — I480 Paroxysmal atrial fibrillation: Secondary | ICD-10-CM | POA: Diagnosis not present

## 2022-07-25 DIAGNOSIS — I10 Essential (primary) hypertension: Secondary | ICD-10-CM | POA: Diagnosis not present

## 2022-07-25 DIAGNOSIS — R600 Localized edema: Secondary | ICD-10-CM

## 2022-07-25 NOTE — Patient Instructions (Signed)
Medication Instructions:  Your physician recommends that you continue on your current medications as directed. Please refer to the Current Medication list given to you today.  *If you need a refill on your cardiac medications before your next appointment, please call your pharmacy*   Lab Work: None If you have labs (blood work) drawn today and your tests are completely normal, you will receive your results only by: [place] (if you have MyChart) OR A paper copy in the mail If you have any lab test that is abnormal or we need to change your treatment, we will call you to review the results.   Testing/Procedures: None   Follow-Up: At [facility], you and your health needs are our priority.  As part of our continuing mission to provide you with exceptional heart care, we have created designated Provider Care Teams.  These Care Teams include your primary Cardiologist (physician) and Advanced Practice Providers (APPs -  Physician Assistants and Nurse Practitioners) who all work together to provide you with the care you need, when you need it.  We recommend signing up for the patient portal called "MyChart".  Sign up information is provided on this After Visit Summary.  MyChart is used to connect with patients for Virtual Visits (Telemedicine).  Patients are able to view lab/test results, encounter notes, upcoming appointments, etc.  Non-urgent messages can be sent to your provider as well.   To learn more about what you can do with MyChart, go to [email or web address].    Your next appointment:   12 month(s)  The format for your next appointment:   In Person  Provider:   Fransico Him, MD    Other Instructions Wear compression stockings to help with swelling  2 gram sodium diet  Important Information About Sugar

## 2022-07-25 NOTE — Progress Notes (Signed)
Cardiology Office Note:    Date:  07/25/2022   ID:  SALIOU BARNIER, DOB [birth year], MRN [id number]  PCP:  [name], MD  Cardiologist:  Fransico Him, MD    Referring MD: [name], MD   Chief Complaint  Patient presents with   Atrial Fibrillation   Hypertension   Hyperlipidemia    History of Present Illness:    [name] is a 88 y.o. male with a hx of persistent atrial fibrillation s/p afib ablation at [facility] x 2 in 2010, HTN, hyperlipidemia, PACs/PVCs (by monitor 2017), CKD stage III.   He has a history of prior atypical chest pain and negative stress test. Patient was having significant chest pain that had been intermittent for the last year, but had recently become more severe. Due to concern for unstable angina he was admitted to the hospital. Troponins were negative. 2D echo 04/08/19 showed EF 55-60%, no significant abnormalities. Outpatient nuc was arranged 04/15/19 which was normal, EF 55%.   He is here today for followup and is doing well.  He denies any chest pain or pressure, SOB, DOE, PND, orthopnea, palpitations or syncope. He has chronic LE edema. Occasionally he has some dizziness related to meds. He is compliant with his meds and is tolerating meds with no SE.    Past Medical History:  Diagnosis Date   Actinic keratosis 03/13/2009   Arthritis    BENIGN PROSTATIC HYPERTROPHY, HX OF 04/07/2009   CARCINOMA, SKIN, SQUAMOUS CELL 09/11/2009   COLONIC POLYPS, HX OF 03/13/2009   Essential hypertension    GERD 03/13/2009   Hyperlipidemia    INSOMNIA, CHRONIC 09/11/2009   Persistent atrial fibrillation ([place]) 04/07/2009   a. s/p afib ablation at [facility] x 2 in 2010.   POLYPECTOMY, HX OF 04/07/2009   Premature atrial contractions    PULMONARY NODULE 09/11/2009   PVC's (premature ventricular contractions)    Rosacea 03/13/2009   SKIN CANCER, HX OF 03/13/2009    Past Surgical History:  Procedure Laterality Date   ABLATION  2010   x2   COLONOSCOPY W/ BIOPSIES  AND POLYPECTOMY     EYE SURGERY Bilateral    cataracts   LUMBAR DISC SURGERY     RUPTURE   LUMBAR LAMINECTOMY/DECOMPRESSION MICRODISCECTOMY N/A 04/20/2014   Procedure: LUMBAR TWO-THREE, LUMBAR THREE-FOUR, LUMAR FOUR-FIVE LAMINECTOMIES;  Surgeon: Newman Pies, MD;  Location: [facility];  Service: Neurosurgery;  Laterality: N/A;   POLYPECTOMY     SKIN CANCER EXCISION      Current Medications: Current Meds  Medication Sig   acetaminophen (TYLENOL) 500 MG tablet Take 500 mg by mouth every 6 (six) hours as needed for mild pain.   amLODipine (NORVASC) 2.5 MG tablet TAKE 1 TABLET DAILY   budesonide-formoterol (SYMBICORT) 160-4.5 MCG/ACT inhaler TAKE 2 PUFFS BY MOUTH TWICE A DAY   ELIQUIS 5 MG TABS tablet TAKE 1 TABLET TWICE A DAY   finasteride (PROSCAR) 5 MG tablet TAKE 1 TABLET (5 MG TOTAL) BY MOUTH DAILY.   furosemide (LASIX) 40 MG tablet TAKE 1 TABLET EVERY DAY AS NEEDED FOR LEG SWELLING   gabapentin (NEURONTIN) 100 MG capsule Take 1 capsule by mouth in morning then take 2 capsules by month at night   GEMTESA 75 MG TABS Take 1 tablet by mouth daily.   glucosamine-chondroitin 500-400 MG tablet Take 2 tablets by mouth daily.   lansoprazole (PREVACID) 30 MG capsule TAKE 1 CAPSULE EVERY DAY AT 12 NOON.   lovastatin (MEVACOR) 20 MG tablet  TAKE 1 TABLET AT BEDTIME   metoprolol succinate (TOPROL-XL) 50 MG 24 hr tablet Take 1 tablet (50 mg total) by mouth daily. Take with or immediately following a meal.   metroNIDAZOLE (METROCREAM) 0.75 % cream Apply topically daily.   potassium chloride (KLOR-CON) 10 MEQ tablet TAKE 2 TABLETS BY MOUTH EVERY DAY   tamsulosin (FLOMAX) 0.4 MG CAPS capsule Take 1 capsule (0.4 mg total) by mouth daily.   vitamin B-12 (CYANOCOBALAMIN) 500 MCG tablet Take 500 mcg by mouth daily.   VITAMIN D PO Take 400 Units by mouth daily.      Allergies:   Amoxicillin-pot clavulanate and Losartan   Social History   Socioeconomic History   Marital status: Married    Spouse  name: Not on file   Number of children: Not on file   Years of education: Not on file   Highest education level: Bachelor's degree (e.g., BA, AB, BS)  Occupational History   Not on file  Tobacco Use   Smoking status: Former    Packs/day: 1.00    Years: 30.00    Total pack years: 30.00    Types: Cigarettes    Quit date: 03/13/1988    Years since quitting: 34.3   Smokeless tobacco: Current    Types: Chew  Substance and Sexual Activity   Alcohol use: No   Drug use: No   Sexual activity: Not on file  Other Topics Concern   Not on file  Social History Narrative   Not on file   Social Determinants of Health   Financial Resource Strain: Low Risk  (05/01/2022)   Overall Financial Resource Strain (CARDIA)    Difficulty of Paying Living Expenses: Not hard at all  Food Insecurity: No Food Insecurity (05/01/2022)   Hunger Vital Sign    Worried About Running Out of Food in the Last Year: Never true    Ran Out of Food in the Last Year: Never true  Transportation Needs: No Transportation Needs (05/01/2022)   PRAPARE - Hydrologist (Medical): No    Lack of Transportation (Non-Medical): No  Physical Activity: Unknown (05/01/2022)   Exercise Vital Sign    Days of Exercise per Week: 0 days    Minutes of Exercise per Session: Not on file  Stress: No Stress Concern Present (05/01/2022)   [place]    Feeling of Stress : Not at all  Social Connections: [place] (05/01/2022)   Social Connection and Isolation Panel [NHANES]    Frequency of Communication with Friends and Family: More than three times a week    Frequency of Social Gatherings with Friends and Family: Not on file    Attends Religious Services: More than 4 times per year    Active Member of [employer] or Organizations: Yes    Attends Music therapist: More than 4 times per year    Marital Status: Married     Family  History: The patient's family history includes CAD in his brother, brother, and sister; Cancer in his mother; Cancer (age of onset: 44) in his father; Heart attack in his father; Heart disease in his brother, brother, father, and sister.  ROS:   Please see the history of present illness.    ROS  All other systems reviewed and negative.   EKGs/Labs/Other Studies Reviewed:    The following studies were reviewed today: Prior OV notes  EKG:  EKG is not done today in  the office  Recent Labs: 02/21/2022: Hemoglobin 13.2; Platelets 235 05/27/2022: BUN 28; Creatinine, Ser 1.38; Potassium 4.5; Sodium 143   Recent Lipid Panel    Component Value Date/Time   CHOL 131 11/15/2020 0918   TRIG 138 11/15/2020 0918   HDL 33 (L) 11/15/2020 0918   CHOLHDL 4.0 11/15/2020 0918   CHOLHDL 5 09/19/2020 1418   VLDL 57.6 (H) 09/19/2020 1418   LDLCALC 74 11/15/2020 0918   LDLDIRECT 91.0 09/19/2020 1418    Physical Exam:    VS:  BP 134/80   Pulse 73   Ht '6\' 2"'$  (1.88 m)   Wt 194 lb (88 kg)   SpO2 99%   BMI 24.91 kg/m     Wt Readings from Last 3 Encounters:  07/25/22 194 lb (88 kg)  05/21/22 188 lb 14.4 oz (85.7 kg)  05/02/22 190 lb 14.4 oz (86.6 kg)    GEN: Well nourished, well developed in no acute distress HEENT: Normal NECK: No JVD; No carotid bruits LYMPHATICS: No lymphadenopathy CARDIAC:RRR, no murmurs, rubs, gallops RESPIRATORY:  Clear to auscultation without rales, wheezing or rhonchi  ABDOMEN: Soft, non-tender, non-distended MUSCULOSKELETAL:  No edema; No deformity  SKIN: Warm and dry NEUROLOGIC:  Alert and oriented x 3 PSYCHIATRIC:  Normal affect  ASSESSMENT:    1. Paroxysmal atrial fibrillation (HCC)   2. Atypical chest pain   3. Essential hypertension   4. Pure hypercholesterolemia   5. Bilateral leg edema    PLAN:    In order of problems listed above:  1.  PAF -He is NSR today on exam and denies any palpitations -continue prescription drug management with Toprol XL  '50mg'$  daily and Eliquis '5mg'$  BID with PRN refills -I have personally reviewed and interpreted outside labs performed by patient's PCP which showed SCr 1.38 and K+ 4.5 on 05/2022 and Hbg 13.2 on 01/2022  2.  Atypical chest pain -felt to be GI related -he has not had any further episodes of CP -he has chronic DOE when walking to the mailbox related to problems with severe back pain that is chronic and stable -nuclear stress test showed no ischemia 8/'2020  3.  HTN -BP controlled on exam today -continue prescription drug management with Amlodipine 2.'5mg'$  daily and Toprol XL '50mg'$  daily with PRN refills  4.  HLD -LDL goal < 100 -Continue prescription drug management with Atorvastatin '20mg'$  daily with PRN refills -followed by PCP  5.  Chronic LE edema -he has significant 2-3+ ankle edema that I think is related to chronic venous insuff -I have recommended that he use TED hose and encouraged him to prop his legs up when sitting   Medication Adjustments/Labs and Tests Ordered: Current medicines are reviewed at length with the patient today.  Concerns regarding medicines are outlined above.  No orders of the defined types were placed in this encounter.  No orders of the defined types were placed in this encounter.   Signed, Fransico Him, MD  07/25/2022 1:25 PM    [facility]

## 2022-07-28 ENCOUNTER — Ambulatory Visit: Payer: Medicare (Managed Care) | Admitting: Family Medicine

## 2022-08-20 DIAGNOSIS — D1801 Hemangioma of skin and subcutaneous tissue: Secondary | ICD-10-CM | POA: Diagnosis not present

## 2022-08-20 DIAGNOSIS — C44622 Squamous cell carcinoma of skin of right upper limb, including shoulder: Secondary | ICD-10-CM | POA: Diagnosis not present

## 2022-08-20 DIAGNOSIS — L814 Other melanin hyperpigmentation: Secondary | ICD-10-CM | POA: Diagnosis not present

## 2022-08-20 DIAGNOSIS — L821 Other seborrheic keratosis: Secondary | ICD-10-CM | POA: Diagnosis not present

## 2022-08-20 DIAGNOSIS — C44329 Squamous cell carcinoma of skin of other parts of face: Secondary | ICD-10-CM | POA: Diagnosis not present

## 2022-08-20 DIAGNOSIS — L718 Other rosacea: Secondary | ICD-10-CM | POA: Diagnosis not present

## 2022-08-20 DIAGNOSIS — Z08 Encounter for follow-up examination after completed treatment for malignant neoplasm: Secondary | ICD-10-CM | POA: Diagnosis not present

## 2022-08-20 DIAGNOSIS — Z85828 Personal history of other malignant neoplasm of skin: Secondary | ICD-10-CM | POA: Diagnosis not present

## 2022-08-20 DIAGNOSIS — D485 Neoplasm of uncertain behavior of skin: Secondary | ICD-10-CM | POA: Diagnosis not present

## 2022-09-23 ENCOUNTER — Other Ambulatory Visit: Payer: Self-pay | Admitting: Family Medicine

## 2022-09-24 DIAGNOSIS — D0461 Carcinoma in situ of skin of right upper limb, including shoulder: Secondary | ICD-10-CM | POA: Diagnosis not present

## 2022-09-24 DIAGNOSIS — D485 Neoplasm of uncertain behavior of skin: Secondary | ICD-10-CM | POA: Diagnosis not present

## 2022-09-24 DIAGNOSIS — C44622 Squamous cell carcinoma of skin of right upper limb, including shoulder: Secondary | ICD-10-CM | POA: Diagnosis not present

## 2022-09-24 DIAGNOSIS — B078 Other viral warts: Secondary | ICD-10-CM | POA: Diagnosis not present

## 2022-09-24 DIAGNOSIS — L821 Other seborrheic keratosis: Secondary | ICD-10-CM | POA: Diagnosis not present

## 2022-10-06 ENCOUNTER — Other Ambulatory Visit: Payer: Self-pay | Admitting: Family Medicine

## 2022-10-07 DIAGNOSIS — C4442 Squamous cell carcinoma of skin of scalp and neck: Secondary | ICD-10-CM | POA: Diagnosis not present

## 2022-10-07 DIAGNOSIS — D485 Neoplasm of uncertain behavior of skin: Secondary | ICD-10-CM | POA: Diagnosis not present

## 2022-10-07 DIAGNOSIS — D0439 Carcinoma in situ of skin of other parts of face: Secondary | ICD-10-CM | POA: Diagnosis not present

## 2022-10-07 NOTE — Progress Notes (Unsigned)
Care Management & Coordination Services Pharmacy Note  10/07/2022 Name:  [name] MRN:  [id number] DOB:  [birth year]  Summary: ***  Recommendations/Changes made from today's visit: ***  Follow up plan: ***   Subjective: [name] is an 89 y.o. year old male who is a primary patient of Burchette, Alinda Sierras, MD.  The care coordination team was consulted for assistance with disease management and care coordination needs.    {CCMTELEPHONEFACETOFACE:21091510} for follow up visit.  Recent office visits: 05/21/2022 [name] - Patient was seen for diarrhea and and additional concern. No medication changes. No follow up noted.   Recent consult visits: 07/25/22 Sueanne Margarita, MD (Cardio) For a-fib. No med changes  05/05/2022 Simeon Craft PA-C (neurosurgery) - Patient was seen for Radiculopathy, lumbar region and an additional concern. No additional chart notes.    Hospital visits: None   Objective:  Lab Results  Component Value Date   CREATININE 1.38 (H) 05/27/2022   BUN 28 (H) 05/27/2022   GFR 40.53 (L) 12/25/2021   EGFR 49 (L) 05/27/2022   GFRNONAA 40 (L) 11/03/2019   GFRAA 46 (L) 11/03/2019   NA 143 05/27/2022   K 4.5 05/27/2022   CALCIUM 9.1 05/27/2022   CO2 23 05/27/2022   GLUCOSE 204 (H) 05/27/2022    Lab Results  Component Value Date/Time   HGBA1C 6.1 (A) 01/02/2021 03:45 PM   HGBA1C 5.7 (H) 04/07/2019 07:17 PM   HGBA1C 6.0 10/28/2016 11:25 AM   GFR 40.53 (L) 12/25/2021 04:22 PM   GFR 54.46 (L) 05/31/2021 02:03 PM    Last diabetic Eye exam:  Lab Results  Component Value Date/Time   HMDIABEYEEXA No Retinopathy 12/15/2017 12:00 AM    Last diabetic Foot exam: No results found for: "HMDIABFOOTEX"   Lab Results  Component Value Date   CHOL 131 11/15/2020   HDL 33 (L) 11/15/2020   LDLCALC 74 11/15/2020   LDLDIRECT 91.0 09/19/2020   TRIG 138 11/15/2020   CHOLHDL 4.0 11/15/2020       Latest Ref Rng & Units 11/15/2020    9:18 AM  09/19/2020    2:18 PM 03/27/2020    4:46 PM  Hepatic Function  Total Protein 6.0 - 8.5 g/dL 6.1  6.2  6.6   Albumin 3.6 - 4.6 g/dL 4.2  4.0    AST 0 - 40 IU/L 13  13  14   $ ALT 0 - 44 IU/L 7  13  7   $ Alk Phosphatase 44 - 121 IU/L 69  72    Total Bilirubin 0.0 - 1.2 mg/dL 0.5  0.5  0.5   Bilirubin, Direct 0.0 - 0.3 mg/dL  0.1      Lab Results  Component Value Date/Time   TSH 2.32 03/27/2020 04:46 PM   TSH 3.02 03/16/2019 09:12 AM       Latest Ref Rng & Units 02/21/2022    9:26 AM 03/27/2020    4:46 PM 11/03/2019   10:15 AM  CBC  WBC 3.4 - 10.8 x10E3/uL 7.9  7.1  8.1   Hemoglobin 13.0 - 17.7 g/dL 13.2  15.4  14.2   Hematocrit 37.5 - 51.0 % 40.1  44.6  42.3   Platelets 150 - 450 x10E3/uL 235  222  209     Lab Results  Component Value Date/Time   VITAMINB12 548 07/20/2018 12:14 PM   VITAMINB12 238 09/20/2012 03:03 PM    Clinical ASCVD: No  The ASCVD Risk score (Arnett DK, et al.,  2019) failed to calculate for the following reasons:   The 2019 ASCVD risk score is only valid for ages 61 to 45        11/20/2021    4:39 PM 03/27/2020    4:16 PM 11/03/2017    9:43 AM  Depression screen PHQ 2/9  Decreased Interest 1 0 0  Down, Depressed, Hopeless 0 0 0  PHQ - 2 Score 1 0 0  Altered sleeping  0   Tired, decreased energy  1   Change in appetite  3   Feeling bad or failure about yourself   0   Trouble concentrating  1   Moving slowly or fidgety/restless  0   Suicidal thoughts  0   PHQ-9 Score  5   Difficult doing work/chores  Not difficult at all      Social History   Tobacco Use  Smoking Status Former   Packs/day: 1.00   Years: 30.00   Total pack years: 30.00   Types: Cigarettes   Quit date: 03/13/1988   Years since quitting: 34.5  Smokeless Tobacco Current   Types: Chew   BP Readings from Last 3 Encounters:  07/25/22 134/80  05/21/22 134/62  05/02/22 128/70   Pulse Readings from Last 3 Encounters:  07/25/22 73  05/21/22 80  05/02/22 65   Wt Readings from  Last 3 Encounters:  07/25/22 194 lb (88 kg)  05/21/22 188 lb 14.4 oz (85.7 kg)  05/02/22 190 lb 14.4 oz (86.6 kg)   BMI Readings from Last 3 Encounters:  07/25/22 24.91 kg/m  05/21/22 24.25 kg/m  05/02/22 24.51 kg/m    Allergies  Allergen Reactions   Amoxicillin-Pot Clavulanate Diarrhea and Nausea Only   Losartan Other (See Comments) and Nausea And Vomiting    Elevated creatinine levels Elevated creatinine levels    Medications Reviewed Today     Reviewed by Juventino Slovak, CMA (Certified Medical Assistant) on 07/25/22 at 1300  Med List Status: <None>   Medication Order Taking? Sig Documenting Provider Last Dose Status Informant  acetaminophen (TYLENOL) 500 MG tablet [id number] Yes Take 500 mg by mouth every 6 (six) hours as needed for mild pain. [provider] Taking Active Multiple Informants  amLODipine (NORVASC) 2.5 MG tablet [id number] Yes TAKE 1 TABLET DAILY Burchette, Alinda Sierras, MD Taking Active   budesonide-formoterol [facility]) 160-4.5 MCG/ACT inhaler [id number] Yes TAKE 2 PUFFS BY MOUTH TWICE A DAY Burchette, Alinda Sierras, MD Taking Active   ELIQUIS 5 MG TABS tablet [id number] Yes TAKE 1 TABLET TWICE A DAY Turner, Eber Hong, MD Taking Active   finasteride (PROSCAR) 5 MG tablet [id number] Yes TAKE 1 TABLET (5 MG TOTAL) BY MOUTH DAILY. [name], MD Taking Active   furosemide (LASIX) 40 MG tablet [id number] Yes TAKE 1 TABLET EVERY DAY AS NEEDED FOR LEG SWELLING Burchette, Alinda Sierras, MD Taking Active   gabapentin (NEURONTIN) 100 MG capsule [id number] Yes Take 1 capsule by mouth in morning then take 2 capsules by month at night [name], MD Taking Active   GEMTESA 75 MG TABS [id number] Yes Take 1 tablet by mouth daily. [provider] Taking Active   glucosamine-chondroitin 500-400 MG tablet [id number] Yes Take 2 tablets by mouth daily. [provider] Taking Active   lansoprazole (PREVACID) 30 MG capsule [id number] Yes TAKE 1 CAPSULE EVERY DAY AT  12 NOON. Sueanne Margarita, MD Taking Active   lovastatin (MEVACOR) 20 MG tablet [id number] Yes TAKE 1 TABLET AT BEDTIME Burchette, Alinda Sierras,  MD Taking Active   metoprolol succinate (TOPROL-XL) 50 MG 24 hr tablet [id number] Yes Take 1 tablet (50 mg total) by mouth daily. Take with or immediately following a meal. Turner, Eber Hong, MD Taking Active   metroNIDAZOLE (METROCREAM) 0.75 % cream [id number] Yes Apply topically daily. [name], MD Taking Active   potassium chloride (KLOR-CON) 10 MEQ tablet [id number] Yes TAKE 2 TABLETS BY MOUTH EVERY DAY Burchette, Alinda Sierras, MD Taking Active   tamsulosin (FLOMAX) 0.4 MG CAPS capsule [id number] Yes Take 1 capsule (0.4 mg total) by mouth daily. [name], MD Taking Active   vitamin B-12 (CYANOCOBALAMIN) 500 MCG tablet [id number] Yes Take 500 mcg by mouth daily. [provider] Taking Active Multiple Informants  VITAMIN D PO [id number] Yes Take 400 Units by mouth daily.  [provider] Taking Active Multiple Informants            SDOH:  (Social Determinants of Health) assessments and interventions performed: Yes SDOH Interventions    Flowsheet Row Chronic Care Management from 04/14/2022 in [place] at [place] Management from 04/18/2020 in [place] at [place] Interventions    Transportation Interventions -- Intervention Not Indicated  Financial Strain Interventions Intervention Not Indicated Intervention Not Indicated       Medication Assistance: {MEDASSISTANCEINFO:25044}  Medication Access: Within the past 30 days, how often has patient missed a dose of medication? *** Is a pillbox or other method used to improve adherence? {YES/NO:21197} Factors that may affect medication adherence? {CHL DESC; BARRIERS:21522} Are meds synced by current pharmacy? {YES/NO:21197} Are meds delivered by current pharmacy? {YES/NO:21197} Does patient experience delays in  picking up medications due to transportation concerns? {YES/NO:21197}  [facility] Reviewed: Is patient disadvantaged to use [facility]?: Yes  Current Rx insurance plan: [place] Name and location of Current pharmacy[facility]  [id number]: [phone]: [phone] [facility] services reviewed with patient today?: No  Patient requests to transfer care to [facility]?: No  Reason patient declined to change pharmacies: Disadvantaged due to insurance/mail order  Compliance/Adherence/Medication fill history: Care Gaps: AWV Vaccines: Shingles, COVID, Tdap  Star-Rating Drugs: Lovastatin 264mPDC 100%   Assessment/Plan   Hyperlipidemia: (LDL goal < 100) -Not ideally controlled -Current treatment: Lovastatin 2072md -Medications previously tried: Pravastatin  -Current dietary patterns: *** -Current exercise habits: *** -Educated on {CCM HLD Counseling:25126} -{CCMPHARMDINTERVENTION:25122}  Atrial Fibrillation (Goal: prevent stroke and major bleeding) -Controlled -CHA2DSVASC: 4 -Current treatment: Rate control: Metoprolol XL 15m37m Anticoagulation: Eliquis 5mg 47m -Medications previously tried: Nadolol -Home BP and HR readings: ***  -Counseled on {CCMAFIBCOUNSELING:25120} -{CCMPHARMDINTERVENTION:25122}  CKD (Goal: Prevent progression of kidney disease) -Controlled -Current treatment  None -Medications previously tried: ***  -{CCMPHARMDINTERVENTION:25122}   AngelMaren [facility] Pharmacist [phone]

## 2022-10-13 ENCOUNTER — Other Ambulatory Visit: Payer: Self-pay | Admitting: Family Medicine

## 2022-10-21 DIAGNOSIS — C44622 Squamous cell carcinoma of skin of right upper limb, including shoulder: Secondary | ICD-10-CM | POA: Diagnosis not present

## 2022-10-23 ENCOUNTER — Other Ambulatory Visit: Payer: Self-pay | Admitting: Family Medicine

## 2022-10-23 ENCOUNTER — Other Ambulatory Visit: Payer: Self-pay | Admitting: Cardiology

## 2022-10-23 DIAGNOSIS — I4891 Unspecified atrial fibrillation: Secondary | ICD-10-CM

## 2022-10-23 DIAGNOSIS — R600 Localized edema: Secondary | ICD-10-CM

## 2022-10-23 NOTE — Telephone Encounter (Signed)
Prescription refill request for Eliquis received. Indication: Afib  Last office visit: 07/25/22 Radford Pax)  Scr: 1.38 (05/27/22)  Age: 89 Weight: 88kg  Appropriate dose. Refill sent.

## 2022-10-25 ENCOUNTER — Other Ambulatory Visit: Payer: Self-pay | Admitting: Family Medicine

## 2022-10-25 DIAGNOSIS — M792 Neuralgia and neuritis, unspecified: Secondary | ICD-10-CM

## 2022-11-07 DIAGNOSIS — D485 Neoplasm of uncertain behavior of skin: Secondary | ICD-10-CM | POA: Diagnosis not present

## 2022-11-07 DIAGNOSIS — C4442 Squamous cell carcinoma of skin of scalp and neck: Secondary | ICD-10-CM | POA: Diagnosis not present

## 2022-11-11 ENCOUNTER — Telehealth: Payer: Self-pay | Admitting: Family Medicine

## 2022-11-11 NOTE — Telephone Encounter (Signed)
Contacted Pecola Leisure to schedule their annual wellness visit. Appointment made for 11/13/22.  Barkley Boards AWV direct phone # [phone]*

## 2022-11-12 ENCOUNTER — Telehealth: Payer: Self-pay | Admitting: Family Medicine

## 2022-11-12 NOTE — Telephone Encounter (Signed)
Contacted [name] to schedule their annual wellness visit. Appointment made for 11/18/22.  [name] AWV direct phone # [phone]   Spouse called to r/s 3/21 appt  r/s to 3/26

## 2022-11-13 ENCOUNTER — Telehealth: Payer: Medicare (Managed Care) | Admitting: Family Medicine

## 2022-11-18 ENCOUNTER — Encounter: Payer: Self-pay | Admitting: Family Medicine

## 2022-11-18 ENCOUNTER — Telehealth (INDEPENDENT_AMBULATORY_CARE_PROVIDER_SITE_OTHER): Payer: Medicare (Managed Care) | Admitting: Family Medicine

## 2022-11-18 DIAGNOSIS — Z0000 Encounter for general adult medical examination without abnormal findings: Secondary | ICD-10-CM

## 2022-11-18 NOTE — Progress Notes (Signed)
PATIENT CHECK-IN and HEALTH RISK ASSESSMENT QUESTIONNAIRE:  -completed by phone/video for upcoming Medicare Preventive Visit  Pre-Visit Check-in: 1)Vitals (height, wt, BP, etc) - record in vitals section for visit on day of visit 2)Review and Update Medications, Allergies PMH, Surgeries, Social history in Epic 3)Hospitalizations in the last year with date/reason? no  4)Review and Update Care Team (patient's specialists) in Epic 5) Complete PHQ9 in Epic  6) Complete Fall Screening in Epic 7)Review all Health Maintenance Due and order under PCP if not done.  Medicare Wellness Patient Questionnaire:  Answer theses question about your habits: Do you drink alcohol? no  Have you ever smoked? yes Quit date if applicable? Quit 30 years ago  How many packs a day do/did you smoke? Less than 1 Do you use smokeless tobacco?no Do you use an illicit drugs?no Do you exercises? No IF so, what type and how many days/minutes per week? Are you sexually active? No Number of partners? Typical breakfast-Cereal and fruit Typical lunch-sandwich, cheese and crackers Typical dinner-meat with veggies, loves hot dogs Typical snacks: oatmeal cookies   Beverages: sugar free-decaf Pepsi  Answer theses question about you: Can you perform most household chores?no Do you find it hard to follow a conversation in a noisy room?yes-wearing hearing aides, just had check up recently Do you often ask people to speak up or repeat themselves?yes Do you feel that you have a problem with memory?yes - doing ok though with this, feels is mild, wife helps Do you balance your checkbook and or bank acounts?yes Do you feel safe at home?yes Last dentist visit? 1 year ago Do you need assistance with any of the following: Please note if so:  Driving?yes  Feeding yourself?no  Getting from bed to chair?yes  Getting to the toilet?yes  Bathing or showering?no  Dressing yourself?no  Managing money?no  Climbing a flight of  stairs-yes  Preparing meals? no    Do you have Advanced Directives in place (Living Will, Healthcare Power or Attorney)? yes   Last eye Exam and location? October 2023-normal per patient's wife   Do you currently use prescribed or non-prescribed narcotic or opioid pain medications?no  Do you have a history or close family history of breast, ovarian, tubal or peritoneal cancer or a family member with BRCA (breast cancer susceptibility 1 and 2) gene mutations? Father-deceased had colon cancer  Nurse/Assistant Credentials/time stamp: Luellen Pucker   ----------------------------------------------------------------------------------------------------------------------------------------------------------------------------------------------------------------------    MEDICARE ANNUAL PREVENTIVE CARE VISIT WITH PROVIDER (Welcome to [facility], initial annual wellness or annual wellness exam)  Virtual Visit via Video Note  I connected with [name] on 11/18/22  by a video enabled telemedicine application and verified that I am speaking with the correct person using two identifiers.  Location patient: home Location provider:work or home office Persons participating in the virtual visit: patient, provider, patient's wife  Concerns and/or follow up today: reports all is stable. Reports his bowels are improving.    See HM section in Epic for other details of completed HM.    ROS: negative for report of fevers, unintentional weight loss, vision changes, vision loss, hearing loss or change, chest pain, sob, hemoptysis, melena, hematochezia, hematuria, falls, bleeding or bruising, loc, thoughts of suicide or self harm, memory loss  Patient-completed extensive health risk assessment - reviewed and discussed with the patient: See Health Risk Assessment completed with patient prior to the visit either above or in recent phone note. This was reviewed in detailed with the patient today  and appropriate recommendations, orders  and referrals were placed as needed per Summary below and patient instructions.   Review of Medical History: -PMH, PSH, Family History and current specialty and care providers reviewed and updated and listed below   Patient Care Team: [name], MD as PCP - General Sueanne Margarita, MD as PCP - Cardiology (Cardiology) Viona Gilmore, [facility] (Inactive) as Pharmacist (Pharmacist)   Past Medical History:  Diagnosis Date   Actinic keratosis 03/13/2009   Arthritis    BENIGN PROSTATIC HYPERTROPHY, HX OF 04/07/2009   CARCINOMA, SKIN, SQUAMOUS CELL 09/11/2009   COLONIC POLYPS, HX OF 03/13/2009   Essential hypertension    GERD 03/13/2009   Hyperlipidemia    INSOMNIA, CHRONIC 09/11/2009   Persistent atrial fibrillation ([place]) 04/07/2009   a. s/p afib ablation at [facility] x 2 in 2010.   POLYPECTOMY, HX OF 04/07/2009   Premature atrial contractions    PULMONARY NODULE 09/11/2009   PVC's (premature ventricular contractions)    Rosacea 03/13/2009   SKIN CANCER, HX OF 03/13/2009    Past Surgical History:  Procedure Laterality Date   ABLATION  2010   x2   COLONOSCOPY W/ BIOPSIES AND POLYPECTOMY     EYE SURGERY Bilateral    cataracts   LUMBAR DISC SURGERY     RUPTURE   LUMBAR LAMINECTOMY/DECOMPRESSION MICRODISCECTOMY N/A 04/20/2014   Procedure: LUMBAR TWO-THREE, LUMBAR THREE-FOUR, LUMAR FOUR-FIVE LAMINECTOMIES;  Surgeon: Newman Pies, MD;  Location: [place] [facility];  Service: Neurosurgery;  Laterality: N/A;   POLYPECTOMY     SKIN CANCER EXCISION      Social History   Socioeconomic History   Marital status: Married    Spouse name: Not on file   Number of children: Not on file   Years of education: Not on file   Highest education level: Bachelor's degree (e.g., BA, AB, BS)  Occupational History   Not on file  Tobacco Use   Smoking status: Former    Packs/day: 1.00    Years: 30.00    Additional pack years: 0.00    Total pack years: 30.00     Types: Cigarettes    Quit date: 03/13/1988    Years since quitting: 34.7   Smokeless tobacco: Current    Types: Chew  Substance and Sexual Activity   Alcohol use: No   Drug use: No   Sexual activity: Not on file  Other Topics Concern   Not on file  Social History Narrative   Not on file   Social Determinants of Health   Financial Resource Strain: Low Risk  (05/01/2022)   Overall Financial Resource Strain (CARDIA)    Difficulty of Paying Living Expenses: Not hard at all  Food Insecurity: No Food Insecurity (05/01/2022)   Hunger Vital Sign    Worried About Running Out of Food in the Last Year: Never true    Ran Out of Food in the Last Year: Never true  Transportation Needs: No Transportation Needs (05/01/2022)   PRAPARE - Hydrologist (Medical): No    Lack of Transportation (Non-Medical): No  Physical Activity: Unknown (05/01/2022)   Exercise Vital Sign    Days of Exercise per Week: 0 days    Minutes of Exercise per Session: Not on file  Stress: No Stress Concern Present (05/01/2022)   [place]    Feeling of Stress : Not at all  Social Connections: [facility] (05/01/2022)   Social Connection and Isolation Panel [NHANES]  Frequency of Communication with Friends and Family: More than three times a week    Frequency of Social Gatherings with Friends and Family: Not on file    Attends Religious Services: More than 4 times per year    Active Member of [employer] or Organizations: Yes    Attends Music therapist: More than 4 times per year    Marital Status: Married  Human resources officer Violence: Not on file    Family History  Problem Relation Age of Onset   Cancer Mother    Cancer Father 18       COLON   Heart attack Father    Heart disease Father    Heart disease Sister    CAD Sister    Heart disease Brother    CAD Brother    CAD Brother    Heart disease Brother      Current Outpatient Medications on File Prior to Visit  Medication Sig Dispense Refill   acetaminophen (TYLENOL) 500 MG tablet Take 500 mg by mouth every 6 (six) hours as needed for mild pain.     amLODipine (NORVASC) 2.5 MG tablet TAKE 1 TABLET DAILY 90 tablet 3   budesonide-formoterol (SYMBICORT) 160-4.5 MCG/ACT inhaler TAKE 2 PUFFS BY MOUTH TWICE A DAY 30.6 each 3   ELIQUIS 5 MG TABS tablet TAKE 1 TABLET BY MOUTH TWICE A DAY 180 tablet 1   finasteride (PROSCAR) 5 MG tablet TAKE 1 TABLET (5 MG TOTAL) BY MOUTH DAILY. 90 tablet 1   furosemide (LASIX) 40 MG tablet TAKE 1 TABLET EVERY DAY AS NEEDED FOR LEG SWELLING 90 tablet 0   gabapentin (NEURONTIN) 100 MG capsule TAKE 1 CAPSULE BY MOUTH IN MORNING THEN TAKE 2 CAPSULES BY MONTH AT NIGHT 90 capsule 0   glucosamine-chondroitin 500-400 MG tablet Take 2 tablets by mouth daily.     lansoprazole (PREVACID) 30 MG capsule TAKE 1 CAPSULE EVERY DAY AT 12 NOON. 90 capsule 3   lovastatin (MEVACOR) 20 MG tablet TAKE 1 TABLET BY MOUTH EVERYDAY AT BEDTIME 90 tablet 0   metoprolol succinate (TOPROL-XL) 50 MG 24 hr tablet Take 1 tablet (50 mg total) by mouth daily. Take with or immediately following a meal. 90 tablet 3   metroNIDAZOLE (METROCREAM) 0.75 % cream Apply topically daily. 45 g 2   potassium chloride (KLOR-CON) 10 MEQ tablet TAKE 2 TABLETS BY MOUTH EVERY DAY 60 tablet 5   tamsulosin (FLOMAX) 0.4 MG CAPS capsule Take 1 capsule (0.4 mg total) by mouth daily. 90 capsule 3   vitamin B-12 (CYANOCOBALAMIN) 500 MCG tablet Take 500 mcg by mouth daily.     VITAMIN D PO Take 400 Units by mouth daily.      No current facility-administered medications on file prior to visit.    Allergies  Allergen Reactions   Amoxicillin-Pot Clavulanate Diarrhea and Nausea Only   Losartan Other (See Comments) and Nausea And Vomiting    Elevated creatinine levels Elevated creatinine levels       Physical Exam There were no vitals filed for this visit. Estimated  body mass index is 24.91 kg/m as calculated from the following:   Height as of 07/25/22: 6\' 2"  (1.88 m).   Weight as of 07/25/22: 194 lb (88 kg).  EKG (optional): deferred due to virtual visit  GENERAL: alert, oriented, no acute distress detected; full vision exam deferred due to pandemic and/or virtual encounter  HEENT: atraumatic, conjunttiva clear, no obvious abnormalities on inspection of external nose and ears  NECK:  normal movements of the head and neck  LUNGS: on inspection no signs of respiratory distress, breathing rate appears normal, no obvious gross SOB, gasping or wheezing  CV: no obvious cyanosis  MS: moves all visible extremities without noticeable abnormality  PSYCH/NEURO: pleasant and cooperative, no obvious depression or anxiety, speech and thought processing grossly intact, Cognitive function grossly intact  Flowsheet Row Video Visit from 11/18/2022 in [place] at [place]  PHQ-9 Total Score 3           11/18/2022   11:55 AM 11/20/2021    4:39 PM 03/27/2020    4:16 PM 11/03/2017    9:43 AM 10/28/2016   10:41 AM  Depression screen PHQ 2/9  Decreased Interest 0 1 0 0 0  Down, Depressed, Hopeless 0 0 0 0   PHQ - 2 Score 0 1 0 0 0  Altered sleeping 1  0    Tired, decreased energy 1  1    Change in appetite 0  3    Feeling bad or failure about yourself  0  0    Trouble concentrating 1  1    Moving slowly or fidgety/restless 0  0    Suicidal thoughts 0  0    PHQ-9 Score 3  5    Difficult doing work/chores   Not difficult at all         04/08/2019    7:30 AM 03/27/2020    4:15 PM 12/25/2021    3:33 PM 05/01/2022    2:49 PM 11/18/2022   11:54 AM  Fall Risk  Falls in the past year?  0 1 1 0  Was there an injury with Fall?  0 1 1 0  Fall Risk Category Calculator  0 3 2 0  Fall Risk Category (Retired)  Low High Moderate   (RETIRED) Patient Fall Risk Level Moderate fall risk  Moderate fall risk    Patient at Risk for Falls Due to   History of  fall(s)  History of fall(s)  Fall risk Follow up   Falls evaluation completed  Falls evaluation completed  Has fallen against the doorframe a few times in the last year when going to the bathroom at night - caught himself. Turned too quick and it was dark.   SUMMARY AND PLAN:  Encounter for Medicare annual wellness exam   Discussed applicable health maintenance/preventive health measures and advised and referred or ordered per patient preferences:  Health Maintenance  Topic Date Due   DTaP/Tdap/Td (2 - Tdap) 08/29/2020, discussed, they are considering   COVID-19 Vaccine (5 - 2023-24 season) 12/04/2022 (Originally 04/25/2022), discussed - they know can get at pharmacy if they wish   Zoster Vaccines- Shingrix (1 of 2) 02/18/2023 (Originally 02/19/1951) - had already been deferred in epic, discussed and they are considering.    Medicare Annual Wellness (AWV)  11/18/2023   Pneumonia Vaccine 76+ Years old  Completed   INFLUENZA VACCINE  Completed   HPV VACCINES  Aged Out   Education and counseling on the following was provided based on the above review of health and a plan/checklist for the patient, along with additional information discussed, was provided for the patient in the patient instructions :  -Provided counseling and plan for increased risk of falling if applicable per above screening. He is willing to consider some safe home exercises - discussed and demonstrated how to do safely. Discussed using assistance and taking time when gets up before walking - particularly at  night. Also, discussed lighting at night for bathroom trips.  -Provided counseling and plan for function difficulties/ difficulties with ADLs if applicable per above screening. -Advised and counseled on maintaining healthy lifestyle - including the importance of a healthy diet, regular physical activity. -Advised and counseled on a whole foods based healthy diet and regular exercise: discussed a heart healthy whole foods  based diet at length. A summary of a healthy diet was provided in the Patient Instructions -He struggles with exercise due to chronic back pain. Discussed gentle and safe ways to do chair exercise as any standing exercise aggravate his back.  -Advise regular eye/dental exams -discussed memory issues with wife and patient and they plan to schedule visit with PCP to assess - sent message to schedulers to assist.  Follow up: see patient instructions   Patient Instructions  I really enjoyed getting to talk with you today! I am available on Tuesdays and Thursdays for virtual visits if you have any questions or concerns, or if I can be of any further assistance.   CHECKLIST FROM ANNUAL WELLNESS VISIT:  -Follow up (please call to schedule if not scheduled after visit):   -Inperson visit with your Primary Doctor office: Call to schedule in person visit for the memory concerns  -yearly for annual wellness visit with primary care office  Here is a list of your preventive care/health maintenance measures and the plan for each if any are due:  Health Maintenance  Topic Date Due   DTaP/Tdap/Td (2 - Tdap) 08/29/2020   COVID-19 Vaccine (5 - 2023-24 season) 12/04/2022 (Originally 04/25/2022)   Zoster Vaccines- Shingrix (1 of 2) 02/18/2023 (Originally 02/19/1951)   Medicare Annual Wellness (AWV)  11/18/2023   Pneumonia Vaccine 64+ Years old  Completed   INFLUENZA VACCINE  Completed   HPV VACCINES  Aged Out    -See a dentist at least yearly  -Get your eyes checked and then per your eye specialist's recommendations  -Other issues addressed today:   -I have included below further information regarding a healthy whole foods based diet, physical activity guidelines for adults, stress management and opportunities for social connections. I hope you find this information useful.    -----------------------------------------------------------------------------------------------------------------------------------------------------------------------------------------------------------------------------------------------------------  NUTRITION: -eat real food: lots of colorful vegetables (half the plate) and fruits -5-7 servings of vegetables and fruits per day (fresh or steamed is best), exp. 2 servings of vegetables with lunch and dinner and 2 servings of fruit per day. Berries and greens such as kale and collards are great choices.  -consume on a regular basis: whole grains (make sure first ingredient on label contains the word "whole"), fresh fruits, fish, nuts, seeds, healthy oils (such as olive oil, avocado oil, grape seed oil) -may eat small amounts of dairy and lean meat on occasion, but avoid processed meats such as ham, bacon, lunch meat, etc. -drink water -try to avoid fast food and pre-packaged foods, processed meat -most experts advise limiting sodium to < 2300mg  per day, should limit further is any chronic conditions such as high blood pressure, heart disease, diabetes, etc. The American Heart Association advised that < 1500mg  is is ideal -try to avoid foods that contain any ingredients with names you do not recognize  -try to avoid sugar/sweets (except for the natural sugar that occurs in fresh fruit) -try to avoid sweet drinks -try to avoid white rice, white bread, pasta (unless whole grain), white or yellow potatoes  EXERCISE GUIDELINES FOR ADULTS: -if you wish to increase your physical activity, do so  gradually and with the approval of your doctor -STOP and seek medical care immediately if you have any chest pain, chest discomfort or trouble breathing when starting or increasing exercise  -move and stretch your body, legs, feet and arms when sitting for long periods -Physical activity guidelines for optimal health in adults: -least 150 minutes per week of  aerobic exercise (can talk, but not sing) once approved by your doctor, 20-30 minutes of sustained activity or two 10 minute episodes of sustained activity every day.  -resistance training at least 2 days per week if approved by your doctor -balance exercises 3+ days per week:   Stand somewhere where you have something sturdy to hold onto if you lose balance.    1) lift up on toes, start with 5x per day and work up to 20x   2) stand and lift on leg straight out to the side so that foot is a few inches of the floor, start with 5x each side and work up to 20x each side   3) stand on one foot, start with 5 seconds each side and work up to 20 seconds on each side  If you need ideas or help with getting more active:  -Silver sneakers https://tools.silversneakers.com  -chair exercises we discussed for arms, legs, core. Pedal pusher.   STRESS MANAGEMENT: -can try meditating, or just sitting quietly with deep breathing while intentionally relaxing all parts of your body for 5 minutes daily -if you need further help with stress, anxiety or depression please follow up with your primary doctor or contact the wonderful folks at [facility]: [place]: -options in [place] if you wish to engage in more social and exercise related activities:  -Silver sneakers https://tools.silversneakers.com  -Check out the [place] 50+ section on the [place] [employer] (hiking clubs, book clubs, cards and games, chess, exercise classes, aquatic classes and much more) - see the website for details: https://www.[place].gov/departments/parks-recreation/active-adults50  -YouTube has lots of exercise videos for different ages and abilities as well  -[place] (a variety of indoor and outdoor inperson activities for adults). [phone]. [address].  -Virtual Online Classes (a variety of topics): see seniorplanet.org or call  [phone]  -consider volunteering at a school, hospice center, church, senior center or elsewhere           Lucretia Kern, DO

## 2022-11-18 NOTE — Patient Instructions (Addendum)
I really enjoyed getting to talk with you today! I am available on Tuesdays and Thursdays for virtual visits if you have any questions or concerns, or if I can be of any further assistance.   CHECKLIST FROM ANNUAL WELLNESS VISIT:  -Follow up (please call to schedule if not scheduled after visit):   -Inperson visit with your Primary Doctor office: Call to schedule in person visit for the memory concerns  -yearly for annual wellness visit with primary care office  Here is a list of your preventive care/health maintenance measures and the plan for each if any are due:  Health Maintenance  Topic Date Due   DTaP/Tdap/Td (2 - Tdap) 08/29/2020   COVID-19 Vaccine (5 - 2023-24 season) 12/04/2022 (Originally 04/25/2022)   Zoster Vaccines- Shingrix (1 of 2) 02/18/2023 (Originally 02/19/1951)   Medicare Annual Wellness (AWV)  11/18/2023   Pneumonia Vaccine 70+ Years old  Completed   INFLUENZA VACCINE  Completed   HPV VACCINES  Aged Out    -See a dentist at least yearly  -Get your eyes checked and then per your eye specialist's recommendations  -Other issues addressed today:   -I have included below further information regarding a healthy whole foods based diet, physical activity guidelines for adults, stress management and opportunities for social connections. I hope you find this information useful.   -----------------------------------------------------------------------------------------------------------------------------------------------------------------------------------------------------------------------------------------------------------  NUTRITION: -eat real food: lots of colorful vegetables (half the plate) and fruits -5-7 servings of vegetables and fruits per day (fresh or steamed is best), exp. 2 servings of vegetables with lunch and dinner and 2 servings of fruit per day. Berries and greens such as kale and collards are great choices.  -consume on a regular basis: whole grains  (make sure first ingredient on label contains the word "whole"), fresh fruits, fish, nuts, seeds, healthy oils (such as olive oil, avocado oil, grape seed oil) -may eat small amounts of dairy and lean meat on occasion, but avoid processed meats such as ham, bacon, lunch meat, etc. -drink water -try to avoid fast food and pre-packaged foods, processed meat -most experts advise limiting sodium to < 2300mg  per day, should limit further is any chronic conditions such as high blood pressure, heart disease, diabetes, etc. The American Heart Association advised that < 1500mg  is is ideal -try to avoid foods that contain any ingredients with names you do not recognize  -try to avoid sugar/sweets (except for the natural sugar that occurs in fresh fruit) -try to avoid sweet drinks -try to avoid white rice, white bread, pasta (unless whole grain), white or yellow potatoes  EXERCISE GUIDELINES FOR ADULTS: -if you wish to increase your physical activity, do so gradually and with the approval of your doctor -STOP and seek medical care immediately if you have any chest pain, chest discomfort or trouble breathing when starting or increasing exercise  -move and stretch your body, legs, feet and arms when sitting for long periods -Physical activity guidelines for optimal health in adults: -least 150 minutes per week of aerobic exercise (can talk, but not sing) once approved by your doctor, 20-30 minutes of sustained activity or two 10 minute episodes of sustained activity every day.  -resistance training at least 2 days per week if approved by your doctor -balance exercises 3+ days per week:   Stand somewhere where you have something sturdy to hold onto if you lose balance.    1) lift up on toes, start with 5x per day and work up to 20x   2) stand  and lift on leg straight out to the side so that foot is a few inches of the floor, start with 5x each side and work up to 20x each side   3) stand on one foot, start  with 5 seconds each side and work up to 20 seconds on each side  If you need ideas or help with getting more active:  -Silver sneakers https://tools.silversneakers.com  -chair exercises we discussed for arms, legs, core. Pedal pusher.   STRESS MANAGEMENT: -can try meditating, or just sitting quietly with deep breathing while intentionally relaxing all parts of your body for 5 minutes daily -if you need further help with stress, anxiety or depression please follow up with your primary doctor or contact the wonderful folks at [place]: [place]: -options in [place] if you wish to engage in more social and exercise related activities:  -Silver sneakers https://tools.silversneakers.com  -Check out the [place] 50+ section on the [place] [employer] (hiking clubs, book clubs, cards and games, chess, exercise classes, aquatic classes and much more) - see the website for details: https://www.[place].gov/departments/parks-recreation/active-adults50  -YouTube has lots of exercise videos for different ages and abilities as well  -[employer] (a variety of indoor and outdoor inperson activities for adults). (620)[phone]. [address]  -Virtual Online Classes (a variety of topics): see seniorplanet.org or call [phone]  -consider volunteering at a school, hospice center, church, senior center or elsewhere

## 2022-11-25 DIAGNOSIS — D0439 Carcinoma in situ of skin of other parts of face: Secondary | ICD-10-CM | POA: Diagnosis not present

## 2022-11-25 DIAGNOSIS — L578 Other skin changes due to chronic exposure to nonionizing radiation: Secondary | ICD-10-CM | POA: Diagnosis not present

## 2022-11-25 DIAGNOSIS — L570 Actinic keratosis: Secondary | ICD-10-CM | POA: Diagnosis not present

## 2022-12-03 DIAGNOSIS — D044 Carcinoma in situ of skin of scalp and neck: Secondary | ICD-10-CM | POA: Diagnosis not present

## 2022-12-03 DIAGNOSIS — C44329 Squamous cell carcinoma of skin of other parts of face: Secondary | ICD-10-CM | POA: Diagnosis not present

## 2022-12-03 DIAGNOSIS — D485 Neoplasm of uncertain behavior of skin: Secondary | ICD-10-CM | POA: Diagnosis not present

## 2022-12-04 ENCOUNTER — Other Ambulatory Visit: Payer: Self-pay | Admitting: Family Medicine

## 2022-12-08 ENCOUNTER — Other Ambulatory Visit: Payer: Self-pay | Admitting: Family Medicine

## 2022-12-10 ENCOUNTER — Other Ambulatory Visit: Payer: Self-pay | Admitting: Family Medicine

## 2022-12-10 ENCOUNTER — Telehealth: Payer: Self-pay | Admitting: Family Medicine

## 2022-12-10 MED ORDER — BUDESONIDE-FORMOTEROL FUMARATE 160-4.5 MCG/ACT IN AERO: INHALATION_SPRAY | RESPIRATORY_TRACT | 3 refills | Status: DC

## 2022-12-10 NOTE — Telephone Encounter (Signed)
Please resend budesonide-formoterol (SYMBICORT) 160-4.5 MCG/ACT inhaler rx the transmission failed  [id number] [place], [place] - [address] Phone: [phone]  Fax: [phone]

## 2022-12-10 NOTE — Telephone Encounter (Signed)
RX sent

## 2022-12-12 ENCOUNTER — Other Ambulatory Visit: Payer: Self-pay | Admitting: Family

## 2022-12-12 MED ORDER — BUDESONIDE-FORMOTEROL FUMARATE 80-4.5 MCG/ACT IN AERO: 2.0000 | INHALATION_SPRAY | Freq: Two times a day (BID) | RESPIRATORY_TRACT | 3 refills | Status: DC

## 2023-01-14 DIAGNOSIS — C4442 Squamous cell carcinoma of skin of scalp and neck: Secondary | ICD-10-CM | POA: Diagnosis not present

## 2023-01-14 DIAGNOSIS — C44329 Squamous cell carcinoma of skin of other parts of face: Secondary | ICD-10-CM | POA: Diagnosis not present

## 2023-01-14 DIAGNOSIS — D485 Neoplasm of uncertain behavior of skin: Secondary | ICD-10-CM | POA: Diagnosis not present

## 2023-02-02 ENCOUNTER — Encounter: Payer: Self-pay | Admitting: Family Medicine

## 2023-02-02 ENCOUNTER — Ambulatory Visit (INDEPENDENT_AMBULATORY_CARE_PROVIDER_SITE_OTHER): Payer: Medicare (Managed Care) | Admitting: Family Medicine

## 2023-02-02 VITALS — BP 128/52 | HR 67 | Temp 97.7°F | Ht 74.0 in | Wt 186.6 lb

## 2023-02-02 DIAGNOSIS — I10 Essential (primary) hypertension: Secondary | ICD-10-CM | POA: Diagnosis not present

## 2023-02-02 DIAGNOSIS — N183 Chronic kidney disease, stage 3 unspecified: Secondary | ICD-10-CM

## 2023-02-02 DIAGNOSIS — E7800 Pure hypercholesterolemia, unspecified: Secondary | ICD-10-CM | POA: Diagnosis not present

## 2023-02-02 DIAGNOSIS — R739 Hyperglycemia, unspecified: Secondary | ICD-10-CM | POA: Diagnosis not present

## 2023-02-02 DIAGNOSIS — I480 Paroxysmal atrial fibrillation: Secondary | ICD-10-CM | POA: Diagnosis not present

## 2023-02-02 DIAGNOSIS — Z79899 Other long term (current) drug therapy: Secondary | ICD-10-CM | POA: Diagnosis not present

## 2023-02-02 NOTE — Patient Instructions (Signed)
Set up fasting labs for tomorrow morning.

## 2023-02-02 NOTE — Progress Notes (Signed)
Established Patient Office Visit  Subjective   Patient ID: [name], male    DOB: 01/26/1932  Age: 89 y.o. MRN: [id number]  Chief Complaint  Patient presents with   Medical Management of Chronic Issues    HPI   [name] is seen for chronic management of several issues as below.  He takes Symbicort or has in the past but has had recent issues with lack of insurance coverage.  He has had frequent wheezing in the past and initially seem to obtain some benefit when using this daily.  However, after running out of Symbicort a few weeks ago and unable to get covered he has not seen any worsening of symptoms since stopping this.  He questions whether it is providing any benefit.  He did not get any alternative medication names from his insurance company.  He has hyperlipidemia and has been for several years on lovastatin.  Needs follow-up labs.  He has history of atrial fibrillation and takes Eliquis.  Due for follow-up labs.  Hypertension currently treated with just low-dose amlodipine 2.5 mg daily.  Home blood pressures mostly 115-130 range systolic.  He had some mild hyperglycemia in the past.  No recent A1c.  No polyuria or polydipsia.  Past Medical History:  Diagnosis Date   Actinic keratosis 03/13/2009   Arthritis    BENIGN PROSTATIC HYPERTROPHY, HX OF 04/07/2009   CARCINOMA, SKIN, SQUAMOUS CELL 09/11/2009   COLONIC POLYPS, HX OF 03/13/2009   Essential hypertension    GERD 03/13/2009   Hyperlipidemia    INSOMNIA, CHRONIC 09/11/2009   Persistent atrial fibrillation (HCC) 04/07/2009   a. s/p afib ablation at [facility] x 2 in 2010.   POLYPECTOMY, HX OF 04/07/2009   Premature atrial contractions    PULMONARY NODULE 09/11/2009   PVC's (premature ventricular contractions)    Rosacea 03/13/2009   SKIN CANCER, HX OF 03/13/2009   Past Surgical History:  Procedure Laterality Date   ABLATION  2010   x2   COLONOSCOPY W/ BIOPSIES AND POLYPECTOMY     EYE SURGERY Bilateral     cataracts   LUMBAR DISC SURGERY     RUPTURE   LUMBAR LAMINECTOMY/DECOMPRESSION MICRODISCECTOMY N/A 04/20/2014   Procedure: LUMBAR TWO-THREE, LUMBAR THREE-FOUR, LUMAR FOUR-FIVE LAMINECTOMIES;  Surgeon: Tressie Stalker, MD;  Location: [facility];  Service: Neurosurgery;  Laterality: N/A;   POLYPECTOMY     SKIN CANCER EXCISION      reports that he quit smoking about 34 years ago. His smoking use included cigarettes. He has a 30.00 pack-year smoking history. His smokeless tobacco use includes chew. He reports that he does not drink alcohol and does not use drugs. family history includes CAD in his brother, brother, and sister; Cancer in his mother; Cancer (age of onset: 59) in his father; Heart attack in his father; Heart disease in his brother, brother, father, and sister. Allergies  Allergen Reactions   Amoxicillin-Pot Clavulanate Diarrhea and Nausea Only   Losartan Other (See Comments) and Nausea And Vomiting    Elevated creatinine levels Elevated creatinine levels     Review of Systems  Constitutional:  Negative for chills and fever.  Eyes:  Negative for blurred vision.  Respiratory:  Negative for shortness of breath.   Cardiovascular:  Negative for chest pain.  Gastrointestinal:  Negative for abdominal pain.  Neurological:  Negative for dizziness, weakness and headaches.      Objective:     BP (!) 128/52 (BP Location: Left Arm, Cuff Size: Normal)  Pulse 67   Temp 97.7 F (36.5 C) (Oral)   Ht 6\' 2"  (1.88 m)   Wt 186 lb 9.6 oz (84.6 kg)   SpO2 98%   BMI 23.96 kg/m    Physical Exam Vitals reviewed.  Constitutional:      Appearance: He is well-developed.  HENT:     Right Ear: External ear normal.     Left Ear: External ear normal.  Eyes:     Pupils: Pupils are equal, round, and reactive to light.  Neck:     Thyroid: No thyromegaly.  Cardiovascular:     Rate and Rhythm: Normal rate and regular rhythm.  Pulmonary:     Effort: Pulmonary effort is normal. No  respiratory distress.     Breath sounds: Normal breath sounds. No wheezing or rales.  Musculoskeletal:     Cervical back: Neck supple.     Right lower leg: No edema.     Left lower leg: No edema.     Comments: Support hose on bilaterally but no obvious edema.  Neurological:     Mental Status: He is alert and oriented to person, place, and time.      No results found for any visits on 02/02/23.  Last CBC Lab Results  Component Value Date   WBC 7.9 02/21/2022   HGB 13.2 02/21/2022   HCT 40.1 02/21/2022   MCV 99 (H) 02/21/2022   MCH 32.6 02/21/2022   RDW 14.2 02/21/2022   PLT 235 02/21/2022   Last metabolic panel Lab Results  Component Value Date   GLUCOSE 204 (H) 05/27/2022   NA 143 05/27/2022   K 4.5 05/27/2022   CL 102 05/27/2022   CO2 23 05/27/2022   BUN 28 (H) 05/27/2022   CREATININE 1.38 (H) 05/27/2022   EGFR 49 (L) 05/27/2022   CALCIUM 9.1 05/27/2022   PROT 6.1 11/15/2020   ALBUMIN 4.2 11/15/2020   LABGLOB 1.9 11/15/2020   AGRATIO 2.2 11/15/2020   BILITOT 0.5 11/15/2020   ALKPHOS 69 11/15/2020   AST 13 11/15/2020   ALT 7 11/15/2020   ANIONGAP 10 04/07/2019   Last lipids Lab Results  Component Value Date   CHOL 131 11/15/2020   HDL 33 (L) 11/15/2020   LDLCALC 74 11/15/2020   LDLDIRECT 91.0 09/19/2020   TRIG 138 11/15/2020   CHOLHDL 4.0 11/15/2020   Last thyroid functions Lab Results  Component Value Date   TSH 2.32 03/27/2020      The ASCVD Risk score (Arnett DK, et al., 2019) failed to calculate for the following reasons:   The 2019 ASCVD risk score is only valid for ages 73 to 24    Assessment & Plan:   #1 hypertension.  Initial reading was elevated but repeated sitting 140/62 and standing 128/52.  Continue amlodipine 2.5 mg once daily.  Watch sodium intake.  Continue close home monitoring.  #2 history of mild hyperglycemia.  No documented diabetes.  Recheck A1c  #3 history of paroxysmal atrial fibrillation.  He had a couple.  His  ablation procedures.  Maintained on Eliquis currently.  No clinical evidence for A-fib at this time.  Check CBC and CMP  #4 hyperlipidemia treated with lovastatin.  Due for follow-up labs.  Nonfasting today.  Will schedule follow-up fasting labs for lipid and CMP  #5 history of recurrent wheezing previously maintained on Symbicort but unable to get covered by insurance.  He has not seen any worsening of symptoms since stopping Symbicort and was getting questionable benefit.  He would like to observe for now.  If he has increased wheezing or reactive airway issues consider checking into coverage for alternative such as Advair or Breo   Return in about 3 months (around 05/05/2023).    Evelena Peat, MD

## 2023-02-03 ENCOUNTER — Other Ambulatory Visit (INDEPENDENT_AMBULATORY_CARE_PROVIDER_SITE_OTHER): Payer: Medicare (Managed Care)

## 2023-02-03 DIAGNOSIS — R710 Precipitous drop in hematocrit: Secondary | ICD-10-CM

## 2023-02-03 DIAGNOSIS — R739 Hyperglycemia, unspecified: Secondary | ICD-10-CM | POA: Diagnosis not present

## 2023-02-03 DIAGNOSIS — E7800 Pure hypercholesterolemia, unspecified: Secondary | ICD-10-CM

## 2023-02-03 DIAGNOSIS — Z79899 Other long term (current) drug therapy: Secondary | ICD-10-CM | POA: Diagnosis not present

## 2023-02-03 LAB — CBC WITH DIFFERENTIAL/PLATELET
Basophils Absolute: 0 10*3/uL (ref 0.0–0.1)
Basophils Relative: 0.3 % (ref 0.0–3.0)
Eosinophils Absolute: 0.2 10*3/uL (ref 0.0–0.7)
Eosinophils Relative: 2.6 % (ref 0.0–5.0)
HCT: 37.6 % — ABNORMAL LOW (ref 39.0–52.0)
Hemoglobin: 11.8 g/dL — ABNORMAL LOW (ref 13.0–17.0)
Lymphocytes Relative: 35.9 % (ref 12.0–46.0)
Lymphs Abs: 2.5 10*3/uL (ref 0.7–4.0)
MCHC: 31.5 g/dL (ref 30.0–36.0)
MCV: 96.9 fl (ref 78.0–100.0)
Monocytes Absolute: 0.9 10*3/uL (ref 0.1–1.0)
Monocytes Relative: 13.4 % — ABNORMAL HIGH (ref 3.0–12.0)
Neutro Abs: 3.4 10*3/uL (ref 1.4–7.7)
Neutrophils Relative %: 47.8 % (ref 43.0–77.0)
Platelets: 224 10*3/uL (ref 150.0–400.0)
RBC: 3.88 Mil/uL — ABNORMAL LOW (ref 4.22–5.81)
RDW: 17 % — ABNORMAL HIGH (ref 11.5–15.5)
WBC: 7.1 10*3/uL (ref 4.0–10.5)

## 2023-02-03 LAB — LIPID PANEL
Cholesterol: 116 mg/dL (ref 0–200)
HDL: 30.6 mg/dL — ABNORMAL LOW (ref >39.00)
LDL Cholesterol: 53 mg/dL (ref 0–99)
NonHDL: 85.86
Total CHOL/HDL Ratio: 4
Triglycerides: 165 mg/dL — ABNORMAL HIGH (ref 0.0–149.0)
VLDL: 33 mg/dL (ref 0.0–40.0)

## 2023-02-03 LAB — COMPREHENSIVE METABOLIC PANEL
ALT: 6 U/L (ref 0–53)
AST: 15 U/L (ref 0–37)
Albumin: 3.9 g/dL (ref 3.5–5.2)
Alkaline Phosphatase: 51 U/L (ref 39–117)
BUN: 25 mg/dL — ABNORMAL HIGH (ref 6–23)
CO2: 27 mEq/L (ref 19–32)
Calcium: 9.1 mg/dL (ref 8.4–10.5)
Chloride: 105 mEq/L (ref 96–112)
Creatinine, Ser: 1.33 mg/dL (ref 0.40–1.50)
GFR: 47.58 mL/min — ABNORMAL LOW (ref >60.00)
Glucose, Bld: 98 mg/dL (ref 70–99)
Potassium: 4.3 mEq/L (ref 3.5–5.1)
Sodium: 140 mEq/L (ref 135–145)
Total Bilirubin: 0.4 mg/dL (ref 0.2–1.2)
Total Protein: 6.2 g/dL (ref 6.0–8.3)

## 2023-02-03 LAB — HEMOGLOBIN A1C: Hgb A1c MFr Bld: 5.9 % (ref 4.6–6.5)

## 2023-02-19 DIAGNOSIS — L570 Actinic keratosis: Secondary | ICD-10-CM | POA: Diagnosis not present

## 2023-02-19 DIAGNOSIS — D485 Neoplasm of uncertain behavior of skin: Secondary | ICD-10-CM | POA: Diagnosis not present

## 2023-02-19 DIAGNOSIS — L814 Other melanin hyperpigmentation: Secondary | ICD-10-CM | POA: Diagnosis not present

## 2023-02-19 DIAGNOSIS — D225 Melanocytic nevi of trunk: Secondary | ICD-10-CM | POA: Diagnosis not present

## 2023-02-19 DIAGNOSIS — C44629 Squamous cell carcinoma of skin of left upper limb, including shoulder: Secondary | ICD-10-CM | POA: Diagnosis not present

## 2023-02-19 DIAGNOSIS — Z08 Encounter for follow-up examination after completed treatment for malignant neoplasm: Secondary | ICD-10-CM | POA: Diagnosis not present

## 2023-02-19 DIAGNOSIS — Z85828 Personal history of other malignant neoplasm of skin: Secondary | ICD-10-CM | POA: Diagnosis not present

## 2023-02-19 DIAGNOSIS — L821 Other seborrheic keratosis: Secondary | ICD-10-CM | POA: Diagnosis not present

## 2023-02-20 ENCOUNTER — Other Ambulatory Visit: Payer: Self-pay | Admitting: Family Medicine

## 2023-02-20 DIAGNOSIS — R600 Localized edema: Secondary | ICD-10-CM

## 2023-03-04 DIAGNOSIS — D0439 Carcinoma in situ of skin of other parts of face: Secondary | ICD-10-CM | POA: Diagnosis not present

## 2023-03-05 ENCOUNTER — Other Ambulatory Visit: Payer: Medicare (Managed Care)

## 2023-03-05 DIAGNOSIS — R710 Precipitous drop in hematocrit: Secondary | ICD-10-CM

## 2023-03-05 LAB — CBC WITH DIFFERENTIAL/PLATELET
Basophils Absolute: 0 10*3/uL (ref 0.0–0.1)
Basophils Relative: 0.6 % (ref 0.0–3.0)
Eosinophils Absolute: 0.1 10*3/uL (ref 0.0–0.7)
Eosinophils Relative: 2.2 % (ref 0.0–5.0)
HCT: 37.4 % — ABNORMAL LOW (ref 39.0–52.0)
Hemoglobin: 11.9 g/dL — ABNORMAL LOW (ref 13.0–17.0)
Lymphocytes Relative: 38 % (ref 12.0–46.0)
Lymphs Abs: 2.4 10*3/uL (ref 0.7–4.0)
MCHC: 31.9 g/dL (ref 30.0–36.0)
MCV: 97.3 fl (ref 78.0–100.0)
Monocytes Absolute: 0.9 10*3/uL (ref 0.1–1.0)
Monocytes Relative: 13.8 % — ABNORMAL HIGH (ref 3.0–12.0)
Neutro Abs: 2.9 10*3/uL (ref 1.4–7.7)
Neutrophils Relative %: 45.4 % (ref 43.0–77.0)
Platelets: 213 10*3/uL (ref 150.0–400.0)
RBC: 3.85 Mil/uL — ABNORMAL LOW (ref 4.22–5.81)
RDW: 18.4 % — ABNORMAL HIGH (ref 11.5–15.5)
WBC: 6.3 10*3/uL (ref 4.0–10.5)

## 2023-03-05 LAB — VITAMIN B12: Vitamin B-12: >1500 pg/mL — ABNORMAL HIGH (ref 211–911)

## 2023-03-06 LAB — IRON,TIBC AND FERRITIN PANEL
%SAT: 26 % (calc) (ref 20–48)
Ferritin: 28 ng/mL (ref 24–380)
Iron: 90 ug/dL (ref 50–180)
TIBC: 342 mcg/dL (calc) (ref 250–425)

## 2023-03-09 ENCOUNTER — Other Ambulatory Visit: Payer: Self-pay | Admitting: Family Medicine

## 2023-03-09 ENCOUNTER — Other Ambulatory Visit: Payer: Self-pay | Admitting: Cardiology

## 2023-03-10 DIAGNOSIS — L989 Disorder of the skin and subcutaneous tissue, unspecified: Secondary | ICD-10-CM | POA: Diagnosis not present

## 2023-03-10 DIAGNOSIS — C44629 Squamous cell carcinoma of skin of left upper limb, including shoulder: Secondary | ICD-10-CM | POA: Diagnosis not present

## 2023-03-12 ENCOUNTER — Other Ambulatory Visit: Payer: Self-pay | Admitting: Family Medicine

## 2023-03-19 ENCOUNTER — Telehealth: Payer: Self-pay | Admitting: Family Medicine

## 2023-03-19 MED ORDER — BUDESONIDE-FORMOTEROL FUMARATE 80-4.5 MCG/ACT IN AERO: 2.0000 | INHALATION_SPRAY | Freq: Two times a day (BID) | RESPIRATORY_TRACT | 3 refills | Status: DC

## 2023-03-19 NOTE — Telephone Encounter (Signed)
Rx sent 

## 2023-03-19 NOTE — Telephone Encounter (Signed)
Prescription Request  03/19/2023  LOV: 02/02/2023  What is the name of the medication or equipment? budesonide-formoterol (BREYNA) 80-4.5 MCG/ACT inhaler   Have you contacted your pharmacy to request a refill? No   Which pharmacy would you like this sent to?  [id number] [place], [place] - [address] [place] [address] Phone: [phone] Fax: [phone]    Patient notified that their request is being sent to the clinical staff for review and that they should receive a response within 2 business days.   Please advise at Mobile [phone] (mobile)

## 2023-03-20 MED ORDER — BUDESONIDE-FORMOTEROL FUMARATE 80-4.5 MCG/ACT IN AERO: 2.0000 | INHALATION_SPRAY | Freq: Two times a day (BID) | RESPIRATORY_TRACT | 3 refills | Status: DC

## 2023-03-20 NOTE — Telephone Encounter (Signed)
[facility]

## 2023-03-20 NOTE — Telephone Encounter (Signed)
Spouse called to say this Rx is should be sent via the mail order.   [facility]   Spouse stated mail order will fax request.   No fax has been received yet.

## 2023-03-20 NOTE — Addendum Note (Signed)
Addended by: Christy Sartorius on: 03/20/2023 03:30 PM   Modules accepted: Orders

## 2023-03-31 DIAGNOSIS — L928 Other granulomatous disorders of the skin and subcutaneous tissue: Secondary | ICD-10-CM | POA: Diagnosis not present

## 2023-04-01 ENCOUNTER — Other Ambulatory Visit: Payer: Self-pay | Admitting: Cardiology

## 2023-04-06 ENCOUNTER — Telehealth: Payer: Self-pay | Admitting: *Deleted

## 2023-04-06 NOTE — Telephone Encounter (Signed)
   Pre-operative Risk Assessment    Patient Name: [name]  DOB: 02-03-1932 MRN: [id number]      Request for Surgical Clearance    Procedure:   L5 - S1 ES1  Date of Surgery:  Clearance TBD                                 Surgeon:  Dr. Aileen Fass Surgeon's Group or Practice Name:  [facility] Phone number:  [phone] Fax number:  [phone]   Type of Clearance Requested:   - Medical  - Pharmacy:  Hold Apixaban (Eliquis) 3 days prior.   Type of Anesthesia:  Not Indicated   Additional requests/questions:    Signed, Emmit Pomfret   04/06/2023, 12:59 PM

## 2023-04-07 NOTE — Telephone Encounter (Signed)
Left msg for patient to return call.

## 2023-04-07 NOTE — Telephone Encounter (Signed)
   Name: [name]  DOB: [birth year]  MRN: [id number]  Primary Cardiologist: Armanda Magic, MD  Chart reviewed as part of pre-operative protocol coverage. Because of [name]'s past medical history and time since last visit, he will require a follow-up telephone visit in order to better assess preoperative cardiovascular risk.  Pre-op covering staff: - Please schedule appointment and call patient to inform them. If patient already had an upcoming appointment within acceptable timeframe, please add "pre-op clearance" to the appointment notes so provider is aware. - Please contact requesting surgeon's office via preferred method (i.e, phone, fax) to inform them of need for appointment prior to surgery.  Per office protocol, patient can hold Eliquis for 3 days prior to procedure.   Sharlene Dory, PA-C  04/07/2023, 8:46 AM

## 2023-04-07 NOTE — Telephone Encounter (Signed)
Patient with diagnosis of afib on Eliquis for anticoagulation.    Procedure: L5-S1 ESI Date of procedure: TBD  CHA2DS2-VASc Score = 3  This indicates a 3.2% annual risk of stroke. The patient's score is based upon: CHF History: 0 HTN History: 1 Diabetes History: 0 Stroke History: 0 Vascular Disease History: 0 Age Score: 2 Gender Score: 0   CrCl 13mL/min Platelet count 213K  Per office protocol, patient can hold Eliquis for 3 days prior to procedure.    **This guidance is not considered finalized until pre-operative APP has relayed final recommendations.**

## 2023-04-08 ENCOUNTER — Other Ambulatory Visit: Payer: Self-pay | Admitting: Family Medicine

## 2023-04-08 NOTE — Telephone Encounter (Signed)
2nd attempt to reach pt regarding surgical clearance and the need for an TELEVISIT OFFICE appointment.  Left pt a detailed message to call back and get that scheduled.

## 2023-04-09 DIAGNOSIS — D225 Melanocytic nevi of trunk: Secondary | ICD-10-CM | POA: Diagnosis not present

## 2023-04-09 DIAGNOSIS — D485 Neoplasm of uncertain behavior of skin: Secondary | ICD-10-CM | POA: Diagnosis not present

## 2023-04-09 DIAGNOSIS — Z85820 Personal history of malignant melanoma of skin: Secondary | ICD-10-CM | POA: Diagnosis not present

## 2023-04-09 DIAGNOSIS — L821 Other seborrheic keratosis: Secondary | ICD-10-CM | POA: Diagnosis not present

## 2023-04-09 DIAGNOSIS — Z08 Encounter for follow-up examination after completed treatment for malignant neoplasm: Secondary | ICD-10-CM | POA: Diagnosis not present

## 2023-04-09 DIAGNOSIS — Z85828 Personal history of other malignant neoplasm of skin: Secondary | ICD-10-CM | POA: Diagnosis not present

## 2023-04-09 DIAGNOSIS — L570 Actinic keratosis: Secondary | ICD-10-CM | POA: Diagnosis not present

## 2023-04-09 DIAGNOSIS — L814 Other melanin hyperpigmentation: Secondary | ICD-10-CM | POA: Diagnosis not present

## 2023-04-09 DIAGNOSIS — C44519 Basal cell carcinoma of skin of other part of trunk: Secondary | ICD-10-CM | POA: Diagnosis not present

## 2023-04-13 ENCOUNTER — Telehealth: Payer: Self-pay

## 2023-04-13 ENCOUNTER — Telehealth: Payer: Self-pay | Admitting: Cardiology

## 2023-04-13 NOTE — Telephone Encounter (Signed)
Pt is scheduled 08/22 at 3pm. Med rec and consent done.     Patient Consent for Virtual Visit        [name] has provided verbal consent on 04/13/2023 for a virtual visit (video or telephone).   CONSENT FOR VIRTUAL VISIT FOR:  [name]  By participating in this virtual visit I agree to the following:  I hereby voluntarily request, consent and authorize [facility] and its employed or contracted physicians, physician assistants, nurse practitioners or other licensed health care professionals (the Practitioner), to provide me with telemedicine health care services (the "Services") as deemed necessary by the treating Practitioner. I acknowledge and consent to receive the Services by the Practitioner via telemedicine. I understand that the telemedicine visit will involve communicating with the Practitioner through live audiovisual communication technology and the disclosure of certain medical information by electronic transmission. I acknowledge that I have been given the opportunity to request an in-person assessment or other available alternative prior to the telemedicine visit and am voluntarily participating in the telemedicine visit.  I understand that I have the right to withhold or withdraw my consent to the use of telemedicine in the course of my care at any time, without affecting my right to future care or treatment, and that the Practitioner or I may terminate the telemedicine visit at any time. I understand that I have the right to inspect all information obtained and/or recorded in the course of the telemedicine visit and may receive copies of available information for a reasonable fee.  I understand that some of the potential risks of receiving the Services via telemedicine include:  Delay or interruption in medical evaluation due to technological equipment failure or disruption; Information transmitted may not be sufficient (e.g. poor resolution of images) to  allow for appropriate medical decision making by the Practitioner; and/or  In rare instances, security protocols could fail, causing a breach of personal health information.  Furthermore, I acknowledge that it is my responsibility to provide information about my medical history, conditions and care that is complete and accurate to the best of my ability. I acknowledge that Practitioner's advice, recommendations, and/or decision may be based on factors not within their control, such as incomplete or inaccurate data provided by me or distortions of diagnostic images or specimens that may result from electronic transmissions. I understand that the practice of medicine is not an exact science and that Practitioner makes no warranties or guarantees regarding treatment outcomes. I acknowledge that a copy of this consent can be made available to me via my patient portal [facility] MyChart), or I can request a printed copy by calling the office of  [facility].    I understand that my insurance will be billed for this visit.   I have read or had this consent read to me. I understand the contents of this consent, which adequately explains the benefits and risks of the Services being provided via telemedicine.  I have been provided ample opportunity to ask questions regarding this consent and the Services and have had my questions answered to my satisfaction. I give my informed consent for the services to be provided through the use of telemedicine in my medical care

## 2023-04-13 NOTE — Telephone Encounter (Signed)
Pt is scheduled 08/22 at 3pm. Med rec and consent done.

## 2023-04-13 NOTE — Telephone Encounter (Signed)
3rd and final attempt to schedule tele appt. I will update surgeons office and remove from pool.

## 2023-04-13 NOTE — Telephone Encounter (Signed)
Pt's wife returning call regarding appt for Clearance. Please advise

## 2023-04-16 ENCOUNTER — Ambulatory Visit: Payer: Medicare (Managed Care) | Attending: Cardiovascular Disease

## 2023-04-16 DIAGNOSIS — Z01810 Encounter for preprocedural cardiovascular examination: Secondary | ICD-10-CM | POA: Diagnosis not present

## 2023-04-16 NOTE — Progress Notes (Signed)
Virtual Visit via Telephone Note   Because of [name]'s co-morbid illnesses, he is at least at moderate risk for complications without adequate follow up.  This format is felt to be most appropriate for this patient at this time.  The patient did not have access to video technology/had technical difficulties with video requiring transitioning to audio format only (telephone).  All issues noted in this document were discussed and addressed.  No physical exam could be performed with this format.  Please refer to the patient's chart for his consent to telehealth for [facility].  Evaluation Performed:  Preoperative cardiovascular risk assessment _____________   Date:  04/16/2023   Patient ID:  [name], DOB [birth year], MRN [id number] Patient Location:  Home Provider location:   Office  Primary Care Provider:  Kristian Covey, MD Primary Cardiologist:  Armanda Magic, MD  Chief Complaint / Patient Profile   89 y.o. y/o male with a h/o  persistent atrial fibrillation s/p afib ablation at [facility] x 2 in 2010, HTN, hyperlipidemia, PACs/PVCs (by monitor 2017), CKD stage III  who is pending L5-S1 ESI and presents today for telephonic preoperative cardiovascular risk assessment.  History of Present Illness    [name] is a 89 y.o. male who presents via audio/video conferencing for a telehealth visit today.  Pt was last seen in cardiology clinic on 07/25/2022 by Dr. Mayford Knife.  At that time [name] was doing well .  The patient is now pending procedure as outlined above. Since his last visit, he reported doing well with some occasional episodes of dizziness related to his medications.  He also noted some atypical chest pain that was felt to be related to GI.He also reported lower extremity swelling and was encouraged to elevate extremities and use TED hose.  He reports since his previous visit he continues to have lower extremity swelling especially in his left  ankle and uses compression stockings but not consistently.  He also continues to have balance problems and dizziness which limits his physical activity.  He was able to complete 4 METS of activity but is overall sedentary regarding his daily function.  He denies chest pain, shortness of breath, lower extremity edema, fatigue, palpitations, melena, hematuria, hemoptysis, diaphoresis, weakness, presyncope, syncope, orthopnea, and PND.    Past Medical History    Past Medical History:  Diagnosis Date   Actinic keratosis 03/13/2009   Arthritis    BENIGN PROSTATIC HYPERTROPHY, HX OF 04/07/2009   CARCINOMA, SKIN, SQUAMOUS CELL 09/11/2009   COLONIC POLYPS, HX OF 03/13/2009   Essential hypertension    GERD 03/13/2009   Hyperlipidemia    INSOMNIA, CHRONIC 09/11/2009   Persistent atrial fibrillation (HCC) 04/07/2009   a. s/p afib ablation at [facility] x 2 in 2010.   POLYPECTOMY, HX OF 04/07/2009   Premature atrial contractions    PULMONARY NODULE 09/11/2009   PVC's (premature ventricular contractions)    Rosacea 03/13/2009   SKIN CANCER, HX OF 03/13/2009   Past Surgical History:  Procedure Laterality Date   ABLATION  2010   x2   COLONOSCOPY W/ BIOPSIES AND POLYPECTOMY     EYE SURGERY Bilateral    cataracts   LUMBAR DISC SURGERY     RUPTURE   LUMBAR LAMINECTOMY/DECOMPRESSION MICRODISCECTOMY N/A 04/20/2014   Procedure: LUMBAR TWO-THREE, LUMBAR THREE-FOUR, LUMAR FOUR-FIVE LAMINECTOMIES;  Surgeon: Tressie Stalker, MD;  Location: [facility];  Service: Neurosurgery;  Laterality: N/A;   POLYPECTOMY     SKIN CANCER  EXCISION      Allergies  Allergies  Allergen Reactions   Amoxicillin-Pot Clavulanate Diarrhea and Nausea Only   Losartan Other (See Comments) and Nausea And Vomiting    Elevated creatinine levels Elevated creatinine levels    Home Medications    Prior to Admission medications   Medication Sig Start Date End Date Taking? Authorizing Provider  acetaminophen (TYLENOL) 500 MG tablet  Take 500 mg by mouth every 6 (six) hours as needed for mild pain.    [provider]  amLODipine (NORVASC) 2.5 MG tablet TAKE 1 TABLET EVERY DAY 03/10/23   Burchette, Elberta Fortis, MD  budesonide-formoterol (BREYNA) 80-4.5 MCG/ACT inhaler Inhale 2 puffs into the lungs in the morning and at bedtime. 03/20/23   Burchette, Elberta Fortis, MD  ELIQUIS 5 MG TABS tablet TAKE 1 TABLET BY MOUTH TWICE A DAY 10/23/22   Turner, Cornelious Bryant, MD  finasteride (PROSCAR) 5 MG tablet TAKE 1 TABLET (5 MG TOTAL) BY MOUTH DAILY. 03/12/23   Burchette, Elberta Fortis, MD  furosemide (LASIX) 40 MG tablet TAKE 1 TABLET EVERY DAY AS NEEDED FOR LEG SWELLING 02/20/23   Burchette, Elberta Fortis, MD  glucosamine-chondroitin 500-400 MG tablet Take 2 tablets by mouth daily.    [provider]  lansoprazole (PREVACID) 30 MG capsule TAKE 1 CAPSULE EVERY DAY AT 12 NOON. 04/01/23   Turner, Cornelious Bryant, MD  lovastatin (MEVACOR) 20 MG tablet TAKE 1 TABLET BY MOUTH EVERYDAY AT BEDTIME 04/09/23   Burchette, Elberta Fortis, MD  metoprolol succinate (TOPROL-XL) 50 MG 24 hr tablet TAKE 1 TABLET BY MOUTH DAILY. TAKE WITH OR IMMEDIATELY FOLLOWING A MEAL. 03/09/23   Quintella Reichert, MD  metroNIDAZOLE (METROCREAM) 0.75 % cream Apply topically daily. 12/25/21   Burchette, Elberta Fortis, MD  potassium chloride (KLOR-CON) 10 MEQ tablet TAKE 2 TABLETS BY MOUTH EVERY DAY 10/06/22   Burchette, Elberta Fortis, MD  tamsulosin (FLOMAX) 0.4 MG CAPS capsule Take 1 capsule (0.4 mg total) by mouth daily. 09/06/21   Burchette, Elberta Fortis, MD  vitamin B-12 (CYANOCOBALAMIN) 500 MCG tablet Take 500 mcg by mouth daily.    [provider]  VITAMIN D PO Take 400 Units by mouth daily.     [provider]    Physical Exam    Vital Signs:  [name] does not have vital signs available for review today.  Given telephonic nature of communication, physical exam is limited. AAOx3. NAD. Normal affect.  Speech and respirations are unlabored.  Accessory Clinical Findings     None  Assessment & Plan    1.  Preoperative Cardiovascular Risk Assessment: -Patient's RCRI score is 0.4%  The patient affirms he has been doing well without any new cardiac symptoms. They are able to achieve 4 METS without cardiac limitations. Therefore, based on ACC/AHA guidelines, the patient would be at acceptable risk for the planned procedure without further cardiovascular testing. The patient was advised that if he develops new symptoms prior to surgery to contact our office to arrange for a follow-up visit, and he verbalized understanding.   The patient was advised that if he develops new symptoms prior to surgery to contact our office to arrange for a follow-up visit, and he verbalized understanding.   Patient can hold Eliquis 3 days prior to procedure   A copy of this note will be routed to requesting surgeon.  Time:   Today, I have spent 8 minutes with the patient with telehealth technology discussing medical history, symptoms, and management plan.  Napoleon Form, Leodis Rains, NP  04/16/2023, 8:08 AM

## 2023-04-21 DIAGNOSIS — M5416 Radiculopathy, lumbar region: Secondary | ICD-10-CM | POA: Diagnosis not present

## 2023-04-27 ENCOUNTER — Other Ambulatory Visit: Payer: Self-pay | Admitting: Family Medicine

## 2023-04-27 DIAGNOSIS — R600 Localized edema: Secondary | ICD-10-CM

## 2023-04-29 ENCOUNTER — Ambulatory Visit (INDEPENDENT_AMBULATORY_CARE_PROVIDER_SITE_OTHER): Payer: Medicare (Managed Care) | Admitting: Family Medicine

## 2023-04-29 VITALS — BP 136/62 | HR 76 | Temp 97.7°F | Ht 74.0 in | Wt 180.0 lb

## 2023-04-29 DIAGNOSIS — N401 Enlarged prostate with lower urinary tract symptoms: Secondary | ICD-10-CM

## 2023-04-29 DIAGNOSIS — R3914 Feeling of incomplete bladder emptying: Secondary | ICD-10-CM | POA: Diagnosis not present

## 2023-04-29 DIAGNOSIS — I10 Essential (primary) hypertension: Secondary | ICD-10-CM

## 2023-04-29 DIAGNOSIS — D649 Anemia, unspecified: Secondary | ICD-10-CM

## 2023-04-29 DIAGNOSIS — R300 Dysuria: Secondary | ICD-10-CM | POA: Diagnosis not present

## 2023-04-29 LAB — POC URINALSYSI DIPSTICK (AUTOMATED)
Bilirubin, UA: NEGATIVE
Blood, UA: NEGATIVE
Glucose, UA: NEGATIVE
Ketones, UA: NEGATIVE
Leukocytes, UA: NEGATIVE
Nitrite, UA: NEGATIVE
Protein, UA: NEGATIVE
Spec Grav, UA: 1.015 (ref 1.010–1.025)
Urobilinogen, UA: 0.2 U/dL
pH, UA: 6 (ref 5.0–8.0)

## 2023-04-29 NOTE — Progress Notes (Signed)
Established Patient Office Visit  Subjective   Patient ID: [name], male    DOB: 1932-05-19  Age: 89 y.o. MRN: [id number]  Chief Complaint  Patient presents with   Dysuria         HPI   [name] is seen today with 2-day history of reported mild burning with urination.  No fevers or chills.  No flank pain.  No nausea or vomiting.  He does have history of BPH and takes tamsulosin and finasteride.  No obstructive urinary symptoms.  No history of recent UTI.  No recent Foley catheter.  He has longstanding history of lumbar radiculitis.  He had recent epidural and that is helping his nerve pain tremendously.  Recently noted to have mild normocytic anemia.  B12 and iron studies were normal.  He remains on his usual medications including Eliquis.  His blood pressure remains controlled with metoprolol and amlodipine  Past Medical History:  Diagnosis Date   Actinic keratosis 03/13/2009   Arthritis    BENIGN PROSTATIC HYPERTROPHY, HX OF 04/07/2009   CARCINOMA, SKIN, SQUAMOUS CELL 09/11/2009   COLONIC POLYPS, HX OF 03/13/2009   Essential hypertension    GERD 03/13/2009   Hyperlipidemia    INSOMNIA, CHRONIC 09/11/2009   Persistent atrial fibrillation (HCC) 04/07/2009   a. s/p afib ablation at [facility] x 2 in 2010.   POLYPECTOMY, HX OF 04/07/2009   Premature atrial contractions    PULMONARY NODULE 09/11/2009   PVC's (premature ventricular contractions)    Rosacea 03/13/2009   SKIN CANCER, HX OF 03/13/2009   Past Surgical History:  Procedure Laterality Date   ABLATION  2010   x2   COLONOSCOPY W/ BIOPSIES AND POLYPECTOMY     EYE SURGERY Bilateral    cataracts   LUMBAR DISC SURGERY     RUPTURE   LUMBAR LAMINECTOMY/DECOMPRESSION MICRODISCECTOMY N/A 04/20/2014   Procedure: LUMBAR TWO-THREE, LUMBAR THREE-FOUR, LUMAR FOUR-FIVE LAMINECTOMIES;  Surgeon: Tressie Stalker, MD;  Location: [facility];  Service: Neurosurgery;  Laterality: N/A;   POLYPECTOMY     SKIN CANCER EXCISION       reports that he quit smoking about 35 years ago. His smoking use included cigarettes. He started smoking about 65 years ago. He has a 30 pack-year smoking history. His smokeless tobacco use includes chew. He reports that he does not drink alcohol and does not use drugs. family history includes CAD in his brother, brother, and sister; Cancer in his mother; Cancer (age of onset: 17) in his father; Heart attack in his father; Heart disease in his brother, brother, father, and sister. Allergies  Allergen Reactions   Amoxicillin-Pot Clavulanate Diarrhea and Nausea Only   Losartan Other (See Comments) and Nausea And Vomiting    Elevated creatinine levels Elevated creatinine levels    Review of Systems  Constitutional:  Negative for chills, fever and malaise/fatigue.  Eyes:  Negative for blurred vision.  Respiratory:  Negative for shortness of breath.   Cardiovascular:  Negative for chest pain.  Genitourinary:  Positive for dysuria. Negative for flank pain and hematuria.  Neurological:  Negative for dizziness, weakness and headaches.      Objective:     BP 136/62 (BP Location: Left Arm, Cuff Size: Normal)   Pulse 76   Temp 97.7 F (36.5 C) (Oral)   Ht 6\' 2"  (1.88 m)   Wt 180 lb (81.6 kg)   SpO2 99%   BMI 23.11 kg/m  BP Readings from Last 3 Encounters:  04/29/23 136/62  02/02/23 (!) 128/52  07/25/22 134/80   Wt Readings from Last 3 Encounters:  04/29/23 180 lb (81.6 kg)  02/02/23 186 lb 9.6 oz (84.6 kg)  07/25/22 194 lb (88 kg)      Physical Exam Vitals reviewed.  Constitutional:      Appearance: Normal appearance.  Cardiovascular:     Rate and Rhythm: Normal rate and regular rhythm.  Pulmonary:     Effort: Pulmonary effort is normal.     Breath sounds: Normal breath sounds.  Neurological:     Mental Status: He is alert.      Results for orders placed or performed in visit on 04/29/23  POCT Urinalysis Dipstick (Automated)  Result Value Ref Range   Color, UA  Yellow    Clarity, UA Cloudy    Glucose, UA Negative Negative   Bilirubin, UA Negative    Ketones, UA Negative    Spec Grav, UA 1.015 1.010 - 1.025   Blood, UA Negative    pH, UA 6.0 5.0 - 8.0   Protein, UA Negative Negative   Urobilinogen, UA 0.2 0.2 or 1.0 E.U./dL   Nitrite, UA Negative    Leukocytes, UA Negative Negative    Last CBC Lab Results  Component Value Date   WBC 6.3 03/05/2023   HGB 11.9 (L) 03/05/2023   HCT 37.4 (L) 03/05/2023   MCV 97.3 03/05/2023   MCH 32.6 02/21/2022   RDW 18.4 (H) 03/05/2023   PLT 213.0 03/05/2023   Last metabolic panel Lab Results  Component Value Date   GLUCOSE 98 02/03/2023   NA 140 02/03/2023   K 4.3 02/03/2023   CL 105 02/03/2023   CO2 27 02/03/2023   BUN 25 (H) 02/03/2023   CREATININE 1.33 02/03/2023   GFR 47.58 (L) 02/03/2023   CALCIUM 9.1 02/03/2023   PROT 6.2 02/03/2023   ALBUMIN 3.9 02/03/2023   LABGLOB 1.9 11/15/2020   AGRATIO 2.2 11/15/2020   BILITOT 0.4 02/03/2023   ALKPHOS 51 02/03/2023   AST 15 02/03/2023   ALT 6 02/03/2023   ANIONGAP 10 04/07/2019   Last lipids Lab Results  Component Value Date   CHOL 116 02/03/2023   HDL 30.60 (L) 02/03/2023   LDLCALC 53 02/03/2023   LDLDIRECT 91.0 09/19/2020   TRIG 165.0 (H) 02/03/2023   CHOLHDL 4 02/03/2023      The ASCVD Risk score (Arnett DK, et al., 2019) failed to calculate for the following reasons:   The 2019 ASCVD risk score is only valid for ages 26 to 70    Assessment & Plan:   #1 dysuria.  Urine dipstick today is completely clear.  No blood, leukocytes, or nitrites.  Etiology unclear.  Symptoms are relatively mild.  Recommend good hydration and observation.  Follow-up immediately for any fever or any persistent symptoms  #2 history of BPH stable on finasteride and tamsulosin.  No recent obstructive urinary symptoms.  #3 hypertension stable.  Initial blood pressure reading was up slightly but improved some after rest.  Continue metoprolol and  amlodipine  #4 normocytic anemia.  Reviewed recent labs.  Iron studies and B12 normal.  Consider repeat CBC in about 6 months  No follow-ups on file.    Evelena Peat, MD

## 2023-05-23 ENCOUNTER — Other Ambulatory Visit: Payer: Self-pay | Admitting: Cardiology

## 2023-05-23 DIAGNOSIS — I4891 Unspecified atrial fibrillation: Secondary | ICD-10-CM

## 2023-05-25 NOTE — Telephone Encounter (Signed)
Prescription refill request for Eliquis received. Indication:afib Last office visit:12/23 Scr:1.38  10/23 Age: 89 Weight:81.6  kg  Prescription refilled

## 2023-05-26 ENCOUNTER — Other Ambulatory Visit: Payer: Self-pay

## 2023-05-26 ENCOUNTER — Emergency Department (HOSPITAL_COMMUNITY): Payer: Medicare (Managed Care)

## 2023-05-26 ENCOUNTER — Observation Stay (HOSPITAL_COMMUNITY)
Admission: EM | Admit: 2023-05-26 | Discharge: 2023-05-27 | Disposition: A | Discharge status: Home / Self Care | Payer: Medicare (Managed Care) | Attending: Internal Medicine | Admitting: Internal Medicine

## 2023-05-26 DIAGNOSIS — G9340 Encephalopathy, unspecified: Secondary | ICD-10-CM | POA: Diagnosis not present

## 2023-05-26 DIAGNOSIS — A419 Sepsis, unspecified organism: Secondary | ICD-10-CM | POA: Diagnosis not present

## 2023-05-26 DIAGNOSIS — I10 Essential (primary) hypertension: Secondary | ICD-10-CM

## 2023-05-26 DIAGNOSIS — Z79899 Other long term (current) drug therapy: Secondary | ICD-10-CM | POA: Insufficient documentation

## 2023-05-26 DIAGNOSIS — R079 Chest pain, unspecified: Secondary | ICD-10-CM | POA: Diagnosis not present

## 2023-05-26 DIAGNOSIS — Z85828 Personal history of other malignant neoplasm of skin: Secondary | ICD-10-CM | POA: Insufficient documentation

## 2023-05-26 DIAGNOSIS — R918 Other nonspecific abnormal finding of lung field: Secondary | ICD-10-CM | POA: Diagnosis not present

## 2023-05-26 DIAGNOSIS — J189 Pneumonia, unspecified organism: Principal | ICD-10-CM

## 2023-05-26 DIAGNOSIS — R0789 Other chest pain: Secondary | ICD-10-CM | POA: Diagnosis not present

## 2023-05-26 DIAGNOSIS — M48062 Spinal stenosis, lumbar region with neurogenic claudication: Secondary | ICD-10-CM | POA: Diagnosis not present

## 2023-05-26 DIAGNOSIS — I129 Hypertensive chronic kidney disease with stage 1 through stage 4 chronic kidney disease, or unspecified chronic kidney disease: Secondary | ICD-10-CM | POA: Insufficient documentation

## 2023-05-26 DIAGNOSIS — N183 Chronic kidney disease, stage 3 unspecified: Secondary | ICD-10-CM | POA: Diagnosis not present

## 2023-05-26 DIAGNOSIS — I4891 Unspecified atrial fibrillation: Secondary | ICD-10-CM | POA: Insufficient documentation

## 2023-05-26 DIAGNOSIS — R509 Fever, unspecified: Secondary | ICD-10-CM

## 2023-05-26 DIAGNOSIS — Z87891 Personal history of nicotine dependence: Secondary | ICD-10-CM | POA: Insufficient documentation

## 2023-05-26 DIAGNOSIS — J929 Pleural plaque without asbestos: Secondary | ICD-10-CM | POA: Diagnosis not present

## 2023-05-26 DIAGNOSIS — M5416 Radiculopathy, lumbar region: Secondary | ICD-10-CM | POA: Diagnosis not present

## 2023-05-26 DIAGNOSIS — Z7901 Long term (current) use of anticoagulants: Secondary | ICD-10-CM | POA: Insufficient documentation

## 2023-05-26 DIAGNOSIS — I480 Paroxysmal atrial fibrillation: Secondary | ICD-10-CM

## 2023-05-26 DIAGNOSIS — I959 Hypotension, unspecified: Secondary | ICD-10-CM | POA: Diagnosis not present

## 2023-05-26 LAB — URINALYSIS, ROUTINE W REFLEX MICROSCOPIC
Bilirubin Urine: NEGATIVE
Glucose, UA: NEGATIVE mg/dL
Hgb urine dipstick: NEGATIVE
Ketones, ur: NEGATIVE mg/dL
Leukocytes,Ua: NEGATIVE
Nitrite: NEGATIVE
Protein, ur: NEGATIVE mg/dL
Specific Gravity, Urine: 1.017 (ref 1.005–1.030)
pH: 6 (ref 5.0–8.0)

## 2023-05-26 LAB — CBC WITH DIFFERENTIAL/PLATELET
Abs Immature Granulocytes: 0.22 10*3/uL — ABNORMAL HIGH (ref 0.00–0.07)
Basophils Absolute: 0 10*3/uL (ref 0.0–0.1)
Basophils Relative: 0 %
Eosinophils Absolute: 0 10*3/uL (ref 0.0–0.5)
Eosinophils Relative: 0 %
HCT: 38 % — ABNORMAL LOW (ref 39.0–52.0)
Hemoglobin: 12.1 g/dL — ABNORMAL LOW (ref 13.0–17.0)
Immature Granulocytes: 1 %
Lymphocytes Relative: 6 %
Lymphs Abs: 1 10*3/uL (ref 0.7–4.0)
MCH: 31.7 pg (ref 26.0–34.0)
MCHC: 31.8 g/dL (ref 30.0–36.0)
MCV: 99.5 fL (ref 80.0–100.0)
Monocytes Absolute: 1.6 10*3/uL — ABNORMAL HIGH (ref 0.1–1.0)
Monocytes Relative: 10 %
Neutro Abs: 13.5 10*3/uL — ABNORMAL HIGH (ref 1.7–7.7)
Neutrophils Relative %: 83 %
Platelets: 191 10*3/uL (ref 150–400)
RBC: 3.82 MIL/uL — ABNORMAL LOW (ref 4.22–5.81)
RDW: 17.9 % — ABNORMAL HIGH (ref 11.5–15.5)
WBC: 16.3 10*3/uL — ABNORMAL HIGH (ref 4.0–10.5)
nRBC: 0 % (ref 0.0–0.2)

## 2023-05-26 LAB — TROPONIN I (HIGH SENSITIVITY)
Troponin I (High Sensitivity): 10 ng/L (ref <18)
Troponin I (High Sensitivity): 8 ng/L (ref <18)

## 2023-05-26 LAB — I-STAT CG4 LACTIC ACID, ED: Lactic Acid, Venous: 2.8 mmol/L (ref 0.5–1.9)

## 2023-05-26 MED ORDER — LACTATED RINGERS IV BOLUS
1000.0000 mL | Freq: Once | INTRAVENOUS | Status: AC
  Administered 2023-05-26: 1000 mL via INTRAVENOUS

## 2023-05-26 MED ORDER — SODIUM CHLORIDE 0.9 % IV SOLN
1.0000 g | Freq: Once | INTRAVENOUS | Status: AC
  Administered 2023-05-27: 1 g via INTRAVENOUS
  Filled 2023-05-26: qty 10

## 2023-05-26 MED ORDER — SODIUM CHLORIDE 0.9 % IV SOLN
500.0000 mg | Freq: Once | INTRAVENOUS | Status: AC
  Administered 2023-05-26: 500 mg via INTRAVENOUS
  Filled 2023-05-26: qty 5

## 2023-05-26 MED ORDER — SODIUM CHLORIDE 0.9 % IV SOLN: 500.0000 mg | INTRAVENOUS | Status: DC

## 2023-05-26 MED ORDER — SODIUM CHLORIDE 0.9 % IV SOLN: 2.0000 g | INTRAVENOUS | Status: DC

## 2023-05-26 MED ORDER — SODIUM CHLORIDE 0.9 % IV SOLN
1.0000 g | Freq: Once | INTRAVENOUS | Status: AC
  Administered 2023-05-26: 1 g via INTRAVENOUS
  Filled 2023-05-26: qty 10

## 2023-05-26 NOTE — Assessment & Plan Note (Signed)
Hold home amlodipine and lasix in setting of sepsis. Holding home metoprolol for the moment till we see how he does overnight.

## 2023-05-26 NOTE — Discharge Instructions (Addendum)
Please take your antibiotic until complete.  Follow up with your primary doctor within 1-2 weeks to monitor your recovery

## 2023-05-26 NOTE — Assessment & Plan Note (Signed)
Delirium secondary to sepsis + PNA.

## 2023-05-26 NOTE — ED Triage Notes (Signed)
Patient arrives via [facility] from home where he lives with his wife. She reported to EMS that patient had a doctors appointment with his pain management clinic today around 2 pm and after this started to tremor and develop chest wall pain. Per EMS this has happened before however they state he is more lethargic than normal and and Aox2-3 which is not normal for him.  Patient did have a temp with EMS 100.4. BP 136/58 HR 84 Spo2-95%

## 2023-05-26 NOTE — H&P (Signed)
History and Physical    Patient: [name]:[id number] DOB: 1932-06-29 DOA: 05/26/2023 DOS: the patient was seen and examined on 05/26/2023 PCP: Kristian Covey, MD  Patient coming from: Home  Chief Complaint:  Chief Complaint  Patient presents with   Altered Mental Status   Tremors   Chest Pain   HPI: [name] is a 89 y.o. male with medical history significant of A.Fib on eliquis, HTN.  Pt was at pain med doc office today.  Noted to be more confused than baseline.  Also chest wall pain.  Family brought him back home.  Developed frank rigors and AMS with confusion and lethargy.  Pt in to ED.  Febrile to 100.4 with EMS.  Review of Systems: As mentioned in the history of present illness. All other systems reviewed and are negative. Past Medical History:  Diagnosis Date   Actinic keratosis 03/13/2009   Arthritis    BENIGN PROSTATIC HYPERTROPHY, HX OF 04/07/2009   CARCINOMA, SKIN, SQUAMOUS CELL 09/11/2009   COLONIC POLYPS, HX OF 03/13/2009   Essential hypertension    GERD 03/13/2009   Hyperlipidemia    INSOMNIA, CHRONIC 09/11/2009   Persistent atrial fibrillation (HCC) 04/07/2009   a. s/p afib ablation at [facility] x 2 in 2010.   POLYPECTOMY, HX OF 04/07/2009   Premature atrial contractions    PULMONARY NODULE 09/11/2009   PVC's (premature ventricular contractions)    Rosacea 03/13/2009   SKIN CANCER, HX OF 03/13/2009   Past Surgical History:  Procedure Laterality Date   ABLATION  2010   x2   COLONOSCOPY W/ BIOPSIES AND POLYPECTOMY     EYE SURGERY Bilateral    cataracts   LUMBAR DISC SURGERY     RUPTURE   LUMBAR LAMINECTOMY/DECOMPRESSION MICRODISCECTOMY N/A 04/20/2014   Procedure: LUMBAR TWO-THREE, LUMBAR THREE-FOUR, LUMAR FOUR-FIVE LAMINECTOMIES;  Surgeon: Tressie Stalker, MD;  Location: [facility];  Service: Neurosurgery;  Laterality: N/A;   POLYPECTOMY     SKIN CANCER EXCISION     Social History:  reports that he quit smoking about 35 years ago. His  smoking use included cigarettes. He started smoking about 65 years ago. He has a 30 pack-year smoking history. His smokeless tobacco use includes chew. He reports that he does not drink alcohol and does not use drugs.  Allergies  Allergen Reactions   Amoxicillin-Pot Clavulanate Diarrhea and Nausea Only   Losartan Other (See Comments) and Nausea And Vomiting    Elevated creatinine levels Elevated creatinine levels    Family History  Problem Relation Age of Onset   Cancer Mother    Cancer Father 85       COLON   Heart attack Father    Heart disease Father    Heart disease Sister    CAD Sister    Heart disease Brother    CAD Brother    CAD Brother    Heart disease Brother     Prior to Admission medications   Medication Sig Start Date End Date Taking? Authorizing Provider  acetaminophen (TYLENOL) 500 MG tablet Take 500 mg by mouth every 6 (six) hours as needed for mild pain.   Yes [provider]  amLODipine (NORVASC) 2.5 MG tablet TAKE 1 TABLET EVERY DAY 03/10/23  Yes Burchette, Elberta Fortis, MD  budesonide-formoterol (BREYNA) 80-4.5 MCG/ACT inhaler Inhale 2 puffs into the lungs in the morning and at bedtime. 03/20/23  Yes Burchette, Elberta Fortis, MD  ELIQUIS 5 MG TABS tablet TAKE 1 TABLET BY MOUTH TWICE  A DAY 05/25/23  Yes Turner, Traci R, MD  finasteride (PROSCAR) 5 MG tablet TAKE 1 TABLET (5 MG TOTAL) BY MOUTH DAILY. 03/12/23  Yes Burchette, Elberta Fortis, MD  furosemide (LASIX) 40 MG tablet TAKE 1 TABLET EVERY DAY AS NEEDED FOR LEG SWELLING 04/29/23  Yes Burchette, Elberta Fortis, MD  glucosamine-chondroitin 500-400 MG tablet Take 2 tablets by mouth daily.   Yes [provider]  lansoprazole (PREVACID) 30 MG capsule TAKE 1 CAPSULE EVERY DAY AT 12 NOON. 04/01/23  Yes Turner, Traci R, MD  lovastatin (MEVACOR) 20 MG tablet TAKE 1 TABLET BY MOUTH EVERYDAY AT BEDTIME 04/29/23  Yes Burchette, Elberta Fortis, MD  metoprolol succinate (TOPROL-XL) 50 MG 24 hr tablet TAKE 1 TABLET BY MOUTH DAILY. TAKE WITH OR  IMMEDIATELY FOLLOWING A MEAL. 03/09/23  Yes Turner, Cornelious Bryant, MD  potassium chloride (KLOR-CON) 10 MEQ tablet TAKE 2 TABLETS BY MOUTH EVERY DAY 10/06/22  Yes Burchette, Elberta Fortis, MD  tamsulosin (FLOMAX) 0.4 MG CAPS capsule Take 1 capsule (0.4 mg total) by mouth daily. 09/06/21  Yes Burchette, Elberta Fortis, MD  vitamin B-12 (CYANOCOBALAMIN) 500 MCG tablet Take 500 mcg by mouth daily.   Yes [provider]  VITAMIN D PO Take 400 Units by mouth daily.    Yes [provider]  metroNIDAZOLE (METROCREAM) 0.75 % cream Apply topically daily. 12/25/21   Kristian Covey, MD    Physical Exam: Vitals:   05/26/23 2140 05/26/23 2145 05/26/23 2215 05/26/23 2300  BP:  (!) 148/49 (!) 139/52 (!) 154/48  Pulse:  80 80 80  Resp:  (!) 29 16 (!) 31  Temp:      TempSrc:      SpO2:  95% 96% 95%  Weight: 82.6 kg     Height: 6\' 2"  (1.88 m)      Constitutional: NAD, calm, comfortable Respiratory: clear to auscultation bilaterally, no wheezing, no crackles. Normal respiratory effort. No accessory muscle use.  Cardiovascular: Regular rate and rhythm, no murmurs / rubs / gallops. No extremity edema. 2+ pedal pulses. No carotid bruits.  Abdomen: no tenderness, no masses palpated. No hepatosplenomegaly. Bowel sounds positive.  Neurologic: CN 2-12 grossly intact. Sensation intact, DTR normal. Strength 5/5 in all 4.  Psychiatric: Not really confused at time of my exam.  Data Reviewed:  Results are pending, will review when available. Labs on Admission: I have personally reviewed following labs and imaging studies  CBC: Recent Labs  Lab 05/26/23 2143  WBC 16.3*  NEUTROABS 13.5*  HGB 12.1*  HCT 38.0*  MCV 99.5  PLT 191   Basic Metabolic Panel: Recent Labs  Lab 05/26/23 2143  NA PENDING  K PENDING  CL PENDING  CO2 PENDING  GLUCOSE PENDING  BUN PENDING  CREATININE PENDING  CALCIUM PENDING   GFR: CrCl cannot be calculated (This lab value cannot be used to calculate CrCl because it is not a  number: PENDING). Liver Function Tests: Recent Labs  Lab 05/26/23 2143  AST PENDING  ALT PENDING  ALKPHOS PENDING  BILITOT PENDING  PROT PENDING  ALBUMIN PENDING   No results for input(s): "LIPASE", "AMYLASE" in the last 168 hours. No results for input(s): "AMMONIA" in the last 168 hours. Coagulation Profile: No results for input(s): "INR", "PROTIME" in the last 168 hours. Cardiac Enzymes: No results for input(s): "CKTOTAL", "CKMB", "CKMBINDEX", "TROPONINI" in the last 168 hours. BNP (last 3 results) No results for input(s): "PROBNP" in the last 8760 hours. HbA1C: No results for input(s): "HGBA1C" in the  last 72 hours. CBG: No results for input(s): "GLUCAP" in the last 168 hours. Lipid Profile: No results for input(s): "CHOL", "HDL", "LDLCALC", "TRIG", "CHOLHDL", "LDLDIRECT" in the last 72 hours. Thyroid Function Tests: No results for input(s): "TSH", "T4TOTAL", "FREET4", "T3FREE", "THYROIDAB" in the last 72 hours. Anemia Panel: No results for input(s): "VITAMINB12", "FOLATE", "FERRITIN", "TIBC", "IRON", "RETICCTPCT" in the last 72 hours. Urine analysis:    Component Value Date/Time   COLORURINE YELLOW 05/26/2023 2121   APPEARANCEUR CLEAR 05/26/2023 2121   LABSPEC 1.017 05/26/2023 2121   PHURINE 6.0 05/26/2023 2121   GLUCOSEU NEGATIVE 05/26/2023 2121   HGBUR NEGATIVE 05/26/2023 2121   BILIRUBINUR NEGATIVE 05/26/2023 2121   BILIRUBINUR Negative 04/29/2023 1342   KETONESUR NEGATIVE 05/26/2023 2121   PROTEINUR NEGATIVE 05/26/2023 2121   UROBILINOGEN 0.2 04/29/2023 1342   UROBILINOGEN 1 02/23/2014 1404   NITRITE NEGATIVE 05/26/2023 2121   LEUKOCYTESUR NEGATIVE 05/26/2023 2121    Radiological Exams on Admission: DG Chest 2 View  Result Date: 05/26/2023 CLINICAL DATA:  Chest pain. EXAM: CHEST - 2 VIEW COMPARISON:  Chest radiograph dated 01/04/2020 FINDINGS: . faint area of increased density in the lingula may represent atelectasis or infiltrate. The right lung is clear.  No pleural effusion or pneumothorax. Calcified pleural plaque of the left lung base. The cardiac silhouette is within normal limits. No acute osseous pathology. IMPRESSION: Lingular atelectasis or infiltrate. Electronically Signed   By: Elgie Collard M.D.   On: 05/26/2023 22:26    EKG: Independently reviewed.   Assessment and Plan: * Sepsis due to pneumonia (HCC) Left lingular PNA with fever, rigors, tachypnea and AMS. PNA pathway Initial lactate 2.8 Rocephin + azithro Tele monitor 2L IVF bolus in ED, will hold off on additional fluids for the moment pending repeat lactate and CMP results since his BP now 150s systolic. Hold home lasix  Acute encephalopathy Delirium secondary to sepsis + PNA.  CKD (chronic kidney disease) stage 3, GFR 30-59 ml/min (HCC) Creat runs 1.3 at baseline it looks like. Creat today still pending.  ATRIAL FIBRILLATION Continue eliquis Tele monitor Holding orders for metoprolol for the moment till we see how he does over night, though if BP remains elevated, restart this in AM (when he's due for his next dose anyhow as he takes this med once a day.).  Essential hypertension Hold home amlodipine and lasix in setting of sepsis. Holding home metoprolol for the moment till we see how he does overnight.      Advance Care Planning:   Code Status: Full Code Confirmed with wife.  Consults: None  Family Communication: Wife at bedside  Severity of Illness: The appropriate patient status for this patient is INPATIENT. Inpatient status is judged to be reasonable and necessary in order to provide the required intensity of service to ensure the patient's safety. The patient's presenting symptoms, physical exam findings, and initial radiographic and laboratory data in the context of their chronic comorbidities is felt to place them at high risk for further clinical deterioration. Furthermore, it is not anticipated that the patient will be medically stable for  discharge from the hospital within 2 midnights of admission.   * I certify that at the point of admission it is my clinical judgment that the patient will require inpatient hospital care spanning beyond 2 midnights from the point of admission due to high intensity of service, high risk for further deterioration and high frequency of surveillance required.*  Author: Hillary Bow., DO 05/26/2023 11:33 PM  For on  call review [email or web address].

## 2023-05-26 NOTE — Assessment & Plan Note (Addendum)
Left lingular PNA with fever, rigors, tachypnea and AMS. PNA pathway Initial lactate 2.8 Rocephin + azithro Tele monitor 2L IVF bolus in ED, will hold off on additional fluids for the moment pending repeat lactate and CMP results since his BP now 150s systolic. Hold home lasix

## 2023-05-26 NOTE — Progress Notes (Signed)
Elink monitoring for the code sepsis protocol.  

## 2023-05-26 NOTE — ED Provider Notes (Signed)
[facility] AT [facility] Provider Note   CSN: [id number] Arrival date & time: 05/26/23  2035     History Chief Complaint  Patient presents with   Altered Mental Status   Tremors   Chest Pain    HPI [name] is a 89 y.o. male presenting for multiple plaints.  Coming on with gross altered mental status and confusion.  He has been agitated throughout the day today.  They also endorse a fever of 100.4, shortness of breath though no cough or other symptoms. Temperature of 100.4 on arrival with EMS.  Brought in by EMS for altered mental status..   Patient's recorded medical, surgical, social, medication list and allergies were reviewed in the Snapshot window as part of the initial history.   Review of Systems   Review of Systems  Constitutional:  Positive for fatigue and fever. Negative for chills.  HENT:  Negative for ear pain and sore throat.   Eyes:  Negative for pain and visual disturbance.  Respiratory:  Positive for shortness of breath. Negative for cough.   Cardiovascular:  Negative for chest pain and palpitations.  Gastrointestinal:  Negative for abdominal pain and vomiting.  Genitourinary:  Negative for dysuria and hematuria.  Musculoskeletal:  Negative for arthralgias and back pain.  Skin:  Negative for color change and rash.  Neurological:  Negative for seizures and syncope.  All other systems reviewed and are negative.   Physical Exam Updated Vital Signs BP (!) 139/52   Pulse 80   Temp 99.5 F (37.5 C) (Oral)   Resp 16   Ht 6\' 2"  (1.88 m)   Wt 82.6 kg   SpO2 96%   BMI 23.37 kg/m  Physical Exam Vitals and nursing note reviewed.  Constitutional:      General: He is not in acute distress.    Appearance: He is well-developed. He is ill-appearing.  HENT:     Head: Normocephalic and atraumatic.  Eyes:     Conjunctiva/sclera: Conjunctivae normal.  Cardiovascular:     Rate and Rhythm: Normal rate and regular rhythm.      Heart sounds: No murmur heard. Pulmonary:     Effort: Pulmonary effort is normal. Tachypnea present. No respiratory distress.     Breath sounds: Normal breath sounds.  Abdominal:     Palpations: Abdomen is soft.     Tenderness: There is no abdominal tenderness.  Musculoskeletal:        General: No swelling.     Cervical back: Neck supple.  Skin:    General: Skin is warm and dry.     Capillary Refill: Capillary refill takes less than 2 seconds.  Neurological:     Mental Status: He is alert. He is disoriented.  Psychiatric:        Mood and Affect: Mood normal.      ED Course/ Medical Decision Making/ A&P    Procedures Procedures   Medications Ordered in ED Medications  azithromycin (ZITHROMAX) 500 mg in sodium chloride 0.9 % 250 mL IVPB (has no administration in time range)  cefTRIAXone (ROCEPHIN) 1 g in sodium chloride 0.9 % 100 mL IVPB (1 g Intravenous New Bag/Given 05/26/23 2244)  lactated ringers bolus 1,000 mL (has no administration in time range)  lactated ringers bolus 1,000 mL (1,000 mLs Intravenous New Bag/Given 05/26/23 3460)   89 year old male sent from home with a chief complaint of altered mental status. Extensive medical history including A-fib status post ablation. Per his family he  was grossly disoriented all throughout the day and was having shaking of his muscles. He had an appointment at his pain clinic today but was unable to participate in the evaluation because of his alteration in mental status. EMS was called out due to the degree of confusion. Patient's history of present illness and physical exam findings are most consistent with likely infectious pathology.  Given his dyspnea and chest pain, pulmonary infection is considered grossly consistent with his presentation.  Urinary infection is also considered possible though less likely with this presentation as he has no findings at that site. ACS on the differential but again less likely with a normal EKG,  atypical chest pain going on over 48 hours.  Reassessment: On reassessment patient has a gross leukocytosis at 17, x-ray with focal infiltrate in his left basilar lobe where his pain is localized to. He is tachypneic to the 30s and has a positive lactic acid all of which is consistent with acute pulmonary infection.  Started on ceftriaxone azithromycin with blood cultures and code sepsis activated. IV fluids initiated and patient arranged for admission for further care and management.  Disposition:   Based on the above findings, I believe this patient is stable for admission.    Patient/family educated about specific findings on our evaluation and explained exact reasons for admission.  Patient/family educated about clinical situation and time was allowed to answer questions.   Admission team communicated with and agreed with need for admission. Patient admitted. Patient ready to move at this time.     Emergency Department Medication Summary:   Medications  azithromycin (ZITHROMAX) 500 mg in sodium chloride 0.9 % 250 mL IVPB (has no administration in time range)  cefTRIAXone (ROCEPHIN) 1 g in sodium chloride 0.9 % 100 mL IVPB (1 g Intravenous New Bag/Given 05/26/23 2244)  lactated ringers bolus 1,000 mL (has no administration in time range)  lactated ringers bolus 1,000 mL (1,000 mLs Intravenous New Bag/Given 05/26/23 2247)        Clinical Impression: No diagnosis found.   Data Unavailable   Final Clinical Impression(s) / ED Diagnoses Final diagnoses:  None    Rx / DC Orders ED Discharge Orders     None         Glyn Ade, MD 05/26/23 2255

## 2023-05-26 NOTE — Assessment & Plan Note (Signed)
Creat runs 1.3 at baseline it looks like. Creat today still pending.

## 2023-05-26 NOTE — Assessment & Plan Note (Addendum)
Continue eliquis Tele monitor Holding orders for metoprolol for the moment till we see how he does over night, though if BP remains elevated, restart this in AM (when he's due for his next dose anyhow as he takes this med once a day.).

## 2023-05-27 ENCOUNTER — Encounter (HOSPITAL_COMMUNITY): Payer: Self-pay | Admitting: Internal Medicine

## 2023-05-27 ENCOUNTER — Encounter (HOSPITAL_COMMUNITY): Payer: Self-pay

## 2023-05-27 ENCOUNTER — Encounter: Payer: Self-pay | Admitting: Internal Medicine

## 2023-05-27 ENCOUNTER — Inpatient Hospital Stay (HOSPITAL_COMMUNITY)
Admission: EM | Admit: 2023-05-27 | Discharge: 2023-06-02 | DRG: 871 | Disposition: A | Discharge status: Home Health | Payer: Medicare (Managed Care) | Attending: Student | Admitting: Student

## 2023-05-27 ENCOUNTER — Other Ambulatory Visit: Payer: Self-pay

## 2023-05-27 DIAGNOSIS — Z888 Allergy status to other drugs, medicaments and biological substances status: Secondary | ICD-10-CM

## 2023-05-27 DIAGNOSIS — F05 Delirium due to known physiological condition: Secondary | ICD-10-CM | POA: Diagnosis present

## 2023-05-27 DIAGNOSIS — I4819 Other persistent atrial fibrillation: Secondary | ICD-10-CM | POA: Diagnosis present

## 2023-05-27 DIAGNOSIS — G8929 Other chronic pain: Secondary | ICD-10-CM | POA: Diagnosis present

## 2023-05-27 DIAGNOSIS — A4101 Sepsis due to Methicillin susceptible Staphylococcus aureus: Secondary | ICD-10-CM | POA: Diagnosis not present

## 2023-05-27 DIAGNOSIS — Z8249 Family history of ischemic heart disease and other diseases of the circulatory system: Secondary | ICD-10-CM

## 2023-05-27 DIAGNOSIS — R600 Localized edema: Secondary | ICD-10-CM | POA: Diagnosis not present

## 2023-05-27 DIAGNOSIS — K219 Gastro-esophageal reflux disease without esophagitis: Secondary | ICD-10-CM | POA: Diagnosis present

## 2023-05-27 DIAGNOSIS — Z23 Encounter for immunization: Secondary | ICD-10-CM

## 2023-05-27 DIAGNOSIS — F17220 Nicotine dependence, chewing tobacco, uncomplicated: Secondary | ICD-10-CM | POA: Diagnosis not present

## 2023-05-27 DIAGNOSIS — R509 Fever, unspecified: Secondary | ICD-10-CM

## 2023-05-27 DIAGNOSIS — N1832 Chronic kidney disease, stage 3b: Secondary | ICD-10-CM | POA: Diagnosis present

## 2023-05-27 DIAGNOSIS — J929 Pleural plaque without asbestos: Secondary | ICD-10-CM | POA: Diagnosis not present

## 2023-05-27 DIAGNOSIS — Z452 Encounter for adjustment and management of vascular access device: Secondary | ICD-10-CM | POA: Diagnosis not present

## 2023-05-27 DIAGNOSIS — R079 Chest pain, unspecified: Secondary | ICD-10-CM | POA: Diagnosis not present

## 2023-05-27 DIAGNOSIS — Z79899 Other long term (current) drug therapy: Secondary | ICD-10-CM | POA: Diagnosis not present

## 2023-05-27 DIAGNOSIS — K5900 Constipation, unspecified: Secondary | ICD-10-CM | POA: Diagnosis not present

## 2023-05-27 DIAGNOSIS — I129 Hypertensive chronic kidney disease with stage 1 through stage 4 chronic kidney disease, or unspecified chronic kidney disease: Secondary | ICD-10-CM | POA: Diagnosis present

## 2023-05-27 DIAGNOSIS — Z860100 Personal history of colon polyps, unspecified: Secondary | ICD-10-CM

## 2023-05-27 DIAGNOSIS — Z556 Problems related to health literacy: Secondary | ICD-10-CM | POA: Diagnosis not present

## 2023-05-27 DIAGNOSIS — R7881 Bacteremia: Secondary | ICD-10-CM | POA: Diagnosis not present

## 2023-05-27 DIAGNOSIS — R739 Hyperglycemia, unspecified: Secondary | ICD-10-CM | POA: Diagnosis present

## 2023-05-27 DIAGNOSIS — R911 Solitary pulmonary nodule: Secondary | ICD-10-CM | POA: Diagnosis not present

## 2023-05-27 DIAGNOSIS — R918 Other nonspecific abnormal finding of lung field: Secondary | ICD-10-CM | POA: Diagnosis not present

## 2023-05-27 DIAGNOSIS — J189 Pneumonia, unspecified organism: Secondary | ICD-10-CM | POA: Diagnosis present

## 2023-05-27 DIAGNOSIS — E785 Hyperlipidemia, unspecified: Secondary | ICD-10-CM | POA: Diagnosis present

## 2023-05-27 DIAGNOSIS — L03119 Cellulitis of unspecified part of limb: Secondary | ICD-10-CM

## 2023-05-27 DIAGNOSIS — R6889 Other general symptoms and signs: Secondary | ICD-10-CM | POA: Diagnosis present

## 2023-05-27 DIAGNOSIS — R233 Spontaneous ecchymoses: Secondary | ICD-10-CM | POA: Diagnosis present

## 2023-05-27 DIAGNOSIS — Z7901 Long term (current) use of anticoagulants: Secondary | ICD-10-CM

## 2023-05-27 DIAGNOSIS — B9561 Methicillin susceptible Staphylococcus aureus infection as the cause of diseases classified elsewhere: Secondary | ICD-10-CM | POA: Diagnosis not present

## 2023-05-27 DIAGNOSIS — L03116 Cellulitis of left lower limb: Secondary | ICD-10-CM | POA: Diagnosis present

## 2023-05-27 DIAGNOSIS — Z880 Allergy status to penicillin: Secondary | ICD-10-CM | POA: Diagnosis not present

## 2023-05-27 DIAGNOSIS — Z85828 Personal history of other malignant neoplasm of skin: Secondary | ICD-10-CM | POA: Diagnosis not present

## 2023-05-27 DIAGNOSIS — R3911 Hesitancy of micturition: Secondary | ICD-10-CM | POA: Diagnosis not present

## 2023-05-27 DIAGNOSIS — R0789 Other chest pain: Secondary | ICD-10-CM | POA: Diagnosis not present

## 2023-05-27 DIAGNOSIS — I340 Nonrheumatic mitral (valve) insufficiency: Secondary | ICD-10-CM | POA: Diagnosis not present

## 2023-05-27 DIAGNOSIS — L0390 Cellulitis, unspecified: Secondary | ICD-10-CM | POA: Diagnosis not present

## 2023-05-27 DIAGNOSIS — N401 Enlarged prostate with lower urinary tract symptoms: Secondary | ICD-10-CM | POA: Diagnosis not present

## 2023-05-27 DIAGNOSIS — Z809 Family history of malignant neoplasm, unspecified: Secondary | ICD-10-CM

## 2023-05-27 DIAGNOSIS — Z792 Long term (current) use of antibiotics: Secondary | ICD-10-CM | POA: Diagnosis not present

## 2023-05-27 DIAGNOSIS — Z87891 Personal history of nicotine dependence: Secondary | ICD-10-CM | POA: Diagnosis not present

## 2023-05-27 DIAGNOSIS — Z9181 History of falling: Secondary | ICD-10-CM | POA: Diagnosis not present

## 2023-05-27 DIAGNOSIS — Z800 Family history of malignant neoplasm of digestive organs: Secondary | ICD-10-CM | POA: Diagnosis not present

## 2023-05-27 LAB — COMPREHENSIVE METABOLIC PANEL
ALT: 13 U/L (ref 0–44)
AST: 19 U/L (ref 15–41)
Albumin: 3.9 g/dL (ref 3.5–5.0)
Alkaline Phosphatase: 53 U/L (ref 38–126)
Anion gap: 14 (ref 5–15)
BUN: 27 mg/dL — ABNORMAL HIGH (ref 8–23)
CO2: 24 mmol/L (ref 22–32)
Calcium: 9.1 mg/dL (ref 8.9–10.3)
Chloride: 99 mmol/L (ref 98–111)
Creatinine, Ser: 1.62 mg/dL — ABNORMAL HIGH (ref 0.61–1.24)
GFR, Estimated: 40 mL/min — ABNORMAL LOW (ref >60)
Glucose, Bld: 168 mg/dL — ABNORMAL HIGH (ref 70–99)
Potassium: 4.7 mmol/L (ref 3.5–5.1)
Sodium: 137 mmol/L (ref 135–145)
Total Bilirubin: 1.1 mg/dL (ref 0.3–1.2)
Total Protein: 6.1 g/dL — ABNORMAL LOW (ref 6.5–8.1)

## 2023-05-27 LAB — BLOOD CULTURE ID PANEL (REFLEXED) - BCID2

## 2023-05-27 LAB — CBC WITH DIFFERENTIAL/PLATELET
Abs Immature Granulocytes: 0.13 10*3/uL — ABNORMAL HIGH (ref 0.00–0.07)
Basophils Absolute: 0 10*3/uL (ref 0.0–0.1)
Basophils Relative: 0 %
Eosinophils Absolute: 0 10*3/uL (ref 0.0–0.5)
Eosinophils Relative: 0 %
HCT: 34 % — ABNORMAL LOW (ref 39.0–52.0)
Hemoglobin: 10.9 g/dL — ABNORMAL LOW (ref 13.0–17.0)
Immature Granulocytes: 2 %
Lymphocytes Relative: 15 %
Lymphs Abs: 1.1 10*3/uL (ref 0.7–4.0)
MCH: 32.1 pg (ref 26.0–34.0)
MCHC: 32.1 g/dL (ref 30.0–36.0)
MCV: 100 fL (ref 80.0–100.0)
Monocytes Absolute: 1 10*3/uL (ref 0.1–1.0)
Monocytes Relative: 14 %
Neutro Abs: 5.1 10*3/uL (ref 1.7–7.7)
Neutrophils Relative %: 69 %
Platelets: 167 10*3/uL (ref 150–400)
RBC: 3.4 MIL/uL — ABNORMAL LOW (ref 4.22–5.81)
RDW: 17.9 % — ABNORMAL HIGH (ref 11.5–15.5)
WBC: 7.3 10*3/uL (ref 4.0–10.5)
nRBC: 0 % (ref 0.0–0.2)

## 2023-05-27 LAB — BASIC METABOLIC PANEL
Anion gap: 10 (ref 5–15)
Anion gap: 14 (ref 5–15)
BUN: 23 mg/dL (ref 8–23)
BUN: 28 mg/dL — ABNORMAL HIGH (ref 8–23)
CO2: 23 mmol/L (ref 22–32)
CO2: 23 mmol/L (ref 22–32)
Calcium: 8.4 mg/dL — ABNORMAL LOW (ref 8.9–10.3)
Calcium: 8.8 mg/dL — ABNORMAL LOW (ref 8.9–10.3)
Chloride: 104 mmol/L (ref 98–111)
Chloride: 101 mmol/L (ref 98–111)
Creatinine, Ser: 1.43 mg/dL — ABNORMAL HIGH (ref 0.61–1.24)
Creatinine, Ser: 1.43 mg/dL — ABNORMAL HIGH (ref 0.61–1.24)
GFR, Estimated: 47 mL/min — ABNORMAL LOW (ref >60)
GFR, Estimated: 47 mL/min — ABNORMAL LOW (ref >60)
Glucose, Bld: 141 mg/dL — ABNORMAL HIGH (ref 70–99)
Glucose, Bld: 144 mg/dL — ABNORMAL HIGH (ref 70–99)
Potassium: 4.2 mmol/L (ref 3.5–5.1)
Potassium: 4.1 mmol/L (ref 3.5–5.1)
Sodium: 137 mmol/L (ref 135–145)
Sodium: 138 mmol/L (ref 135–145)

## 2023-05-27 LAB — CBC
HCT: 33.4 % — ABNORMAL LOW (ref 39.0–52.0)
Hemoglobin: 10.5 g/dL — ABNORMAL LOW (ref 13.0–17.0)
MCH: 31.5 pg (ref 26.0–34.0)
MCHC: 31.4 g/dL (ref 30.0–36.0)
MCV: 100.3 fL — ABNORMAL HIGH (ref 80.0–100.0)
Platelets: 160 10*3/uL (ref 150–400)
RBC: 3.33 MIL/uL — ABNORMAL LOW (ref 4.22–5.81)
RDW: 17.6 % — ABNORMAL HIGH (ref 11.5–15.5)
WBC: 11.8 10*3/uL — ABNORMAL HIGH (ref 4.0–10.5)
nRBC: 0 % (ref 0.0–0.2)

## 2023-05-27 LAB — I-STAT CG4 LACTIC ACID, ED
Lactic Acid, Venous: 2.3 mmol/L (ref 0.5–1.9)
Lactic Acid, Venous: 0.9 mmol/L (ref 0.5–1.9)

## 2023-05-27 LAB — HIV ANTIBODY (ROUTINE TESTING W REFLEX): HIV Screen 4th Generation wRfx: NONREACTIVE

## 2023-05-27 MED ORDER — FINASTERIDE 5 MG PO TABS
5.0000 mg | ORAL_TABLET | Freq: Every day | ORAL | Status: DC
  Administered 2023-05-27: 5 mg via ORAL
  Filled 2023-05-27: qty 1

## 2023-05-27 MED ORDER — METOPROLOL SUCCINATE ER 25 MG PO TB24
50.0000 mg | ORAL_TABLET | Freq: Every day | ORAL | Status: DC
  Administered 2023-05-27: 50 mg via ORAL
  Filled 2023-05-27: qty 2

## 2023-05-27 MED ORDER — TAMSULOSIN HCL 0.4 MG PO CAPS
0.4000 mg | ORAL_CAPSULE | Freq: Every day | ORAL | Status: DC
  Administered 2023-05-27: 0.4 mg via ORAL
  Filled 2023-05-27: qty 1

## 2023-05-27 MED ORDER — MOMETASONE FURO-FORMOTEROL FUM 100-5 MCG/ACT IN AERO
2.0000 | INHALATION_SPRAY | Freq: Two times a day (BID) | RESPIRATORY_TRACT | Status: DC
Start: 2023-05-27 — End: 2023-05-27

## 2023-05-27 MED ORDER — APIXABAN 5 MG PO TABS
5.0000 mg | ORAL_TABLET | Freq: Two times a day (BID) | ORAL | Status: DC
  Administered 2023-05-27: 5 mg via ORAL
  Filled 2023-05-27: qty 1

## 2023-05-27 MED ORDER — AZITHROMYCIN 500 MG PO TABS: 500.0000 mg | ORAL_TABLET | Freq: Every day | ORAL | 0 refills | Status: DC

## 2023-05-27 MED ORDER — SODIUM CHLORIDE 0.9 % IV SOLN
1.0000 g | Freq: Once | INTRAVENOUS | Status: AC
  Administered 2023-05-27: 1 g via INTRAVENOUS
  Filled 2023-05-27: qty 10

## 2023-05-27 MED ORDER — LACTATED RINGERS IV SOLN: INTRAVENOUS | Status: DC

## 2023-05-27 MED ORDER — DOXYCYCLINE HYCLATE 100 MG PO TABS
100.0000 mg | ORAL_TABLET | Freq: Once | ORAL | Status: AC
  Administered 2023-05-27: 100 mg via ORAL
  Filled 2023-05-27: qty 1

## 2023-05-27 NOTE — ED Notes (Signed)
Pt ambulated with 1-assist and maintained an 02 sat of 96-100%.  States he feels his normal self.

## 2023-05-27 NOTE — ED Triage Notes (Signed)
Pt coming in via POV with wife. Pt was seen in yesterday and discharged today. Pt wife states they were called about blood cultures that resulted and told to come back for treatment. Patient alert and oriented x4. Denies SOB, pain, n/v/d.

## 2023-05-27 NOTE — ED Provider Triage Note (Signed)
Emergency Medicine Provider Triage Evaluation Note  [name] , a 89 y.o. male  was evaluated in triage.  Pt presents for abnormal labs. Was seen in ER yesterday and admitted, discharged earlier today. Was called and told his blood cultures were positive, recommended coming to ER.   Review of Systems  No complaints right now  Physical Exam  BP (!) 143/64 (BP Location: Left Arm)   Pulse 76   Temp (!) 97.5 F (36.4 C) (Oral)   Resp 20   Ht 6\' 2"  (1.88 m)   Wt 83.5 kg   SpO2 98%   BMI 23.62 kg/m  Gen:   Awake, no distress   Resp:  Normal effort  MSK:   Moves extremities without difficulty  Other:    Medical Decision Making  Medically screening exam initiated at 8:40 PM.  Appropriate orders placed.  TRYSON LUMLEY was informed that the remainder of the evaluation will be completed by another provider, this initial triage assessment does not replace that evaluation, and the importance of remaining in the ED until their evaluation is complete.  Per chart review, blood culture positive for staph aureus   Latitia Housewright T, PA-C 05/27/23 2040

## 2023-05-27 NOTE — Care Management CC44 (Signed)
Condition Code 44 Documentation Completed  Patient Details  Name: [name] MRN: [id number] Date of Birth: [birth year]   Condition Code 44 given:  Yes Patient signature on Condition Code 44 notice:  Yes Documentation of 2 MD's agreement:  Yes Code 44 added to claim:  Yes    Oletta Cohn, RN 05/27/2023, 12:33 PM

## 2023-05-27 NOTE — ED Notes (Signed)
ED TO INPATIENT HANDOFF REPORT  ED Nurse Name and Phone #: [phone]  S Name/Age/Gender [name] 89 y.o. male Room/Bed: 023C/023C  Code Status   Code Status: Prior  Home/SNF/Other Home Patient oriented to: self, place, time, and situation Is this baseline? Yes   Triage Complete: Triage complete  Chief Complaint MSSA bacteremia [R78.81, B95.61]  Triage Note Pt coming in via POV with wife. Pt was seen in yesterday and discharged today. Pt wife states they were called about blood cultures that resulted and told to come back for treatment. Patient alert and oriented x4. Denies SOB, pain, n/v/d.    Allergies Allergies  Allergen Reactions   Amoxicillin-Pot Clavulanate Diarrhea and Nausea Only   Losartan Other (See Comments) and Nausea And Vomiting    Elevated creatinine levels Elevated creatinine levels    Level of Care/Admitting Diagnosis ED Disposition     ED Disposition  Admit   Condition  --   Comment  Hospital Area: [facility] [[id number]]  Level of Care: Telemetry Medical [104]  May admit patient to [facility] or [facility] if equivalent level of care is available:: Yes  Covid Evaluation: Asymptomatic - no recent exposure (last 10 days) testing not required  Diagnosis: MSSA bacteremia [8119147]  Admitting Physician: Darlin Drop [[id number]]  Attending Physician: Darlin Drop [[id number]]  Certification:: I certify this patient will need inpatient services for at least 2 midnights  Expected Medical Readiness: 05/29/2023          B Medical/Surgery History Past Medical History:  Diagnosis Date   Actinic keratosis 03/13/2009   Arthritis    BENIGN PROSTATIC HYPERTROPHY, HX OF 04/07/2009   CARCINOMA, SKIN, SQUAMOUS CELL 09/11/2009   COLONIC POLYPS, HX OF 03/13/2009   Essential hypertension    GERD 03/13/2009   Hyperlipidemia    INSOMNIA, CHRONIC 09/11/2009   Persistent atrial fibrillation (HCC) 04/07/2009   a. s/p afib ablation at  [facility] x 2 in 2010.   POLYPECTOMY, HX OF 04/07/2009   Premature atrial contractions    PULMONARY NODULE 09/11/2009   PVC's (premature ventricular contractions)    Rosacea 03/13/2009   SKIN CANCER, HX OF 03/13/2009   Past Surgical History:  Procedure Laterality Date   ABLATION  2010   x2   COLONOSCOPY W/ BIOPSIES AND POLYPECTOMY     EYE SURGERY Bilateral    cataracts   LUMBAR DISC SURGERY     RUPTURE   LUMBAR LAMINECTOMY/DECOMPRESSION MICRODISCECTOMY N/A 04/20/2014   Procedure: LUMBAR TWO-THREE, LUMBAR THREE-FOUR, LUMAR FOUR-FIVE LAMINECTOMIES;  Surgeon: Tressie Stalker, MD;  Location: [facility];  Service: Neurosurgery;  Laterality: N/A;   POLYPECTOMY     SKIN CANCER EXCISION       A IV Location/Drains/Wounds Patient Lines/Drains/Airways Status     Active Line/Drains/Airways     Name Placement date Placement time Site Days   Peripheral IV 05/27/23 20 G Anterior;Distal;Right Forearm 05/27/23  2218  Forearm  less than 1   Peripheral IV 05/27/23 22 G Anterior;Distal;Left Forearm 05/27/23  2233  Forearm  less than 1            Intake/Output Last 24 hours  Intake/Output Summary (Last 24 hours) at 05/27/2023 2327 Last data filed at 05/27/2023 2326 Gross per 24 hour  Intake 100 ml  Output --  Net 100 ml    Labs/Imaging Results for orders placed or performed during the hospital encounter of 05/27/23 (from the past 48 hour(s))  Basic metabolic panel  Status: Abnormal   Collection Time: 05/27/23 10:15 PM  Result Value Ref Range   Sodium 138 135 - 145 mmol/L   Potassium 4.1 3.5 - 5.1 mmol/L   Chloride 101 98 - 111 mmol/L   CO2 23 22 - 32 mmol/L   Glucose, Bld 144 (H) 70 - 99 mg/dL    Comment: Glucose reference range applies only to samples taken after fasting for at least 8 hours.   BUN 28 (H) 8 - 23 mg/dL   Creatinine, Ser 4.09 (H) 0.61 - 1.24 mg/dL   Calcium 8.8 (L) 8.9 - 10.3 mg/dL   GFR, Estimated 47 (L) >60 mL/min    Comment: (NOTE) Calculated using the CKD-EPI  Creatinine Equation (2021)    Anion gap 14 5 - 15    Comment: Performed at [facility], [address], [place], [place] [address]  CBC with Differential     Status: Abnormal   Collection Time: 05/27/23 10:15 PM  Result Value Ref Range   WBC 7.3 4.0 - 10.5 K/uL   RBC 3.40 (L) 4.22 - 5.81 MIL/uL   Hemoglobin 10.9 (L) 13.0 - 17.0 g/dL   HCT 47.8 (L) 29.5 - 62.1 %   MCV 100.0 80.0 - 100.0 fL   MCH 32.1 26.0 - 34.0 pg   MCHC 32.1 30.0 - 36.0 g/dL   RDW 30.8 (H) 65.7 - 84.6 %   Platelets 167 150 - 400 K/uL   nRBC 0.0 0.0 - 0.2 %   Neutrophils Relative % 69 %   Neutro Abs 5.1 1.7 - 7.7 K/uL   Lymphocytes Relative 15 %   Lymphs Abs 1.1 0.7 - 4.0 K/uL   Monocytes Relative 14 %   Monocytes Absolute 1.0 0.1 - 1.0 K/uL   Eosinophils Relative 0 %   Eosinophils Absolute 0.0 0.0 - 0.5 K/uL   Basophils Relative 0 %   Basophils Absolute 0.0 0.0 - 0.1 K/uL   Immature Granulocytes 2 %   Abs Immature Granulocytes 0.13 (H) 0.00 - 0.07 K/uL    Comment: Performed at [facility], [address], [place], [place] [address]   DG Chest 2 View  Result Date: 05/26/2023 CLINICAL DATA:  Chest pain. EXAM: CHEST - 2 VIEW COMPARISON:  Chest radiograph dated 01/04/2020 FINDINGS: . faint area of increased density in the lingula may represent atelectasis or infiltrate. The right lung is clear. No pleural effusion or pneumothorax. Calcified pleural plaque of the left lung base. The cardiac silhouette is within normal limits. No acute osseous pathology. IMPRESSION: Lingular atelectasis or infiltrate. Electronically Signed   By: Elgie Collard M.D.   On: 05/26/2023 22:26    Pending Labs Unresulted Labs (From admission, onward)     Start     Ordered   05/27/23 2143  Blood culture (routine x 2)  BLOOD CULTURE X 2,   R (with STAT occurrences)      05/27/23 2142            Vitals/Pain Today's Vitals   05/27/23 2031 05/27/23 2035 05/27/23 2245  BP:  (!) 143/64 (!) 159/73  Pulse:  76 66   Resp:  20 20  Temp:  (!) 97.5 F (36.4 C) 98 F (36.7 C)  TempSrc:  Oral Oral  SpO2:  98% 99%  Weight: 83.5 kg    Height: 6\' 2"  (1.88 m)    PainSc: 0-No pain  0-No pain    Isolation Precautions No active isolations  Medications Medications  cefTRIAXone (ROCEPHIN)  1 g in sodium chloride 0.9 % 100 mL IVPB (0 g Intravenous Stopped 05/27/23 2326)  doxycycline (VIBRA-TABS) tablet 100 mg (100 mg Oral Given 05/27/23 2241)    Mobility walks with device     Focused Assessments See chart   R Recommendations: See Admitting Provider Note  Report given to:   Additional Notes: Pt is alert and oriented x 4. Wife at bedside

## 2023-05-27 NOTE — Discharge Summary (Signed)
Physician Discharge Summary  Patient: [name] ZOX:[id number] DOB: 08-28-1931   Code Status: Full Code Admit date: 05/26/2023 Discharge date: 05/27/2023 Disposition: Home, No home health services recommended PCP: Kristian Covey, MD  Recommendations for Outpatient Follow-up:  Follow up with PCP within 1-2 weeks Regarding general hospital follow up and preventative care  Discharge Diagnoses:  Principal Problem:   Sepsis due to pneumonia [facility]) Active Problems:   Acute encephalopathy   Essential hypertension   ATRIAL FIBRILLATION   CKD (chronic kidney disease) stage 3, GFR 30-59 ml/min (HCC)   Fever   Community acquired pneumonia  Brief Hospital Course Summary: [name] is a 89 y.o. male with medical history significant of A.Fib on eliquis, HTN.  [name] presented after appointment with pain management in which he began having chest wall pain and tremor. He was described as being lethargic by EMS who took his vitals of 100.4. BP 136/58 HR 84 Spo2-95%. Upon arrival to the ED, his vitals were stable and was afebrile.   significant findings initially: LA 2.8>2.3, Cr 1.62 (from baseline 1.3), and WBC 16.3. troponins 10>8 Chest xray showed lingular atelectasis or infiltrate concerning for infectious process.  ECG was sinus tachycardia. Has history of Afib and he was adherent with his eliquis.   Overnight, he did not receive any oxygen and returned to his baseline. The cxray finding may have represented a mild CAP which was treated with Abx continued on dc as listed below. He was ambulating at his baseline and discharged in stable condition.  Discharge Condition: Good, improved Recommended discharge diet: Regular healthy diet  Consultations: None   Procedures/Studies: None   Discharge Instructions     Discharge patient   Complete by: As directed    Discharge disposition: 01-Home or Self Care   Discharge patient date: 05/27/2023      Allergies as of  05/27/2023       Reactions   Amoxicillin-pot Clavulanate Diarrhea, Nausea Only   Losartan Other (See Comments), Nausea And Vomiting   Elevated creatinine levels Elevated creatinine levels        Medication List     TAKE these medications    acetaminophen 500 MG tablet Commonly known as: TYLENOL Take 500 mg by mouth every 6 (six) hours as needed for mild pain.   amLODipine 2.5 MG tablet Commonly known as: NORVASC TAKE 1 TABLET EVERY DAY   azithromycin 500 MG tablet Commonly known as: Zithromax Take 1 tablet (500 mg total) by mouth daily for 4 days.   budesonide-formoterol 80-4.5 MCG/ACT inhaler Commonly known as: Breyna Inhale 2 puffs into the lungs in the morning and at bedtime.   Eliquis 5 MG Tabs tablet Generic drug: apixaban TAKE 1 TABLET BY MOUTH TWICE A DAY   finasteride 5 MG tablet Commonly known as: PROSCAR TAKE 1 TABLET (5 MG TOTAL) BY MOUTH DAILY.   furosemide 40 MG tablet Commonly known as: LASIX TAKE 1 TABLET EVERY DAY AS NEEDED FOR LEG SWELLING   glucosamine-chondroitin 500-400 MG tablet Take 2 tablets by mouth daily.   lansoprazole 30 MG capsule Commonly known as: PREVACID TAKE 1 CAPSULE EVERY DAY AT 12 NOON.   lovastatin 20 MG tablet Commonly known as: MEVACOR TAKE 1 TABLET BY MOUTH EVERYDAY AT BEDTIME   metoprolol succinate 50 MG 24 hr tablet Commonly known as: TOPROL-XL TAKE 1 TABLET BY MOUTH DAILY. TAKE WITH OR IMMEDIATELY FOLLOWING A MEAL.   potassium chloride 10 MEQ tablet Commonly known as: KLOR-CON TAKE 2 TABLETS  BY MOUTH EVERY DAY   tamsulosin 0.4 MG Caps capsule Commonly known as: FLOMAX Take 1 capsule (0.4 mg total) by mouth daily.   vitamin B-12 500 MCG tablet Commonly known as: CYANOCOBALAMIN Take 500 mcg by mouth daily.   VITAMIN D PO Take 400 Units by mouth daily.        Follow-up Information     Connect with your PCP/Specialist as discussed. Schedule an appointment as soon as possible for a visit .   Contact  information: [email or web address] Call our physician referral line at [phone].        Kristian Covey, MD. Schedule an appointment as soon as possible for a visit in 1 week(s).   Specialty: Family Medicine Contact information: [address] [place] [address] [phone]                 Subjective   Pt reports no complaints. Not SOB or having chest pain. Feels like his baseline. Denies pain.   All questions and concerns were addressed at time of discharge.  Objective  Blood pressure (!) 129/53, pulse 72, temperature 98 F (36.7 C), temperature source Oral, resp. rate 20, height 6\' 2"  (1.88 m), weight 82.6 kg, SpO2 100%.   General: Pt is alert, awake, not in acute distress Cardiovascular: RRR, S1/S2 +, no rubs, no gallops Respiratory: CTA bilaterally, no wheezing, no rhonchi Abdominal: Soft, NT, ND, bowel sounds + Extremities: no edema, no cyanosis  The results of significant diagnostics from this hospitalization (including imaging, microbiology, ancillary and laboratory) are listed below for reference.   Imaging studies: DG Chest 2 View  Result Date: 05/26/2023 CLINICAL DATA:  Chest pain. EXAM: CHEST - 2 VIEW COMPARISON:  Chest radiograph dated 01/04/2020 FINDINGS: . faint area of increased density in the lingula may represent atelectasis or infiltrate. The right lung is clear. No pleural effusion or pneumothorax. Calcified pleural plaque of the left lung base. The cardiac silhouette is within normal limits. No acute osseous pathology. IMPRESSION: Lingular atelectasis or infiltrate. Electronically Signed   By: Elgie Collard M.D.   On: 05/26/2023 22:26    Labs: Basic Metabolic Panel: Recent Labs  Lab 05/26/23 2143 05/27/23 0246  NA 137 137  K 4.7 4.2  CL 99 104  CO2 24 23  GLUCOSE 168* 141*  BUN 27* 23  CREATININE 1.62* 1.43*  CALCIUM 9.1 8.4*   CBC: Recent Labs  Lab 05/26/23 2143 05/27/23 0246  WBC 16.3*  11.8*  NEUTROABS 13.5*  --   HGB 12.1* 10.5*  HCT 38.0* 33.4*  MCV 99.5 100.3*  PLT 191 160   Microbiology: Results for orders placed or performed during the hospital encounter of 05/26/23  Blood culture (routine x 2)     Status: None (Preliminary result)   Collection Time: 05/26/23 10:40 PM   Specimen: BLOOD  Result Value Ref Range Status   Specimen Description BLOOD SITE NOT SPECIFIED  Final   Special Requests   Final    BOTTLES DRAWN AEROBIC AND ANAEROBIC Blood Culture adequate volume   Culture  Setup Time   Final    GRAM POSITIVE COCCI IN CLUSTERS AEROBIC BOTTLE ONLY Organism ID to follow Performed at [facility], [address], [place], [place] [address]    Culture GRAM POSITIVE COCCI IN CLUSTERS  Final   Report Status PENDING  Incomplete  Blood culture (routine x 2)     Status: None (Preliminary result)   Collection Time: 05/26/23 10:40 PM   Specimen: BLOOD  Result Value Ref Range Status   Specimen Description BLOOD SITE NOT SPECIFIED  Final   Special Requests   Final    BOTTLES DRAWN AEROBIC AND ANAEROBIC Blood Culture adequate volume   Culture   Final    NO GROWTH < 12 HOURS Performed at [facility], [address] [place], [place] [address]    Report Status PENDING  Incomplete   Time coordinating discharge: Over 30 minutes  Leeroy Bock, MD  [facility] 05/27/2023, 12:12 PM

## 2023-05-27 NOTE — Care Management Obs Status (Signed)
MEDICARE OBSERVATION STATUS NOTIFICATION   Patient Details  Name: DAMANI KELEMEN MRN: [id number] Date of Birth: 1932-04-24   Medicare Observation Status Notification Given:  Yes    Oletta Cohn, RN 05/27/2023, 12:33 PM

## 2023-05-27 NOTE — ED Provider Notes (Signed)
[place] EMERGENCY DEPARTMENT AT [facility] Provider Note   CSN: [id number] Arrival date & time: 05/27/23  1938     History  Chief Complaint  Patient presents with   Abnormal Labs     [name] is a 89 y.o. male presenting to the ED with concern for positive blood cultures.  Patient was admitted overnight to the hospital 2 nights ago, discharged yesterday after him with antibiotics for suspected community pneumonia.  He received IV Rocephin and was discharged with azithromycin.  Subsequently today he was contacted and told to come back to the ED as his blood cultures were positive for MSSA bacteremia.  He says he feel significantly better than when he left in the hospital.  His fatigue and chills have improved.  He is here with his wife  HPI     Home Medications Prior to Admission medications   Medication Sig Start Date End Date Taking? Authorizing Provider  acetaminophen (TYLENOL) 500 MG tablet Take 500 mg by mouth every 6 (six) hours as needed for mild pain.   Yes [provider]  amLODipine (NORVASC) 2.5 MG tablet TAKE 1 TABLET EVERY DAY 03/10/23  Yes Burchette, Elberta Fortis, MD  budesonide-formoterol (BREYNA) 80-4.5 MCG/ACT inhaler Inhale 2 puffs into the lungs in the morning and at bedtime. 03/20/23  Yes Burchette, Elberta Fortis, MD  ELIQUIS 5 MG TABS tablet TAKE 1 TABLET BY MOUTH TWICE A DAY 05/25/23  Yes Turner, Traci R, MD  finasteride (PROSCAR) 5 MG tablet TAKE 1 TABLET (5 MG TOTAL) BY MOUTH DAILY. 03/12/23  Yes Burchette, Elberta Fortis, MD  furosemide (LASIX) 40 MG tablet TAKE 1 TABLET EVERY DAY AS NEEDED FOR LEG SWELLING 04/29/23  Yes Burchette, Elberta Fortis, MD  glucosamine-chondroitin 500-400 MG tablet Take 2 tablets by mouth daily.   Yes [provider]  lansoprazole (PREVACID) 30 MG capsule TAKE 1 CAPSULE EVERY DAY AT 12 NOON. 04/01/23  Yes Turner, Traci R, MD  lovastatin (MEVACOR) 20 MG tablet TAKE 1 TABLET BY MOUTH EVERYDAY AT BEDTIME 04/29/23  Yes Burchette,  Elberta Fortis, MD  metoprolol succinate (TOPROL-XL) 50 MG 24 hr tablet TAKE 1 TABLET BY MOUTH DAILY. TAKE WITH OR IMMEDIATELY FOLLOWING A MEAL. 03/09/23  Yes Turner, Cornelious Bryant, MD  potassium chloride (KLOR-CON) 10 MEQ tablet TAKE 2 TABLETS BY MOUTH EVERY DAY 10/06/22  Yes Burchette, Elberta Fortis, MD  tamsulosin (FLOMAX) 0.4 MG CAPS capsule Take 1 capsule (0.4 mg total) by mouth daily. 09/06/21  Yes Burchette, Elberta Fortis, MD  vitamin B-12 (CYANOCOBALAMIN) 500 MCG tablet Take 500 mcg by mouth daily.   Yes [provider]  VITAMIN D PO Take 400 Units by mouth daily.    Yes [provider]  azithromycin (ZITHROMAX) 500 MG tablet Take 1 tablet (500 mg total) by mouth daily for 4 days. Patient not taking: Reported on 05/27/2023 05/27/23 05/31/23  Leeroy Bock, MD      Allergies    Amoxicillin-pot clavulanate and Losartan    Review of Systems   Review of Systems  Physical Exam Updated Vital Signs BP (!) 159/73   Pulse 66   Temp 98 F (36.7 C) (Oral)   Resp 20   Ht 6\' 2"  (1.88 m)   Wt 83.5 kg   SpO2 99%   BMI 23.62 kg/m  Physical Exam Constitutional:      General: He is not in acute distress. HENT:     Head: Normocephalic and atraumatic.  Eyes:  Conjunctiva/sclera: Conjunctivae normal.     Pupils: Pupils are equal, round, and reactive to light.  Cardiovascular:     Rate and Rhythm: Normal rate. Rhythm irregular.  Pulmonary:     Effort: Pulmonary effort is normal. No respiratory distress.  Abdominal:     General: There is no distension.     Tenderness: There is no abdominal tenderness.  Skin:    General: Skin is warm and dry.  Neurological:     General: No focal deficit present.     Mental Status: He is alert. Mental status is at baseline.  Psychiatric:        Mood and Affect: Mood normal.        Behavior: Behavior normal.     ED Results / Procedures / Treatments   Labs (all labs ordered are listed, but only abnormal results are displayed) Labs Reviewed   CULTURE, BLOOD (ROUTINE X 2)  CULTURE, BLOOD (ROUTINE X 2)  BASIC METABOLIC PANEL  CBC WITH DIFFERENTIAL/PLATELET    EKG None  Radiology DG Chest 2 View  Result Date: 05/26/2023 CLINICAL DATA:  Chest pain. EXAM: CHEST - 2 VIEW COMPARISON:  Chest radiograph dated 01/04/2020 FINDINGS: . faint area of increased density in the lingula may represent atelectasis or infiltrate. The right lung is clear. No pleural effusion or pneumothorax. Calcified pleural plaque of the left lung base. The cardiac silhouette is within normal limits. No acute osseous pathology. IMPRESSION: Lingular atelectasis or infiltrate. Electronically Signed   By: Elgie Collard M.D.   On: 05/26/2023 22:26    Procedures Procedures    Medications Ordered in ED Medications  cefTRIAXone (ROCEPHIN) 1 g in sodium chloride 0.9 % 100 mL IVPB (1 g Intravenous New Bag/Given 05/27/23 2245)  doxycycline (VIBRA-TABS) tablet 100 mg (100 mg Oral Given 05/27/23 2241)    ED Course/ Medical Decision Making/ A&P Clinical Course as of 05/27/23 2304  Wed May 27, 2023  2257 Admitted to hospitalist [MT]    Clinical Course User Index [MT] Terald Sleeper, MD                                 Medical Decision Making Amount and/or Complexity of Data Reviewed Labs: ordered.  Risk Prescription drug management. Decision regarding hospitalization.   This patient presents to the ED with concern for bacteremia.   External records from outside source obtained and reviewed including cultures from yesterday showing staph coccal species and Staph aureus  I ordered and personally interpreted labs.  The pertinent results include: Basic labs, repeat blood cultures drawn   I ordered medication including IV Rocephin and doxycycline for continued treatment of community pneumonia, doxycycline for MSSA bacteremia  I have reviewed the patients home medicines and have made adjustments as needed   After the interventions noted above, I  reevaluated the patient and found that they have: improved  Dispostion:  After consideration of the diagnostic results and the patients response to treatment, I feel that the patent would benefit from readmission to the hospital on IV antibiotics now including staph coverage with IV doxycycline.  Per telephone note, ID team can be consulted tomorrow.  Repeat blood cultures drawn at this time        Final Clinical Impression(s) / ED Diagnoses Final diagnoses:  MSSA bacteremia    Rx / DC Orders ED Discharge Orders     None         Alvester Chou  J, MD 05/27/23 2304

## 2023-05-27 NOTE — Progress Notes (Signed)
Pt with mssa bacteremia  Discharged today on cap coverage po abx   Called patient and asked him to return to [facility] ed for direct admission and iv cefazolin and further w/u   Id service will see in consultation tomorrow

## 2023-05-28 ENCOUNTER — Inpatient Hospital Stay (HOSPITAL_COMMUNITY): Payer: Medicare (Managed Care)

## 2023-05-28 DIAGNOSIS — B9561 Methicillin susceptible Staphylococcus aureus infection as the cause of diseases classified elsewhere: Secondary | ICD-10-CM | POA: Diagnosis not present

## 2023-05-28 DIAGNOSIS — R7881 Bacteremia: Secondary | ICD-10-CM

## 2023-05-28 LAB — ECHOCARDIOGRAM COMPLETE
AR max vel: 3.16 cm2
AV Area VTI: 3.31 cm2
AV Area mean vel: 3.08 cm2
AV Mean grad: 2 mm[Hg]
AV Peak grad: 3.6 mm[Hg]
Ao pk vel: 0.95 m/s
Area-P 1/2: 2.76 cm2
Height: 74 in
S' Lateral: 3.7 cm
Weight: 2944 [oz_av]

## 2023-05-28 LAB — CBC
HCT: 29.8 % — ABNORMAL LOW (ref 39.0–52.0)
Hemoglobin: 9.4 g/dL — ABNORMAL LOW (ref 13.0–17.0)
MCH: 31.4 pg (ref 26.0–34.0)
MCHC: 31.5 g/dL (ref 30.0–36.0)
MCV: 99.7 fL (ref 80.0–100.0)
Platelets: 136 10*3/uL — ABNORMAL LOW (ref 150–400)
RBC: 2.99 MIL/uL — ABNORMAL LOW (ref 4.22–5.81)
RDW: 17.4 % — ABNORMAL HIGH (ref 11.5–15.5)
WBC: 5.4 10*3/uL (ref 4.0–10.5)
nRBC: 0 % (ref 0.0–0.2)

## 2023-05-28 LAB — GLUCOSE, CAPILLARY
Glucose-Capillary: 107 mg/dL — ABNORMAL HIGH (ref 70–99)
Glucose-Capillary: 118 mg/dL — ABNORMAL HIGH (ref 70–99)
Glucose-Capillary: 133 mg/dL — ABNORMAL HIGH (ref 70–99)
Glucose-Capillary: 144 mg/dL — ABNORMAL HIGH (ref 70–99)
Glucose-Capillary: 150 mg/dL — ABNORMAL HIGH (ref 70–99)

## 2023-05-28 LAB — BASIC METABOLIC PANEL
Anion gap: 9 (ref 5–15)
BUN: 25 mg/dL — ABNORMAL HIGH (ref 8–23)
CO2: 22 mmol/L (ref 22–32)
Calcium: 8 mg/dL — ABNORMAL LOW (ref 8.9–10.3)
Chloride: 107 mmol/L (ref 98–111)
Creatinine, Ser: 1.34 mg/dL — ABNORMAL HIGH (ref 0.61–1.24)
GFR, Estimated: 51 mL/min — ABNORMAL LOW (ref >60)
Glucose, Bld: 101 mg/dL — ABNORMAL HIGH (ref 70–99)
Potassium: 3.7 mmol/L (ref 3.5–5.1)
Sodium: 138 mmol/L (ref 135–145)

## 2023-05-28 LAB — MAGNESIUM: Magnesium: 1.9 mg/dL (ref 1.7–2.4)

## 2023-05-28 LAB — PHOSPHORUS: Phosphorus: 2.5 mg/dL (ref 2.5–4.6)

## 2023-05-28 LAB — HEMOGLOBIN A1C
Hgb A1c MFr Bld: 6.1 % — ABNORMAL HIGH (ref 4.8–5.6)
Mean Plasma Glucose: 128.37 mg/dL

## 2023-05-28 MED ORDER — FINASTERIDE 5 MG PO TABS
5.0000 mg | ORAL_TABLET | Freq: Every day | ORAL | Status: DC
  Administered 2023-05-28 – 2023-06-02 (×6): 5 mg via ORAL
  Filled 2023-05-28 (×6): qty 1

## 2023-05-28 MED ORDER — INFLUENZA VAC A&B SURF ANT ADJ 0.5 ML IM SUSY
0.5000 mL | PREFILLED_SYRINGE | INTRAMUSCULAR | Status: AC
  Administered 2023-06-01: 0.5 mL via INTRAMUSCULAR
  Filled 2023-05-28: qty 0.5

## 2023-05-28 MED ORDER — PANTOPRAZOLE SODIUM 40 MG PO TBEC
40.0000 mg | DELAYED_RELEASE_TABLET | Freq: Every day | ORAL | Status: DC
  Administered 2023-05-28 – 2023-06-02 (×6): 40 mg via ORAL
  Filled 2023-05-28 (×6): qty 1

## 2023-05-28 MED ORDER — VITAMIN B-12 1000 MCG PO TABS
500.0000 ug | ORAL_TABLET | Freq: Every day | ORAL | Status: DC
  Administered 2023-05-28 – 2023-06-02 (×6): 500 ug via ORAL
  Filled 2023-05-28 (×6): qty 1

## 2023-05-28 MED ORDER — INSULIN ASPART 100 UNIT/ML IJ SOLN: 0.0000 [IU] | Freq: Every day | INTRAMUSCULAR | Status: DC

## 2023-05-28 MED ORDER — PRAVASTATIN SODIUM 40 MG PO TABS
40.0000 mg | ORAL_TABLET | Freq: Every day | ORAL | Status: DC
  Administered 2023-05-28 – 2023-06-01 (×5): 40 mg via ORAL
  Filled 2023-05-28 (×5): qty 1

## 2023-05-28 MED ORDER — METOPROLOL SUCCINATE ER 25 MG PO TB24
12.5000 mg | ORAL_TABLET | Freq: Every day | ORAL | Status: DC
  Administered 2023-05-28 – 2023-06-02 (×6): 12.5 mg via ORAL
  Filled 2023-05-28 (×6): qty 1

## 2023-05-28 MED ORDER — SODIUM CHLORIDE 0.9 % IV SOLN: INTRAVENOUS | Status: AC

## 2023-05-28 MED ORDER — POLYETHYLENE GLYCOL 3350 17 G PO PACK: 17.0000 g | PACK | Freq: Every day | ORAL | Status: DC | PRN

## 2023-05-28 MED ORDER — SODIUM CHLORIDE 0.9 % IV SOLN
500.0000 mg | Freq: Every day | INTRAVENOUS | Status: DC
  Administered 2023-05-28: 500 mg via INTRAVENOUS
  Filled 2023-05-28: qty 5

## 2023-05-28 MED ORDER — APIXABAN 5 MG PO TABS
5.0000 mg | ORAL_TABLET | Freq: Two times a day (BID) | ORAL | Status: DC
  Administered 2023-05-28 – 2023-06-02 (×12): 5 mg via ORAL
  Filled 2023-05-28 (×12): qty 1

## 2023-05-28 MED ORDER — TAMSULOSIN HCL 0.4 MG PO CAPS
0.4000 mg | ORAL_CAPSULE | Freq: Every day | ORAL | Status: DC
  Administered 2023-05-28 – 2023-06-02 (×6): 0.4 mg via ORAL
  Filled 2023-05-28 (×6): qty 1

## 2023-05-28 MED ORDER — CEFAZOLIN SODIUM-DEXTROSE 2-4 GM/100ML-% IV SOLN
2.0000 g | Freq: Three times a day (TID) | INTRAVENOUS | Status: DC
  Administered 2023-05-28 – 2023-06-02 (×17): 2 g via INTRAVENOUS
  Filled 2023-05-28 (×16): qty 100

## 2023-05-28 MED ORDER — SODIUM CHLORIDE 0.9 % IV SOLN: 2.0000 g | INTRAVENOUS | Status: DC

## 2023-05-28 MED ORDER — MELATONIN 5 MG PO TABS
5.0000 mg | ORAL_TABLET | Freq: Every evening | ORAL | Status: DC | PRN
  Filled 2023-05-28: qty 1

## 2023-05-28 MED ORDER — PROCHLORPERAZINE EDISYLATE 10 MG/2ML IJ SOLN: 5.0000 mg | Freq: Four times a day (QID) | INTRAMUSCULAR | Status: DC | PRN

## 2023-05-28 MED ORDER — ACETAMINOPHEN 325 MG PO TABS
650.0000 mg | ORAL_TABLET | Freq: Four times a day (QID) | ORAL | Status: DC | PRN
  Administered 2023-05-30 – 2023-05-31 (×3): 650 mg via ORAL
  Filled 2023-05-28 (×3): qty 2

## 2023-05-28 MED ORDER — INSULIN ASPART 100 UNIT/ML IJ SOLN
0.0000 [IU] | Freq: Three times a day (TID) | INTRAMUSCULAR | Status: DC
  Administered 2023-05-28 (×2): 1 [IU] via SUBCUTANEOUS

## 2023-05-28 NOTE — Progress Notes (Signed)
PROGRESS NOTE  [name] NWG:[id number] DOB: [birth year]   PCP: Kristian Covey, MD  Patient is from: Home.  Lives with his wife.  DOA: 05/27/2023 LOS: 1  Chief complaints Chief Complaint  Patient presents with   Abnormal Labs      Brief Narrative / Interim history: 89 year old M with PMH of paroxysmal A-fib on Eliquis, HTN, CKD-3B and recent hospitalization for CAP returning to the hospital a day after discharge due to blood culture showing MSSA bacteremia.  He was hemodynamically stable.  Labs without leukocytosis or significant finding.  Blood cultures drawn.  He was initially started on IV ceftriaxone and doxycycline, and continued on IV Ancef and azithromycin.   Subjective: Seen and examined earlier this morning.  No major events overnight of this morning.  Has no specific complaints.  No significant cough.  Denies shortness of breath.  Felt some palpitation early in the morning from his A-fib.  No GI or UTI symptoms.  Objective: Vitals:   05/27/23 2345 05/28/23 0029 05/28/23 0551 05/28/23 0821  BP: (!) 146/64 119/82 (!) 154/52 (!) 139/109  Pulse: 68 72 72 76  Resp: (!) 21 18 18  (!) 27  Temp:  98.1 F (36.7 C) 98.3 F (36.8 C) 98.1 F (36.7 C)  TempSrc:    Oral  SpO2: 97% 98% 98% 97%  Weight:      Height:        Examination:  GENERAL: No apparent distress.  Nontoxic. HEENT: MMM.  Vision and hearing grossly intact.  NECK: Supple.  No apparent JVD.  RESP:  No IWOB.  Fair aeration bilaterally. CVS: Irregular rhythm.  Normal rate.  Heart sounds normal.  ABD/GI/GU: BS+. Abd soft, NTND.  MSK/EXT:  Moves extremities. No apparent deformity. No edema.  SKIN: no apparent skin lesion or wound NEURO: Awake, alert and oriented appropriately.  No apparent focal neuro deficit. PSYCH: Calm. Normal affect.   Procedures:  None  Microbiology summarized: 10/1-blood culture with MSSA 10/2-blood cultures NGTD  Assessment and plan: Principal Problem:   MSSA  bacteremia  MSSA bacteremia: POA.  Pulmonary source?  Has no ulcers.  Patient appears well.  Hemodynamically stable.  No fever or leukocytosis.  Lactic acid within normal. -Continue IV Ancef -Would continue Zithromax to complete course for pneumonia -TTE+/-TEE -Follow ID recommendations  Community-acquired pneumonia: POA.  No significant respiratory distress. -Antibiotics as above -Incentive spirometry   Paroxysmal A-fib on Eliquis: Rate controlled. -Continue home Toprol-XL and Eliquis -Optimize electrolytes  Hyperglycemia: No history of diabetes.  Last A1c 5.9% on 02/03/2023 Recent Labs  Lab 05/28/23 0043 05/28/23 0823  GLUCAP 133* 150*  -Recheck hemoglobin A1c - Continue SSI for now   Hyperlipidemia -Continue home statin   BPH -On Proscar and Flomax   GERD -Continue PPI  Physical deconditioning -PT/OT      Body mass index is 23.62 kg/m.          DVT prophylaxis:   apixaban (ELIQUIS) tablet 5 mg  Code Status: Full code Family Communication: Updated patient's wife at bedside Level of care: Telemetry Medical Status is: Inpatient Remains inpatient appropriate because: MSSA bacteremia   Final disposition: Home Consultants:  Infectious disease  35 minutes with more than 50% spent in reviewing records, counseling patient/family and coordinating care.   Sch Meds:  Scheduled Meds:  apixaban  5 mg Oral BID   cyanocobalamin  500 mcg Oral Daily   finasteride  5 mg Oral Daily   [START ON 05/29/2023] influenza vaccine adjuvanted  0.5 mL  Intramuscular Tomorrow-1000   insulin aspart  0-5 Units Subcutaneous QHS   insulin aspart  0-9 Units Subcutaneous TID WC   metoprolol succinate  12.5 mg Oral Daily   pantoprazole  40 mg Oral Daily   pravastatin  40 mg Oral q1800   tamsulosin  0.4 mg Oral Daily   Continuous Infusions:  sodium chloride 50 mL/hr at 05/28/23 0103    ceFAZolin (ANCEF) IV Stopped (05/28/23 0748)   PRN Meds:.acetaminophen, melatonin,  polyethylene glycol, prochlorperazine  Antimicrobials: Anti-infectives (From admission, onward)    Start     Dose/Rate Route Frequency Ordered Stop   05/28/23 1000  cefTRIAXone (ROCEPHIN) 2 g in sodium chloride 0.9 % 100 mL IVPB  Status:  Discontinued        2 g 200 mL/hr over 30 Minutes Intravenous Every 24 hours 05/28/23 0022 05/28/23 0026   05/28/23 0115  ceFAZolin (ANCEF) IVPB 2g/100 mL premix        2 g 200 mL/hr over 30 Minutes Intravenous Every 8 hours 05/28/23 0026     05/28/23 0100  azithromycin (ZITHROMAX) 500 mg in sodium chloride 0.9 % 250 mL IVPB  Status:  Discontinued        500 mg 250 mL/hr over 60 Minutes Intravenous Daily at bedtime 05/28/23 0022 05/28/23 1052   05/27/23 2200  cefTRIAXone (ROCEPHIN) 1 g in sodium chloride 0.9 % 100 mL IVPB        1 g 200 mL/hr over 30 Minutes Intravenous  Once 05/27/23 2145 05/27/23 2326   05/27/23 2200  doxycycline (VIBRA-TABS) tablet 100 mg        100 mg Oral  Once 05/27/23 2145 05/27/23 2241        I have personally reviewed the following labs and images: CBC: Recent Labs  Lab 05/26/23 2143 05/27/23 0246 05/27/23 2215 05/28/23 0533  WBC 16.3* 11.8* 7.3 5.4  NEUTROABS 13.5*  --  5.1  --   HGB 12.1* 10.5* 10.9* 9.4*  HCT 38.0* 33.4* 34.0* 29.8*  MCV 99.5 100.3* 100.0 99.7  PLT 191 160 167 136*   BMP &GFR Recent Labs  Lab 05/26/23 2143 05/27/23 0246 05/27/23 2215 05/28/23 0533  NA 137 137 138 138  K 4.7 4.2 4.1 3.7  CL 99 104 101 107  CO2 24 23 23 22   GLUCOSE 168* 141* 144* 101*  BUN 27* 23 28* 25*  CREATININE 1.62* 1.43* 1.43* 1.34*  CALCIUM 9.1 8.4* 8.8* 8.0*  MG  --   --   --  1.9  PHOS  --   --   --  2.5   Estimated Creatinine Clearance: 43.5 mL/min (A) (by C-G formula based on SCr of 1.34 mg/dL (H)). Liver & Pancreas: Recent Labs  Lab 05/26/23 2143  AST 19  ALT 13  ALKPHOS 53  BILITOT 1.1  PROT 6.1*  ALBUMIN 3.9   No results for input(s): "LIPASE", "AMYLASE" in the last 168 hours. No results  for input(s): "AMMONIA" in the last 168 hours. Diabetic: No results for input(s): "HGBA1C" in the last 72 hours. Recent Labs  Lab 05/28/23 0043 05/28/23 0823  GLUCAP 133* 150*   Cardiac Enzymes: No results for input(s): "CKTOTAL", "CKMB", "CKMBINDEX", "TROPONINI" in the last 168 hours. No results for input(s): "PROBNP" in the last 8760 hours. Coagulation Profile: No results for input(s): "INR", "PROTIME" in the last 168 hours. Thyroid Function Tests: No results for input(s): "TSH", "T4TOTAL", "FREET4", "T3FREE", "THYROIDAB" in the last 72 hours. Lipid Profile: No results for input(s): "CHOL", "HDL", "  LDLCALC", "TRIG", "CHOLHDL", "LDLDIRECT" in the last 72 hours. Anemia Panel: No results for input(s): "VITAMINB12", "FOLATE", "FERRITIN", "TIBC", "IRON", "RETICCTPCT" in the last 72 hours. Urine analysis:    Component Value Date/Time   COLORURINE YELLOW 05/26/2023 2121   APPEARANCEUR CLEAR 05/26/2023 2121   LABSPEC 1.017 05/26/2023 2121   PHURINE 6.0 05/26/2023 2121   GLUCOSEU NEGATIVE 05/26/2023 2121   HGBUR NEGATIVE 05/26/2023 2121   BILIRUBINUR NEGATIVE 05/26/2023 2121   BILIRUBINUR Negative 04/29/2023 1342   KETONESUR NEGATIVE 05/26/2023 2121   PROTEINUR NEGATIVE 05/26/2023 2121   UROBILINOGEN 0.2 04/29/2023 1342   UROBILINOGEN 1 02/23/2014 1404   NITRITE NEGATIVE 05/26/2023 2121   LEUKOCYTESUR NEGATIVE 05/26/2023 2121   Sepsis Labs: Invalid input(s): "PROCALCITONIN", "LACTICIDVEN"  Microbiology: Recent Results (from the past 240 hour(s))  Blood culture (routine x 2)     Status: Abnormal (Preliminary result)   Collection Time: 05/26/23 10:40 PM   Specimen: BLOOD  Result Value Ref Range Status   Specimen Description BLOOD SITE NOT SPECIFIED  Final   Special Requests   Final    BOTTLES DRAWN AEROBIC AND ANAEROBIC Blood Culture adequate volume   Culture  Setup Time   Final    GRAM POSITIVE COCCI IN CLUSTERS IN BOTH AEROBIC AND ANAEROBIC BOTTLES CRITICAL RESULT CALLED  TO, READ BACK BY AND VERIFIED WITH: RN NIKKI KOHUT ON 05/27/23 @ 1715 BY DRT Performed at [facility], [address] [place], [place] [address]    Culture STAPHYLOCOCCUS AUREUS SUSCEPTIBILITIES TO FOLLOW (A)  Final   Report Status PENDING  Incomplete  Blood culture (routine x 2)     Status: None (Preliminary result)   Collection Time: 05/26/23 10:40 PM   Specimen: BLOOD  Result Value Ref Range Status   Specimen Description BLOOD SITE NOT SPECIFIED  Final   Special Requests   Final    BOTTLES DRAWN AEROBIC AND ANAEROBIC Blood Culture adequate volume   Culture  Setup Time   Final    GRAM POSITIVE COCCI IN CLUSTERS ANAEROBIC BOTTLE ONLY CRITICAL VALUE NOTED.  VALUE IS CONSISTENT WITH PREVIOUSLY REPORTED AND CALLED VALUE. Performed at [facility], [address] [place], [place] [address]    Culture GRAM POSITIVE COCCI IN CLUSTERS  Final   Report Status PENDING  Incomplete  Blood Culture ID Panel (Reflexed)     Status: Abnormal   Collection Time: 05/26/23 10:40 PM  Result Value Ref Range Status   Enterococcus faecalis NOT DETECTED NOT DETECTED Final   Enterococcus Faecium NOT DETECTED NOT DETECTED Final   Listeria monocytogenes NOT DETECTED NOT DETECTED Final   Staphylococcus species DETECTED (A) NOT DETECTED Final    Comment: CRITICAL RESULT CALLED TO, READ BACK BY AND VERIFIED WITH: RN NIKKI KOHUT ON 05/27/23 @ 1715 BY DRT    Staphylococcus aureus (BCID) DETECTED (A) NOT DETECTED Final    Comment: CRITICAL RESULT CALLED TO, READ BACK BY AND VERIFIED WITH: RN NIKKI KOHUT ON 05/27/23 @ 1715 BY DRT    Staphylococcus epidermidis NOT DETECTED NOT DETECTED Final   Staphylococcus lugdunensis NOT DETECTED NOT DETECTED Final   Streptococcus species NOT DETECTED NOT DETECTED Final   Streptococcus agalactiae NOT DETECTED NOT DETECTED Final   Streptococcus pneumoniae NOT DETECTED NOT DETECTED Final   Streptococcus pyogenes NOT DETECTED NOT DETECTED Final    A.calcoaceticus-baumannii NOT DETECTED NOT DETECTED Final   Bacteroides fragilis NOT DETECTED NOT DETECTED Final   Enterobacterales NOT DETECTED NOT DETECTED Final   Enterobacter cloacae complex NOT DETECTED  NOT DETECTED Final   Escherichia coli NOT DETECTED NOT DETECTED Final   Klebsiella aerogenes NOT DETECTED NOT DETECTED Final   Klebsiella oxytoca NOT DETECTED NOT DETECTED Final   Klebsiella pneumoniae NOT DETECTED NOT DETECTED Final   Proteus species NOT DETECTED NOT DETECTED Final   Salmonella species NOT DETECTED NOT DETECTED Final   Serratia marcescens NOT DETECTED NOT DETECTED Final   Haemophilus influenzae NOT DETECTED NOT DETECTED Final   Neisseria meningitidis NOT DETECTED NOT DETECTED Final   Pseudomonas aeruginosa NOT DETECTED NOT DETECTED Final   Stenotrophomonas maltophilia NOT DETECTED NOT DETECTED Final   Candida albicans NOT DETECTED NOT DETECTED Final   Candida auris NOT DETECTED NOT DETECTED Final   Candida glabrata NOT DETECTED NOT DETECTED Final   Candida krusei NOT DETECTED NOT DETECTED Final   Candida parapsilosis NOT DETECTED NOT DETECTED Final   Candida tropicalis NOT DETECTED NOT DETECTED Final   Cryptococcus neoformans/gattii NOT DETECTED NOT DETECTED Final   Meth resistant mecA/C and MREJ NOT DETECTED NOT DETECTED Final    Comment: Performed at [facility], [address] [place], [place] [address]  Blood culture (routine x 2)     Status: None (Preliminary result)   Collection Time: 05/27/23 10:20 PM   Specimen: BLOOD  Result Value Ref Range Status   Specimen Description BLOOD BLOOD RIGHT WRIST  Final   Special Requests   Final    BOTTLES DRAWN AEROBIC AND ANAEROBIC Blood Culture adequate volume   Culture   Final    NO GROWTH < 12 HOURS Performed at [facility], [address], [place], [place] [address]    Report Status PENDING  Incomplete  Blood culture (routine x 2)     Status: None (Preliminary result)   Collection Time: 05/27/23  10:20 PM   Specimen: BLOOD  Result Value Ref Range Status   Specimen Description BLOOD BLOOD LEFT WRIST  Final   Special Requests   Final    BOTTLES DRAWN AEROBIC AND ANAEROBIC Blood Culture results may not be optimal due to an inadequate volume of blood received in culture bottles   Culture   Final    NO GROWTH < 12 HOURS Performed at [facility], [address] [place], [place] [address]    Report Status PENDING  Incomplete    Radiology Studies: No results found.    Christophor Eick T. Demont Linford [facility]  If 7PM-7AM, please contact night-coverage [email or web address] 05/28/2023, 11:24 AM

## 2023-05-28 NOTE — Progress Notes (Signed)
*  PRELIMINARY RESULTS* Echocardiogram 2D Echocardiogram has been performed.  [name] [name] 05/28/2023, 2:50 PM

## 2023-05-28 NOTE — H&P (Addendum)
History and Physical  [name] RJJ:[id number] DOB: 1931-10-07 DOA: 05/27/2023  Referring physician: Dr. Renaye Rakers, EDP  PCP: Kristian Covey, MD  Outpatient Specialists: Cardiology Patient coming from: Home.  Chief Complaint: Positive blood cultures x 2.  HPI: [name] is a 89 y.o. male with medical history significant for paroxysmal on Eliquis, hypertension, CKD 3B, recently admitted on 05/26/2023 for community-acquired pneumonia and discharged yesterday who presented due to positive blood cultures x 2, growing Staph aureus, sensitivities are pending.  The patient was called back for IV antibiotics.  In the ED, denies having any subjective fevers or chills.  Mildly tachypneic with respiratory rate of 21.  The patient was started on IV Rocephin and doxycycline.  TRH, hospitalist service, was asked to admit.  ED Course: Temperature 98.  BP 159/73, pulse 66, respiratory rate 21, O2 saturation 99% on room air.  Lab studies notable for serum glucose 144, BUN 28, creatinine 1.43, GFR 47.  Review of Systems: Review of systems as noted in the HPI. All other systems reviewed and are negative.   Past Medical History:  Diagnosis Date   Actinic keratosis 03/13/2009   Arthritis    BENIGN PROSTATIC HYPERTROPHY, HX OF 04/07/2009   CARCINOMA, SKIN, SQUAMOUS CELL 09/11/2009   COLONIC POLYPS, HX OF 03/13/2009   Essential hypertension    GERD 03/13/2009   Hyperlipidemia    INSOMNIA, CHRONIC 09/11/2009   Persistent atrial fibrillation (HCC) 04/07/2009   a. s/p afib ablation at [facility] x 2 in 2010.   POLYPECTOMY, HX OF 04/07/2009   Premature atrial contractions    PULMONARY NODULE 09/11/2009   PVC's (premature ventricular contractions)    Rosacea 03/13/2009   SKIN CANCER, HX OF 03/13/2009   Past Surgical History:  Procedure Laterality Date   ABLATION  2010   x2   COLONOSCOPY W/ BIOPSIES AND POLYPECTOMY     EYE SURGERY Bilateral    cataracts   LUMBAR DISC SURGERY     RUPTURE   LUMBAR  LAMINECTOMY/DECOMPRESSION MICRODISCECTOMY N/A 04/20/2014   Procedure: LUMBAR TWO-THREE, LUMBAR THREE-FOUR, LUMAR FOUR-FIVE LAMINECTOMIES;  Surgeon: Tressie Stalker, MD;  Location: [facility];  Service: Neurosurgery;  Laterality: N/A;   POLYPECTOMY     SKIN CANCER EXCISION      Social History:  reports that he quit smoking about 35 years ago. His smoking use included cigarettes. He started smoking about 65 years ago. He has a 30 pack-year smoking history. His smokeless tobacco use includes chew. He reports that he does not drink alcohol and does not use drugs.   Allergies  Allergen Reactions   Amoxicillin-Pot Clavulanate Diarrhea and Nausea Only   Losartan Other (See Comments) and Nausea And Vomiting    Elevated creatinine levels Elevated creatinine levels    Family History  Problem Relation Age of Onset   Cancer Mother    Cancer Father 44       COLON   Heart attack Father    Heart disease Father    Heart disease Sister    CAD Sister    Heart disease Brother    CAD Brother    CAD Brother    Heart disease Brother       Prior to Admission medications   Medication Sig Start Date End Date Taking? Authorizing Provider  acetaminophen (TYLENOL) 500 MG tablet Take 500 mg by mouth every 6 (six) hours as needed for mild pain.   Yes [provider]  amLODipine (NORVASC) 2.5 MG tablet TAKE 1 TABLET  EVERY DAY 03/10/23  Yes Burchette, Elberta Fortis, MD  budesonide-formoterol (BREYNA) 80-4.5 MCG/ACT inhaler Inhale 2 puffs into the lungs in the morning and at bedtime. 03/20/23  Yes Burchette, Elberta Fortis, MD  ELIQUIS 5 MG TABS tablet TAKE 1 TABLET BY MOUTH TWICE A DAY 05/25/23  Yes Turner, Traci R, MD  finasteride (PROSCAR) 5 MG tablet TAKE 1 TABLET (5 MG TOTAL) BY MOUTH DAILY. 03/12/23  Yes Burchette, Elberta Fortis, MD  furosemide (LASIX) 40 MG tablet TAKE 1 TABLET EVERY DAY AS NEEDED FOR LEG SWELLING 04/29/23  Yes Burchette, Elberta Fortis, MD  glucosamine-chondroitin 500-400 MG tablet Take 2 tablets by mouth  daily.   Yes [provider]  lansoprazole (PREVACID) 30 MG capsule TAKE 1 CAPSULE EVERY DAY AT 12 NOON. 04/01/23  Yes Turner, Traci R, MD  lovastatin (MEVACOR) 20 MG tablet TAKE 1 TABLET BY MOUTH EVERYDAY AT BEDTIME 04/29/23  Yes Burchette, Elberta Fortis, MD  metoprolol succinate (TOPROL-XL) 50 MG 24 hr tablet TAKE 1 TABLET BY MOUTH DAILY. TAKE WITH OR IMMEDIATELY FOLLOWING A MEAL. 03/09/23  Yes Turner, Cornelious Bryant, MD  potassium chloride (KLOR-CON) 10 MEQ tablet TAKE 2 TABLETS BY MOUTH EVERY DAY 10/06/22  Yes Burchette, Elberta Fortis, MD  tamsulosin (FLOMAX) 0.4 MG CAPS capsule Take 1 capsule (0.4 mg total) by mouth daily. 09/06/21  Yes Burchette, Elberta Fortis, MD  vitamin B-12 (CYANOCOBALAMIN) 500 MCG tablet Take 500 mcg by mouth daily.   Yes [provider]  VITAMIN D PO Take 400 Units by mouth daily.    Yes [provider]  azithromycin (ZITHROMAX) 500 MG tablet Take 1 tablet (500 mg total) by mouth daily for 4 days. Patient not taking: Reported on 05/27/2023 05/27/23 05/31/23  Leeroy Bock, MD    Physical Exam: BP (!) 146/64   Pulse 68   Temp 98 F (36.7 C) (Oral)   Resp (!) 21   Ht 6\' 2"  (1.88 m)   Wt 83.5 kg   SpO2 97%   BMI 23.62 kg/m   General: 89 y.o. year-old male well developed well nourished in no acute distress.  Alert and oriented x3.  Hard of hearing. Cardiovascular: Regular rate and rhythm with no rubs or gallops.  No thyromegaly or JVD noted.  No lower extremity edema. 2/4 pulses in all 4 extremities. Respiratory: Mild rales at bases.  Poor inspiratory effort. Abdomen: Soft nontender nondistended with normal bowel sounds x4 quadrants. Muskuloskeletal: No cyanosis, clubbing or edema noted bilaterally Neuro: CN II-XII intact, strength, sensation, reflexes Skin: No ulcerative lesions noted or rashes Psychiatry: Judgement and insight appear normal. Mood is appropriate for condition and setting          Labs on Admission:  Basic Metabolic Panel: Recent Labs  Lab  05/26/23 2143 05/27/23 0246 05/27/23 2215  NA 137 137 138  K 4.7 4.2 4.1  CL 99 104 101  CO2 24 23 23   GLUCOSE 168* 141* 144*  BUN 27* 23 28*  CREATININE 1.62* 1.43* 1.43*  CALCIUM 9.1 8.4* 8.8*   Liver Function Tests: Recent Labs  Lab 05/26/23 2143  AST 19  ALT 13  ALKPHOS 53  BILITOT 1.1  PROT 6.1*  ALBUMIN 3.9   No results for input(s): "LIPASE", "AMYLASE" in the last 168 hours. No results for input(s): "AMMONIA" in the last 168 hours. CBC: Recent Labs  Lab 05/26/23 2143 05/27/23 0246 05/27/23 2215  WBC 16.3* 11.8* 7.3  NEUTROABS 13.5*  --  5.1  HGB 12.1* 10.5* 10.9*  HCT  38.0* 33.4* 34.0*  MCV 99.5 100.3* 100.0  PLT 191 160 167   Cardiac Enzymes: No results for input(s): "CKTOTAL", "CKMB", "CKMBINDEX", "TROPONINI" in the last 168 hours.  BNP (last 3 results) No results for input(s): "BNP" in the last 8760 hours.  ProBNP (last 3 results) No results for input(s): "PROBNP" in the last 8760 hours.  CBG: No results for input(s): "GLUCAP" in the last 168 hours.  Radiological Exams on Admission: DG Chest 2 View  Result Date: 05/26/2023 CLINICAL DATA:  Chest pain. EXAM: CHEST - 2 VIEW COMPARISON:  Chest radiograph dated 01/04/2020 FINDINGS: . faint area of increased density in the lingula may represent atelectasis or infiltrate. The right lung is clear. No pleural effusion or pneumothorax. Calcified pleural plaque of the left lung base. The cardiac silhouette is within normal limits. No acute osseous pathology. IMPRESSION: Lingular atelectasis or infiltrate. Electronically Signed   By: Elgie Collard M.D.   On: 05/26/2023 22:26    EKG: I independently viewed the EKG done and my findings are as followed: None available at the time of this visit.  Assessment/Plan Present on Admission:  MSSA bacteremia  Principal Problem:   MSSA bacteremia  MSSA bacteremia, POA Recently treated for pneumonia x 2 days Received 1 dose of Rocephin and 1 dose of doxycycline  in the ED Start IV Ancef 3 times daily Gentle IV fluid hydration NS at 50 cc/h x 1 day ID consult placed via secure chat, Dr. Renold Don Resume azithromycin to complete course of antibiotics for pneumonia Follow sensitivities and repeat blood cultures x 2 peripherally. Obtain TTE and follow results Monitor on telemetry medical unit.  Recent community-acquired pneumonia, POA Ancef and IV azithromycin Obtain baseline procalcitonin Currently not hypoxic  Paroxysmal A-fib on Eliquis Resume home Eliquis and home Toprol-XL Closely monitor on telemetry.  Hyperlipidemia Resume home lovastatin  BPH Resume home Proscar and Flomax  GERD Resume home PPI   Time: 75 minutes   DVT prophylaxis: Home Eliquis  Code Status: Full code  Family Communication: None at bedside  Disposition Plan: Admitted to telemetry medical unit  Consults called: Infectious disease  Admission status: Inpatient status.   Status is: Inpatient The patient requires at least 2 midnights for further evaluation and treatment of present condition.   Darlin Drop MD [facility] Pager [phone]  If 7PM-7AM, please contact night-coverage [email or web address] Password [email or web address]  05/28/2023, 12:14 AM

## 2023-05-28 NOTE — Plan of Care (Signed)
  Problem: Education: Goal: Knowledge of General Education information will improve Description: Including pain rating scale, medication(s)/side effects and non-pharmacologic comfort measures Outcome: Progressing   Problem: Clinical Measurements: Goal: Respiratory complications will improve Outcome: Progressing   

## 2023-05-28 NOTE — Consult Note (Signed)
[facility]    Date of Admission:  05/27/2023    Total days of antibiotics 2             Reason for Consult: MSSA bacteremia     Assessment: [name]    Plan:  MSSA Bacteremia - Agree with current Ancef TID - TTE today - Repeat cultures tomorrow and if clear and TTE unremarkable will need a Picc line  ? Cellulitis of LLE - Pt has LLE pain between knee and ankle x 6 days. There is a petechial rash as well that he does not believe is normal for him. There is edema of both legs, likely dependent, and the L leg is not greater than the R. DVT unlikely qiven anticoagulation and no asymmetric swelling but will monitor for that. - Possible cellulitis in context of his bacteremia, although the rash does not appear like a classic presentation. If it is cellulitis, Ancef should cover. - Will get non-vasc US of LLE  CAP - Continue zithromax  Principal Problem:   MSSA bacteremia    apixaban  5 mg Oral BID   cyanocobalamin  500 mcg Oral Daily   finasteride  5 mg Oral Daily   [START ON 05/29/2023] influenza vaccine adjuvanted  0.5 mL Intramuscular Tomorrow-1000   insulin aspart  0-5 Units Subcutaneous QHS   insulin aspart  0-9 Units Subcutaneous TID WC   metoprolol succinate  12.5 mg Oral Daily   pantoprazole  40 mg Oral Daily   pravastatin  40 mg Oral q1800   tamsulosin  0.4 mg Oral Daily    HPI: [name] is a 89 y.o. male with PHM of Afib on eliquis, CKD3b, HTN, and recent hospitalization for CAP discharged on 10/2 who returned to [facility] hospital on the same day when blood cultures were positive for MSSA. He denies feeling poorly throughout his recent illness and hospitalization. In fact, he reports feeling like his normal self without fevers, chills, dyspnea, or fatigue. On the day of his original presentation, he was found by his primary doctor to be more confused than usual.  He also reports pain in his L lower leg, including the calf and ankle. It has been present for 6 days and he denies trauma. When pointing out petechiae in the LLE, he states he is not sure if that is normal for him.   Review of Systems: Review of Systems  Constitutional:  Negative for chills, fever, malaise/fatigue and weight loss.  Respiratory:  Negative for cough, shortness of breath and wheezing.   Cardiovascular:  Negative for chest pain.  Gastrointestinal:  Negative for constipation, diarrhea, nausea and vomiting.  Genitourinary:  Negative for dysuria.  Musculoskeletal:  Positive for joint pain and myalgias. Negative for falls.       Reports new pain in L LE  Skin:  Positive for rash. Negative for itching.       Petechial rash on LLE that he does not remember  Neurological:  Negative for headaches.    Past Medical History:  Diagnosis Date   Actinic keratosis 03/13/2009   Arthritis    BENIGN PROSTATIC HYPERTROPHY, HX OF 04/07/2009   CARCINOMA, SKIN, SQUAMOUS CELL 09/11/2009   COLONIC POLYPS, HX OF 03/13/2009   Essential hypertension    GERD 03/13/2009   Hyperlipidemia    INSOMNIA, CHRONIC 09/11/2009   Persistent atrial  fibrillation (HCC) 04/07/2009   a. s/p afib ablation at [facility] x 2 in 2010.   POLYPECTOMY, HX OF 04/07/2009   Premature atrial contractions    PULMONARY NODULE 09/11/2009   PVC's (premature ventricular contractions)    Rosacea 03/13/2009   SKIN CANCER, HX OF 03/13/2009    Social History   Tobacco Use   Smoking status: Former    Current packs/day: 0.00    Average packs/day: 1 pack/day for 30.0 years (30.0 ttl pk-yrs)    Types: Cigarettes    Start date: 03/13/1958    Quit date: 03/13/1988    Years since quitting: 35.2   Smokeless tobacco: Current    Types: Chew  Substance Use Topics   Alcohol use: No   Drug use: No    Family History  Problem Relation Age of Onset   Cancer Mother    Cancer Father 61       COLON   Heart attack Father    Heart disease  Father    Heart disease Sister    CAD Sister    Heart disease Brother    CAD Brother    CAD Brother    Heart disease Brother    Allergies  Allergen Reactions   Amoxicillin-Pot Clavulanate Diarrhea and Nausea Only   Losartan Other (See Comments) and Nausea And Vomiting    Elevated creatinine levels Elevated creatinine levels    OBJECTIVE: Blood pressure (!) 139/109, pulse 76, temperature 98.1 F (36.7 C), temperature source Oral, resp. rate 18, height 6\' 2"  (1.88 m), weight 83.5 kg, SpO2 97%.  Physical Exam Constitutional:      General: He is not in acute distress.    Appearance: Normal appearance. He is not ill-appearing.  HENT:     Head: Normocephalic and atraumatic.  Cardiovascular:     Rate and Rhythm: Normal rate and regular rhythm.     Pulses: Normal pulses.     Heart sounds: Normal heart sounds.  Pulmonary:     Effort: Pulmonary effort is normal.     Breath sounds: Normal breath sounds.  Abdominal:     General: Abdomen is flat. Bowel sounds are normal.     Palpations: Abdomen is soft.  Musculoskeletal:        General: Tenderness present.     Right lower leg: Edema present.     Left lower leg: Edema present.     Comments: On L, the LLE is tender to palpation, muscular pain between the knee and ankle, as well as petechial rash at distal extremity. There is 1+ bilateral edema but not worse on the L side.  Skin:    General: Skin is warm and dry.     Findings: Rash present. No bruising or lesion.  Neurological:     General: No focal deficit present.     Mental Status: He is alert and oriented to person, place, and time.  Psychiatric:        Mood and Affect: Mood normal.        Behavior: Behavior normal.     Lab Results Lab Results  Component Value Date   WBC 5.4 05/28/2023   HGB 9.4 (L) 05/28/2023   HCT 29.8 (L) 05/28/2023   MCV 99.7 05/28/2023   PLT 136 (L) 05/28/2023    Lab Results  Component Value Date   CREATININE 1.34 (H) 05/28/2023   BUN 25 (H)  05/28/2023   NA 138 05/28/2023   K 3.7 05/28/2023   CL 107 05/28/2023   CO2  22 05/28/2023    Lab Results  Component Value Date   ALT 13 05/26/2023   AST 19 05/26/2023   ALKPHOS 53 05/26/2023   BILITOT 1.1 05/26/2023     Microbiology: Recent Results (from the past 240 hour(s))  Blood culture (routine x 2)     Status: Abnormal (Preliminary result)   Collection Time: 05/26/23 10:40 PM   Specimen: BLOOD  Result Value Ref Range Status   Specimen Description BLOOD SITE NOT SPECIFIED  Final   Special Requests   Final    BOTTLES DRAWN AEROBIC AND ANAEROBIC Blood Culture adequate volume   Culture  Setup Time   Final    GRAM POSITIVE COCCI IN CLUSTERS IN BOTH AEROBIC AND ANAEROBIC BOTTLES CRITICAL RESULT CALLED TO, READ BACK BY AND VERIFIED WITH: RN NIKKI KOHUT ON 05/27/23 @ 1715 BY DRT Performed at [facility], [address] [place], [place] [address]    Culture STAPHYLOCOCCUS AUREUS SUSCEPTIBILITIES TO FOLLOW (A)  Final   Report Status PENDING  Incomplete  Blood culture (routine x 2)     Status: Abnormal (Preliminary result)   Collection Time: 05/26/23 10:40 PM   Specimen: BLOOD  Result Value Ref Range Status   Specimen Description BLOOD SITE NOT SPECIFIED  Final   Special Requests   Final    BOTTLES DRAWN AEROBIC AND ANAEROBIC Blood Culture adequate volume   Culture  Setup Time   Final    GRAM POSITIVE COCCI IN CLUSTERS ANAEROBIC BOTTLE ONLY CRITICAL VALUE NOTED.  VALUE IS CONSISTENT WITH PREVIOUSLY REPORTED AND CALLED VALUE. Performed at [facility], [address], [place], [place] [address]    Culture STAPHYLOCOCCUS AUREUS (A)  Final   Report Status PENDING  Incomplete  Blood Culture ID Panel (Reflexed)     Status: Abnormal   Collection Time: 05/26/23 10:40 PM  Result Value Ref Range Status   Enterococcus faecalis NOT DETECTED NOT DETECTED Final   Enterococcus Faecium NOT DETECTED NOT DETECTED Final   Listeria monocytogenes NOT DETECTED NOT DETECTED Final    Staphylococcus species DETECTED (A) NOT DETECTED Final    Comment: CRITICAL RESULT CALLED TO, READ BACK BY AND VERIFIED WITH: RN NIKKI KOHUT ON 05/27/23 @ 1715 BY DRT    Staphylococcus aureus (BCID) DETECTED (A) NOT DETECTED Final    Comment: CRITICAL RESULT CALLED TO, READ BACK BY AND VERIFIED WITH: RN NIKKI KOHUT ON 05/27/23 @ 1715 BY DRT    Staphylococcus epidermidis NOT DETECTED NOT DETECTED Final   Staphylococcus lugdunensis NOT DETECTED NOT DETECTED Final   Streptococcus species NOT DETECTED NOT DETECTED Final   Streptococcus agalactiae NOT DETECTED NOT DETECTED Final   Streptococcus pneumoniae NOT DETECTED NOT DETECTED Final   Streptococcus pyogenes NOT DETECTED NOT DETECTED Final   A.calcoaceticus-baumannii NOT DETECTED NOT DETECTED Final   Bacteroides fragilis NOT DETECTED NOT DETECTED Final   Enterobacterales NOT DETECTED NOT DETECTED Final   Enterobacter cloacae complex NOT DETECTED NOT DETECTED Final   Escherichia coli NOT DETECTED NOT DETECTED Final   Klebsiella aerogenes NOT DETECTED NOT DETECTED Final   Klebsiella oxytoca NOT DETECTED NOT DETECTED Final   Klebsiella pneumoniae NOT DETECTED NOT DETECTED Final   Proteus species NOT DETECTED NOT DETECTED Final   Salmonella species NOT DETECTED NOT DETECTED Final   Serratia marcescens NOT DETECTED NOT DETECTED Final   Haemophilus influenzae NOT DETECTED NOT DETECTED Final   Neisseria meningitidis NOT DETECTED NOT DETECTED Final   Pseudomonas aeruginosa NOT DETECTED NOT DETECTED Final  Stenotrophomonas maltophilia NOT DETECTED NOT DETECTED Final   Candida albicans NOT DETECTED NOT DETECTED Final   Candida auris NOT DETECTED NOT DETECTED Final   Candida glabrata NOT DETECTED NOT DETECTED Final   Candida krusei NOT DETECTED NOT DETECTED Final   Candida parapsilosis NOT DETECTED NOT DETECTED Final   Candida tropicalis NOT DETECTED NOT DETECTED Final   Cryptococcus neoformans/gattii NOT DETECTED NOT DETECTED Final   Meth  resistant mecA/C and MREJ NOT DETECTED NOT DETECTED Final    Comment: Performed at [facility], [address] [place], [place] [address]  Blood culture (routine x 2)     Status: None (Preliminary result)   Collection Time: 05/27/23 10:20 PM   Specimen: BLOOD  Result Value Ref Range Status   Specimen Description BLOOD BLOOD RIGHT WRIST  Final   Special Requests   Final    BOTTLES DRAWN AEROBIC AND ANAEROBIC Blood Culture adequate volume   Culture   Final    NO GROWTH < 12 HOURS Performed at [facility], [address], [place], [place] [address]    Report Status PENDING  Incomplete  Blood culture (routine x 2)     Status: None (Preliminary result)   Collection Time: 05/27/23 10:20 PM   Specimen: BLOOD  Result Value Ref Range Status   Specimen Description BLOOD BLOOD LEFT WRIST  Final   Special Requests   Final    BOTTLES DRAWN AEROBIC AND ANAEROBIC Blood Culture results may not be optimal due to an inadequate volume of blood received in culture bottles   Culture   Final    NO GROWTH < 12 HOURS Performed at [facility], [address] [place], [place] [address]    Report Status PENDING  Incomplete    Katheran Talyn, [facility] for Infectious Diseases Internal Medicine Resident - [id number] Pager: [phone]  05/28/2023 1:54 PM

## 2023-05-28 NOTE — Progress Notes (Signed)
New Admission Note:   Arrival Method: Arrived from [facility] ED via stretcher Mental Orientation: Alert and oriented x3 Telemetry: Box # 14 Assessment: Completed Skin: See docflowsheet IV: Rt FA,Lt FA Pain: 0/10 Tubes: N/A Safety Measures: Safety Fall Prevention Plan has been discussed.  Admission: Completed Orientation: Patient has been oriented to the room, unit and staff.  Family: Wife at bedside  Orders have been reviewed and implemented. Will continue to monitor the patient. Call light has been placed within reach and bed alarm has been activated.   Wyndell Cardiff [employer], RN-BC Phone number: [phone]

## 2023-05-29 ENCOUNTER — Inpatient Hospital Stay (HOSPITAL_COMMUNITY): Payer: Medicare (Managed Care)

## 2023-05-29 ENCOUNTER — Other Ambulatory Visit: Payer: Self-pay

## 2023-05-29 DIAGNOSIS — R7881 Bacteremia: Secondary | ICD-10-CM | POA: Diagnosis not present

## 2023-05-29 DIAGNOSIS — B9561 Methicillin susceptible Staphylococcus aureus infection as the cause of diseases classified elsewhere: Secondary | ICD-10-CM | POA: Diagnosis not present

## 2023-05-29 LAB — RENAL FUNCTION PANEL
Albumin: 2.8 g/dL — ABNORMAL LOW (ref 3.5–5.0)
Anion gap: 9 (ref 5–15)
BUN: 20 mg/dL (ref 8–23)
CO2: 24 mmol/L (ref 22–32)
Calcium: 8.3 mg/dL — ABNORMAL LOW (ref 8.9–10.3)
Chloride: 106 mmol/L (ref 98–111)
Creatinine, Ser: 1.43 mg/dL — ABNORMAL HIGH (ref 0.61–1.24)
GFR, Estimated: 47 mL/min — ABNORMAL LOW (ref >60)
Glucose, Bld: 101 mg/dL — ABNORMAL HIGH (ref 70–99)
Phosphorus: 3.1 mg/dL (ref 2.5–4.6)
Potassium: 4.1 mmol/L (ref 3.5–5.1)
Sodium: 139 mmol/L (ref 135–145)

## 2023-05-29 LAB — CULTURE, BLOOD (ROUTINE X 2)
Special Requests: ADEQUATE
Special Requests: ADEQUATE

## 2023-05-29 LAB — CBC
HCT: 31.5 % — ABNORMAL LOW (ref 39.0–52.0)
Hemoglobin: 10.1 g/dL — ABNORMAL LOW (ref 13.0–17.0)
MCH: 32.8 pg (ref 26.0–34.0)
MCHC: 32.1 g/dL (ref 30.0–36.0)
MCV: 102.3 fL — ABNORMAL HIGH (ref 80.0–100.0)
Platelets: 142 10*3/uL — ABNORMAL LOW (ref 150–400)
RBC: 3.08 MIL/uL — ABNORMAL LOW (ref 4.22–5.81)
RDW: 17 % — ABNORMAL HIGH (ref 11.5–15.5)
WBC: 4.5 10*3/uL (ref 4.0–10.5)
nRBC: 0 % (ref 0.0–0.2)

## 2023-05-29 LAB — GLUCOSE, CAPILLARY
Glucose-Capillary: 113 mg/dL — ABNORMAL HIGH (ref 70–99)
Glucose-Capillary: 101 mg/dL — ABNORMAL HIGH (ref 70–99)
Glucose-Capillary: 125 mg/dL — ABNORMAL HIGH (ref 70–99)
Glucose-Capillary: 110 mg/dL — ABNORMAL HIGH (ref 70–99)

## 2023-05-29 LAB — MAGNESIUM: Magnesium: 2.1 mg/dL (ref 1.7–2.4)

## 2023-05-29 MED ORDER — SODIUM CHLORIDE 0.9% FLUSH
10.0000 mL | INTRAVENOUS | Status: DC | PRN
  Administered 2023-06-02: 10 mL

## 2023-05-29 MED ORDER — SODIUM CHLORIDE 0.9% FLUSH
10.0000 mL | Freq: Two times a day (BID) | INTRAVENOUS | Status: DC
  Administered 2023-05-29 – 2023-06-01 (×6): 10 mL

## 2023-05-29 MED ORDER — CHLORHEXIDINE GLUCONATE CLOTH 2 % EX PADS
6.0000 | MEDICATED_PAD | Freq: Every day | CUTANEOUS | Status: DC
  Administered 2023-05-29 – 2023-06-01 (×4): 6 via TOPICAL

## 2023-05-29 MED ORDER — FUROSEMIDE 20 MG PO TABS
20.0000 mg | ORAL_TABLET | Freq: Every day | ORAL | Status: DC
  Administered 2023-05-29 – 2023-05-30 (×2): 20 mg via ORAL
  Filled 2023-05-29 (×2): qty 1

## 2023-05-29 MED ORDER — AMLODIPINE BESYLATE 5 MG PO TABS
2.5000 mg | ORAL_TABLET | Freq: Every day | ORAL | Status: DC
  Administered 2023-05-29 – 2023-05-30 (×2): 2.5 mg via ORAL
  Filled 2023-05-29 (×2): qty 1

## 2023-05-29 NOTE — Progress Notes (Signed)
   [facility] has been requested to perform a transesophageal echocardiogram on [name] for bacteremia.  After careful review of history and examination, the risks and benefits of transesophageal echocardiogram have been explained including risks of esophageal damage, perforation (1:10,000 risk), bleeding, pharyngeal hematoma as well as other potential complications associated with conscious sedation including aspiration, arrhythmia, respiratory failure and death. Alternatives to treatment were discussed, questions were answered. Patient is willing to proceed.   Abagail Kitchens, PA-C  05/29/2023 3:12 PM

## 2023-05-29 NOTE — Progress Notes (Signed)
[facility]    Date of Admission:  05/27/2023    Total days of antibiotics 3           ID: [name] is a 89 y.o. male with MSSA bacteremia as well as possible cellulitis, LLE pain and rash  Principal Problem:   MSSA bacteremia  Subjective: PT feels very well. No fevers, chills, dyspnea, cough. Does continue to have some LLE pain but feels that it is somewhat better than yesterday. He is in good spirits but would like to go home.  Medications:   apixaban  5 mg Oral BID   cyanocobalamin  500 mcg Oral Daily   finasteride  5 mg Oral Daily   influenza vaccine adjuvanted  0.5 mL Intramuscular Tomorrow-1000   insulin aspart  0-5 Units Subcutaneous QHS   insulin aspart  0-9 Units Subcutaneous TID WC   metoprolol succinate  12.5 mg Oral Daily   pantoprazole  40 mg Oral Daily   pravastatin  40 mg Oral q1800   tamsulosin  0.4 mg Oral Daily   Objective: Vital signs in last 24 hours: Temp:  [97.8 F (36.6 C)-98.2 F (36.8 C)] 97.8 F (36.6 C) (10/04 0914) Pulse Rate:  [59-66] 63 (10/04 0914) Resp:  [18] 18 (10/04 0612) BP: (121-151)/(54-83) 141/54 (10/04 0914) SpO2:  [97 %-100 %] 99 % (10/04 0914)   General appearance: alert, cooperative, and no distress Cardio: regular rate and rhythm, S1, S2 normal, no murmur, click, rub or gallop GI: soft, non-tender; bowel sounds normal; no masses,  no organomegaly Extremities: edema Trace edema on b/l LE, roughly equivalent  Pulses: 2+ and symmetric Skin: Mild petechia on L LE  Lab Results Recent Labs    05/28/23 0533 05/29/23 0656  WBC 5.4 4.5  HGB 9.4* 10.1*  HCT 29.8* 31.5*  NA 138 139  K 3.7 4.1  CL 107 106  CO2 22 24  BUN 25* 20  CREATININE 1.34* 1.43*   Liver Panel Recent Labs    05/26/23 2143 05/29/23 0656  PROT 6.1*  --   ALBUMIN 3.9 2.8*  AST 19  --   ALT 13  --   ALKPHOS 53  --   BILITOT 1.1  --    Sedimentation Rate No results for input(s): "ESRSEDRATE" in the last 72  hours. C-Reactive Protein No results for input(s): "CRP" in the last 72 hours.  Microbiology:  Studies/Results: ECHOCARDIOGRAM COMPLETE  Result Date: 05/28/2023    ECHOCARDIOGRAM REPORT   Patient Name:   [name] Date of Exam: 05/28/2023 Medical Rec #:  [id number]        Height:       74.0 in Accession #:    [id number]       Weight:       184.0 lb Date of Birth:  [birth year]        BSA:          2.098 m Patient Age:    89 years         BP:           120/60 mmHg Patient Gender: M                HR:           70 bpm. Exam Location:  Inpatient Procedure: 2D Echo, Cardiac Doppler and Color Doppler Indications:    Bacteremia R78.81  History:        Patient has prior history of Echocardiogram  examinations, most                 recent 04/08/2019. Arrythmias:Atrial Fibrillation; Risk                 Factors:Hypertension, Dyslipidemia and Former Smoker.  Sonographer:    Dondra Prader RVT RCS Referring Phys: Dow Adolph, N IMPRESSIONS  1. Left ventricular ejection fraction, by estimation, is 55 to 60%. The left ventricle has normal function. The left ventricle has no regional wall motion abnormalities. Left ventricular diastolic parameters were normal.  2. Right ventricular systolic function is normal. The right ventricular size is normal.  3. The mitral valve is normal in structure. Trivial mitral valve regurgitation. No evidence of mitral stenosis.  4. The aortic valve is tricuspid. There is mild calcification of the aortic valve. Aortic valve regurgitation is not visualized. No aortic stenosis is present.  5. The inferior vena cava is normal in size with greater than 50% respiratory variability, suggesting right atrial pressure of 3 mmHg. FINDINGS  Left Ventricle: Left ventricular ejection fraction, by estimation, is 55 to 60%. The left ventricle has normal function. The left ventricle has no regional wall motion abnormalities. The left ventricular internal cavity size was normal in size. There is  no left  ventricular hypertrophy. Left ventricular diastolic parameters were normal. Right Ventricle: The right ventricular size is normal. No increase in right ventricular wall thickness. Right ventricular systolic function is normal. Left Atrium: Left atrial size was normal in size. Right Atrium: Right atrial size was normal in size. Pericardium: There is no evidence of pericardial effusion. Mitral Valve: The mitral valve is normal in structure. Trivial mitral valve regurgitation. No evidence of mitral valve stenosis. Tricuspid Valve: The tricuspid valve is normal in structure. Tricuspid valve regurgitation is trivial. No evidence of tricuspid stenosis. Aortic Valve: The aortic valve is tricuspid. There is mild calcification of the aortic valve. Aortic valve regurgitation is not visualized. No aortic stenosis is present. Aortic valve mean gradient measures 2.0 mmHg. Aortic valve peak gradient measures 3.6 mmHg. Aortic valve area, by VTI measures 3.31 cm. Pulmonic Valve: The pulmonic valve was normal in structure. Pulmonic valve regurgitation is trivial. No evidence of pulmonic stenosis. Aorta: The aortic root is normal in size and structure. Venous: The inferior vena cava is normal in size with greater than 50% respiratory variability, suggesting right atrial pressure of 3 mmHg. IAS/Shunts: No atrial level shunt detected by color flow Doppler.  LEFT VENTRICLE PLAX 2D LVIDd:         5.60 cm   Diastology LVIDs:         3.70 cm   LV e' medial:    7.22 cm/s LV PW:         0.90 cm   LV E/e' medial:  13.5 LV IVS:        0.90 cm   LV e' lateral:   8.84 cm/s LVOT diam:     2.30 cm   LV E/e' lateral: 11.0 LV SV:         68 LV SV Index:   32 LVOT Area:     4.15 cm  RIGHT VENTRICLE             IVC RV S prime:     11.00 cm/s  IVC diam: 2.10 cm TAPSE (M-mode): 2.2 cm LEFT ATRIUM             Index        RIGHT ATRIUM  Index LA diam:        4.00 cm 1.91 cm/m   RA Area:     10.50 cm LA Vol (A2C):   41.9 ml 19.97 ml/m  RA  Volume:   19.60 ml  9.34 ml/m LA Vol (A4C):   33.8 ml 16.11 ml/m LA Biplane Vol: 41.7 ml 19.88 ml/m  AORTIC VALVE                    PULMONIC VALVE AV Area (Vmax):    3.16 cm     PV Vmax:       1.08 m/s AV Area (Vmean):   3.08 cm     PV Peak grad:  4.7 mmHg AV Area (VTI):     3.31 cm AV Vmax:           95.40 cm/s AV Vmean:          66.600 cm/s AV VTI:            0.206 m AV Peak Grad:      3.6 mmHg AV Mean Grad:      2.0 mmHg LVOT Vmax:         72.60 cm/s LVOT Vmean:        49.300 cm/s LVOT VTI:          0.164 m LVOT/AV VTI ratio: 0.80  AORTA Ao Root diam: 3.20 cm Ao Asc diam:  3.20 cm MITRAL VALVE MV Area (PHT): 2.76 cm    SHUNTS MV Decel Time: 275 msec    Systemic VTI:  0.16 m MV E velocity: 97.20 cm/s  Systemic Diam: 2.30 cm MV A velocity: 69.40 cm/s MV E/A ratio:  1.40 Arvilla Meres MD Electronically signed by Arvilla Meres MD Signature Date/Time: 05/28/2023/5:00:09 PM    Final      Assessment/Plan: MSSA Bacteremia - Continue current Ancef TID x 4 weeks - TTE yesterday was clear, plan for TEE likely Monday - Cultures clear at 48 hours, will place Picc line   LLE tenderness ? Cellulitis of LLE - Pt has LLE pain between knee and ankle x 7 days. There is new petechia as well. There is edema of both legs, likely dependent, and the L leg is not greater than the R. DVT unlikely qiven anticoagulation and no asymmetric swelling.  - His wife at bedside notes that it may merely be due having his compression stockings on for an extended period of time. - Evaluated for possible cellulitis in context of his bacteremia, although the rash does not appear like a classic presentation. If it is cellulitis, Ancef should cover. - [place] of LLE with moderate edema and no definite abscess or mass - Will continue to monitor, no further intervention at this time  Katheran Demari, [facility] Internal Medicine Resident - PGY1 Pager: [phone]  05/29/2023, 10:38 AM

## 2023-05-29 NOTE — Evaluation (Signed)
Physical Therapy Evaluation Patient Details Name: [name] MRN: [id number] DOB: 04/05/1932 Today's Date: 05/29/2023  History of Present Illness  89 y.o. male recently hospitalized for CAP  discharged on 10/2 who returned to [facility] hospital on the same day when blood cultures were positive for MSSA. with PHM of Afib on eliquis, CKD3b, HTN.  Clinical Impression  Pt is presenting close to baseline level of functioning. Prior to hospitalization pt was ambulating short distances at Mod I with rollator. Currently pt is CGA for gait and stairs per home set up. Pt spouse is present and supportive. Due to pt current functional status, home set up and available assistance at home no recommended skilled physical therapy services at this time. Will continue to follow in acute care hospital setting in order to ensure that pt returns home with decreased risk for falls, injury and re-hospitalization.       If plan is discharge home, recommend the following: Assistance with cooking/housework;Help with stairs or ramp for entrance     Equipment Recommendations None recommended by PT     Functional Status Assessment Patient has had a recent decline in their functional status and demonstrates the ability to make significant improvements in function in a reasonable and predictable amount of time.     Precautions / Restrictions Precautions Precautions: Fall Restrictions Weight Bearing Restrictions: No      Mobility  Bed Mobility   General bed mobility comments: pt retrieved in recliner and left in recliner at end of session.    Transfers Overall transfer level: Modified independent Equipment used: Rolling walker (2 wheels) Transfers: Sit to/from Stand Sit to Stand: Modified independent (Device/Increase time)                Ambulation/Gait Ambulation/Gait assistance: Supervision Gait Distance (Feet): 150 Feet Assistive device: Rolling walker (2 wheels) Gait Pattern/deviations:  Step-through pattern, Decreased stride length Gait velocity: slightly decreased Gait velocity interpretation: 1.31 - 2.62 ft/sec, indicative of limited community ambulator   General Gait Details: initially slightly unsteady but improved. Pt has back pain that limits distance which has been going on for years  Stairs Stairs: Yes Stairs assistance: Contact guard assist Stair Management: One rail Right, Step to pattern, Forwards Number of Stairs: 5      Balance Overall balance assessment: Mild deficits observed, not formally tested   Sitting balance-Leahy Scale: Good       Standing balance-Leahy Scale: Fair         Pertinent Vitals/Pain Pain Assessment Pain Assessment: No/denies pain    Home Living Family/patient expects to be discharged to:: Private residence Living Arrangements: Spouse/significant other Available Help at Discharge: Family;Available 24 hours/day Type of Home: House Home Access: Stairs to enter Entrance Stairs-Rails: Right Entrance Stairs-Number of Steps: 5   Home Layout: One level Home Equipment: Educational psychologist (4 wheels);Cane - single point;Wheelchair - manual Additional Comments: PCA 3 days per week 9am-1pm. Spose reports a few falls in the last year but no major injuries    Prior Function Prior Level of Function : Independent/Modified Independent;History of Falls (last six months)             Mobility Comments: ind with rollator ADLs Comments: ind, needs supervision assist to get in/out of tub     Extremity/Trunk Assessment   Upper Extremity Assessment Upper Extremity Assessment: Defer to OT evaluation    Lower Extremity Assessment Lower Extremity Assessment: Overall WFL for tasks assessed    Cervical / Trunk Assessment Cervical / Trunk Assessment: Kyphotic  Communication   Communication Communication: Hearing impairment Cueing Techniques: Verbal cues;Tactile cues  Cognition Arousal: Alert Behavior During Therapy: WFL for  tasks assessed/performed Overall Cognitive Status: Difficult to assess       General Comments: Caregiver reports he is close to baseline, pt very HOH        General Comments General comments (skin integrity, edema, etc.): Pt spouse present and supportive throughout session        Assessment/Plan    PT Assessment Patient needs continued PT services  PT Problem List Decreased mobility       PT Treatment Interventions DME instruction;Therapeutic exercise;Gait training;Balance training;Stair training;Functional mobility training;Therapeutic activities;Patient/family education    PT Goals (Current goals can be found in the Care Plan section)  Acute Rehab PT Goals Patient Stated Goal: return home with spouse PT Goal Formulation: With patient Time For Goal Achievement: 06/12/23 Potential to Achieve Goals: Good    Frequency Min 1X/week        AM-PAC PT "6 Clicks" Mobility  Outcome Measure Help needed turning from your back to your side while in a flat bed without using bedrails?: None Help needed moving from lying on your back to sitting on the side of a flat bed without using bedrails?: None Help needed moving to and from a bed to a chair (including a wheelchair)?: None Help needed standing up from a chair using your arms (e.g., wheelchair or bedside chair)?: None Help needed to walk in hospital room?: A Little Help needed climbing 3-5 steps with a railing? : A Little 6 Click Score: 22    End of Session Equipment Utilized During Treatment: Gait belt Activity Tolerance: Patient tolerated treatment well Patient left: in chair;with call bell/phone within reach;with chair alarm set Nurse Communication: Mobility status PT Visit Diagnosis: Other abnormalities of gait and mobility (R26.89)    Time: 6440-3474 PT Time Calculation (min) (ACUTE ONLY): 19 min   Charges:   PT Evaluation $PT Eval Low Complexity: 1 Low   PT General Charges $$ ACUTE PT VISIT: 1 Visit          Harrel Carina, DPT, CLT  Acute Rehabilitation Services Office: [phone] (Secure chat preferred)   Claudia Desanctis 05/29/2023, 12:55 PM

## 2023-05-29 NOTE — Progress Notes (Signed)
NO GROWTH 2 DAYS Performed at [facility], [address], [place], [place] [address]    Report Status PENDING  Incomplete    Radiology Studies: [place] EKG SITE RITE  Result Date: 05/29/2023 If Site Rite image not attached, placement could not be confirmed due to current cardiac rhythm.  ECHOCARDIOGRAM COMPLETE  Result Date: 05/28/2023    ECHOCARDIOGRAM REPORT   Patient Name:   [name] Date of Exam: 05/28/2023 Medical Rec #:  [id number]        Height:       74.0 in  Accession #:    [id number]       Weight:       184.0 lb Date of Birth:  [birth year]        BSA:          2.098 m Patient Age:    89 years         BP:           120/60 mmHg Patient Gender: M                HR:           70 bpm. Exam Location:  Inpatient Procedure: 2D Echo, Cardiac Doppler and Color Doppler Indications:    Bacteremia R78.81  History:        Patient has prior history of Echocardiogram examinations, most                 recent 04/08/2019. Arrythmias:Atrial Fibrillation; Risk                 Factors:Hypertension, Dyslipidemia and Former Smoker.  Sonographer:    Dondra Prader RVT RCS Referring Phys: Dow Adolph, N IMPRESSIONS  1. Left ventricular ejection fraction, by estimation, is 55 to 60%. The left ventricle has normal function. The left ventricle has no regional wall motion abnormalities. Left ventricular diastolic parameters were normal.  2. Right ventricular systolic function is normal. The right ventricular size is normal.  3. The mitral valve is normal in structure. Trivial mitral valve regurgitation. No evidence of mitral stenosis.  4. The aortic valve is tricuspid. There is mild calcification of the aortic valve. Aortic valve regurgitation is not visualized. No aortic stenosis is present.  5. The inferior vena cava is normal in size with greater than 50% respiratory variability, suggesting right atrial pressure of 3 mmHg. FINDINGS  Left Ventricle: Left ventricular ejection fraction, by estimation, is 55 to 60%. The left ventricle has normal function. The left ventricle has no regional wall motion abnormalities. The left ventricular internal cavity size was normal in size. There is  no left ventricular hypertrophy. Left ventricular diastolic parameters were normal. Right Ventricle: The right ventricular size is normal. No increase in right ventricular wall thickness. Right ventricular systolic function is normal. Left Atrium: Left atrial size was normal in size. Right Atrium: Right atrial size was  normal in size. Pericardium: There is no evidence of pericardial effusion. Mitral Valve: The mitral valve is normal in structure. Trivial mitral valve regurgitation. No evidence of mitral valve stenosis. Tricuspid Valve: The tricuspid valve is normal in structure. Tricuspid valve regurgitation is trivial. No evidence of tricuspid stenosis. Aortic Valve: The aortic valve is tricuspid. There is mild calcification of the aortic valve. Aortic valve regurgitation is not visualized. No aortic stenosis is present. Aortic valve mean gradient measures 2.0 mmHg. Aortic valve peak gradient measures 3.6 mmHg. Aortic valve area, by VTI measures 3.31 cm. Pulmonic Valve: The  NO GROWTH 2 DAYS Performed at [facility], [address], [place], [place] [address]    Report Status PENDING  Incomplete    Radiology Studies: [place] EKG SITE RITE  Result Date: 05/29/2023 If Site Rite image not attached, placement could not be confirmed due to current cardiac rhythm.  ECHOCARDIOGRAM COMPLETE  Result Date: 05/28/2023    ECHOCARDIOGRAM REPORT   Patient Name:   [name] Date of Exam: 05/28/2023 Medical Rec #:  [id number]        Height:       74.0 in  Accession #:    [id number]       Weight:       184.0 lb Date of Birth:  [birth year]        BSA:          2.098 m Patient Age:    89 years         BP:           120/60 mmHg Patient Gender: M                HR:           70 bpm. Exam Location:  Inpatient Procedure: 2D Echo, Cardiac Doppler and Color Doppler Indications:    Bacteremia R78.81  History:        Patient has prior history of Echocardiogram examinations, most                 recent 04/08/2019. Arrythmias:Atrial Fibrillation; Risk                 Factors:Hypertension, Dyslipidemia and Former Smoker.  Sonographer:    Dondra Prader RVT RCS Referring Phys: Dow Adolph, N IMPRESSIONS  1. Left ventricular ejection fraction, by estimation, is 55 to 60%. The left ventricle has normal function. The left ventricle has no regional wall motion abnormalities. Left ventricular diastolic parameters were normal.  2. Right ventricular systolic function is normal. The right ventricular size is normal.  3. The mitral valve is normal in structure. Trivial mitral valve regurgitation. No evidence of mitral stenosis.  4. The aortic valve is tricuspid. There is mild calcification of the aortic valve. Aortic valve regurgitation is not visualized. No aortic stenosis is present.  5. The inferior vena cava is normal in size with greater than 50% respiratory variability, suggesting right atrial pressure of 3 mmHg. FINDINGS  Left Ventricle: Left ventricular ejection fraction, by estimation, is 55 to 60%. The left ventricle has normal function. The left ventricle has no regional wall motion abnormalities. The left ventricular internal cavity size was normal in size. There is  no left ventricular hypertrophy. Left ventricular diastolic parameters were normal. Right Ventricle: The right ventricular size is normal. No increase in right ventricular wall thickness. Right ventricular systolic function is normal. Left Atrium: Left atrial size was normal in size. Right Atrium: Right atrial size was  normal in size. Pericardium: There is no evidence of pericardial effusion. Mitral Valve: The mitral valve is normal in structure. Trivial mitral valve regurgitation. No evidence of mitral valve stenosis. Tricuspid Valve: The tricuspid valve is normal in structure. Tricuspid valve regurgitation is trivial. No evidence of tricuspid stenosis. Aortic Valve: The aortic valve is tricuspid. There is mild calcification of the aortic valve. Aortic valve regurgitation is not visualized. No aortic stenosis is present. Aortic valve mean gradient measures 2.0 mmHg. Aortic valve peak gradient measures 3.6 mmHg. Aortic valve area, by VTI measures 3.31 cm. Pulmonic Valve: The  PROGRESS NOTE  [name] ZOX:[id number] DOB: [birth year]   PCP: Kristian Covey, MD  Patient is from: Home.  Lives with his wife.  DOA: 05/27/2023 LOS: 2  Chief complaints Chief Complaint  Patient presents with   Abnormal Labs      Brief Narrative / Interim history: 89 year old M with PMH of paroxysmal A-fib on Eliquis, HTN, CKD-3B and recent hospitalization for CAP returning to the hospital a day after discharge due to blood culture showing MSSA bacteremia.  He was hemodynamically stable.  Labs without leukocytosis or significant finding.  Blood cultures drawn.  He was initially started on IV ceftriaxone and doxycycline, and continued on IV Ancef and azithromycin.   Repeat blood culture NGTD.   TTE without vegetation or significant finding.  ID following.  Subjective: Seen and examined earlier this morning.  No major events overnight of this morning.  No complaints.  Very eager to go home but understands the need to stay in the hospital.  Patient's wife at bedside.  Objective: Vitals:   05/28/23 1625 05/28/23 2016 05/29/23 0612 05/29/23 0914  BP: 122/83 (!) 151/72 (!) 121/54 (!) 141/54  Pulse: 66 66 (!) 59 63  Resp: 18  18   Temp: 97.8 F (36.6 C) 98 F (36.7 C) 98.2 F (36.8 C) 97.8 F (36.6 C)  TempSrc:    Oral  SpO2: 97% 98% 100% 99%  Weight:      Height:        Examination:  GENERAL: No apparent distress.  Nontoxic. HEENT: MMM.  Vision grossly intact.  Diminished hearing. NECK: Supple.  No apparent JVD.  RESP:  No IWOB.  Fair aeration bilaterally. CVS: Irregular rhythm.  Normal rate.  Heart sounds normal.  ABD/GI/GU: BS+. Abd soft, NTND.  MSK/EXT:  Moves extremities. No apparent deformity.  Trace LLE edema. SKIN: no apparent skin lesion or wound NEURO: Awake, alert and oriented appropriately.  No apparent focal neuro deficit. PSYCH: Calm. Normal affect.   Procedures:  None  Microbiology summarized: 10/1-blood culture with MSSA 10/2-blood  cultures NGTD  Assessment and plan: Principal Problem:   MSSA bacteremia  MSSA bacteremia: POA.  Pulmonary source?  Has no ulcers.  Patient appears well.  Hemodynamically stable.  No fever or leukocytosis.  Lactic acid within normal.  Repeat blood culture NGTD.  TTE without significant finding of vegetation. -ID following -Continue IV Ancef -Defer decision about TEE to ID  Community-acquired pneumonia: POA.  No significant respiratory distress. -IV Ancef as above.  Completed course for atypical coverage. -Incentive spirometry   Paroxysmal A-fib on Eliquis: Rate controlled. -Continue home Toprol-XL and Eliquis -Optimize electrolytes  Hyperglycemia: No history of diabetes.  A1c 6.1%. Recent Labs  Lab 05/28/23 1226 05/28/23 1655 05/28/23 2015 05/29/23 0733 05/29/23 1105  GLUCAP 144* 107* 118* 113* 101*  -Discontinue CBG monitoring and subcu insulin. -Change diet to heart healthy  Essential hypertension: Normotensive.  On amlodipine, Toprol XL and Lasix at home.  BP slightly elevated. -Continue decreased dose of Toprol-XL due to borderline heart rate -Resume home Lasix at reduced dose -Resume home amlodipine   Hyperlipidemia -Continue home statin   BPH -On Proscar and Flomax   GERD -Continue PPI  Physical deconditioning -PT/OT      Body mass index is 23.62 kg/m.          DVT prophylaxis:   apixaban (ELIQUIS) tablet 5 mg  Code Status: Full code Family Communication: Updated patient's wife at bedside Level of care: Telemetry Medical Status is: Inpatient Remains  NO GROWTH 2 DAYS Performed at [facility], [address], [place], [place] [address]    Report Status PENDING  Incomplete    Radiology Studies: [place] EKG SITE RITE  Result Date: 05/29/2023 If Site Rite image not attached, placement could not be confirmed due to current cardiac rhythm.  ECHOCARDIOGRAM COMPLETE  Result Date: 05/28/2023    ECHOCARDIOGRAM REPORT   Patient Name:   [name] Date of Exam: 05/28/2023 Medical Rec #:  [id number]        Height:       74.0 in  Accession #:    [id number]       Weight:       184.0 lb Date of Birth:  [birth year]        BSA:          2.098 m Patient Age:    89 years         BP:           120/60 mmHg Patient Gender: M                HR:           70 bpm. Exam Location:  Inpatient Procedure: 2D Echo, Cardiac Doppler and Color Doppler Indications:    Bacteremia R78.81  History:        Patient has prior history of Echocardiogram examinations, most                 recent 04/08/2019. Arrythmias:Atrial Fibrillation; Risk                 Factors:Hypertension, Dyslipidemia and Former Smoker.  Sonographer:    Dondra Prader RVT RCS Referring Phys: Dow Adolph, N IMPRESSIONS  1. Left ventricular ejection fraction, by estimation, is 55 to 60%. The left ventricle has normal function. The left ventricle has no regional wall motion abnormalities. Left ventricular diastolic parameters were normal.  2. Right ventricular systolic function is normal. The right ventricular size is normal.  3. The mitral valve is normal in structure. Trivial mitral valve regurgitation. No evidence of mitral stenosis.  4. The aortic valve is tricuspid. There is mild calcification of the aortic valve. Aortic valve regurgitation is not visualized. No aortic stenosis is present.  5. The inferior vena cava is normal in size with greater than 50% respiratory variability, suggesting right atrial pressure of 3 mmHg. FINDINGS  Left Ventricle: Left ventricular ejection fraction, by estimation, is 55 to 60%. The left ventricle has normal function. The left ventricle has no regional wall motion abnormalities. The left ventricular internal cavity size was normal in size. There is  no left ventricular hypertrophy. Left ventricular diastolic parameters were normal. Right Ventricle: The right ventricular size is normal. No increase in right ventricular wall thickness. Right ventricular systolic function is normal. Left Atrium: Left atrial size was normal in size. Right Atrium: Right atrial size was  normal in size. Pericardium: There is no evidence of pericardial effusion. Mitral Valve: The mitral valve is normal in structure. Trivial mitral valve regurgitation. No evidence of mitral valve stenosis. Tricuspid Valve: The tricuspid valve is normal in structure. Tricuspid valve regurgitation is trivial. No evidence of tricuspid stenosis. Aortic Valve: The aortic valve is tricuspid. There is mild calcification of the aortic valve. Aortic valve regurgitation is not visualized. No aortic stenosis is present. Aortic valve mean gradient measures 2.0 mmHg. Aortic valve peak gradient measures 3.6 mmHg. Aortic valve area, by VTI measures 3.31 cm. Pulmonic Valve: The  PROGRESS NOTE  [name] ZOX:[id number] DOB: [birth year]   PCP: Kristian Covey, MD  Patient is from: Home.  Lives with his wife.  DOA: 05/27/2023 LOS: 2  Chief complaints Chief Complaint  Patient presents with   Abnormal Labs      Brief Narrative / Interim history: 89 year old M with PMH of paroxysmal A-fib on Eliquis, HTN, CKD-3B and recent hospitalization for CAP returning to the hospital a day after discharge due to blood culture showing MSSA bacteremia.  He was hemodynamically stable.  Labs without leukocytosis or significant finding.  Blood cultures drawn.  He was initially started on IV ceftriaxone and doxycycline, and continued on IV Ancef and azithromycin.   Repeat blood culture NGTD.   TTE without vegetation or significant finding.  ID following.  Subjective: Seen and examined earlier this morning.  No major events overnight of this morning.  No complaints.  Very eager to go home but understands the need to stay in the hospital.  Patient's wife at bedside.  Objective: Vitals:   05/28/23 1625 05/28/23 2016 05/29/23 0612 05/29/23 0914  BP: 122/83 (!) 151/72 (!) 121/54 (!) 141/54  Pulse: 66 66 (!) 59 63  Resp: 18  18   Temp: 97.8 F (36.6 C) 98 F (36.7 C) 98.2 F (36.8 C) 97.8 F (36.6 C)  TempSrc:    Oral  SpO2: 97% 98% 100% 99%  Weight:      Height:        Examination:  GENERAL: No apparent distress.  Nontoxic. HEENT: MMM.  Vision grossly intact.  Diminished hearing. NECK: Supple.  No apparent JVD.  RESP:  No IWOB.  Fair aeration bilaterally. CVS: Irregular rhythm.  Normal rate.  Heart sounds normal.  ABD/GI/GU: BS+. Abd soft, NTND.  MSK/EXT:  Moves extremities. No apparent deformity.  Trace LLE edema. SKIN: no apparent skin lesion or wound NEURO: Awake, alert and oriented appropriately.  No apparent focal neuro deficit. PSYCH: Calm. Normal affect.   Procedures:  None  Microbiology summarized: 10/1-blood culture with MSSA 10/2-blood  cultures NGTD  Assessment and plan: Principal Problem:   MSSA bacteremia  MSSA bacteremia: POA.  Pulmonary source?  Has no ulcers.  Patient appears well.  Hemodynamically stable.  No fever or leukocytosis.  Lactic acid within normal.  Repeat blood culture NGTD.  TTE without significant finding of vegetation. -ID following -Continue IV Ancef -Defer decision about TEE to ID  Community-acquired pneumonia: POA.  No significant respiratory distress. -IV Ancef as above.  Completed course for atypical coverage. -Incentive spirometry   Paroxysmal A-fib on Eliquis: Rate controlled. -Continue home Toprol-XL and Eliquis -Optimize electrolytes  Hyperglycemia: No history of diabetes.  A1c 6.1%. Recent Labs  Lab 05/28/23 1226 05/28/23 1655 05/28/23 2015 05/29/23 0733 05/29/23 1105  GLUCAP 144* 107* 118* 113* 101*  -Discontinue CBG monitoring and subcu insulin. -Change diet to heart healthy  Essential hypertension: Normotensive.  On amlodipine, Toprol XL and Lasix at home.  BP slightly elevated. -Continue decreased dose of Toprol-XL due to borderline heart rate -Resume home Lasix at reduced dose -Resume home amlodipine   Hyperlipidemia -Continue home statin   BPH -On Proscar and Flomax   GERD -Continue PPI  Physical deconditioning -PT/OT      Body mass index is 23.62 kg/m.          DVT prophylaxis:   apixaban (ELIQUIS) tablet 5 mg  Code Status: Full code Family Communication: Updated patient's wife at bedside Level of care: Telemetry Medical Status is: Inpatient Remains  NO GROWTH 2 DAYS Performed at [facility], [address], [place], [place] [address]    Report Status PENDING  Incomplete    Radiology Studies: [place] EKG SITE RITE  Result Date: 05/29/2023 If Site Rite image not attached, placement could not be confirmed due to current cardiac rhythm.  ECHOCARDIOGRAM COMPLETE  Result Date: 05/28/2023    ECHOCARDIOGRAM REPORT   Patient Name:   [name] Date of Exam: 05/28/2023 Medical Rec #:  [id number]        Height:       74.0 in  Accession #:    [id number]       Weight:       184.0 lb Date of Birth:  [birth year]        BSA:          2.098 m Patient Age:    89 years         BP:           120/60 mmHg Patient Gender: M                HR:           70 bpm. Exam Location:  Inpatient Procedure: 2D Echo, Cardiac Doppler and Color Doppler Indications:    Bacteremia R78.81  History:        Patient has prior history of Echocardiogram examinations, most                 recent 04/08/2019. Arrythmias:Atrial Fibrillation; Risk                 Factors:Hypertension, Dyslipidemia and Former Smoker.  Sonographer:    Dondra Prader RVT RCS Referring Phys: Dow Adolph, N IMPRESSIONS  1. Left ventricular ejection fraction, by estimation, is 55 to 60%. The left ventricle has normal function. The left ventricle has no regional wall motion abnormalities. Left ventricular diastolic parameters were normal.  2. Right ventricular systolic function is normal. The right ventricular size is normal.  3. The mitral valve is normal in structure. Trivial mitral valve regurgitation. No evidence of mitral stenosis.  4. The aortic valve is tricuspid. There is mild calcification of the aortic valve. Aortic valve regurgitation is not visualized. No aortic stenosis is present.  5. The inferior vena cava is normal in size with greater than 50% respiratory variability, suggesting right atrial pressure of 3 mmHg. FINDINGS  Left Ventricle: Left ventricular ejection fraction, by estimation, is 55 to 60%. The left ventricle has normal function. The left ventricle has no regional wall motion abnormalities. The left ventricular internal cavity size was normal in size. There is  no left ventricular hypertrophy. Left ventricular diastolic parameters were normal. Right Ventricle: The right ventricular size is normal. No increase in right ventricular wall thickness. Right ventricular systolic function is normal. Left Atrium: Left atrial size was normal in size. Right Atrium: Right atrial size was  normal in size. Pericardium: There is no evidence of pericardial effusion. Mitral Valve: The mitral valve is normal in structure. Trivial mitral valve regurgitation. No evidence of mitral valve stenosis. Tricuspid Valve: The tricuspid valve is normal in structure. Tricuspid valve regurgitation is trivial. No evidence of tricuspid stenosis. Aortic Valve: The aortic valve is tricuspid. There is mild calcification of the aortic valve. Aortic valve regurgitation is not visualized. No aortic stenosis is present. Aortic valve mean gradient measures 2.0 mmHg. Aortic valve peak gradient measures 3.6 mmHg. Aortic valve area, by VTI measures 3.31 cm. Pulmonic Valve: The

## 2023-05-29 NOTE — TOC CM/SW Note (Addendum)
Transition of Care [facility]) - Inpatient Brief Assessment   Patient Details  Name: [name] MRN: [id number] Date of Birth: 11/03/1931  Transition of Care [facility]) CM/SW Contact:    Tom-Johnson, Hershal Coria, RN Phone Number: 05/29/2023, 4:32 PM   Clinical Narrative:  Patient presented to the ED due to positive Blood Cultures x 2, growing Staph Aureus. Patient was recently admitted on 05/26/2023 for Community Acquired Pneumonia, discharged and called back for positive Blood Cx, IV abx. PICC placed today, plan for 4 wks home IV abx. ID following. Plan for TEE Monday to r/o Endocarditis.   From home with wife, has three supportive daughters. Patient does not drive, wife transports to and from appointments. Has all necessary DME's at home.  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   PCP is Burchette, Elberta Fortis, MD and uses [facility] on [address]     [facility] consulted for home IV abx. Patient requested [facility], referral called in to [facility],  will verify if in network with patient's [employer] plan.  CM to f/u with Pam and arrange RN once IV infusion company is established.  No PT/OT f/u noted.  CM will continue to follow as patient progress with care towards discharge.   1720: Pam informed CM that [facility] is not in network with patient's insurance.  CM called in referral to [facility], their Pharmacy closes at 5pm.  CM called and left secure message for on-call to return call.     Transition of Care Asessment: Insurance and Status: Insurance coverage has been reviewed Patient has primary care physician: Yes Home environment has been reviewed: Yes Prior level of function:: Independent Prior/Current Home Services: Current home services (Aide)   Readmission risk has been reviewed: Yes Transition of care needs: transition of care needs identified, TOC will continue to follow

## 2023-05-29 NOTE — Evaluation (Signed)
Occupational Therapy Evaluation Patient Details Name: [name] MRN: [id number] DOB: 04/05/1932 Today's Date: 05/29/2023   History of Present Illness 89 y.o. male recently hospitalized for CAP  discharged on 10/2 who returned to [facility] hospital on the same day when blood cultures were positive for MSSA. with PHM of Afib on eliquis, CKD3b, HTN.   Clinical Impression   Pt admitted for above, he is very pleasant and his cognition is [facility] just very HOH. Pt completing ADLs with CGA  to Setup assist and ambulating in room for transfers with CGA. Pt does have mild unsteadiness at times but has 24/7 support from spouse and PCA. Pt would benefit from continued mobility with OT and other staff to preserve strength while in acute stay. Anticipate no follow-up OT needed.       If plan is discharge home, recommend the following: A little help with bathing/dressing/bathroom    Functional Status Assessment  Patient has had a recent decline in their functional status and/or demonstrates limited ability to make significant improvements in function in a reasonable and predictable amount of time  Equipment Recommendations  None recommended by OT    Recommendations for Other Services       Precautions / Restrictions Precautions Precautions: Fall Restrictions Weight Bearing Restrictions: No      Mobility Bed Mobility Overal bed mobility: Needs Assistance Bed Mobility: Supine to Sit     Supine to sit: Supervision     General bed mobility comments: pt left in recliner    Transfers Overall transfer level: Needs assistance Equipment used: Rolling walker (2 wheels) Transfers: Sit to/from Stand Sit to Stand: Contact guard assist           General transfer comment: cues for hand placement      Balance Overall balance assessment: Needs assistance Sitting-balance support: No upper extremity supported, Feet supported Sitting balance-Leahy Scale: Good     Standing balance support:  During functional activity, Bilateral upper extremity supported Standing balance-Leahy Scale: Fair Standing balance comment: stands at sink without UE support                           ADL either performed or assessed with clinical judgement   ADL Overall ADL's : Needs assistance/impaired Eating/Feeding: Independent;Sitting   Grooming: Wash/dry face;Oral care;Contact guard assist;Standing   Upper Body Bathing: Set up;Sitting   Lower Body Bathing: Set up;Sitting/lateral leans   Upper Body Dressing : Sitting;Set up   Lower Body Dressing: Set up;Supervision/safety;Sitting/lateral leans   Toilet Transfer: Contact guard assist;Ambulation;Rolling walker (2 wheels) Toilet Transfer Details (indicate cue type and reason): based on ambulation in room Toileting- Clothing Manipulation and Hygiene: Contact guard assist;Sit to/from stand       Functional mobility during ADLs: Contact guard assist;Rolling walker (2 wheels) General ADL Comments: room level ambulation     Vision Baseline Vision/History: 0 No visual deficits Patient Visual Report: No change from baseline Vision Assessment?: No apparent visual deficits     Perception         Praxis         Pertinent Vitals/Pain Pain Assessment Pain Assessment: No/denies pain     Extremity/Trunk Assessment Upper Extremity Assessment Upper Extremity Assessment: Overall WFL for tasks assessed   Lower Extremity Assessment Lower Extremity Assessment: Defer to PT evaluation   Cervical / Trunk Assessment Cervical / Trunk Assessment: Kyphotic   Communication Communication Communication: Hearing impairment Cueing Techniques: Verbal cues;Tactile cues   Cognition Arousal: Alert  Behavior During Therapy: WFL for tasks assessed/performed Overall Cognitive Status: Difficult to assess                                 General Comments: Caregiver reports he is close to baseline, pt very HOH     General  Comments  VSS on RA    Exercises     Shoulder Instructions      Home Living Family/patient expects to be discharged to:: Private residence Living Arrangements: Spouse/significant other Available Help at Discharge: Family;Available 24 hours/day Type of Home: House Home Access: Stairs to enter [employer] of Steps: 5 Entrance Stairs-Rails: Right Home Layout: One level     Bathroom Shower/Tub: Producer, television/film/video: Standard     Home Equipment: Educational psychologist (4 wheels);Cane - single point;Wheelchair - manual   Additional Comments: PCA 3 days per week 9am-1pm. Spose reports a few falls in the last year but no major injuries      Prior Functioning/Environment Prior Level of Function : Independent/Modified Independent;History of Falls (last six months)             Mobility Comments: ind with rollator ADLs Comments: ind, needs supervision assist to get in/out of tub        OT Problem List: Impaired balance (sitting and/or standing)      OT Treatment/Interventions: Therapeutic exercise;Therapeutic activities;Self-care/ADL training;Balance training;Patient/family education    OT Goals(Current goals can be found in the care plan section) Acute Rehab OT Goals Patient Stated Goal: To go home OT Goal Formulation: With patient/family Time For Goal Achievement: 06/12/23 Potential to Achieve Goals: Good ADL Goals Pt Will Perform Grooming: with supervision;standing Pt Will Perform Lower Body Bathing: with modified independence;sitting/lateral leans Pt Will Transfer to Toilet: with supervision;ambulating Pt Will Perform Toileting - Clothing Manipulation and hygiene: with supervision;sit to/from stand  OT Frequency: Min 1X/week    Co-evaluation              AM-PAC OT "6 Clicks" Daily Activity     Outcome Measure Help from another person eating meals?: None Help from another person taking care of personal grooming?: A Little Help from  another person toileting, which includes using toliet, bedpan, or urinal?: A Little Help from another person bathing (including washing, rinsing, drying)?: A Little Help from another person to put on and taking off regular upper body clothing?: A Little Help from another person to put on and taking off regular lower body clothing?: A Little 6 Click Score: 19   End of Session Equipment Utilized During Treatment: Gait belt;Rolling walker (2 wheels) Nurse Communication: Mobility status  Activity Tolerance: Patient tolerated treatment well Patient left: in chair;with call bell/phone within reach;with chair alarm set;with family/visitor present  OT Visit Diagnosis: Unsteadiness on feet (R26.81)                Time: 5284-1324 OT Time Calculation (min): 28 min Charges:  OT General Charges $OT Visit: 1 Visit OT Evaluation $OT Eval Low Complexity: 1 Low OT Treatments $Self Care/Home Management : 8-22 mins  05/29/2023  AB, OTR/L  Acute Rehabilitation Services  Office: [phone]   Tristan Schroeder 05/29/2023, 10:03 AM

## 2023-05-29 NOTE — Progress Notes (Signed)
Peripherally Inserted Central Catheter Placement  The IV Nurse has discussed with the patient and/or persons authorized to consent for the patient, the purpose of this procedure and the potential benefits and risks involved with this procedure.  The benefits include less needle sticks, lab draws from the catheter, and the patient may be discharged home with the catheter. Risks include, but not limited to, infection, bleeding, blood clot (thrombus formation), and puncture of an artery; nerve damage and irregular heartbeat and possibility to perform a PICC exchange if needed/ordered by physician.  Alternatives to this procedure were also discussed.  Bard Power PICC patient education guide, fact sheet on infection prevention and patient information card has been provided to patient /or left at bedside.    PICC Placement Documentation  PICC Single Lumen 05/29/23 Right Basilic 41 cm 0 cm (Active)  Indication for Insertion or Continuance of Line Home intravenous therapies (PICC only) 05/29/23 1610  Exposed Catheter (cm) 0 cm 05/29/23 1610  Site Assessment Clean, Dry, Intact 05/29/23 1610  Line Status Flushed;Blood return noted;Saline locked 05/29/23 1610  Dressing Type Transparent 05/29/23 1610  Dressing Status Antimicrobial disc in place 05/29/23 1610  Line Care Connections checked and tightened 05/29/23 1610  Line Adjustment (NICU/IV Team Only) No 05/29/23 1610  Dressing Intervention New dressing 05/29/23 1610  Dressing Change Due 06/05/23 05/29/23 1610       Ariel Dimitri, Tamaha Ramos 05/29/2023, 4:12 PM

## 2023-05-30 ENCOUNTER — Telehealth (HOSPITAL_BASED_OUTPATIENT_CLINIC_OR_DEPARTMENT_OTHER): Payer: Self-pay

## 2023-05-30 DIAGNOSIS — R7881 Bacteremia: Secondary | ICD-10-CM | POA: Diagnosis not present

## 2023-05-30 DIAGNOSIS — B9561 Methicillin susceptible Staphylococcus aureus infection as the cause of diseases classified elsewhere: Secondary | ICD-10-CM | POA: Diagnosis not present

## 2023-05-30 LAB — CBC
HCT: 32.5 % — ABNORMAL LOW (ref 39.0–52.0)
Hemoglobin: 10.3 g/dL — ABNORMAL LOW (ref 13.0–17.0)
MCH: 32.2 pg (ref 26.0–34.0)
MCHC: 31.7 g/dL (ref 30.0–36.0)
MCV: 101.6 fL — ABNORMAL HIGH (ref 80.0–100.0)
Platelets: 150 10*3/uL (ref 150–400)
RBC: 3.2 MIL/uL — ABNORMAL LOW (ref 4.22–5.81)
RDW: 16.8 % — ABNORMAL HIGH (ref 11.5–15.5)
WBC: 5.5 10*3/uL (ref 4.0–10.5)
nRBC: 0 % (ref 0.0–0.2)

## 2023-05-30 LAB — RENAL FUNCTION PANEL
Albumin: 2.9 g/dL — ABNORMAL LOW (ref 3.5–5.0)
Anion gap: 7 (ref 5–15)
BUN: 22 mg/dL (ref 8–23)
CO2: 28 mmol/L (ref 22–32)
Calcium: 8.5 mg/dL — ABNORMAL LOW (ref 8.9–10.3)
Chloride: 103 mmol/L (ref 98–111)
Creatinine, Ser: 1.54 mg/dL — ABNORMAL HIGH (ref 0.61–1.24)
GFR, Estimated: 43 mL/min — ABNORMAL LOW (ref >60)
Glucose, Bld: 103 mg/dL — ABNORMAL HIGH (ref 70–99)
Phosphorus: 2.9 mg/dL (ref 2.5–4.6)
Potassium: 4 mmol/L (ref 3.5–5.1)
Sodium: 138 mmol/L (ref 135–145)

## 2023-05-30 LAB — MAGNESIUM: Magnesium: 2 mg/dL (ref 1.7–2.4)

## 2023-05-30 LAB — GLUCOSE, CAPILLARY
Glucose-Capillary: 101 mg/dL — ABNORMAL HIGH (ref 70–99)
Glucose-Capillary: 88 mg/dL (ref 70–99)
Glucose-Capillary: 105 mg/dL — ABNORMAL HIGH (ref 70–99)

## 2023-05-30 MED ORDER — SENNOSIDES-DOCUSATE SODIUM 8.6-50 MG PO TABS: 2.0000 | ORAL_TABLET | Freq: Every day | ORAL | Status: DC | PRN

## 2023-05-30 MED ORDER — SENNOSIDES-DOCUSATE SODIUM 8.6-50 MG PO TABS
2.0000 | ORAL_TABLET | Freq: Once | ORAL | Status: AC
  Administered 2023-05-30: 2 via ORAL
  Filled 2023-05-30: qty 2

## 2023-05-30 MED ORDER — POLYETHYLENE GLYCOL 3350 17 G PO PACK: 17.0000 g | PACK | Freq: Two times a day (BID) | ORAL | Status: DC | PRN

## 2023-05-30 MED ORDER — POLYETHYLENE GLYCOL 3350 17 G PO PACK
17.0000 g | PACK | Freq: Once | ORAL | Status: AC
  Administered 2023-05-30: 17 g via ORAL
  Filled 2023-05-30: qty 1

## 2023-05-30 NOTE — Progress Notes (Signed)
PROGRESS NOTE  [name]:[id number] DOB: 04-Nov-1931   PCP: Kristian Covey, MD  Patient is from: Home.  Lives with his wife.  DOA: 05/27/2023 LOS: 3  Chief complaints Chief Complaint  Patient presents with   Abnormal Labs      Brief Narrative / Interim history: 89 year old M with PMH of paroxysmal A-fib on Eliquis, HTN, CKD-3B and recent hospitalization for CAP returning to the hospital a day after discharge due to blood culture showing MSSA bacteremia.  He was hemodynamically stable.  Labs without leukocytosis or significant finding.  Blood cultures drawn.  He was initially started on IV ceftriaxone and doxycycline, and continued on IV Ancef and azithromycin.   Repeat blood culture NGTD.   TTE without vegetation or significant finding.  TEE planned for 10/8.  ID following.  Subjective: Seen and examined earlier this morning.  No major events overnight of this morning.  Reports some urinary hesitancy and constipation today.  Denies dysuria, abdominal pain, nausea or vomiting.  Denies cardiopulmonary symptoms.  Objective: Vitals:   05/29/23 1737 05/29/23 2122 05/30/23 0544 05/30/23 0951  BP: (!) 144/50 (!) 133/50 (!) 147/70 138/68  Pulse: 81 62 65 65  Resp: 16 16 18    Temp: 98.1 F (36.7 C) 98.3 F (36.8 C) 98.6 F (37 C) 98 F (36.7 C)  TempSrc:  Oral Oral Oral  SpO2: 100% 98% 97% 98%  Weight:      Height:        Examination:  GENERAL: No apparent distress.  Nontoxic. HEENT: MMM.  Vision grossly intact.  Diminished hearing. NECK: Supple.  No apparent JVD.  RESP:  No IWOB.  Fair aeration bilaterally. CVS: Irregular rhythm.  Normal rate.  Heart sounds normal.  ABD/GI/GU: BS+. Abd soft, NTND.  MSK/EXT:  Moves extremities. No apparent deformity.  Trace LLE edema. SKIN: no apparent skin lesion or wound NEURO: Awake, alert and oriented appropriately.  No apparent focal neuro deficit. PSYCH: Calm. Normal affect.   Procedures:  None  Microbiology  summarized: 10/1-blood culture with MSSA 10/2-blood cultures NGTD  Assessment and plan: Principal Problem:   MSSA bacteremia  MSSA bacteremia: POA.  Pulmonary source?  Has no ulcers.  Patient appears well.  Hemodynamically stable.  No fever or leukocytosis.  Lactic acid within normal.  Repeat blood culture NGTD.  TTE without significant finding of vegetation. -Appreciate ID recs-Ancef and TEE -TEE planned for 10/8 per ID -PICC line placed on 10/4  Community-acquired pneumonia: POA.  No significant respiratory distress. -IV Ancef as above.  Completed course for atypical coverage. -Incentive spirometry   Paroxysmal A-fib on Eliquis: Rate controlled. -Continue home Toprol-XL and Eliquis -Optimize electrolytes  Hyperglycemia: No history of diabetes.  A1c 6.1%. Recent Labs  Lab 05/29/23 0733 05/29/23 1105 05/29/23 1659 05/29/23 2125 05/30/23 0734  GLUCAP 113* 101* 125* 110* 101*  -Discontinue CBG monitoring and subcu insulin. -Change diet to heart healthy  Essential hypertension: Normotensive.  On amlodipine, Toprol XL and Lasix at home.  -Continue decreased dose of Toprol-XL due to borderline heart rate -Continue reduced dose of Lasix -Continue home amlodipine   Hyperlipidemia -Continue home statin   BPH with urinary hesitancy -Continue Proscar and Flomax -Bladder scan  Constipation -Bowel regimen   GERD -Continue PPI  Physical deconditioning -PT/OT      Body mass index is 23.62 kg/m.          DVT prophylaxis:   apixaban (ELIQUIS) tablet 5 mg  Code Status: Full code Family Communication: None at bedside  today. Level of care: Telemetry Medical Status is: Inpatient Remains inpatient appropriate because: MSSA bacteremia   Final disposition: Home Consultants:  Infectious disease Cardiology for TEE  35 minutes with more than 50% spent in reviewing records, counseling patient/family and coordinating care.   Sch Meds:  Scheduled Meds:   amLODipine  2.5 mg Oral Daily   apixaban  5 mg Oral BID   Chlorhexidine Gluconate Cloth  6 each Topical Daily   cyanocobalamin  500 mcg Oral Daily   finasteride  5 mg Oral Daily   furosemide  20 mg Oral Daily   influenza vaccine adjuvanted  0.5 mL Intramuscular Tomorrow-1000   metoprolol succinate  12.5 mg Oral Daily   pantoprazole  40 mg Oral Daily   pravastatin  40 mg Oral q1800   sodium chloride flush  10-40 mL Intracatheter Q12H   tamsulosin  0.4 mg Oral Daily   Continuous Infusions:   ceFAZolin (ANCEF) IV 2 g (05/30/23 0546)   PRN Meds:.acetaminophen, melatonin, [COMPLETED] polyethylene glycol **FOLLOWED BY** polyethylene glycol, prochlorperazine, [COMPLETED] senna-docusate **FOLLOWED BY** senna-docusate, sodium chloride flush  Antimicrobials: Anti-infectives (From admission, onward)    Start     Dose/Rate Route Frequency Ordered Stop   05/28/23 1000  cefTRIAXone (ROCEPHIN) 2 g in sodium chloride 0.9 % 100 mL IVPB  Status:  Discontinued        2 g 200 mL/hr over 30 Minutes Intravenous Every 24 hours 05/28/23 0022 05/28/23 0026   05/28/23 0115  ceFAZolin (ANCEF) IVPB 2g/100 mL premix        2 g 200 mL/hr over 30 Minutes Intravenous Every 8 hours 05/28/23 0026     05/28/23 0100  azithromycin (ZITHROMAX) 500 mg in sodium chloride 0.9 % 250 mL IVPB  Status:  Discontinued        500 mg 250 mL/hr over 60 Minutes Intravenous Daily at bedtime 05/28/23 0022 05/28/23 1052   05/27/23 2200  cefTRIAXone (ROCEPHIN) 1 g in sodium chloride 0.9 % 100 mL IVPB        1 g 200 mL/hr over 30 Minutes Intravenous  Once 05/27/23 2145 05/27/23 2326   05/27/23 2200  doxycycline (VIBRA-TABS) tablet 100 mg        100 mg Oral  Once 05/27/23 2145 05/27/23 2241        I have personally reviewed the following labs and images: CBC: Recent Labs  Lab 05/26/23 2143 05/27/23 0246 05/27/23 2215 05/28/23 0533 05/29/23 0656 05/30/23 0329  WBC 16.3* 11.8* 7.3 5.4 4.5 5.5  NEUTROABS 13.5*  --  5.1  --    --   --   HGB 12.1* 10.5* 10.9* 9.4* 10.1* 10.3*  HCT 38.0* 33.4* 34.0* 29.8* 31.5* 32.5*  MCV 99.5 100.3* 100.0 99.7 102.3* 101.6*  PLT 191 160 167 136* 142* 150   BMP &GFR Recent Labs  Lab 05/27/23 0246 05/27/23 2215 05/28/23 0533 05/29/23 0656 05/30/23 0329  NA 137 138 138 139 138  K 4.2 4.1 3.7 4.1 4.0  CL 104 101 107 106 103  CO2 23 23 22 24 28   GLUCOSE 141* 144* 101* 101* 103*  BUN 23 28* 25* 20 22  CREATININE 1.43* 1.43* 1.34* 1.43* 1.54*  CALCIUM 8.4* 8.8* 8.0* 8.3* 8.5*  MG  --   --  1.9 2.1 2.0  PHOS  --   --  2.5 3.1 2.9   Estimated Creatinine Clearance: 37.8 mL/min (A) (by C-G formula based on SCr of 1.54 mg/dL (H)). Liver & Pancreas: Recent Labs  Lab  05/26/23 2143 05/29/23 0656 05/30/23 0329  AST 19  --   --   ALT 13  --   --   ALKPHOS 53  --   --   BILITOT 1.1  --   --   PROT 6.1*  --   --   ALBUMIN 3.9 2.8* 2.9*   No results for input(s): "LIPASE", "AMYLASE" in the last 168 hours. No results for input(s): "AMMONIA" in the last 168 hours. Diabetic: Recent Labs    05/28/23 0533  HGBA1C 6.1*   Recent Labs  Lab 05/29/23 0733 05/29/23 1105 05/29/23 1659 05/29/23 2125 05/30/23 0734  GLUCAP 113* 101* 125* 110* 101*   Cardiac Enzymes: No results for input(s): "CKTOTAL", "CKMB", "CKMBINDEX", "TROPONINI" in the last 168 hours. No results for input(s): "PROBNP" in the last 8760 hours. Coagulation Profile: No results for input(s): "INR", "PROTIME" in the last 168 hours. Thyroid Function Tests: No results for input(s): "TSH", "T4TOTAL", "FREET4", "T3FREE", "THYROIDAB" in the last 72 hours. Lipid Profile: No results for input(s): "CHOL", "HDL", "LDLCALC", "TRIG", "CHOLHDL", "LDLDIRECT" in the last 72 hours. Anemia Panel: No results for input(s): "VITAMINB12", "FOLATE", "FERRITIN", "TIBC", "IRON", "RETICCTPCT" in the last 72 hours. Urine analysis:    Component Value Date/Time   COLORURINE YELLOW 05/26/2023 2121   APPEARANCEUR CLEAR 05/26/2023  2121   LABSPEC 1.017 05/26/2023 2121   PHURINE 6.0 05/26/2023 2121   GLUCOSEU NEGATIVE 05/26/2023 2121   HGBUR NEGATIVE 05/26/2023 2121   BILIRUBINUR NEGATIVE 05/26/2023 2121   BILIRUBINUR Negative 04/29/2023 1342   KETONESUR NEGATIVE 05/26/2023 2121   PROTEINUR NEGATIVE 05/26/2023 2121   UROBILINOGEN 0.2 04/29/2023 1342   UROBILINOGEN 1 02/23/2014 1404   NITRITE NEGATIVE 05/26/2023 2121   LEUKOCYTESUR NEGATIVE 05/26/2023 2121   Sepsis Labs: Invalid input(s): "PROCALCITONIN", "LACTICIDVEN"  Microbiology: Recent Results (from the past 240 hour(s))  Blood culture (routine x 2)     Status: Abnormal   Collection Time: 05/26/23 10:40 PM   Specimen: BLOOD  Result Value Ref Range Status   Specimen Description BLOOD SITE NOT SPECIFIED  Final   Special Requests   Final    BOTTLES DRAWN AEROBIC AND ANAEROBIC Blood Culture adequate volume   Culture  Setup Time   Final    GRAM POSITIVE COCCI IN CLUSTERS IN BOTH AEROBIC AND ANAEROBIC BOTTLES CRITICAL RESULT CALLED TO, READ BACK BY AND VERIFIED WITH: RN NIKKI KOHUT ON 05/27/23 @ 1715 BY DRT Performed at [facility], [address], [place], [place] [address]    Culture STAPHYLOCOCCUS AUREUS (A)  Final   Report Status 05/29/2023 FINAL  Final   Organism ID, Bacteria STAPHYLOCOCCUS AUREUS  Final      Susceptibility   Staphylococcus aureus - MIC*    CIPROFLOXACIN <=0.5 SENSITIVE Sensitive     ERYTHROMYCIN <=0.25 SENSITIVE Sensitive     GENTAMICIN <=0.5 SENSITIVE Sensitive     OXACILLIN <=0.25 SENSITIVE Sensitive     TETRACYCLINE <=1 SENSITIVE Sensitive     VANCOMYCIN 1 SENSITIVE Sensitive     TRIMETH/SULFA <=10 SENSITIVE Sensitive     CLINDAMYCIN <=0.25 SENSITIVE Sensitive     RIFAMPIN <=0.5 SENSITIVE Sensitive     Inducible Clindamycin NEGATIVE Sensitive     LINEZOLID 2 SENSITIVE Sensitive     * STAPHYLOCOCCUS AUREUS  Blood culture (routine x 2)     Status: Abnormal   Collection Time: 05/26/23 10:40 PM   Specimen: BLOOD   Result Value Ref Range Status   Specimen Description BLOOD SITE NOT SPECIFIED  Final  Special Requests   Final    BOTTLES DRAWN AEROBIC AND ANAEROBIC Blood Culture adequate volume   Culture  Setup Time   Final    GRAM POSITIVE COCCI IN CLUSTERS ANAEROBIC BOTTLE ONLY CRITICAL VALUE NOTED.  VALUE IS CONSISTENT WITH PREVIOUSLY REPORTED AND CALLED VALUE.    Culture (A)  Final    STAPHYLOCOCCUS AUREUS SUSCEPTIBILITIES PERFORMED ON PREVIOUS CULTURE WITHIN THE LAST 5 DAYS. Performed at [facility], [address], [place], [place] [address]    Report Status 05/29/2023 FINAL  Final  Blood Culture ID Panel (Reflexed)     Status: Abnormal   Collection Time: 05/26/23 10:40 PM  Result Value Ref Range Status   Enterococcus faecalis NOT DETECTED NOT DETECTED Final   Enterococcus Faecium NOT DETECTED NOT DETECTED Final   Listeria monocytogenes NOT DETECTED NOT DETECTED Final   Staphylococcus species DETECTED (A) NOT DETECTED Final    Comment: CRITICAL RESULT CALLED TO, READ BACK BY AND VERIFIED WITH: RN NIKKI KOHUT ON 05/27/23 @ 1715 BY DRT    Staphylococcus aureus (BCID) DETECTED (A) NOT DETECTED Final    Comment: CRITICAL RESULT CALLED TO, READ BACK BY AND VERIFIED WITH: RN NIKKI KOHUT ON 05/27/23 @ 1715 BY DRT    Staphylococcus epidermidis NOT DETECTED NOT DETECTED Final   Staphylococcus lugdunensis NOT DETECTED NOT DETECTED Final   Streptococcus species NOT DETECTED NOT DETECTED Final   Streptococcus agalactiae NOT DETECTED NOT DETECTED Final   Streptococcus pneumoniae NOT DETECTED NOT DETECTED Final   Streptococcus pyogenes NOT DETECTED NOT DETECTED Final   A.calcoaceticus-baumannii NOT DETECTED NOT DETECTED Final   Bacteroides fragilis NOT DETECTED NOT DETECTED Final   Enterobacterales NOT DETECTED NOT DETECTED Final   Enterobacter cloacae complex NOT DETECTED NOT DETECTED Final   Escherichia coli NOT DETECTED NOT DETECTED Final   Klebsiella aerogenes NOT DETECTED NOT DETECTED  Final   Klebsiella oxytoca NOT DETECTED NOT DETECTED Final   Klebsiella pneumoniae NOT DETECTED NOT DETECTED Final   Proteus species NOT DETECTED NOT DETECTED Final   Salmonella species NOT DETECTED NOT DETECTED Final   Serratia marcescens NOT DETECTED NOT DETECTED Final   Haemophilus influenzae NOT DETECTED NOT DETECTED Final   Neisseria meningitidis NOT DETECTED NOT DETECTED Final   Pseudomonas aeruginosa NOT DETECTED NOT DETECTED Final   Stenotrophomonas maltophilia NOT DETECTED NOT DETECTED Final   Candida albicans NOT DETECTED NOT DETECTED Final   Candida auris NOT DETECTED NOT DETECTED Final   Candida glabrata NOT DETECTED NOT DETECTED Final   Candida krusei NOT DETECTED NOT DETECTED Final   Candida parapsilosis NOT DETECTED NOT DETECTED Final   Candida tropicalis NOT DETECTED NOT DETECTED Final   Cryptococcus neoformans/gattii NOT DETECTED NOT DETECTED Final   Meth resistant mecA/C and MREJ NOT DETECTED NOT DETECTED Final    Comment: Performed at [facility], [address] [place], [place] [address]  Blood culture (routine x 2)     Status: None (Preliminary result)   Collection Time: 05/27/23 10:20 PM   Specimen: BLOOD  Result Value Ref Range Status   Specimen Description BLOOD BLOOD RIGHT WRIST  Final   Special Requests   Final    BOTTLES DRAWN AEROBIC AND ANAEROBIC Blood Culture adequate volume   Culture   Final    NO GROWTH 3 DAYS Performed at [facility], [address], [place], [place] [address]    Report Status PENDING  Incomplete  Blood culture (routine x 2)     Status: None (  Preliminary result)   Collection Time: 05/27/23 10:20 PM   Specimen: BLOOD  Result Value Ref Range Status   Specimen Description BLOOD BLOOD LEFT WRIST  Final   Special Requests   Final    BOTTLES DRAWN AEROBIC AND ANAEROBIC Blood Culture results may not be optimal due to an inadequate volume of blood received in culture bottles   Culture   Final    NO GROWTH 3  DAYS Performed at [facility], [address], [place], [place] [address]    Report Status PENDING  Incomplete    Radiology Studies: [place] LT LOWER EXTREM LTD SOFT TISSUE NON VASCULAR  Result Date: 05/29/2023 CLINICAL DATA:  Cellulitis. EXAM: ULTRASOUND left LOWER EXTREMITY LIMITED TECHNIQUE: Ultrasound examination of the lower extremity soft tissues was performed in the area of clinical concern. COMPARISON:  None Available. FINDINGS: Limited sonographic evaluation was performed in the area above the left ankle. No definite sonographic evidence of soft tissue mass or well defined fluid collection is noted. There is noted a moderate amount of probable edema within the soft tissues. IMPRESSION: Moderate amount of edema is noted within the soft tissues. No definite abscess or mass is noted. Electronically Signed   By: Lupita Raider M.D.   On: 05/29/2023 14:50   [place] EKG SITE RITE  Result Date: 05/29/2023 If Site Rite image not attached, placement could not be confirmed due to current cardiac rhythm.     Laster Appling T. Nicosha Struve [facility]  If 7PM-7AM, please contact night-coverage [email or web address] 05/30/2023, 10:07 AM

## 2023-05-31 DIAGNOSIS — B9561 Methicillin susceptible Staphylococcus aureus infection as the cause of diseases classified elsewhere: Secondary | ICD-10-CM | POA: Diagnosis not present

## 2023-05-31 DIAGNOSIS — R7881 Bacteremia: Secondary | ICD-10-CM | POA: Diagnosis not present

## 2023-05-31 LAB — RENAL FUNCTION PANEL
Albumin: 2.8 g/dL — ABNORMAL LOW (ref 3.5–5.0)
Anion gap: 10 (ref 5–15)
BUN: 21 mg/dL (ref 8–23)
CO2: 27 mmol/L (ref 22–32)
Calcium: 8.4 mg/dL — ABNORMAL LOW (ref 8.9–10.3)
Chloride: 102 mmol/L (ref 98–111)
Creatinine, Ser: 1.58 mg/dL — ABNORMAL HIGH (ref 0.61–1.24)
GFR, Estimated: 42 mL/min — ABNORMAL LOW (ref >60)
Glucose, Bld: 111 mg/dL — ABNORMAL HIGH (ref 70–99)
Phosphorus: 3.7 mg/dL (ref 2.5–4.6)
Potassium: 3.8 mmol/L (ref 3.5–5.1)
Sodium: 139 mmol/L (ref 135–145)

## 2023-05-31 LAB — CBC
HCT: 30.8 % — ABNORMAL LOW (ref 39.0–52.0)
Hemoglobin: 9.9 g/dL — ABNORMAL LOW (ref 13.0–17.0)
MCH: 32 pg (ref 26.0–34.0)
MCHC: 32.1 g/dL (ref 30.0–36.0)
MCV: 99.7 fL (ref 80.0–100.0)
Platelets: 155 10*3/uL (ref 150–400)
RBC: 3.09 MIL/uL — ABNORMAL LOW (ref 4.22–5.81)
RDW: 16.7 % — ABNORMAL HIGH (ref 11.5–15.5)
WBC: 4.8 10*3/uL (ref 4.0–10.5)
nRBC: 0 % (ref 0.0–0.2)

## 2023-05-31 LAB — MAGNESIUM: Magnesium: 2 mg/dL (ref 1.7–2.4)

## 2023-05-31 LAB — GLUCOSE, CAPILLARY
Glucose-Capillary: 112 mg/dL — ABNORMAL HIGH (ref 70–99)
Glucose-Capillary: 130 mg/dL — ABNORMAL HIGH (ref 70–99)
Glucose-Capillary: 122 mg/dL — ABNORMAL HIGH (ref 70–99)

## 2023-05-31 MED ORDER — ORAL CARE MOUTH RINSE: 15.0000 mL | OROMUCOSAL | Status: DC | PRN

## 2023-05-31 MED ORDER — AMLODIPINE BESYLATE 5 MG PO TABS
5.0000 mg | ORAL_TABLET | Freq: Every day | ORAL | Status: DC
  Administered 2023-05-31 – 2023-06-02 (×3): 5 mg via ORAL
  Filled 2023-05-31 (×3): qty 1

## 2023-05-31 MED ORDER — GABAPENTIN 100 MG PO CAPS
100.0000 mg | ORAL_CAPSULE | Freq: Two times a day (BID) | ORAL | Status: DC
  Administered 2023-05-31 – 2023-06-02 (×5): 100 mg via ORAL
  Filled 2023-05-31 (×5): qty 1

## 2023-05-31 MED ORDER — HYDRALAZINE HCL 25 MG PO TABS: 25.0000 mg | ORAL_TABLET | Freq: Four times a day (QID) | ORAL | Status: DC | PRN

## 2023-05-31 MED ORDER — FUROSEMIDE 40 MG PO TABS
40.0000 mg | ORAL_TABLET | Freq: Every day | ORAL | Status: DC
  Administered 2023-05-31: 40 mg via ORAL
  Filled 2023-05-31 (×3): qty 1

## 2023-05-31 MED ORDER — ACETAMINOPHEN 500 MG PO TABS
1000.0000 mg | ORAL_TABLET | Freq: Three times a day (TID) | ORAL | Status: DC
  Administered 2023-05-31 – 2023-06-02 (×6): 1000 mg via ORAL
  Filled 2023-05-31 (×6): qty 2

## 2023-05-31 NOTE — Progress Notes (Signed)
PROGRESS NOTE  [name] GNF:[id number] DOB: [birth year]   PCP: Kristian Covey, MD  Patient is from: Home.  Lives with his wife.  DOA: 05/27/2023 LOS: 4  Chief complaints Chief Complaint  Patient presents with   Abnormal Labs      Brief Narrative / Interim history: 89 year old M with PMH of paroxysmal A-fib on Eliquis, HTN, CKD-3B and recent hospitalization for CAP returning to the hospital a day after discharge due to blood culture showing MSSA bacteremia.  He was hemodynamically stable.  Labs without leukocytosis or significant finding.  Blood cultures drawn.  He was initially started on IV ceftriaxone and doxycycline, and continued on IV Ancef and azithromycin.   Repeat blood culture NGTD.   TTE without vegetation or significant finding.  PICC line placed on 10/4.  TEE planned for 10/7.  Likely discharge on 10/7 after TEE  Subjective: Seen and examined earlier this morning.  No major events overnight of this morning.  No complaints other than chronic back pain.  Denies new change.  Had bowel movements.  Patient's wife at bedside.  Objective: Vitals:   05/30/23 0951 05/30/23 1645 05/31/23 0616 05/31/23 0944  BP: 138/68 (!) 159/67 (!) 176/73 (!) 150/64  Pulse: 65 74 77 73  Resp:    (!) 8  Temp: 98 F (36.7 C) 98.4 F (36.9 C) 97.6 F (36.4 C) 97.8 F (36.6 C)  TempSrc: Oral Oral    SpO2: 98% 97% 100% 100%  Weight:      Height:        Examination:  GENERAL: No apparent distress.  Nontoxic. HEENT: MMM.  Vision grossly intact.  Diminished hearing. NECK: Supple.  No apparent JVD.  RESP:  No IWOB.  Fair aeration bilaterally. CVS: Irregular rhythm.  Normal rate.  Heart sounds normal.  ABD/GI/GU: BS+. Abd soft, NTND.  MSK/EXT:  Moves extremities. No apparent deformity.  Trace LLE edema. SKIN: no apparent skin lesion or wound NEURO: Awake, alert and oriented appropriately.  No apparent focal neuro deficit. PSYCH: Calm. Normal affect.   Procedures:   None  Microbiology summarized: 10/1-blood culture with MSSA 10/2-blood cultures NGTD  Assessment and plan: Principal Problem:   MSSA bacteremia  MSSA bacteremia: POA.  Pulmonary source?  Has no ulcers.  Patient appears well.  Hemodynamically stable.  No fever or leukocytosis.  Lactic acid within normal.  Repeat blood culture NGTD.  TTE without significant finding of vegetation. -Appreciate ID recs-Ancef and TEE which is scheduled for 10/7 -PICC line placed on 10/4  Community-acquired pneumonia: POA.  No significant respiratory distress. -IV Ancef as above.  Completed course for atypical coverage. -Incentive spirometry   Paroxysmal A-fib on Eliquis: Rate controlled. -Continue home Toprol-XL and Eliquis -Optimize electrolytes  Hyperglycemia: No history of diabetes.  A1c 6.1%. Recent Labs  Lab 05/30/23 0734 05/30/23 1119 05/30/23 1642 05/31/23 0725 05/31/23 1118  GLUCAP 101* 88 105* 112* 130*  -Discontinued CBG monitoring and subcu insulin. -Change diet to heart healthy  Essential hypertension: BP slightly elevated this morning.  On amlodipine, Toprol XL and Lasix at home.  -Continue decreased dose of Toprol-XL due to borderline heart rate -Increase p.o. Lasix to 40 mg daily. -Increase amlodipine -P.o. hydralazine as needed   Hyperlipidemia -Continue home statin   BPH with urinary hesitancy -Continue Proscar and Flomax -Bladder scan  Constipation -Bowel regimen   GERD -Continue PPI  Physical deconditioning -PT/OT  Chronic back pain: No acute change. -Scheduled Tylenol -Resume home gabapentin at reduced dose  Body mass index is 23.62 kg/m.          DVT prophylaxis:   apixaban (ELIQUIS) tablet 5 mg  Code Status: Full code Family Communication: Updated patient's wife at bedside Level of care: Med-Surg Status is: Inpatient Remains inpatient appropriate because: MSSA bacteremia   Final disposition: Home Consultants:  Infectious  disease Cardiology for TEE  35 minutes with more than 50% spent in reviewing records, counseling patient/family and coordinating care.   Sch Meds:  Scheduled Meds:  acetaminophen  1,000 mg Oral Q8H   amLODipine  5 mg Oral Daily   apixaban  5 mg Oral BID   Chlorhexidine Gluconate Cloth  6 each Topical Daily   cyanocobalamin  500 mcg Oral Daily   finasteride  5 mg Oral Daily   furosemide  40 mg Oral Daily   gabapentin  100 mg Oral BID   influenza vaccine adjuvanted  0.5 mL Intramuscular Tomorrow-1000   metoprolol succinate  12.5 mg Oral Daily   pantoprazole  40 mg Oral Daily   pravastatin  40 mg Oral q1800   sodium chloride flush  10-40 mL Intracatheter Q12H   tamsulosin  0.4 mg Oral Daily   Continuous Infusions:   ceFAZolin (ANCEF) IV 2 g (05/31/23 0606)   PRN Meds:.hydrALAZINE, melatonin, mouth rinse, [COMPLETED] polyethylene glycol **FOLLOWED BY** polyethylene glycol, prochlorperazine, [COMPLETED] senna-docusate **FOLLOWED BY** senna-docusate, sodium chloride flush  Antimicrobials: Anti-infectives (From admission, onward)    Start     Dose/Rate Route Frequency Ordered Stop   05/28/23 1000  cefTRIAXone (ROCEPHIN) 2 g in sodium chloride 0.9 % 100 mL IVPB  Status:  Discontinued        2 g 200 mL/hr over 30 Minutes Intravenous Every 24 hours 05/28/23 0022 05/28/23 0026   05/28/23 0115  ceFAZolin (ANCEF) IVPB 2g/100 mL premix        2 g 200 mL/hr over 30 Minutes Intravenous Every 8 hours 05/28/23 0026     05/28/23 0100  azithromycin (ZITHROMAX) 500 mg in sodium chloride 0.9 % 250 mL IVPB  Status:  Discontinued        500 mg 250 mL/hr over 60 Minutes Intravenous Daily at bedtime 05/28/23 0022 05/28/23 1052   05/27/23 2200  cefTRIAXone (ROCEPHIN) 1 g in sodium chloride 0.9 % 100 mL IVPB        1 g 200 mL/hr over 30 Minutes Intravenous  Once 05/27/23 2145 05/27/23 2326   05/27/23 2200  doxycycline (VIBRA-TABS) tablet 100 mg        100 mg Oral  Once 05/27/23 2145 05/27/23 2241         I have personally reviewed the following labs and images: CBC: Recent Labs  Lab 05/26/23 2143 05/27/23 0246 05/27/23 2215 05/28/23 0533 05/29/23 0656 05/30/23 0329 05/31/23 0337  WBC 16.3*   < > 7.3 5.4 4.5 5.5 4.8  NEUTROABS 13.5*  --  5.1  --   --   --   --   HGB 12.1*   < > 10.9* 9.4* 10.1* 10.3* 9.9*  HCT 38.0*   < > 34.0* 29.8* 31.5* 32.5* 30.8*  MCV 99.5   < > 100.0 99.7 102.3* 101.6* 99.7  PLT 191   < > 167 136* 142* 150 155   < > = values in this interval not displayed.   BMP &GFR Recent Labs  Lab 05/27/23 2215 05/28/23 0533 05/29/23 0656 05/30/23 0329 05/31/23 0337  NA 138 138 139 138 139  K 4.1 3.7 4.1 4.0 3.8  CL 101 107 106 103 102  CO2 23 22 24 28 27   GLUCOSE 144* 101* 101* 103* 111*  BUN 28* 25* 20 22 21   CREATININE 1.43* 1.34* 1.43* 1.54* 1.58*  CALCIUM 8.8* 8.0* 8.3* 8.5* 8.4*  MG  --  1.9 2.1 2.0 2.0  PHOS  --  2.5 3.1 2.9 3.7   Estimated Creatinine Clearance: 36.9 mL/min (A) (by C-G formula based on SCr of 1.58 mg/dL (H)). Liver & Pancreas: Recent Labs  Lab 05/26/23 2143 05/29/23 0656 05/30/23 0329 05/31/23 0337  AST 19  --   --   --   ALT 13  --   --   --   ALKPHOS 53  --   --   --   BILITOT 1.1  --   --   --   PROT 6.1*  --   --   --   ALBUMIN 3.9 2.8* 2.9* 2.8*   No results for input(s): "LIPASE", "AMYLASE" in the last 168 hours. No results for input(s): "AMMONIA" in the last 168 hours. Diabetic: No results for input(s): "HGBA1C" in the last 72 hours.  Recent Labs  Lab 05/30/23 0734 05/30/23 1119 05/30/23 1642 05/31/23 0725 05/31/23 1118  GLUCAP 101* 88 105* 112* 130*   Cardiac Enzymes: No results for input(s): "CKTOTAL", "CKMB", "CKMBINDEX", "TROPONINI" in the last 168 hours. No results for input(s): "PROBNP" in the last 8760 hours. Coagulation Profile: No results for input(s): "INR", "PROTIME" in the last 168 hours. Thyroid Function Tests: No results for input(s): "TSH", "T4TOTAL", "FREET4", "T3FREE",  "THYROIDAB" in the last 72 hours. Lipid Profile: No results for input(s): "CHOL", "HDL", "LDLCALC", "TRIG", "CHOLHDL", "LDLDIRECT" in the last 72 hours. Anemia Panel: No results for input(s): "VITAMINB12", "FOLATE", "FERRITIN", "TIBC", "IRON", "RETICCTPCT" in the last 72 hours. Urine analysis:    Component Value Date/Time   COLORURINE YELLOW 05/26/2023 2121   APPEARANCEUR CLEAR 05/26/2023 2121   LABSPEC 1.017 05/26/2023 2121   PHURINE 6.0 05/26/2023 2121   GLUCOSEU NEGATIVE 05/26/2023 2121   HGBUR NEGATIVE 05/26/2023 2121   BILIRUBINUR NEGATIVE 05/26/2023 2121   BILIRUBINUR Negative 04/29/2023 1342   KETONESUR NEGATIVE 05/26/2023 2121   PROTEINUR NEGATIVE 05/26/2023 2121   UROBILINOGEN 0.2 04/29/2023 1342   UROBILINOGEN 1 02/23/2014 1404   NITRITE NEGATIVE 05/26/2023 2121   LEUKOCYTESUR NEGATIVE 05/26/2023 2121   Sepsis Labs: Invalid input(s): "PROCALCITONIN", "LACTICIDVEN"  Microbiology: Recent Results (from the past 240 hour(s))  Blood culture (routine x 2)     Status: Abnormal   Collection Time: 05/26/23 10:40 PM   Specimen: BLOOD  Result Value Ref Range Status   Specimen Description BLOOD SITE NOT SPECIFIED  Final   Special Requests   Final    BOTTLES DRAWN AEROBIC AND ANAEROBIC Blood Culture adequate volume   Culture  Setup Time   Final    GRAM POSITIVE COCCI IN CLUSTERS IN BOTH AEROBIC AND ANAEROBIC BOTTLES CRITICAL RESULT CALLED TO, READ BACK BY AND VERIFIED WITH: RN NIKKI KOHUT ON 05/27/23 @ 1715 BY DRT Performed at [facility], [address] [place], [place] [address]    Culture STAPHYLOCOCCUS AUREUS (A)  Final   Report Status 05/29/2023 FINAL  Final   Organism ID, Bacteria STAPHYLOCOCCUS AUREUS  Final      Susceptibility   Staphylococcus aureus - MIC*    CIPROFLOXACIN <=0.5 SENSITIVE Sensitive     ERYTHROMYCIN <=0.25 SENSITIVE Sensitive     GENTAMICIN <=0.5 SENSITIVE Sensitive     OXACILLIN <=0.25 SENSITIVE Sensitive  TETRACYCLINE <=1 SENSITIVE  Sensitive     VANCOMYCIN 1 SENSITIVE Sensitive     TRIMETH/SULFA <=10 SENSITIVE Sensitive     CLINDAMYCIN <=0.25 SENSITIVE Sensitive     RIFAMPIN <=0.5 SENSITIVE Sensitive     Inducible Clindamycin NEGATIVE Sensitive     LINEZOLID 2 SENSITIVE Sensitive     * STAPHYLOCOCCUS AUREUS  Blood culture (routine x 2)     Status: Abnormal   Collection Time: 05/26/23 10:40 PM   Specimen: BLOOD  Result Value Ref Range Status   Specimen Description BLOOD SITE NOT SPECIFIED  Final   Special Requests   Final    BOTTLES DRAWN AEROBIC AND ANAEROBIC Blood Culture adequate volume   Culture  Setup Time   Final    GRAM POSITIVE COCCI IN CLUSTERS ANAEROBIC BOTTLE ONLY CRITICAL VALUE NOTED.  VALUE IS CONSISTENT WITH PREVIOUSLY REPORTED AND CALLED VALUE.    Culture (A)  Final    STAPHYLOCOCCUS AUREUS SUSCEPTIBILITIES PERFORMED ON PREVIOUS CULTURE WITHIN THE LAST 5 DAYS. Performed at [facility], [address], [place], [place] [address]    Report Status 05/29/2023 FINAL  Final  Blood Culture ID Panel (Reflexed)     Status: Abnormal   Collection Time: 05/26/23 10:40 PM  Result Value Ref Range Status   Enterococcus faecalis NOT DETECTED NOT DETECTED Final   Enterococcus Faecium NOT DETECTED NOT DETECTED Final   Listeria monocytogenes NOT DETECTED NOT DETECTED Final   Staphylococcus species DETECTED (A) NOT DETECTED Final    Comment: CRITICAL RESULT CALLED TO, READ BACK BY AND VERIFIED WITH: RN NIKKI KOHUT ON 05/27/23 @ 1715 BY DRT    Staphylococcus aureus (BCID) DETECTED (A) NOT DETECTED Final    Comment: CRITICAL RESULT CALLED TO, READ BACK BY AND VERIFIED WITH: RN NIKKI KOHUT ON 05/27/23 @ 1715 BY DRT    Staphylococcus epidermidis NOT DETECTED NOT DETECTED Final   Staphylococcus lugdunensis NOT DETECTED NOT DETECTED Final   Streptococcus species NOT DETECTED NOT DETECTED Final   Streptococcus agalactiae NOT DETECTED NOT DETECTED Final   Streptococcus pneumoniae NOT DETECTED NOT DETECTED  Final   Streptococcus pyogenes NOT DETECTED NOT DETECTED Final   A.calcoaceticus-baumannii NOT DETECTED NOT DETECTED Final   Bacteroides fragilis NOT DETECTED NOT DETECTED Final   Enterobacterales NOT DETECTED NOT DETECTED Final   Enterobacter cloacae complex NOT DETECTED NOT DETECTED Final   Escherichia coli NOT DETECTED NOT DETECTED Final   Klebsiella aerogenes NOT DETECTED NOT DETECTED Final   Klebsiella oxytoca NOT DETECTED NOT DETECTED Final   Klebsiella pneumoniae NOT DETECTED NOT DETECTED Final   Proteus species NOT DETECTED NOT DETECTED Final   Salmonella species NOT DETECTED NOT DETECTED Final   Serratia marcescens NOT DETECTED NOT DETECTED Final   Haemophilus influenzae NOT DETECTED NOT DETECTED Final   Neisseria meningitidis NOT DETECTED NOT DETECTED Final   Pseudomonas aeruginosa NOT DETECTED NOT DETECTED Final   Stenotrophomonas maltophilia NOT DETECTED NOT DETECTED Final   Candida albicans NOT DETECTED NOT DETECTED Final   Candida auris NOT DETECTED NOT DETECTED Final   Candida glabrata NOT DETECTED NOT DETECTED Final   Candida krusei NOT DETECTED NOT DETECTED Final   Candida parapsilosis NOT DETECTED NOT DETECTED Final   Candida tropicalis NOT DETECTED NOT DETECTED Final   Cryptococcus neoformans/gattii NOT DETECTED NOT DETECTED Final   Meth resistant mecA/C and MREJ NOT DETECTED NOT DETECTED Final    Comment: Performed at [facility], [address], [place], [place] [address]  Blood culture (routine x  2)     Status: None (Preliminary result)   Collection Time: 05/27/23 10:20 PM   Specimen: BLOOD  Result Value Ref Range Status   Specimen Description BLOOD BLOOD RIGHT WRIST  Final   Special Requests   Final    BOTTLES DRAWN AEROBIC AND ANAEROBIC Blood Culture adequate volume   Culture   Final    NO GROWTH 4 DAYS Performed at [facility], [address], [place], [place] [address]    Report Status PENDING  Incomplete  Blood culture (routine x 2)      Status: None (Preliminary result)   Collection Time: 05/27/23 10:20 PM   Specimen: BLOOD  Result Value Ref Range Status   Specimen Description BLOOD BLOOD LEFT WRIST  Final   Special Requests   Final    BOTTLES DRAWN AEROBIC AND ANAEROBIC Blood Culture results may not be optimal due to an inadequate volume of blood received in culture bottles   Culture   Final    NO GROWTH 4 DAYS Performed at [facility], [address], [place], [place] [address]    Report Status PENDING  Incomplete    Radiology Studies: No results found.    Mazikeen Hehn T. Giovanni Biby [facility]  If 7PM-7AM, please contact night-coverage [email or web address] 05/31/2023, 11:37 AM

## 2023-05-31 NOTE — H&P (View-Only) (Signed)
PROGRESS NOTE  [name] GNF:[id number] DOB: [birth year]   PCP: Kristian Covey, MD  Patient is from: Home.  Lives with his wife.  DOA: 05/27/2023 LOS: 4  Chief complaints Chief Complaint  Patient presents with   Abnormal Labs      Brief Narrative / Interim history: 89 year old M with PMH of paroxysmal A-fib on Eliquis, HTN, CKD-3B and recent hospitalization for CAP returning to the hospital a day after discharge due to blood culture showing MSSA bacteremia.  He was hemodynamically stable.  Labs without leukocytosis or significant finding.  Blood cultures drawn.  He was initially started on IV ceftriaxone and doxycycline, and continued on IV Ancef and azithromycin.   Repeat blood culture NGTD.   TTE without vegetation or significant finding.  PICC line placed on 10/4.  TEE planned for 10/7.  Likely discharge on 10/7 after TEE  Subjective: Seen and examined earlier this morning.  No major events overnight of this morning.  No complaints other than chronic back pain.  Denies new change.  Had bowel movements.  Patient's wife at bedside.  Objective: Vitals:   05/30/23 0951 05/30/23 1645 05/31/23 0616 05/31/23 0944  BP: 138/68 (!) 159/67 (!) 176/73 (!) 150/64  Pulse: 65 74 77 73  Resp:    (!) 8  Temp: 98 F (36.7 C) 98.4 F (36.9 C) 97.6 F (36.4 C) 97.8 F (36.6 C)  TempSrc: Oral Oral    SpO2: 98% 97% 100% 100%  Weight:      Height:        Examination:  GENERAL: No apparent distress.  Nontoxic. HEENT: MMM.  Vision grossly intact.  Diminished hearing. NECK: Supple.  No apparent JVD.  RESP:  No IWOB.  Fair aeration bilaterally. CVS: Irregular rhythm.  Normal rate.  Heart sounds normal.  ABD/GI/GU: BS+. Abd soft, NTND.  MSK/EXT:  Moves extremities. No apparent deformity.  Trace LLE edema. SKIN: no apparent skin lesion or wound NEURO: Awake, alert and oriented appropriately.  No apparent focal neuro deficit. PSYCH: Calm. Normal affect.   Procedures:   None  Microbiology summarized: 10/1-blood culture with MSSA 10/2-blood cultures NGTD  Assessment and plan: Principal Problem:   MSSA bacteremia  MSSA bacteremia: POA.  Pulmonary source?  Has no ulcers.  Patient appears well.  Hemodynamically stable.  No fever or leukocytosis.  Lactic acid within normal.  Repeat blood culture NGTD.  TTE without significant finding of vegetation. -Appreciate ID recs-Ancef and TEE which is scheduled for 10/7 -PICC line placed on 10/4  Community-acquired pneumonia: POA.  No significant respiratory distress. -IV Ancef as above.  Completed course for atypical coverage. -Incentive spirometry   Paroxysmal A-fib on Eliquis: Rate controlled. -Continue home Toprol-XL and Eliquis -Optimize electrolytes  Hyperglycemia: No history of diabetes.  A1c 6.1%. Recent Labs  Lab 05/30/23 0734 05/30/23 1119 05/30/23 1642 05/31/23 0725 05/31/23 1118  GLUCAP 101* 88 105* 112* 130*  -Discontinued CBG monitoring and subcu insulin. -Change diet to heart healthy  Essential hypertension: BP slightly elevated this morning.  On amlodipine, Toprol XL and Lasix at home.  -Continue decreased dose of Toprol-XL due to borderline heart rate -Increase p.o. Lasix to 40 mg daily. -Increase amlodipine -P.o. hydralazine as needed   Hyperlipidemia -Continue home statin   BPH with urinary hesitancy -Continue Proscar and Flomax -Bladder scan  Constipation -Bowel regimen   GERD -Continue PPI  Physical deconditioning -PT/OT  Chronic back pain: No acute change. -Scheduled Tylenol -Resume home gabapentin at reduced dose  Body mass index is 23.62 kg/m.          DVT prophylaxis:   apixaban (ELIQUIS) tablet 5 mg  Code Status: Full code Family Communication: Updated patient's wife at bedside Level of care: Med-Surg Status is: Inpatient Remains inpatient appropriate because: MSSA bacteremia   Final disposition: Home Consultants:  Infectious  disease Cardiology for TEE  35 minutes with more than 50% spent in reviewing records, counseling patient/family and coordinating care.   Sch Meds:  Scheduled Meds:  acetaminophen  1,000 mg Oral Q8H   amLODipine  5 mg Oral Daily   apixaban  5 mg Oral BID   Chlorhexidine Gluconate Cloth  6 each Topical Daily   cyanocobalamin  500 mcg Oral Daily   finasteride  5 mg Oral Daily   furosemide  40 mg Oral Daily   gabapentin  100 mg Oral BID   influenza vaccine adjuvanted  0.5 mL Intramuscular Tomorrow-1000   metoprolol succinate  12.5 mg Oral Daily   pantoprazole  40 mg Oral Daily   pravastatin  40 mg Oral q1800   sodium chloride flush  10-40 mL Intracatheter Q12H   tamsulosin  0.4 mg Oral Daily   Continuous Infusions:   ceFAZolin (ANCEF) IV 2 g (05/31/23 0606)   PRN Meds:.hydrALAZINE, melatonin, mouth rinse, [COMPLETED] polyethylene glycol **FOLLOWED BY** polyethylene glycol, prochlorperazine, [COMPLETED] senna-docusate **FOLLOWED BY** senna-docusate, sodium chloride flush  Antimicrobials: Anti-infectives (From admission, onward)    Start     Dose/Rate Route Frequency Ordered Stop   05/28/23 1000  cefTRIAXone (ROCEPHIN) 2 g in sodium chloride 0.9 % 100 mL IVPB  Status:  Discontinued        2 g 200 mL/hr over 30 Minutes Intravenous Every 24 hours 05/28/23 0022 05/28/23 0026   05/28/23 0115  ceFAZolin (ANCEF) IVPB 2g/100 mL premix        2 g 200 mL/hr over 30 Minutes Intravenous Every 8 hours 05/28/23 0026     05/28/23 0100  azithromycin (ZITHROMAX) 500 mg in sodium chloride 0.9 % 250 mL IVPB  Status:  Discontinued        500 mg 250 mL/hr over 60 Minutes Intravenous Daily at bedtime 05/28/23 0022 05/28/23 1052   05/27/23 2200  cefTRIAXone (ROCEPHIN) 1 g in sodium chloride 0.9 % 100 mL IVPB        1 g 200 mL/hr over 30 Minutes Intravenous  Once 05/27/23 2145 05/27/23 2326   05/27/23 2200  doxycycline (VIBRA-TABS) tablet 100 mg        100 mg Oral  Once 05/27/23 2145 05/27/23 2241         I have personally reviewed the following labs and images: CBC: Recent Labs  Lab 05/26/23 2143 05/27/23 0246 05/27/23 2215 05/28/23 0533 05/29/23 0656 05/30/23 0329 05/31/23 0337  WBC 16.3*   < > 7.3 5.4 4.5 5.5 4.8  NEUTROABS 13.5*  --  5.1  --   --   --   --   HGB 12.1*   < > 10.9* 9.4* 10.1* 10.3* 9.9*  HCT 38.0*   < > 34.0* 29.8* 31.5* 32.5* 30.8*  MCV 99.5   < > 100.0 99.7 102.3* 101.6* 99.7  PLT 191   < > 167 136* 142* 150 155   < > = values in this interval not displayed.   BMP &GFR Recent Labs  Lab 05/27/23 2215 05/28/23 0533 05/29/23 0656 05/30/23 0329 05/31/23 0337  NA 138 138 139 138 139  K 4.1 3.7 4.1 4.0 3.8  CL 101 107 106 103 102  CO2 23 22 24 28 27   GLUCOSE 144* 101* 101* 103* 111*  BUN 28* 25* 20 22 21   CREATININE 1.43* 1.34* 1.43* 1.54* 1.58*  CALCIUM 8.8* 8.0* 8.3* 8.5* 8.4*  MG  --  1.9 2.1 2.0 2.0  PHOS  --  2.5 3.1 2.9 3.7   Estimated Creatinine Clearance: 36.9 mL/min (A) (by C-G formula based on SCr of 1.58 mg/dL (H)). Liver & Pancreas: Recent Labs  Lab 05/26/23 2143 05/29/23 0656 05/30/23 0329 05/31/23 0337  AST 19  --   --   --   ALT 13  --   --   --   ALKPHOS 53  --   --   --   BILITOT 1.1  --   --   --   PROT 6.1*  --   --   --   ALBUMIN 3.9 2.8* 2.9* 2.8*   No results for input(s): "LIPASE", "AMYLASE" in the last 168 hours. No results for input(s): "AMMONIA" in the last 168 hours. Diabetic: No results for input(s): "HGBA1C" in the last 72 hours.  Recent Labs  Lab 05/30/23 0734 05/30/23 1119 05/30/23 1642 05/31/23 0725 05/31/23 1118  GLUCAP 101* 88 105* 112* 130*   Cardiac Enzymes: No results for input(s): "CKTOTAL", "CKMB", "CKMBINDEX", "TROPONINI" in the last 168 hours. No results for input(s): "PROBNP" in the last 8760 hours. Coagulation Profile: No results for input(s): "INR", "PROTIME" in the last 168 hours. Thyroid Function Tests: No results for input(s): "TSH", "T4TOTAL", "FREET4", "T3FREE",  "THYROIDAB" in the last 72 hours. Lipid Profile: No results for input(s): "CHOL", "HDL", "LDLCALC", "TRIG", "CHOLHDL", "LDLDIRECT" in the last 72 hours. Anemia Panel: No results for input(s): "VITAMINB12", "FOLATE", "FERRITIN", "TIBC", "IRON", "RETICCTPCT" in the last 72 hours. Urine analysis:    Component Value Date/Time   COLORURINE YELLOW 05/26/2023 2121   APPEARANCEUR CLEAR 05/26/2023 2121   LABSPEC 1.017 05/26/2023 2121   PHURINE 6.0 05/26/2023 2121   GLUCOSEU NEGATIVE 05/26/2023 2121   HGBUR NEGATIVE 05/26/2023 2121   BILIRUBINUR NEGATIVE 05/26/2023 2121   BILIRUBINUR Negative 04/29/2023 1342   KETONESUR NEGATIVE 05/26/2023 2121   PROTEINUR NEGATIVE 05/26/2023 2121   UROBILINOGEN 0.2 04/29/2023 1342   UROBILINOGEN 1 02/23/2014 1404   NITRITE NEGATIVE 05/26/2023 2121   LEUKOCYTESUR NEGATIVE 05/26/2023 2121   Sepsis Labs: Invalid input(s): "PROCALCITONIN", "LACTICIDVEN"  Microbiology: Recent Results (from the past 240 hour(s))  Blood culture (routine x 2)     Status: Abnormal   Collection Time: 05/26/23 10:40 PM   Specimen: BLOOD  Result Value Ref Range Status   Specimen Description BLOOD SITE NOT SPECIFIED  Final   Special Requests   Final    BOTTLES DRAWN AEROBIC AND ANAEROBIC Blood Culture adequate volume   Culture  Setup Time   Final    GRAM POSITIVE COCCI IN CLUSTERS IN BOTH AEROBIC AND ANAEROBIC BOTTLES CRITICAL RESULT CALLED TO, READ BACK BY AND VERIFIED WITH: RN NIKKI KOHUT ON 05/27/23 @ 1715 BY DRT Performed at [facility], [address] [place], [place] [address]    Culture STAPHYLOCOCCUS AUREUS (A)  Final   Report Status 05/29/2023 FINAL  Final   Organism ID, Bacteria STAPHYLOCOCCUS AUREUS  Final      Susceptibility   Staphylococcus aureus - MIC*    CIPROFLOXACIN <=0.5 SENSITIVE Sensitive     ERYTHROMYCIN <=0.25 SENSITIVE Sensitive     GENTAMICIN <=0.5 SENSITIVE Sensitive     OXACILLIN <=0.25 SENSITIVE Sensitive  TETRACYCLINE <=1 SENSITIVE  Sensitive     VANCOMYCIN 1 SENSITIVE Sensitive     TRIMETH/SULFA <=10 SENSITIVE Sensitive     CLINDAMYCIN <=0.25 SENSITIVE Sensitive     RIFAMPIN <=0.5 SENSITIVE Sensitive     Inducible Clindamycin NEGATIVE Sensitive     LINEZOLID 2 SENSITIVE Sensitive     * STAPHYLOCOCCUS AUREUS  Blood culture (routine x 2)     Status: Abnormal   Collection Time: 05/26/23 10:40 PM   Specimen: BLOOD  Result Value Ref Range Status   Specimen Description BLOOD SITE NOT SPECIFIED  Final   Special Requests   Final    BOTTLES DRAWN AEROBIC AND ANAEROBIC Blood Culture adequate volume   Culture  Setup Time   Final    GRAM POSITIVE COCCI IN CLUSTERS ANAEROBIC BOTTLE ONLY CRITICAL VALUE NOTED.  VALUE IS CONSISTENT WITH PREVIOUSLY REPORTED AND CALLED VALUE.    Culture (A)  Final    STAPHYLOCOCCUS AUREUS SUSCEPTIBILITIES PERFORMED ON PREVIOUS CULTURE WITHIN THE LAST 5 DAYS. Performed at [facility], [address], [place], [place] [address]    Report Status 05/29/2023 FINAL  Final  Blood Culture ID Panel (Reflexed)     Status: Abnormal   Collection Time: 05/26/23 10:40 PM  Result Value Ref Range Status   Enterococcus faecalis NOT DETECTED NOT DETECTED Final   Enterococcus Faecium NOT DETECTED NOT DETECTED Final   Listeria monocytogenes NOT DETECTED NOT DETECTED Final   Staphylococcus species DETECTED (A) NOT DETECTED Final    Comment: CRITICAL RESULT CALLED TO, READ BACK BY AND VERIFIED WITH: RN NIKKI KOHUT ON 05/27/23 @ 1715 BY DRT    Staphylococcus aureus (BCID) DETECTED (A) NOT DETECTED Final    Comment: CRITICAL RESULT CALLED TO, READ BACK BY AND VERIFIED WITH: RN NIKKI KOHUT ON 05/27/23 @ 1715 BY DRT    Staphylococcus epidermidis NOT DETECTED NOT DETECTED Final   Staphylococcus lugdunensis NOT DETECTED NOT DETECTED Final   Streptococcus species NOT DETECTED NOT DETECTED Final   Streptococcus agalactiae NOT DETECTED NOT DETECTED Final   Streptococcus pneumoniae NOT DETECTED NOT DETECTED  Final   Streptococcus pyogenes NOT DETECTED NOT DETECTED Final   A.calcoaceticus-baumannii NOT DETECTED NOT DETECTED Final   Bacteroides fragilis NOT DETECTED NOT DETECTED Final   Enterobacterales NOT DETECTED NOT DETECTED Final   Enterobacter cloacae complex NOT DETECTED NOT DETECTED Final   Escherichia coli NOT DETECTED NOT DETECTED Final   Klebsiella aerogenes NOT DETECTED NOT DETECTED Final   Klebsiella oxytoca NOT DETECTED NOT DETECTED Final   Klebsiella pneumoniae NOT DETECTED NOT DETECTED Final   Proteus species NOT DETECTED NOT DETECTED Final   Salmonella species NOT DETECTED NOT DETECTED Final   Serratia marcescens NOT DETECTED NOT DETECTED Final   Haemophilus influenzae NOT DETECTED NOT DETECTED Final   Neisseria meningitidis NOT DETECTED NOT DETECTED Final   Pseudomonas aeruginosa NOT DETECTED NOT DETECTED Final   Stenotrophomonas maltophilia NOT DETECTED NOT DETECTED Final   Candida albicans NOT DETECTED NOT DETECTED Final   Candida auris NOT DETECTED NOT DETECTED Final   Candida glabrata NOT DETECTED NOT DETECTED Final   Candida krusei NOT DETECTED NOT DETECTED Final   Candida parapsilosis NOT DETECTED NOT DETECTED Final   Candida tropicalis NOT DETECTED NOT DETECTED Final   Cryptococcus neoformans/gattii NOT DETECTED NOT DETECTED Final   Meth resistant mecA/C and MREJ NOT DETECTED NOT DETECTED Final    Comment: Performed at [facility], [address], [place], [place] [address]  Blood culture (routine x  2)     Status: None (Preliminary result)   Collection Time: 05/27/23 10:20 PM   Specimen: BLOOD  Result Value Ref Range Status   Specimen Description BLOOD BLOOD RIGHT WRIST  Final   Special Requests   Final    BOTTLES DRAWN AEROBIC AND ANAEROBIC Blood Culture adequate volume   Culture   Final    NO GROWTH 4 DAYS Performed at [facility], [address], [place], [place] [address]    Report Status PENDING  Incomplete  Blood culture (routine x 2)      Status: None (Preliminary result)   Collection Time: 05/27/23 10:20 PM   Specimen: BLOOD  Result Value Ref Range Status   Specimen Description BLOOD BLOOD LEFT WRIST  Final   Special Requests   Final    BOTTLES DRAWN AEROBIC AND ANAEROBIC Blood Culture results may not be optimal due to an inadequate volume of blood received in culture bottles   Culture   Final    NO GROWTH 4 DAYS Performed at [facility], [address], [place], [place] [address]    Report Status PENDING  Incomplete    Radiology Studies: No results found.    Mazikeen Hehn T. Giovanni Biby [facility]  If 7PM-7AM, please contact night-coverage [email or web address] 05/31/2023, 11:37 AM

## 2023-05-31 NOTE — Plan of Care (Signed)
  Problem: Coping: Goal: Ability to adjust to condition or change in health will improve Outcome: Progressing   Problem: Fluid Volume: Goal: Ability to maintain a balanced intake and output will improve Outcome: Progressing   Problem: Health Behavior/Discharge Planning: Goal: Ability to identify and utilize available resources and services will improve Outcome: Progressing Goal: Ability to manage health-related needs will improve Outcome: Progressing   Problem: Metabolic: Goal: Ability to maintain appropriate glucose levels will improve Outcome: Progressing   Problem: Nutritional: Goal: Maintenance of adequate nutrition will improve Outcome: Progressing Goal: Progress toward achieving an optimal weight will improve Outcome: Progressing   Problem: Skin Integrity: Goal: Risk for impaired skin integrity will decrease Outcome: Progressing   Problem: Tissue Perfusion: Goal: Adequacy of tissue perfusion will improve Outcome: Progressing   Problem: Education: Goal: Knowledge of General Education information will improve Description: Including pain rating scale, medication(s)/side effects and non-pharmacologic comfort measures Outcome: Progressing   Problem: Health Behavior/Discharge Planning: Goal: Ability to manage health-related needs will improve Outcome: Progressing   Problem: Clinical Measurements: Goal: Ability to maintain clinical measurements within normal limits will improve Outcome: Progressing Goal: Will remain free from infection Outcome: Progressing Goal: Diagnostic test results will improve Outcome: Progressing Goal: Respiratory complications will improve Outcome: Progressing Goal: Cardiovascular complication will be avoided Outcome: Progressing   Problem: Activity: Goal: Risk for activity intolerance will decrease Outcome: Progressing   Problem: Nutrition: Goal: Adequate nutrition will be maintained Outcome: Progressing   Problem: Coping: Goal:  Level of anxiety will decrease Outcome: Progressing   Problem: Elimination: Goal: Will not experience complications related to bowel motility Outcome: Progressing Goal: Will not experience complications related to urinary retention Outcome: Progressing   Problem: Pain Managment: Goal: General experience of comfort will improve Outcome: Progressing   Problem: Safety: Goal: Ability to remain free from injury will improve Outcome: Progressing   Problem: Skin Integrity: Goal: Risk for impaired skin integrity will decrease Outcome: Progressing   

## 2023-06-01 ENCOUNTER — Inpatient Hospital Stay (HOSPITAL_COMMUNITY): Payer: Medicare (Managed Care)

## 2023-06-01 ENCOUNTER — Encounter (HOSPITAL_COMMUNITY)
Admission: EM | Disposition: A | Discharge status: Home / Self Care | Payer: Self-pay | Source: Home / Self Care | Attending: Student

## 2023-06-01 ENCOUNTER — Inpatient Hospital Stay (HOSPITAL_COMMUNITY): Payer: Medicare (Managed Care) | Admitting: Certified Registered Nurse Anesthetist

## 2023-06-01 DIAGNOSIS — R7881 Bacteremia: Secondary | ICD-10-CM

## 2023-06-01 DIAGNOSIS — I340 Nonrheumatic mitral (valve) insufficiency: Secondary | ICD-10-CM

## 2023-06-01 DIAGNOSIS — L03119 Cellulitis of unspecified part of limb: Secondary | ICD-10-CM

## 2023-06-01 DIAGNOSIS — B9561 Methicillin susceptible Staphylococcus aureus infection as the cause of diseases classified elsewhere: Secondary | ICD-10-CM | POA: Diagnosis not present

## 2023-06-01 HISTORY — PX: TEE WITHOUT CARDIOVERSION: SHX5443

## 2023-06-01 LAB — CULTURE, BLOOD (ROUTINE X 2)
Culture: NO GROWTH
Culture: NO GROWTH
Special Requests: ADEQUATE

## 2023-06-01 LAB — RENAL FUNCTION PANEL
Albumin: 2.8 g/dL — ABNORMAL LOW (ref 3.5–5.0)
Anion gap: 9 (ref 5–15)
BUN: 24 mg/dL — ABNORMAL HIGH (ref 8–23)
CO2: 28 mmol/L (ref 22–32)
Calcium: 8.4 mg/dL — ABNORMAL LOW (ref 8.9–10.3)
Chloride: 103 mmol/L (ref 98–111)
Creatinine, Ser: 1.52 mg/dL — ABNORMAL HIGH (ref 0.61–1.24)
GFR, Estimated: 44 mL/min — ABNORMAL LOW (ref >60)
Glucose, Bld: 115 mg/dL — ABNORMAL HIGH (ref 70–99)
Phosphorus: 3.8 mg/dL (ref 2.5–4.6)
Potassium: 3.5 mmol/L (ref 3.5–5.1)
Sodium: 140 mmol/L (ref 135–145)

## 2023-06-01 LAB — CBC
HCT: 31.5 % — ABNORMAL LOW (ref 39.0–52.0)
Hemoglobin: 9.9 g/dL — ABNORMAL LOW (ref 13.0–17.0)
MCH: 31.3 pg (ref 26.0–34.0)
MCHC: 31.4 g/dL (ref 30.0–36.0)
MCV: 99.7 fL (ref 80.0–100.0)
Platelets: 181 10*3/uL (ref 150–400)
RBC: 3.16 MIL/uL — ABNORMAL LOW (ref 4.22–5.81)
RDW: 16.6 % — ABNORMAL HIGH (ref 11.5–15.5)
WBC: 5.6 10*3/uL (ref 4.0–10.5)
nRBC: 0 % (ref 0.0–0.2)

## 2023-06-01 LAB — MAGNESIUM: Magnesium: 2 mg/dL (ref 1.7–2.4)

## 2023-06-01 LAB — ECHO TEE

## 2023-06-01 SURGERY — ECHOCARDIOGRAM, TRANSESOPHAGEAL
Anesthesia: Monitor Anesthesia Care

## 2023-06-01 MED ORDER — PROPOFOL 500 MG/50ML IV EMUL
INTRAVENOUS | Status: DC | PRN
  Administered 2023-06-01: 75 ug/kg/min via INTRAVENOUS

## 2023-06-01 MED ORDER — CEFAZOLIN IV (FOR PTA / DISCHARGE USE ONLY): 2.0000 g | Freq: Three times a day (TID) | INTRAVENOUS | 0 refills | Status: AC

## 2023-06-01 MED ORDER — METOPROLOL SUCCINATE ER 25 MG PO TB24: 25.0000 mg | ORAL_TABLET | Freq: Every day | ORAL | 1 refills | Status: DC

## 2023-06-01 MED ORDER — PROPOFOL 10 MG/ML IV BOLUS
INTRAVENOUS | Status: DC | PRN
  Administered 2023-06-01: 30 mg via INTRAVENOUS
  Administered 2023-06-01: 20 mg via INTRAVENOUS

## 2023-06-01 MED ORDER — SODIUM CHLORIDE 0.9 % IV SOLN: INTRAVENOUS | Status: DC

## 2023-06-01 NOTE — Progress Notes (Signed)
[facility]  Date of Admission:  05/27/2023      Total days of antibiotics 7  Cefazolin         ASSESSMENT: [name] is a 89 y.o. male admitted with:  MSSA Bacteremia -  -BCx + 10/01, Cleared 10/02 -TEE 10/7 Negative  -unclear the source - though to be r/t cellulitis. Continue 4 weeks of treatment through 10/30  -ID follow up arranged.  -PICC line 10/04    PLAN: IV abx and plan as outlined below  Otherwise ready to D/C once home health services can be arranged.   OPAT ORDERS:  Diagnosis: Bacteremia, unclear source  Culture Result: MSSA   Allergies  Allergen Reactions   Amoxicillin-Pot Clavulanate Diarrhea and Nausea Only   Losartan Other (See Comments) and Nausea And Vomiting    Elevated creatinine levels Elevated creatinine levels     Discharge antibiotics to be given via PICC line: Cefazolin 2 gm IV TID    Duration: 4 weeks   End Date: 10/30  [facility] Care Per Protocol with [employer] Use: Home health RN for IV administration and teaching, line care and labs.    Labs weekly while on IV antibiotics: _x_ CBC with differential __ BMP **TWICE WEEKLY ON VANCOMYCIN  _x_ CMP _x_ CRP _x_ ESR __ Vancomycin trough TWICE WEEKLY __ CK  _x_ Please pull PIC at completion of IV antibiotics __ Please leave PIC in place until doctor has seen patient or been notified  Fax weekly labs to [phone]  Clinic Follow Up Appt: 06/22/2023 @ 10:45 am     Principal Problem:   MSSA bacteremia    acetaminophen  1,000 mg Oral Q8H   amLODipine  5 mg Oral Daily   apixaban  5 mg Oral BID   Chlorhexidine Gluconate Cloth  6 each Topical Daily   cyanocobalamin  500 mcg Oral Daily   finasteride  5 mg Oral Daily   furosemide  40 mg Oral Daily   gabapentin  100 mg Oral BID   influenza vaccine adjuvanted  0.5 mL Intramuscular Tomorrow-1000   metoprolol succinate  12.5 mg Oral Daily   pantoprazole  40 mg Oral Daily    pravastatin  40 mg Oral q1800   sodium chloride flush  10-40 mL Intracatheter Q12H   tamsulosin  0.4 mg Oral Daily    SUBJECTIVE: Has 3 daughters locally and someone who comes to sit with him several mornings a week.  He is feeling better. TEE was negative.   Review of Systems: Review of Systems  Constitutional:  Negative for chills and fever.  Gastrointestinal:  Negative for abdominal pain, nausea and vomiting.    Allergies  Allergen Reactions   Amoxicillin-Pot Clavulanate Diarrhea and Nausea Only   Losartan Other (See Comments) and Nausea And Vomiting    Elevated creatinine levels Elevated creatinine levels    OBJECTIVE: Vitals:   06/01/23 1040 06/01/23 1045 06/01/23 1050 06/01/23 1054  BP: (!) 141/67 (!) 147/62 (!) 143/65   Pulse: 78 78 72 68  Resp: (!) 21 18 14 17   Temp:   (!) 97.1 F (36.2 C)   TempSrc:   Temporal   SpO2: 95% 98% 96% 98%  Weight:      Height:       Body mass index is 23.62 kg/m.  Physical Exam Constitutional:      Appearance: Normal appearance.  Cardiovascular:     Rate and Rhythm: Normal rate and  regular rhythm.  Pulmonary:     Effort: Pulmonary effort is normal.     Breath sounds: Normal breath sounds.  Abdominal:     General: Bowel sounds are normal.  Skin:    General: Skin is warm and dry.     Capillary Refill: Capillary refill takes less than 2 seconds.  Neurological:     Mental Status: He is alert and oriented to person, place, and time.     Lab Results Lab Results  Component Value Date   WBC 5.6 06/01/2023   HGB 9.9 (L) 06/01/2023   HCT 31.5 (L) 06/01/2023   MCV 99.7 06/01/2023   PLT 181 06/01/2023    Lab Results  Component Value Date   CREATININE 1.52 (H) 06/01/2023   BUN 24 (H) 06/01/2023   NA 140 06/01/2023   K 3.5 06/01/2023   CL 103 06/01/2023   CO2 28 06/01/2023    Lab Results  Component Value Date   ALT 13 05/26/2023   AST 19 05/26/2023   ALKPHOS 53 05/26/2023   BILITOT 1.1 05/26/2023      Microbiology: Recent Results (from the past 240 hour(s))  Blood culture (routine x 2)     Status: Abnormal   Collection Time: 05/26/23 10:40 PM   Specimen: BLOOD  Result Value Ref Range Status   Specimen Description BLOOD SITE NOT SPECIFIED  Final   Special Requests   Final    BOTTLES DRAWN AEROBIC AND ANAEROBIC Blood Culture adequate volume   Culture  Setup Time   Final    GRAM POSITIVE COCCI IN CLUSTERS IN BOTH AEROBIC AND ANAEROBIC BOTTLES CRITICAL RESULT CALLED TO, READ BACK BY AND VERIFIED WITH: RN NIKKI KOHUT ON 05/27/23 @ 1715 BY DRT Performed at [facility], [address] [place], [place] [address]    Culture STAPHYLOCOCCUS AUREUS (A)  Final   Report Status 05/29/2023 FINAL  Final   Organism ID, Bacteria STAPHYLOCOCCUS AUREUS  Final      Susceptibility   Staphylococcus aureus - MIC*    CIPROFLOXACIN <=0.5 SENSITIVE Sensitive     ERYTHROMYCIN <=0.25 SENSITIVE Sensitive     GENTAMICIN <=0.5 SENSITIVE Sensitive     OXACILLIN <=0.25 SENSITIVE Sensitive     TETRACYCLINE <=1 SENSITIVE Sensitive     VANCOMYCIN 1 SENSITIVE Sensitive     TRIMETH/SULFA <=10 SENSITIVE Sensitive     CLINDAMYCIN <=0.25 SENSITIVE Sensitive     RIFAMPIN <=0.5 SENSITIVE Sensitive     Inducible Clindamycin NEGATIVE Sensitive     LINEZOLID 2 SENSITIVE Sensitive     * STAPHYLOCOCCUS AUREUS  Blood culture (routine x 2)     Status: Abnormal   Collection Time: 05/26/23 10:40 PM   Specimen: BLOOD  Result Value Ref Range Status   Specimen Description BLOOD SITE NOT SPECIFIED  Final   Special Requests   Final    BOTTLES DRAWN AEROBIC AND ANAEROBIC Blood Culture adequate volume   Culture  Setup Time   Final    GRAM POSITIVE COCCI IN CLUSTERS ANAEROBIC BOTTLE ONLY CRITICAL VALUE NOTED.  VALUE IS CONSISTENT WITH PREVIOUSLY REPORTED AND CALLED VALUE.    Culture (A)  Final    STAPHYLOCOCCUS AUREUS SUSCEPTIBILITIES PERFORMED ON PREVIOUS CULTURE WITHIN THE LAST 5 DAYS. Performed at [facility], [address] [place], [place] [address]    Report Status 05/29/2023 FINAL  Final  Blood Culture ID Panel (Reflexed)     Status: Abnormal   Collection Time: 05/26/23 10:40 PM  Result Value  Ref Range Status   Enterococcus faecalis NOT DETECTED NOT DETECTED Final   Enterococcus Faecium NOT DETECTED NOT DETECTED Final   Listeria monocytogenes NOT DETECTED NOT DETECTED Final   Staphylococcus species DETECTED (A) NOT DETECTED Final    Comment: CRITICAL RESULT CALLED TO, READ BACK BY AND VERIFIED WITH: RN NIKKI KOHUT ON 05/27/23 @ 1715 BY DRT    Staphylococcus aureus (BCID) DETECTED (A) NOT DETECTED Final    Comment: CRITICAL RESULT CALLED TO, READ BACK BY AND VERIFIED WITH: RN NIKKI KOHUT ON 05/27/23 @ 1715 BY DRT    Staphylococcus epidermidis NOT DETECTED NOT DETECTED Final   Staphylococcus lugdunensis NOT DETECTED NOT DETECTED Final   Streptococcus species NOT DETECTED NOT DETECTED Final   Streptococcus agalactiae NOT DETECTED NOT DETECTED Final   Streptococcus pneumoniae NOT DETECTED NOT DETECTED Final   Streptococcus pyogenes NOT DETECTED NOT DETECTED Final   A.calcoaceticus-baumannii NOT DETECTED NOT DETECTED Final   Bacteroides fragilis NOT DETECTED NOT DETECTED Final   Enterobacterales NOT DETECTED NOT DETECTED Final   Enterobacter cloacae complex NOT DETECTED NOT DETECTED Final   Escherichia coli NOT DETECTED NOT DETECTED Final   Klebsiella aerogenes NOT DETECTED NOT DETECTED Final   Klebsiella oxytoca NOT DETECTED NOT DETECTED Final   Klebsiella pneumoniae NOT DETECTED NOT DETECTED Final   Proteus species NOT DETECTED NOT DETECTED Final   Salmonella species NOT DETECTED NOT DETECTED Final   Serratia marcescens NOT DETECTED NOT DETECTED Final   Haemophilus influenzae NOT DETECTED NOT DETECTED Final   Neisseria meningitidis NOT DETECTED NOT DETECTED Final   Pseudomonas aeruginosa NOT DETECTED NOT DETECTED Final   Stenotrophomonas maltophilia NOT DETECTED NOT DETECTED  Final   Candida albicans NOT DETECTED NOT DETECTED Final   Candida auris NOT DETECTED NOT DETECTED Final   Candida glabrata NOT DETECTED NOT DETECTED Final   Candida krusei NOT DETECTED NOT DETECTED Final   Candida parapsilosis NOT DETECTED NOT DETECTED Final   Candida tropicalis NOT DETECTED NOT DETECTED Final   Cryptococcus neoformans/gattii NOT DETECTED NOT DETECTED Final   Meth resistant mecA/C and MREJ NOT DETECTED NOT DETECTED Final    Comment: Performed at [facility], [address] [place], [place] [address]  Blood culture (routine x 2)     Status: None   Collection Time: 05/27/23 10:20 PM   Specimen: BLOOD  Result Value Ref Range Status   Specimen Description BLOOD BLOOD RIGHT WRIST  Final   Special Requests   Final    BOTTLES DRAWN AEROBIC AND ANAEROBIC Blood Culture adequate volume   Culture   Final    NO GROWTH 5 DAYS Performed at [facility], [address], [place], [place] [address]    Report Status 06/01/2023 FINAL  Final  Blood culture (routine x 2)     Status: None   Collection Time: 05/27/23 10:20 PM   Specimen: BLOOD  Result Value Ref Range Status   Specimen Description BLOOD BLOOD LEFT WRIST  Final   Special Requests   Final    BOTTLES DRAWN AEROBIC AND ANAEROBIC Blood Culture results may not be optimal due to an inadequate volume of blood received in culture bottles   Culture   Final    NO GROWTH 5 DAYS Performed at [facility], [address], [place], [place] [address]    Report Status 06/01/2023 FINAL  Final     Rexene Alberts, MSN, NP-C [facility]  [email or web address] Pager: [phone] Office:  [phone] [facility] Main Line: [phone] *Secure Chat Communication Welcome

## 2023-06-01 NOTE — Transfer of Care (Signed)
Immediate Anesthesia Transfer of Care Note  Patient: [name]  Procedure(s) Performed: TRANSESOPHAGEAL ECHOCARDIOGRAM  Patient Location: Cath Lab  Anesthesia Type:MAC  Level of Consciousness: drowsy and patient cooperative  Airway & Oxygen Therapy: Patient Spontanous Breathing and Patient connected to nasal cannula oxygen  Post-op Assessment: Report given to RN, Post -op Vital signs reviewed and stable, and Patient moving all extremities X 4  Post vital signs: Reviewed and stable  Last Vitals:  Vitals Value Taken Time  BP 141/67 06/01/23 1040  Temp    Pulse 78 06/01/23 1040  Resp 21 06/01/23 1040  SpO2 95 % 06/01/23 1040  Vitals shown include unfiled device data.  Last Pain:  Vitals:   06/01/23 0858  TempSrc: Oral  PainSc:       Patients Stated Pain Goal: 0 (05/31/23 0400)  Complications: No notable events documented.

## 2023-06-01 NOTE — TOC Progression Note (Signed)
Transition of Care [facility]) - Progression Note    Patient Details  Name: [name] MRN: [id number] Date of Birth: 1932-05-11  Transition of [facility]) CM/SW Contact  Tom-Johnson, Hershal Coria, RN Phone Number: 06/01/2023, 3:48 PM  Clinical Narrative:     Patient scheduled for discharge today. Patient to go home with IV abx. [facility] to provide IV abx and RN with [facility]. CM spoke with [facility] and Haywood Lasso states RN will not start till tomorrow afternoon 06/02/23. Fleet Contras with [facility] does not have RN for tonight's and tomorrow morning's dosages.  CM notified MD patient and his wife.   CM will continue to follow.      Expected Discharge Plan and Services         Expected Discharge Date: 06/01/23                                     Social Determinants of Health (SDOH) Interventions SDOH Screenings   Food Insecurity: No Food Insecurity (05/28/2023)  Housing: Low Risk  (05/28/2023)  Transportation Needs: No Transportation Needs (05/28/2023)  Utilities: Not At Risk (05/28/2023)  Depression (PHQ2-9): Low Risk  (11/18/2022)  Financial Resource Strain: Low Risk  (05/01/2022)  Physical Activity: Unknown (05/01/2022)  Social Connections: Socially Integrated (05/01/2022)  Stress: No Stress Concern Present (05/01/2022)  Tobacco Use: High Risk (05/27/2023)    Readmission Risk Interventions    05/29/2023    4:31 PM  Readmission Risk Prevention Plan  Transportation Screening Complete  PCP or Specialist Appt within 5-7 Days Complete  Home Care Screening Complete  Medication Review (RN CM) Referral to Pharmacy

## 2023-06-01 NOTE — Interval H&P Note (Signed)
History and Physical Interval Note:  06/01/2023 10:07 AM  [name]  has presented today for surgery, with the diagnosis of bacteremia.  The various methods of treatment have been discussed with the patient and family. After consideration of risks, benefits and other options for treatment, the patient has consented to  Procedure(s): TRANSESOPHAGEAL ECHOCARDIOGRAM (N/A) as a surgical intervention.  The patient's history has been reviewed, patient examined, no change in status, stable for surgery.  I have reviewed the patient's chart and labs.  Questions were answered to the patient's satisfaction.     Dietrich Pates

## 2023-06-01 NOTE — Interval H&P Note (Signed)
History and Physical Interval Note:  06/01/2023 10:20 AM  [name]  has presented today for surgery, with the diagnosis of bacteremia.  The various methods of treatment have been discussed with the patient and family. After consideration of risks, benefits and other options for treatment, the patient has consented to  Procedure(s): TRANSESOPHAGEAL ECHOCARDIOGRAM (N/A) as a surgical intervention.  The patient's history has been reviewed, patient examined, no change in status, stable for surgery.  I have reviewed the patient's chart and labs.  Questions were answered to the patient's satisfaction.     Dietrich Pates

## 2023-06-01 NOTE — Care Management Important Message (Signed)
Important Message  Patient Details  Name: [name] MRN: [id number] Date of Birth: Jun 04, 1932   Important Message Given:  Yes - Medicare IM     Dorena Bodo 06/01/2023, 2:28 PM

## 2023-06-01 NOTE — Discharge Summary (Signed)
ECHOCARDIOGRAM REPORT   Patient Name:   [name] Date of Exam: 05/28/2023 Medical Rec #:  [id number]        Height:       74.0 in Accession #:    [id number]       Weight:       184.0 lb Date of Birth:  07/10/1932        BSA:          2.098 m Patient Age:    89 years         BP:           120/60 mmHg Patient Gender: M                HR:           70 bpm. Exam Location:  Inpatient Procedure: 2D Echo, Cardiac Doppler and Color Doppler Indications:    Bacteremia R78.81  History:        Patient has prior history of Echocardiogram examinations, most                 recent 04/08/2019. Arrythmias:Atrial Fibrillation; Risk                 Factors:Hypertension, Dyslipidemia and Former Smoker.  Sonographer:    Dondra Prader RVT RCS Referring Phys: Dow Adolph, N IMPRESSIONS  1. Left ventricular ejection fraction, by estimation, is 55 to 60%. The left ventricle has normal function. The left ventricle has no regional wall motion abnormalities. Left ventricular diastolic parameters were normal.  2. Right ventricular systolic function is normal. The right ventricular size is normal.  3. The mitral valve is normal in structure. Trivial mitral valve regurgitation. No evidence of mitral stenosis.  4. The aortic valve is tricuspid. There is mild calcification of the aortic valve. Aortic valve regurgitation is not visualized. No aortic stenosis is present.  5. The inferior vena cava is normal in size with greater than 50% respiratory variability, suggesting right atrial pressure of 3 mmHg. FINDINGS  Left Ventricle: Left ventricular ejection  fraction, by estimation, is 55 to 60%. The left ventricle has normal function. The left ventricle has no regional wall motion abnormalities. The left ventricular internal cavity size was normal in size. There is  no left ventricular hypertrophy. Left ventricular diastolic parameters were normal. Right Ventricle: The right ventricular size is normal. No increase in right ventricular wall thickness. Right ventricular systolic function is normal. Left Atrium: Left atrial size was normal in size. Right Atrium: Right atrial size was normal in size. Pericardium: There is no evidence of pericardial effusion. Mitral Valve: The mitral valve is normal in structure. Trivial mitral valve regurgitation. No evidence of mitral valve stenosis. Tricuspid Valve: The tricuspid valve is normal in structure. Tricuspid valve regurgitation is trivial. No evidence of tricuspid stenosis. Aortic Valve: The aortic valve is tricuspid. There is mild calcification of the aortic valve. Aortic valve regurgitation is not visualized. No aortic stenosis is present. Aortic valve mean gradient measures 2.0 mmHg. Aortic valve peak gradient measures 3.6 mmHg. Aortic valve area, by VTI measures 3.31 cm. Pulmonic Valve: The pulmonic valve was normal in structure. Pulmonic valve regurgitation is trivial. No evidence of pulmonic stenosis. Aorta: The aortic root is normal in size and structure. Venous: The inferior vena cava is normal in size with greater than 50% respiratory variability, suggesting right atrial pressure of 3 mmHg. IAS/Shunts: No atrial level shunt detected by color flow Doppler.  LEFT VENTRICLE PLAX 2D LVIDd:  ECHOCARDIOGRAM REPORT   Patient Name:   [name] Date of Exam: 05/28/2023 Medical Rec #:  [id number]        Height:       74.0 in Accession #:    [id number]       Weight:       184.0 lb Date of Birth:  07/10/1932        BSA:          2.098 m Patient Age:    89 years         BP:           120/60 mmHg Patient Gender: M                HR:           70 bpm. Exam Location:  Inpatient Procedure: 2D Echo, Cardiac Doppler and Color Doppler Indications:    Bacteremia R78.81  History:        Patient has prior history of Echocardiogram examinations, most                 recent 04/08/2019. Arrythmias:Atrial Fibrillation; Risk                 Factors:Hypertension, Dyslipidemia and Former Smoker.  Sonographer:    Dondra Prader RVT RCS Referring Phys: Dow Adolph, N IMPRESSIONS  1. Left ventricular ejection fraction, by estimation, is 55 to 60%. The left ventricle has normal function. The left ventricle has no regional wall motion abnormalities. Left ventricular diastolic parameters were normal.  2. Right ventricular systolic function is normal. The right ventricular size is normal.  3. The mitral valve is normal in structure. Trivial mitral valve regurgitation. No evidence of mitral stenosis.  4. The aortic valve is tricuspid. There is mild calcification of the aortic valve. Aortic valve regurgitation is not visualized. No aortic stenosis is present.  5. The inferior vena cava is normal in size with greater than 50% respiratory variability, suggesting right atrial pressure of 3 mmHg. FINDINGS  Left Ventricle: Left ventricular ejection  fraction, by estimation, is 55 to 60%. The left ventricle has normal function. The left ventricle has no regional wall motion abnormalities. The left ventricular internal cavity size was normal in size. There is  no left ventricular hypertrophy. Left ventricular diastolic parameters were normal. Right Ventricle: The right ventricular size is normal. No increase in right ventricular wall thickness. Right ventricular systolic function is normal. Left Atrium: Left atrial size was normal in size. Right Atrium: Right atrial size was normal in size. Pericardium: There is no evidence of pericardial effusion. Mitral Valve: The mitral valve is normal in structure. Trivial mitral valve regurgitation. No evidence of mitral valve stenosis. Tricuspid Valve: The tricuspid valve is normal in structure. Tricuspid valve regurgitation is trivial. No evidence of tricuspid stenosis. Aortic Valve: The aortic valve is tricuspid. There is mild calcification of the aortic valve. Aortic valve regurgitation is not visualized. No aortic stenosis is present. Aortic valve mean gradient measures 2.0 mmHg. Aortic valve peak gradient measures 3.6 mmHg. Aortic valve area, by VTI measures 3.31 cm. Pulmonic Valve: The pulmonic valve was normal in structure. Pulmonic valve regurgitation is trivial. No evidence of pulmonic stenosis. Aorta: The aortic root is normal in size and structure. Venous: The inferior vena cava is normal in size with greater than 50% respiratory variability, suggesting right atrial pressure of 3 mmHg. IAS/Shunts: No atrial level shunt detected by color flow Doppler.  LEFT VENTRICLE PLAX 2D LVIDd:  Physician Discharge Summary  MACKEY VARRICCHIO QQV:[id number] DOB: 11-Sep-1931 DOA: 05/27/2023  PCP: Kristian Covey, MD  Admit date: 05/27/2023 Discharge date: 06/01/2023 Admitted From: Home Disposition: Home Recommendations for Outpatient Follow-up:  Outpatient follow-up with PCP in 1 week ID to arrange outpatient follow-up Check CBC with differential, BMP, CRP and ESR weekly Please follow up on the following pending results: None  Home Health: HH RN Equipment/Devices: Home infusion for IV antibiotics  Discharge Condition: Stable CODE STATUS: Full code  Follow-up Information     Kristian Covey, MD. Schedule an appointment as soon as possible for a visit in 1 week(s).   Specialty: Family Medicine Contact information: [address] [place] [address] [phone]                 Hospital course 89 year old M with PMH of paroxysmal A-fib on Eliquis, HTN, CKD-3B and recent hospitalization for CAP returning to the hospital a day after discharge due to blood culture showing MSSA bacteremia.  He was hemodynamically stable.  Labs without leukocytosis or significant finding.  Blood cultures drawn.  He was initially started on IV ceftriaxone and doxycycline, and continued on IV Ancef and azithromycin.    Repeat blood culture NGTD.   TTE and TEE without vegetation or significant finding.  PICC line placed on 10/4.  Cleared for discharge on IV Ancef until 06/24/2023 (total of 4 weeks from negative blood culture).  Weekly labs as above.  ID to arrange outpatient follow-up  See individual problem list below for more.   Problems addressed during this hospitalization Principal Problem:   MSSA bacteremia   MSSA bacteremia: POA.  Pulmonary source?  Has no ulcers.  Patient appears well.  Hemodynamically stable.  No fever or leukocytosis.  Lactic acid within normal.  Repeat blood culture on 10/2 NGTD.  TTE and TEE without significant finding of vegetation.  PICC line  placed on 10/4.  Discharged on IV Ancef until 06/24/2023.   Community-acquired pneumonia: POA.  No significant respiratory distress. -IV Ancef as above.  Completed course for atypical coverage.   Paroxysmal A-fib on Eliquis: Rate controlled. -Continue home Eliquis.  Renal function is borderline.  Anticipate improvement -Toprol-XL to 25 mg daily.  Was on 12.5 mg daily while hospitalized.  Was on 50 mg before admission.   Hyperglycemia: No history of diabetes.  A1c 6.1%.  Not on meds.   Essential hypertension: BP slightly elevated this morning.  On amlodipine, Toprol XL and Lasix at home.  -Continue home amlodipine -Toprol-XL as above -Continue home Lasix as needed  Hyperlipidemia -Continue home statin   BPH with urinary hesitancy -Continue Proscar and Flomax   Constipation: Resolved   GERD -Continue PPI   Physical deconditioning-evaluated by therapy.  No need identified.   Chronic back pain: No acute change.  Symmetric.  Low suspicion for infection. -Continue home Tylenol and gabapentin           Time spent 35 minutes  Vital signs Vitals:   06/01/23 1045 06/01/23 1050 06/01/23 1054 06/01/23 1209  BP: (!) 147/62 (!) 143/65  (!) 144/64  Pulse: 78 72 68 74  Temp:  (!) 97.1 F (36.2 C)    Resp: 18 14 17    Height:      Weight:      SpO2: 98% 96% 98% 99%  TempSrc:  Temporal    BMI (Calculated):         Discharge exam  GENERAL: No apparent distress.  Nontoxic. HEENT: MMM.  ECHOCARDIOGRAM REPORT   Patient Name:   [name] Date of Exam: 05/28/2023 Medical Rec #:  [id number]        Height:       74.0 in Accession #:    [id number]       Weight:       184.0 lb Date of Birth:  07/10/1932        BSA:          2.098 m Patient Age:    89 years         BP:           120/60 mmHg Patient Gender: M                HR:           70 bpm. Exam Location:  Inpatient Procedure: 2D Echo, Cardiac Doppler and Color Doppler Indications:    Bacteremia R78.81  History:        Patient has prior history of Echocardiogram examinations, most                 recent 04/08/2019. Arrythmias:Atrial Fibrillation; Risk                 Factors:Hypertension, Dyslipidemia and Former Smoker.  Sonographer:    Dondra Prader RVT RCS Referring Phys: Dow Adolph, N IMPRESSIONS  1. Left ventricular ejection fraction, by estimation, is 55 to 60%. The left ventricle has normal function. The left ventricle has no regional wall motion abnormalities. Left ventricular diastolic parameters were normal.  2. Right ventricular systolic function is normal. The right ventricular size is normal.  3. The mitral valve is normal in structure. Trivial mitral valve regurgitation. No evidence of mitral stenosis.  4. The aortic valve is tricuspid. There is mild calcification of the aortic valve. Aortic valve regurgitation is not visualized. No aortic stenosis is present.  5. The inferior vena cava is normal in size with greater than 50% respiratory variability, suggesting right atrial pressure of 3 mmHg. FINDINGS  Left Ventricle: Left ventricular ejection  fraction, by estimation, is 55 to 60%. The left ventricle has normal function. The left ventricle has no regional wall motion abnormalities. The left ventricular internal cavity size was normal in size. There is  no left ventricular hypertrophy. Left ventricular diastolic parameters were normal. Right Ventricle: The right ventricular size is normal. No increase in right ventricular wall thickness. Right ventricular systolic function is normal. Left Atrium: Left atrial size was normal in size. Right Atrium: Right atrial size was normal in size. Pericardium: There is no evidence of pericardial effusion. Mitral Valve: The mitral valve is normal in structure. Trivial mitral valve regurgitation. No evidence of mitral valve stenosis. Tricuspid Valve: The tricuspid valve is normal in structure. Tricuspid valve regurgitation is trivial. No evidence of tricuspid stenosis. Aortic Valve: The aortic valve is tricuspid. There is mild calcification of the aortic valve. Aortic valve regurgitation is not visualized. No aortic stenosis is present. Aortic valve mean gradient measures 2.0 mmHg. Aortic valve peak gradient measures 3.6 mmHg. Aortic valve area, by VTI measures 3.31 cm. Pulmonic Valve: The pulmonic valve was normal in structure. Pulmonic valve regurgitation is trivial. No evidence of pulmonic stenosis. Aorta: The aortic root is normal in size and structure. Venous: The inferior vena cava is normal in size with greater than 50% respiratory variability, suggesting right atrial pressure of 3 mmHg. IAS/Shunts: No atrial level shunt detected by color flow Doppler.  LEFT VENTRICLE PLAX 2D LVIDd:  ECHOCARDIOGRAM REPORT   Patient Name:   [name] Date of Exam: 05/28/2023 Medical Rec #:  [id number]        Height:       74.0 in Accession #:    [id number]       Weight:       184.0 lb Date of Birth:  07/10/1932        BSA:          2.098 m Patient Age:    89 years         BP:           120/60 mmHg Patient Gender: M                HR:           70 bpm. Exam Location:  Inpatient Procedure: 2D Echo, Cardiac Doppler and Color Doppler Indications:    Bacteremia R78.81  History:        Patient has prior history of Echocardiogram examinations, most                 recent 04/08/2019. Arrythmias:Atrial Fibrillation; Risk                 Factors:Hypertension, Dyslipidemia and Former Smoker.  Sonographer:    Dondra Prader RVT RCS Referring Phys: Dow Adolph, N IMPRESSIONS  1. Left ventricular ejection fraction, by estimation, is 55 to 60%. The left ventricle has normal function. The left ventricle has no regional wall motion abnormalities. Left ventricular diastolic parameters were normal.  2. Right ventricular systolic function is normal. The right ventricular size is normal.  3. The mitral valve is normal in structure. Trivial mitral valve regurgitation. No evidence of mitral stenosis.  4. The aortic valve is tricuspid. There is mild calcification of the aortic valve. Aortic valve regurgitation is not visualized. No aortic stenosis is present.  5. The inferior vena cava is normal in size with greater than 50% respiratory variability, suggesting right atrial pressure of 3 mmHg. FINDINGS  Left Ventricle: Left ventricular ejection  fraction, by estimation, is 55 to 60%. The left ventricle has normal function. The left ventricle has no regional wall motion abnormalities. The left ventricular internal cavity size was normal in size. There is  no left ventricular hypertrophy. Left ventricular diastolic parameters were normal. Right Ventricle: The right ventricular size is normal. No increase in right ventricular wall thickness. Right ventricular systolic function is normal. Left Atrium: Left atrial size was normal in size. Right Atrium: Right atrial size was normal in size. Pericardium: There is no evidence of pericardial effusion. Mitral Valve: The mitral valve is normal in structure. Trivial mitral valve regurgitation. No evidence of mitral valve stenosis. Tricuspid Valve: The tricuspid valve is normal in structure. Tricuspid valve regurgitation is trivial. No evidence of tricuspid stenosis. Aortic Valve: The aortic valve is tricuspid. There is mild calcification of the aortic valve. Aortic valve regurgitation is not visualized. No aortic stenosis is present. Aortic valve mean gradient measures 2.0 mmHg. Aortic valve peak gradient measures 3.6 mmHg. Aortic valve area, by VTI measures 3.31 cm. Pulmonic Valve: The pulmonic valve was normal in structure. Pulmonic valve regurgitation is trivial. No evidence of pulmonic stenosis. Aorta: The aortic root is normal in size and structure. Venous: The inferior vena cava is normal in size with greater than 50% respiratory variability, suggesting right atrial pressure of 3 mmHg. IAS/Shunts: No atrial level shunt detected by color flow Doppler.  LEFT VENTRICLE PLAX 2D LVIDd:  ECHOCARDIOGRAM REPORT   Patient Name:   [name] Date of Exam: 05/28/2023 Medical Rec #:  [id number]        Height:       74.0 in Accession #:    [id number]       Weight:       184.0 lb Date of Birth:  07/10/1932        BSA:          2.098 m Patient Age:    89 years         BP:           120/60 mmHg Patient Gender: M                HR:           70 bpm. Exam Location:  Inpatient Procedure: 2D Echo, Cardiac Doppler and Color Doppler Indications:    Bacteremia R78.81  History:        Patient has prior history of Echocardiogram examinations, most                 recent 04/08/2019. Arrythmias:Atrial Fibrillation; Risk                 Factors:Hypertension, Dyslipidemia and Former Smoker.  Sonographer:    Dondra Prader RVT RCS Referring Phys: Dow Adolph, N IMPRESSIONS  1. Left ventricular ejection fraction, by estimation, is 55 to 60%. The left ventricle has normal function. The left ventricle has no regional wall motion abnormalities. Left ventricular diastolic parameters were normal.  2. Right ventricular systolic function is normal. The right ventricular size is normal.  3. The mitral valve is normal in structure. Trivial mitral valve regurgitation. No evidence of mitral stenosis.  4. The aortic valve is tricuspid. There is mild calcification of the aortic valve. Aortic valve regurgitation is not visualized. No aortic stenosis is present.  5. The inferior vena cava is normal in size with greater than 50% respiratory variability, suggesting right atrial pressure of 3 mmHg. FINDINGS  Left Ventricle: Left ventricular ejection  fraction, by estimation, is 55 to 60%. The left ventricle has normal function. The left ventricle has no regional wall motion abnormalities. The left ventricular internal cavity size was normal in size. There is  no left ventricular hypertrophy. Left ventricular diastolic parameters were normal. Right Ventricle: The right ventricular size is normal. No increase in right ventricular wall thickness. Right ventricular systolic function is normal. Left Atrium: Left atrial size was normal in size. Right Atrium: Right atrial size was normal in size. Pericardium: There is no evidence of pericardial effusion. Mitral Valve: The mitral valve is normal in structure. Trivial mitral valve regurgitation. No evidence of mitral valve stenosis. Tricuspid Valve: The tricuspid valve is normal in structure. Tricuspid valve regurgitation is trivial. No evidence of tricuspid stenosis. Aortic Valve: The aortic valve is tricuspid. There is mild calcification of the aortic valve. Aortic valve regurgitation is not visualized. No aortic stenosis is present. Aortic valve mean gradient measures 2.0 mmHg. Aortic valve peak gradient measures 3.6 mmHg. Aortic valve area, by VTI measures 3.31 cm. Pulmonic Valve: The pulmonic valve was normal in structure. Pulmonic valve regurgitation is trivial. No evidence of pulmonic stenosis. Aorta: The aortic root is normal in size and structure. Venous: The inferior vena cava is normal in size with greater than 50% respiratory variability, suggesting right atrial pressure of 3 mmHg. IAS/Shunts: No atrial level shunt detected by color flow Doppler.  LEFT VENTRICLE PLAX 2D LVIDd:  ECHOCARDIOGRAM REPORT   Patient Name:   [name] Date of Exam: 05/28/2023 Medical Rec #:  [id number]        Height:       74.0 in Accession #:    [id number]       Weight:       184.0 lb Date of Birth:  07/10/1932        BSA:          2.098 m Patient Age:    89 years         BP:           120/60 mmHg Patient Gender: M                HR:           70 bpm. Exam Location:  Inpatient Procedure: 2D Echo, Cardiac Doppler and Color Doppler Indications:    Bacteremia R78.81  History:        Patient has prior history of Echocardiogram examinations, most                 recent 04/08/2019. Arrythmias:Atrial Fibrillation; Risk                 Factors:Hypertension, Dyslipidemia and Former Smoker.  Sonographer:    Dondra Prader RVT RCS Referring Phys: Dow Adolph, N IMPRESSIONS  1. Left ventricular ejection fraction, by estimation, is 55 to 60%. The left ventricle has normal function. The left ventricle has no regional wall motion abnormalities. Left ventricular diastolic parameters were normal.  2. Right ventricular systolic function is normal. The right ventricular size is normal.  3. The mitral valve is normal in structure. Trivial mitral valve regurgitation. No evidence of mitral stenosis.  4. The aortic valve is tricuspid. There is mild calcification of the aortic valve. Aortic valve regurgitation is not visualized. No aortic stenosis is present.  5. The inferior vena cava is normal in size with greater than 50% respiratory variability, suggesting right atrial pressure of 3 mmHg. FINDINGS  Left Ventricle: Left ventricular ejection  fraction, by estimation, is 55 to 60%. The left ventricle has normal function. The left ventricle has no regional wall motion abnormalities. The left ventricular internal cavity size was normal in size. There is  no left ventricular hypertrophy. Left ventricular diastolic parameters were normal. Right Ventricle: The right ventricular size is normal. No increase in right ventricular wall thickness. Right ventricular systolic function is normal. Left Atrium: Left atrial size was normal in size. Right Atrium: Right atrial size was normal in size. Pericardium: There is no evidence of pericardial effusion. Mitral Valve: The mitral valve is normal in structure. Trivial mitral valve regurgitation. No evidence of mitral valve stenosis. Tricuspid Valve: The tricuspid valve is normal in structure. Tricuspid valve regurgitation is trivial. No evidence of tricuspid stenosis. Aortic Valve: The aortic valve is tricuspid. There is mild calcification of the aortic valve. Aortic valve regurgitation is not visualized. No aortic stenosis is present. Aortic valve mean gradient measures 2.0 mmHg. Aortic valve peak gradient measures 3.6 mmHg. Aortic valve area, by VTI measures 3.31 cm. Pulmonic Valve: The pulmonic valve was normal in structure. Pulmonic valve regurgitation is trivial. No evidence of pulmonic stenosis. Aorta: The aortic root is normal in size and structure. Venous: The inferior vena cava is normal in size with greater than 50% respiratory variability, suggesting right atrial pressure of 3 mmHg. IAS/Shunts: No atrial level shunt detected by color flow Doppler.  LEFT VENTRICLE PLAX 2D LVIDd:  Physician Discharge Summary  MACKEY VARRICCHIO QQV:[id number] DOB: 11-Sep-1931 DOA: 05/27/2023  PCP: Kristian Covey, MD  Admit date: 05/27/2023 Discharge date: 06/01/2023 Admitted From: Home Disposition: Home Recommendations for Outpatient Follow-up:  Outpatient follow-up with PCP in 1 week ID to arrange outpatient follow-up Check CBC with differential, BMP, CRP and ESR weekly Please follow up on the following pending results: None  Home Health: HH RN Equipment/Devices: Home infusion for IV antibiotics  Discharge Condition: Stable CODE STATUS: Full code  Follow-up Information     Kristian Covey, MD. Schedule an appointment as soon as possible for a visit in 1 week(s).   Specialty: Family Medicine Contact information: [address] [place] [address] [phone]                 Hospital course 89 year old M with PMH of paroxysmal A-fib on Eliquis, HTN, CKD-3B and recent hospitalization for CAP returning to the hospital a day after discharge due to blood culture showing MSSA bacteremia.  He was hemodynamically stable.  Labs without leukocytosis or significant finding.  Blood cultures drawn.  He was initially started on IV ceftriaxone and doxycycline, and continued on IV Ancef and azithromycin.    Repeat blood culture NGTD.   TTE and TEE without vegetation or significant finding.  PICC line placed on 10/4.  Cleared for discharge on IV Ancef until 06/24/2023 (total of 4 weeks from negative blood culture).  Weekly labs as above.  ID to arrange outpatient follow-up  See individual problem list below for more.   Problems addressed during this hospitalization Principal Problem:   MSSA bacteremia   MSSA bacteremia: POA.  Pulmonary source?  Has no ulcers.  Patient appears well.  Hemodynamically stable.  No fever or leukocytosis.  Lactic acid within normal.  Repeat blood culture on 10/2 NGTD.  TTE and TEE without significant finding of vegetation.  PICC line  placed on 10/4.  Discharged on IV Ancef until 06/24/2023.   Community-acquired pneumonia: POA.  No significant respiratory distress. -IV Ancef as above.  Completed course for atypical coverage.   Paroxysmal A-fib on Eliquis: Rate controlled. -Continue home Eliquis.  Renal function is borderline.  Anticipate improvement -Toprol-XL to 25 mg daily.  Was on 12.5 mg daily while hospitalized.  Was on 50 mg before admission.   Hyperglycemia: No history of diabetes.  A1c 6.1%.  Not on meds.   Essential hypertension: BP slightly elevated this morning.  On amlodipine, Toprol XL and Lasix at home.  -Continue home amlodipine -Toprol-XL as above -Continue home Lasix as needed  Hyperlipidemia -Continue home statin   BPH with urinary hesitancy -Continue Proscar and Flomax   Constipation: Resolved   GERD -Continue PPI   Physical deconditioning-evaluated by therapy.  No need identified.   Chronic back pain: No acute change.  Symmetric.  Low suspicion for infection. -Continue home Tylenol and gabapentin           Time spent 35 minutes  Vital signs Vitals:   06/01/23 1045 06/01/23 1050 06/01/23 1054 06/01/23 1209  BP: (!) 147/62 (!) 143/65  (!) 144/64  Pulse: 78 72 68 74  Temp:  (!) 97.1 F (36.2 C)    Resp: 18 14 17    Height:      Weight:      SpO2: 98% 96% 98% 99%  TempSrc:  Temporal    BMI (Calculated):         Discharge exam  GENERAL: No apparent distress.  Nontoxic. HEENT: MMM.

## 2023-06-01 NOTE — Progress Notes (Signed)
Occupational Therapy Treatment Patient Details Name: [name] MRN: [id number] DOB: [birth year] Today's Date: 06/01/2023   History of present illness 89 y.o. male recently hospitalized for CAP  discharged on 10/2 who returned to [facility] hospital on the same day when blood cultures were positive for MSSA. with PHM of Afib on eliquis, CKD3b, HTN.   OT comments  Pt seen s/p TEE. Wife present for session. Pt mobilizing well short distances @ RW level due to back pain. Educated pt/wife on strategies to reduce risk of falls. Issued gait belt. Plan is to DC home with assistance of wife and PCA. No further OT needed at this time.       If plan is discharge home, recommend the following:  A little help with bathing/dressing/bathroom   Equipment Recommendations  None recommended by OT    Recommendations for Other Services      Precautions / Restrictions Precautions Precautions: Fall       Mobility Bed Mobility               General bed mobility comments: OOB in chair    Transfers Overall transfer level: Needs assistance Equipment used: Rolling walker (2 wheels) Transfers: Sit to/from Stand Sit to Stand: Supervision                 Balance     Sitting balance-Leahy Scale: Good       Standing balance-Leahy Scale: Fair                             ADL either performed or assessed with clinical judgement   ADL                                       Functional mobility during ADLs: Contact guard assist;Rolling walker (2 wheels) General ADL Comments: overall CGA for ADL tasks; educated pt/wife on strategies to reduce risk of falls; Pt has PCA 3 days/wk; issued gait belt    Extremity/Trunk Assessment Upper Extremity Assessment Upper Extremity Assessment: Generalized weakness   Lower Extremity Assessment Lower Extremity Assessment: Defer to PT evaluation        Vision       Perception     Praxis      Cognition Arousal:  Alert Behavior During Therapy: WFL for tasks assessed/performed Overall Cognitive Status:  (most likely baseline)                                          Exercises      Shoulder Instructions       General Comments Educated on completing more frequent walks with PCA/assistance    Pertinent Vitals/ Pain       Pain Assessment Pain Assessment: Faces Faces Pain Scale: Hurts little more Pain Location: back (chronic) Pain Descriptors / Indicators: Discomfort Pain Intervention(s): Limited activity within patient's tolerance  Home Living                                          Prior Functioning/Environment              Frequency  Progress Toward Goals  OT Goals(current goals can now be found in the care plan section)  Progress towards OT goals: Goals met/education completed, patient discharged from OT (adequate for DC)  Acute Rehab OT Goals Patient Stated Goal: home today OT Goal Formulation: All assessment and education complete, DC therapy ADL Goals Pt Will Perform Grooming: with supervision;standing Pt Will Perform Lower Body Bathing: with modified independence;sitting/lateral leans Pt Will Transfer to Toilet: with supervision;ambulating Pt Will Perform Toileting - Clothing Manipulation and hygiene: with supervision;sit to/from stand  Plan      Co-evaluation                 AM-PAC OT "6 Clicks" Daily Activity     Outcome Measure   Help from another person eating meals?: None Help from another person taking care of personal grooming?: A Little Help from another person toileting, which includes using toliet, bedpan, or urinal?: A Little Help from another person bathing (including washing, rinsing, drying)?: A Little Help from another person to put on and taking off regular upper body clothing?: A Little Help from another person to put on and taking off regular lower body clothing?: A Little 6 Click Score:  19    End of Session Equipment Utilized During Treatment: Gait belt;Rolling walker (2 wheels)  OT Visit Diagnosis: Unsteadiness on feet (R26.81)   Activity Tolerance Patient tolerated treatment well   Patient Left in chair;with call bell/phone within reach;with chair alarm set;with family/visitor present   Nurse Communication Mobility status        Time: 1203-1218 OT Time Calculation (min): 15 min  Charges: OT General Charges $OT Visit: 1 Visit OT Treatments $Self Care/Home Management : 8-22 mins  Luisa Dago, OT/L   Acute OT Clinical Specialist Acute Rehabilitation Services Pager [phone] Office [phone]   [facility] 06/01/2023, 12:52 PM

## 2023-06-01 NOTE — Progress Notes (Signed)
PHARMACY CONSULT NOTE FOR:  OUTPATIENT  PARENTERAL ANTIBIOTIC THERAPY (OPAT)  Indication: MSSA bacteremia Regimen: Cefazolin 2g IV every 8 hours End date: 06/24/23 (4 weeks from neg BCx 05/27/23)  IV antibiotic discharge orders are pended. To discharging provider:  please sign these orders via discharge navigator,  Select New Orders & click on the button choice - Manage This Unsigned Work.     Thank you for allowing pharmacy to be a part of this patient's care.  Georgina Pillion, PharmD, BCPS, BCIDP Infectious Diseases Clinical Pharmacist 06/01/2023 12:00 PM   **Pharmacist phone directory can now be found on [email or web address] ([email or web address]).  Listed under [facility].

## 2023-06-01 NOTE — Interval H&P Note (Signed)
History and Physical Interval Note:  06/01/2023 9:38 AM  [name]  has presented today for surgery, with the diagnosis of bacteremia.  The various methods of treatment have been discussed with the patient and family. After consideration of risks, benefits and other options for treatment, the patient has consented to  Procedure(s): TRANSESOPHAGEAL ECHOCARDIOGRAM (N/A) as a surgical intervention.  The patient's history has been reviewed, patient examined, no change in status, stable for surgery.  I have reviewed the patient's chart and labs.  Questions were answered to the patient's satisfaction.     Dietrich Pates

## 2023-06-01 NOTE — CV Procedure (Signed)
TEE  Indication:  Bacteremia   Patient sedated by anesthesia with propofol intravenously Bite guard placed in mouth and secured TEE probe advanced through mouth to mid esophagus without difficulty   MV normal TV normal  PV normal AV normal  LVEF and RVEF normal  No PFO by color doppler or as tested with injection of agitated saline   LA, LAA without masses  Minimal plaquing in thoracic aorta  Procedure was without complication  Full report to follow in CV section of chart   Dietrich Pates MD

## 2023-06-01 NOTE — Progress Notes (Signed)
OT Cancellation Note  Patient Details Name: [name] MRN: [id number] DOB: 12-16-1931   Cancelled Treatment:    Reason Eval/Treat Not Completed: Patient at procedure or test/ unavailable (TEE. Will follow up later time)  [facility] 06/01/2023, 11:43 AM Luisa Dago, OT/L   Acute OT Clinical Specialist Acute Rehabilitation Services Pager [phone] Office [phone]

## 2023-06-01 NOTE — Progress Notes (Signed)
PT Cancellation Note  Patient Details Name: ALCARIO TINKEY MRN: [id number] DOB: Mar 02, 1932   Cancelled Treatment:    Reason Eval/Treat Not Completed: Patient at procedure or test/unavailable (PT will continue with attempts)  Donna Bernard, PT, MPT  In[facility] 06/01/2023, 3:01 PM

## 2023-06-01 NOTE — Anesthesia Preprocedure Evaluation (Signed)
Anesthesia Evaluation  Patient identified by MRN, date of birth, ID band Patient awake    Reviewed: Allergy & Precautions, NPO status , Patient's Chart, lab work & pertinent test results  History of Anesthesia Complications Negative for: history of anesthetic complications  Airway Mallampati: III  TM Distance: >3 FB Neck ROM: Full    Dental  (+) Upper Dentures, Lower Dentures   Pulmonary pneumonia, resolved, former smoker   breath sounds clear to auscultation       Cardiovascular hypertension, Pt. on medications and Pt. on home beta blockers (-) angina (-) Past MI and (-) CHF + dysrhythmias Atrial Fibrillation  Rhythm:Regular  1. Left ventricular ejection fraction, by estimation, is 55 to 60%. The  left ventricle has normal function. The left ventricle has no regional  wall motion abnormalities. Left ventricular diastolic parameters were  normal.   2. Right ventricular systolic function is normal. The right ventricular  size is normal.   3. The mitral valve is normal in structure. Trivial mitral valve  regurgitation. No evidence of mitral stenosis.   4. The aortic valve is tricuspid. There is mild calcification of the  aortic valve. Aortic valve regurgitation is not visualized. No aortic  stenosis is present.   5. The inferior vena cava is normal in size with greater than 50%  respiratory variability, suggesting right atrial pressure of 3 mmHg.     Neuro/Psych neg Seizures  Neuromuscular disease    GI/Hepatic Neg liver ROS,GERD  ,,  Endo/Other  negative endocrine ROS    Renal/GU Renal InsufficiencyRenal diseaseLab Results      Component                Value               Date                      NA                       140                 06/01/2023                K                        3.5                 06/01/2023                CO2                      28                  06/01/2023                GLUCOSE                   115 (H)             06/01/2023                BUN                      24 (H)              06/01/2023                CREATININE  1.52 (H)            06/01/2023                CALCIUM                  8.4 (L)             06/01/2023                GFR                      47.58 (L)           02/03/2023                EGFR                     49 (L)              05/27/2022                GFRNONAA                 44 (L)              06/01/2023                Musculoskeletal  (+) Arthritis ,    Abdominal   Peds  Hematology  (+) Blood dyscrasia, anemia Lab Results      Component                Value               Date                      WBC                      5.6                 06/01/2023                HGB                      9.9 (L)             06/01/2023                HCT                      31.5 (L)            06/01/2023                MCV                      99.7                06/01/2023                PLT                      181                 06/01/2023             eliquis   Anesthesia Other Findings   Reproductive/Obstetrics  Anesthesia Physical Anesthesia Plan  ASA: 3  Anesthesia Plan: MAC   Post-op Pain Management: Minimal or no pain anticipated   Induction: Intravenous  PONV Risk Score and Plan: 1 and Propofol infusion and Treatment may vary due to age or medical condition  Airway Management Planned: Nasal Cannula, Natural Airway and Simple Face Mask  Additional Equipment: None  Intra-op Plan:   Post-operative Plan:   Informed Consent: I have reviewed the patients History and Physical, chart, labs and discussed the procedure including the risks, benefits and alternatives for the proposed anesthesia with the patient or authorized representative who has indicated his/her understanding and acceptance.     Dental advisory given  Plan Discussed with: CRNA  Anesthesia  Plan Comments:          Anesthesia Quick Evaluation

## 2023-06-02 ENCOUNTER — Encounter (HOSPITAL_COMMUNITY): Payer: Self-pay | Admitting: Internal Medicine

## 2023-06-02 DIAGNOSIS — R7881 Bacteremia: Secondary | ICD-10-CM | POA: Diagnosis not present

## 2023-06-02 DIAGNOSIS — Z85828 Personal history of other malignant neoplasm of skin: Secondary | ICD-10-CM | POA: Diagnosis not present

## 2023-06-02 DIAGNOSIS — A4101 Sepsis due to Methicillin susceptible Staphylococcus aureus: Secondary | ICD-10-CM | POA: Diagnosis not present

## 2023-06-02 DIAGNOSIS — Z452 Encounter for adjustment and management of vascular access device: Secondary | ICD-10-CM | POA: Diagnosis not present

## 2023-06-02 DIAGNOSIS — Z87891 Personal history of nicotine dependence: Secondary | ICD-10-CM | POA: Diagnosis not present

## 2023-06-02 DIAGNOSIS — Z860100 Personal history of colon polyps, unspecified: Secondary | ICD-10-CM | POA: Diagnosis not present

## 2023-06-02 DIAGNOSIS — K5900 Constipation, unspecified: Secondary | ICD-10-CM | POA: Diagnosis not present

## 2023-06-02 DIAGNOSIS — B9561 Methicillin susceptible Staphylococcus aureus infection as the cause of diseases classified elsewhere: Secondary | ICD-10-CM | POA: Diagnosis not present

## 2023-06-02 DIAGNOSIS — Z792 Long term (current) use of antibiotics: Secondary | ICD-10-CM | POA: Diagnosis not present

## 2023-06-02 DIAGNOSIS — Z7901 Long term (current) use of anticoagulants: Secondary | ICD-10-CM | POA: Diagnosis not present

## 2023-06-02 DIAGNOSIS — Z9181 History of falling: Secondary | ICD-10-CM | POA: Diagnosis not present

## 2023-06-02 DIAGNOSIS — R3911 Hesitancy of micturition: Secondary | ICD-10-CM | POA: Diagnosis not present

## 2023-06-02 DIAGNOSIS — K219 Gastro-esophageal reflux disease without esophagitis: Secondary | ICD-10-CM | POA: Diagnosis not present

## 2023-06-02 DIAGNOSIS — Z556 Problems related to health literacy: Secondary | ICD-10-CM | POA: Diagnosis not present

## 2023-06-02 DIAGNOSIS — R911 Solitary pulmonary nodule: Secondary | ICD-10-CM | POA: Diagnosis not present

## 2023-06-02 MED ORDER — HEPARIN SOD (PORK) LOCK FLUSH 100 UNIT/ML IV SOLN
250.0000 [IU] | INTRAVENOUS | Status: AC | PRN
  Administered 2023-06-02: 250 [IU]

## 2023-06-02 NOTE — Anesthesia Postprocedure Evaluation (Signed)
Anesthesia Post Note  Patient: EXAVIER LINA  Procedure(s) Performed: TRANSESOPHAGEAL ECHOCARDIOGRAM     Patient location during evaluation: Cath Lab Anesthesia Type: General Level of consciousness: awake and alert Pain management: pain level controlled Vital Signs Assessment: post-procedure vital signs reviewed and stable Respiratory status: spontaneous breathing, nonlabored ventilation, respiratory function stable and patient connected to nasal cannula oxygen Cardiovascular status: blood pressure returned to baseline and stable Postop Assessment: no apparent nausea or vomiting Anesthetic complications: no   No notable events documented.  Last Vitals:  Vitals:   06/01/23 2125 06/02/23 0817  BP: (!) 156/65 137/67  Pulse: 79 83  Resp:    Temp: 36.6 C 36.7 C  SpO2: 100% 99%    Last Pain:  Vitals:   06/02/23 0850  TempSrc:   PainSc: 0-No pain                 Quaniya Damas

## 2023-06-02 NOTE — Plan of Care (Signed)
  Problem: Fluid Volume: Goal: Ability to maintain a balanced intake and output will improve Outcome: Completed/Met   Problem: Health Behavior/Discharge Planning: Goal: Ability to identify and utilize available resources and services will improve Outcome: Completed/Met Goal: Ability to manage health-related needs will improve Outcome: Completed/Met   Problem: Metabolic: Goal: Ability to maintain appropriate glucose levels will improve Outcome: Completed/Met   Problem: Nutritional: Goal: Maintenance of adequate nutrition will improve Outcome: Completed/Met Goal: Progress toward achieving an optimal weight will improve Outcome: Completed/Met   Problem: Skin Integrity: Goal: Risk for impaired skin integrity will decrease Outcome: Completed/Met   Problem: Tissue Perfusion: Goal: Adequacy of tissue perfusion will improve Outcome: Completed/Met   Problem: Education: Goal: Knowledge of General Education information will improve Description: Including pain rating scale, medication(s)/side effects and non-pharmacologic comfort measures Outcome: Completed/Met   Problem: Health Behavior/Discharge Planning: Goal: Ability to manage health-related needs will improve Outcome: Completed/Met   Problem: Clinical Measurements: Goal: Ability to maintain clinical measurements within normal limits will improve Outcome: Completed/Met Goal: Will remain free from infection Outcome: Completed/Met Goal: Diagnostic test results will improve Outcome: Completed/Met Goal: Respiratory complications will improve Outcome: Completed/Met Goal: Cardiovascular complication will be avoided Outcome: Completed/Met   Problem: Activity: Goal: Risk for activity intolerance will decrease Outcome: Completed/Met   Problem: Nutrition: Goal: Adequate nutrition will be maintained Outcome: Completed/Met   Problem: Coping: Goal: Level of anxiety will decrease Outcome: Completed/Met   Problem:  Elimination: Goal: Will not experience complications related to bowel motility Outcome: Completed/Met Goal: Will not experience complications related to urinary retention Outcome: Completed/Met   Problem: Pain Managment: Goal: General experience of comfort will improve Outcome: Completed/Met   Problem: Safety: Goal: Ability to remain free from injury will improve Outcome: Completed/Met   Problem: Skin Integrity: Goal: Risk for impaired skin integrity will decrease Outcome: Completed/Met

## 2023-06-02 NOTE — TOC Transition Note (Signed)
Transition of Care [facility]) - CM/SW Discharge Note   Patient Details  Name: [name] MRN: [id number] Date of Birth: 07-30-1932  [facility]) CM/SW Contact:  Tom-Johnson, Hershal Coria, RN Phone Number: 06/02/2023, 9:47 AM   Clinical Narrative:     Patient is scheduled for discharge today.  Readmission Risk Assessment done. Home health for IV infusion info, hospital f/u and discharge instructions on AVS. Wife, Eber Jones to transport at discharge.  No further TOC needs noted.          Final next level of care: Home w Home Health Services Barriers to Discharge: Barriers Resolved   Patient Goals and CMS Choice CMS Medicare.gov Compare Post Acute Care list provided to:: Patient Choice offered to / list presented to : Patient, Spouse  Discharge Placement                  Patient to be transferred to facility by: Wife Name of family member notified: [facility]    Discharge Plan and Services Additional resources added to the After Visit Summary for                  DME Arranged: N/A DME Agency: NA       HH Arranged: RN, IV Antibiotics HH Agency: [facility] Warehouse manager Specialty) Date HH Agency Contacted: 05/29/23 Time HH Agency Contacted: 1400 Representative spoke with at [facility] Agency: Fleet Contras at [employer] and Haywood Lasso at [employer]  Social Determinants of Health (SDOH) Interventions SDOH Screenings   Food Insecurity: No Food Insecurity (05/28/2023)  Housing: Low Risk  (05/28/2023)  Transportation Needs: No Transportation Needs (05/28/2023)  Utilities: Not At Risk (05/28/2023)  Depression (PHQ2-9): Low Risk  (11/18/2022)  Financial Resource Strain: Low Risk  (05/01/2022)  Physical Activity: Unknown (05/01/2022)  Social Connections: Socially Integrated (05/01/2022)  Stress: No Stress Concern Present (05/01/2022)  Tobacco Use: High Risk (05/27/2023)     Readmission Risk Interventions    05/29/2023    4:31 PM  Readmission Risk Prevention  Plan  Transportation Screening Complete  PCP or Specialist Appt within 5-7 Days Complete  Home Care Screening Complete  Medication Review (RN CM) Referral to Pharmacy

## 2023-06-02 NOTE — Discharge Summary (Signed)
Physician Discharge Summary  [name] GLO:[id number] DOB: 17-Dec-1931 DOA: 05/27/2023  PCP: Kristian Covey, MD  Admit date: 05/27/2023 Discharge date: 06/02/2023 Admitted From: Home Disposition: Home Recommendations for Outpatient Follow-up:  Outpatient follow-up with PCP in 1 week ID to arrange outpatient follow-up Check CBC with differential, BMP, CRP and ESR weekly Please follow up on the following pending results: None  Home Health: HH RN Equipment/Devices: Home infusion for IV antibiotics  Discharge Condition: Stable CODE STATUS: Full code  Follow-up Information     Kristian Covey, MD. Schedule an appointment as soon as possible for a visit in 1 week(s).   Specialty: Family Medicine Contact information: [address] [place] [address] [phone]         Jobe Gibbon, [facility] Follow up.   Specialty: Home Health Services Why: Someone will call you to schedule first RN visit. Contact information: [address] [place] [address] [phone]         [facility] Follow up.   Why: Someone will call you to scheule first infusion visit. Contact information: [address] [place], [place] [address]  [phone]                Hospital course 89 year old M with PMH of paroxysmal A-fib on Eliquis, HTN, CKD-3B and recent hospitalization for CAP returning to the hospital a day after discharge due to blood culture showing MSSA bacteremia.  He was hemodynamically stable.  Labs without leukocytosis or significant finding.  Blood cultures drawn.  He was initially started on IV ceftriaxone and doxycycline, and continued on IV Ancef and azithromycin.    Repeat blood culture NGTD.   TTE and TEE without vegetation or significant finding.  PICC line placed on 10/4.  Cleared for discharge on IV Ancef until 06/24/2023 (total of 4 weeks from negative blood culture).  Weekly labs as above.  ID to  arrange outpatient follow-up  See individual problem list below for more.   Problems addressed during this hospitalization Principal Problem:   MSSA bacteremia Active Problems:   Cellulitis of lower extremity   MSSA bacteremia: POA.  Pulmonary source?  Has no ulcers.  Patient appears well.  Hemodynamically stable.  No fever or leukocytosis.  Lactic acid within normal.  Repeat blood culture on 10/2 NGTD.  TTE and TEE without significant finding of vegetation.  PICC line placed on 10/4.  Discharged on IV Ancef until 06/24/2023.   Community-acquired pneumonia: POA.  No significant respiratory distress. -IV Ancef as above.  Completed course for atypical coverage.   Paroxysmal A-fib on Eliquis: Rate controlled. -Continue home Eliquis.  Renal function is borderline.  Anticipate improvement -Toprol-XL to 25 mg daily.  Was on 12.5 mg daily while hospitalized.  Was on 50 mg before admission.   Hyperglycemia: No history of diabetes.  A1c 6.1%.  Not on meds.   Essential hypertension: BP slightly elevated this morning.  On amlodipine, Toprol XL and Lasix at home.  -Continue home amlodipine -Toprol-XL as above -Continue home Lasix as needed  Hyperlipidemia -Continue home statin   BPH with urinary hesitancy -Continue Proscar and Flomax   Constipation: Resolved   GERD -Continue PPI   Physical deconditioning-evaluated by therapy.  No need identified.   Chronic back pain: No acute change.  Symmetric.  Low suspicion for infection. -Continue home Tylenol and gabapentin           Time spent 35 minutes  Vital signs  noted a moderate amount of probable edema within the soft tissues. IMPRESSION: Moderate amount of edema is noted within the soft tissues. No definite abscess or mass is noted. Electronically Signed   By: Lupita Raider M.D.   On: 05/29/2023 14:50   [place] EKG SITE RITE  Result Date: 05/29/2023 If Site Rite image not attached, placement could not be  confirmed due to current cardiac rhythm.  ECHOCARDIOGRAM COMPLETE  Result Date: 05/28/2023    ECHOCARDIOGRAM REPORT   Patient Name:   [name] Date of Exam: 05/28/2023 Medical Rec #:  [id number]        Height:       74.0 in Accession #:    [id number]       Weight:       184.0 lb Date of Birth:  11/12/1931        BSA:          2.098 m Patient Age:    89 years         BP:           120/60 mmHg Patient Gender: M                HR:           70 bpm. Exam Location:  Inpatient Procedure: 2D Echo, Cardiac Doppler and Color Doppler Indications:    Bacteremia R78.81  History:        Patient has prior history of Echocardiogram examinations, most                 recent 04/08/2019. Arrythmias:Atrial Fibrillation; Risk                 Factors:Hypertension, Dyslipidemia and Former Smoker.  Sonographer:    Dondra Prader RVT RCS Referring Phys: Dow Adolph, N IMPRESSIONS  1. Left ventricular ejection fraction, by estimation, is 55 to 60%. The left ventricle has normal function. The left ventricle has no regional wall motion abnormalities. Left ventricular diastolic parameters were normal.  2. Right ventricular systolic function is normal. The right ventricular size is normal.  3. The mitral valve is normal in structure. Trivial mitral valve regurgitation. No evidence of mitral stenosis.  4. The aortic valve is tricuspid. There is mild calcification of the aortic valve. Aortic valve regurgitation is not visualized. No aortic stenosis is present.  5. The inferior vena cava is normal in size with greater than 50% respiratory variability, suggesting right atrial pressure of 3 mmHg. FINDINGS  Left Ventricle: Left ventricular ejection fraction, by estimation, is 55 to 60%. The left ventricle has normal function. The left ventricle has no regional wall motion abnormalities. The left ventricular internal cavity size was normal in size. There is  no left ventricular hypertrophy. Left ventricular diastolic parameters were normal.  Right Ventricle: The right ventricular size is normal. No increase in right ventricular wall thickness. Right ventricular systolic function is normal. Left Atrium: Left atrial size was normal in size. Right Atrium: Right atrial size was normal in size. Pericardium: There is no evidence of pericardial effusion. Mitral Valve: The mitral valve is normal in structure. Trivial mitral valve regurgitation. No evidence of mitral valve stenosis. Tricuspid Valve: The tricuspid valve is normal in structure. Tricuspid valve regurgitation is trivial. No evidence of tricuspid stenosis. Aortic Valve: The aortic valve is tricuspid. There is mild calcification of the aortic valve. Aortic valve regurgitation is not visualized. No aortic stenosis is present. Aortic valve mean gradient measures 2.0 mmHg. Aortic valve  Physician Discharge Summary  [name] GLO:[id number] DOB: 17-Dec-1931 DOA: 05/27/2023  PCP: Kristian Covey, MD  Admit date: 05/27/2023 Discharge date: 06/02/2023 Admitted From: Home Disposition: Home Recommendations for Outpatient Follow-up:  Outpatient follow-up with PCP in 1 week ID to arrange outpatient follow-up Check CBC with differential, BMP, CRP and ESR weekly Please follow up on the following pending results: None  Home Health: HH RN Equipment/Devices: Home infusion for IV antibiotics  Discharge Condition: Stable CODE STATUS: Full code  Follow-up Information     Kristian Covey, MD. Schedule an appointment as soon as possible for a visit in 1 week(s).   Specialty: Family Medicine Contact information: [address] [place] [address] [phone]         Jobe Gibbon, [facility] Follow up.   Specialty: Home Health Services Why: Someone will call you to schedule first RN visit. Contact information: [address] [place] [address] [phone]         [facility] Follow up.   Why: Someone will call you to scheule first infusion visit. Contact information: [address] [place], [place] [address]  [phone]                Hospital course 89 year old M with PMH of paroxysmal A-fib on Eliquis, HTN, CKD-3B and recent hospitalization for CAP returning to the hospital a day after discharge due to blood culture showing MSSA bacteremia.  He was hemodynamically stable.  Labs without leukocytosis or significant finding.  Blood cultures drawn.  He was initially started on IV ceftriaxone and doxycycline, and continued on IV Ancef and azithromycin.    Repeat blood culture NGTD.   TTE and TEE without vegetation or significant finding.  PICC line placed on 10/4.  Cleared for discharge on IV Ancef until 06/24/2023 (total of 4 weeks from negative blood culture).  Weekly labs as above.  ID to  arrange outpatient follow-up  See individual problem list below for more.   Problems addressed during this hospitalization Principal Problem:   MSSA bacteremia Active Problems:   Cellulitis of lower extremity   MSSA bacteremia: POA.  Pulmonary source?  Has no ulcers.  Patient appears well.  Hemodynamically stable.  No fever or leukocytosis.  Lactic acid within normal.  Repeat blood culture on 10/2 NGTD.  TTE and TEE without significant finding of vegetation.  PICC line placed on 10/4.  Discharged on IV Ancef until 06/24/2023.   Community-acquired pneumonia: POA.  No significant respiratory distress. -IV Ancef as above.  Completed course for atypical coverage.   Paroxysmal A-fib on Eliquis: Rate controlled. -Continue home Eliquis.  Renal function is borderline.  Anticipate improvement -Toprol-XL to 25 mg daily.  Was on 12.5 mg daily while hospitalized.  Was on 50 mg before admission.   Hyperglycemia: No history of diabetes.  A1c 6.1%.  Not on meds.   Essential hypertension: BP slightly elevated this morning.  On amlodipine, Toprol XL and Lasix at home.  -Continue home amlodipine -Toprol-XL as above -Continue home Lasix as needed  Hyperlipidemia -Continue home statin   BPH with urinary hesitancy -Continue Proscar and Flomax   Constipation: Resolved   GERD -Continue PPI   Physical deconditioning-evaluated by therapy.  No need identified.   Chronic back pain: No acute change.  Symmetric.  Low suspicion for infection. -Continue home Tylenol and gabapentin           Time spent 35 minutes  Vital signs  of the patient and/or caregiver's ability to self-administer the medication ordered   Complete by: As directed       Allergies as of 06/02/2023       Reactions   Amoxicillin-pot Clavulanate Diarrhea, Nausea Only   Losartan Other (See Comments), Nausea And Vomiting   Elevated creatinine levels Elevated creatinine levels        Medication List     TAKE these medications    acetaminophen 500 MG tablet Commonly known as: TYLENOL Take 500 mg by mouth every 6 (six) hours as needed for mild pain.   amLODipine 2.5 MG tablet Commonly known as: NORVASC TAKE 1 TABLET EVERY DAY   budesonide-formoterol 80-4.5 MCG/ACT inhaler Commonly known as: Breyna Inhale 2 puffs into the lungs in the morning and at bedtime.   ceFAZolin IVPB Commonly known as: ANCEF Inject 2 g into the vein every 8 (eight) hours for 23 days. Indication:  MSSA bacteremia First Dose: Yes Last Day of Therapy:  06/24/23 Labs - Once weekly:  CBC/D and BMP, Labs -  Once weekly: ESR and CRP Method of administration: IV Push Pull PICC line at the completion of IV antibiotic therapy Method of administration may be changed at the discretion of home infusion pharmacist based upon assessment of the patient and/or caregiver's ability to self-administer the medication ordered.   Eliquis 5 MG Tabs tablet Generic drug: apixaban TAKE 1 TABLET BY MOUTH TWICE A DAY   finasteride 5 MG tablet Commonly known as: PROSCAR TAKE 1 TABLET (5 MG TOTAL) BY MOUTH DAILY.   furosemide 40 MG tablet Commonly known as: LASIX TAKE 1 TABLET EVERY DAY AS NEEDED FOR LEG SWELLING   glucosamine-chondroitin 500-400 MG tablet Take 2 tablets by mouth daily.   lansoprazole 30 MG capsule Commonly known as: PREVACID TAKE 1 CAPSULE EVERY DAY AT 12 NOON.   lovastatin 20 MG tablet Commonly known as: MEVACOR TAKE 1 TABLET BY MOUTH EVERYDAY AT BEDTIME   metoprolol succinate 25 MG 24 hr tablet Commonly known as: TOPROL-XL Take 1 tablet (25 mg total) by mouth daily. TAKE WITH OR IMMEDIATELY FOLLOWING A MEAL. What changed:  medication strength how much to take   potassium chloride 10 MEQ tablet Commonly known as: KLOR-CON TAKE 2 TABLETS BY MOUTH EVERY DAY   tamsulosin 0.4 MG Caps capsule Commonly known as: FLOMAX Take 1 capsule (0.4 mg total) by mouth daily.   vitamin B-12 500 MCG tablet Commonly known as: CYANOCOBALAMIN Take 500 mcg by mouth daily.   VITAMIN D PO Take 400 Units by mouth daily.               Discharge Care Instructions  (From admission, onward)           Start     Ordered   06/01/23 0000  Change dressing on IV access line weekly and PRN  (Home infusion instructions - [facility] )        06/01/23 1250            Consultations: Infectious disease Cardiology  Procedures/Studies: 10/7-TEE   ECHO TEE  Result Date: 06/01/2023    TRANSESOPHOGEAL ECHO REPORT   Patient Name:   [name] Date of Exam: 06/01/2023  Medical Rec #:  [id number]        Height:       74.0 in Accession #:    [id number]       Weight:       184.0 lb Date of Birth:  April 09, 1932  noted a moderate amount of probable edema within the soft tissues. IMPRESSION: Moderate amount of edema is noted within the soft tissues. No definite abscess or mass is noted. Electronically Signed   By: Lupita Raider M.D.   On: 05/29/2023 14:50   [place] EKG SITE RITE  Result Date: 05/29/2023 If Site Rite image not attached, placement could not be  confirmed due to current cardiac rhythm.  ECHOCARDIOGRAM COMPLETE  Result Date: 05/28/2023    ECHOCARDIOGRAM REPORT   Patient Name:   [name] Date of Exam: 05/28/2023 Medical Rec #:  [id number]        Height:       74.0 in Accession #:    [id number]       Weight:       184.0 lb Date of Birth:  11/12/1931        BSA:          2.098 m Patient Age:    89 years         BP:           120/60 mmHg Patient Gender: M                HR:           70 bpm. Exam Location:  Inpatient Procedure: 2D Echo, Cardiac Doppler and Color Doppler Indications:    Bacteremia R78.81  History:        Patient has prior history of Echocardiogram examinations, most                 recent 04/08/2019. Arrythmias:Atrial Fibrillation; Risk                 Factors:Hypertension, Dyslipidemia and Former Smoker.  Sonographer:    Dondra Prader RVT RCS Referring Phys: Dow Adolph, N IMPRESSIONS  1. Left ventricular ejection fraction, by estimation, is 55 to 60%. The left ventricle has normal function. The left ventricle has no regional wall motion abnormalities. Left ventricular diastolic parameters were normal.  2. Right ventricular systolic function is normal. The right ventricular size is normal.  3. The mitral valve is normal in structure. Trivial mitral valve regurgitation. No evidence of mitral stenosis.  4. The aortic valve is tricuspid. There is mild calcification of the aortic valve. Aortic valve regurgitation is not visualized. No aortic stenosis is present.  5. The inferior vena cava is normal in size with greater than 50% respiratory variability, suggesting right atrial pressure of 3 mmHg. FINDINGS  Left Ventricle: Left ventricular ejection fraction, by estimation, is 55 to 60%. The left ventricle has normal function. The left ventricle has no regional wall motion abnormalities. The left ventricular internal cavity size was normal in size. There is  no left ventricular hypertrophy. Left ventricular diastolic parameters were normal.  Right Ventricle: The right ventricular size is normal. No increase in right ventricular wall thickness. Right ventricular systolic function is normal. Left Atrium: Left atrial size was normal in size. Right Atrium: Right atrial size was normal in size. Pericardium: There is no evidence of pericardial effusion. Mitral Valve: The mitral valve is normal in structure. Trivial mitral valve regurgitation. No evidence of mitral valve stenosis. Tricuspid Valve: The tricuspid valve is normal in structure. Tricuspid valve regurgitation is trivial. No evidence of tricuspid stenosis. Aortic Valve: The aortic valve is tricuspid. There is mild calcification of the aortic valve. Aortic valve regurgitation is not visualized. No aortic stenosis is present. Aortic valve mean gradient measures 2.0 mmHg. Aortic valve  Physician Discharge Summary  [name] GLO:[id number] DOB: 17-Dec-1931 DOA: 05/27/2023  PCP: Kristian Covey, MD  Admit date: 05/27/2023 Discharge date: 06/02/2023 Admitted From: Home Disposition: Home Recommendations for Outpatient Follow-up:  Outpatient follow-up with PCP in 1 week ID to arrange outpatient follow-up Check CBC with differential, BMP, CRP and ESR weekly Please follow up on the following pending results: None  Home Health: HH RN Equipment/Devices: Home infusion for IV antibiotics  Discharge Condition: Stable CODE STATUS: Full code  Follow-up Information     Kristian Covey, MD. Schedule an appointment as soon as possible for a visit in 1 week(s).   Specialty: Family Medicine Contact information: [address] [place] [address] [phone]         Jobe Gibbon, [facility] Follow up.   Specialty: Home Health Services Why: Someone will call you to schedule first RN visit. Contact information: [address] [place] [address] [phone]         [facility] Follow up.   Why: Someone will call you to scheule first infusion visit. Contact information: [address] [place], [place] [address]  [phone]                Hospital course 89 year old M with PMH of paroxysmal A-fib on Eliquis, HTN, CKD-3B and recent hospitalization for CAP returning to the hospital a day after discharge due to blood culture showing MSSA bacteremia.  He was hemodynamically stable.  Labs without leukocytosis or significant finding.  Blood cultures drawn.  He was initially started on IV ceftriaxone and doxycycline, and continued on IV Ancef and azithromycin.    Repeat blood culture NGTD.   TTE and TEE without vegetation or significant finding.  PICC line placed on 10/4.  Cleared for discharge on IV Ancef until 06/24/2023 (total of 4 weeks from negative blood culture).  Weekly labs as above.  ID to  arrange outpatient follow-up  See individual problem list below for more.   Problems addressed during this hospitalization Principal Problem:   MSSA bacteremia Active Problems:   Cellulitis of lower extremity   MSSA bacteremia: POA.  Pulmonary source?  Has no ulcers.  Patient appears well.  Hemodynamically stable.  No fever or leukocytosis.  Lactic acid within normal.  Repeat blood culture on 10/2 NGTD.  TTE and TEE without significant finding of vegetation.  PICC line placed on 10/4.  Discharged on IV Ancef until 06/24/2023.   Community-acquired pneumonia: POA.  No significant respiratory distress. -IV Ancef as above.  Completed course for atypical coverage.   Paroxysmal A-fib on Eliquis: Rate controlled. -Continue home Eliquis.  Renal function is borderline.  Anticipate improvement -Toprol-XL to 25 mg daily.  Was on 12.5 mg daily while hospitalized.  Was on 50 mg before admission.   Hyperglycemia: No history of diabetes.  A1c 6.1%.  Not on meds.   Essential hypertension: BP slightly elevated this morning.  On amlodipine, Toprol XL and Lasix at home.  -Continue home amlodipine -Toprol-XL as above -Continue home Lasix as needed  Hyperlipidemia -Continue home statin   BPH with urinary hesitancy -Continue Proscar and Flomax   Constipation: Resolved   GERD -Continue PPI   Physical deconditioning-evaluated by therapy.  No need identified.   Chronic back pain: No acute change.  Symmetric.  Low suspicion for infection. -Continue home Tylenol and gabapentin           Time spent 35 minutes  Vital signs  of the patient and/or caregiver's ability to self-administer the medication ordered   Complete by: As directed       Allergies as of 06/02/2023       Reactions   Amoxicillin-pot Clavulanate Diarrhea, Nausea Only   Losartan Other (See Comments), Nausea And Vomiting   Elevated creatinine levels Elevated creatinine levels        Medication List     TAKE these medications    acetaminophen 500 MG tablet Commonly known as: TYLENOL Take 500 mg by mouth every 6 (six) hours as needed for mild pain.   amLODipine 2.5 MG tablet Commonly known as: NORVASC TAKE 1 TABLET EVERY DAY   budesonide-formoterol 80-4.5 MCG/ACT inhaler Commonly known as: Breyna Inhale 2 puffs into the lungs in the morning and at bedtime.   ceFAZolin IVPB Commonly known as: ANCEF Inject 2 g into the vein every 8 (eight) hours for 23 days. Indication:  MSSA bacteremia First Dose: Yes Last Day of Therapy:  06/24/23 Labs - Once weekly:  CBC/D and BMP, Labs -  Once weekly: ESR and CRP Method of administration: IV Push Pull PICC line at the completion of IV antibiotic therapy Method of administration may be changed at the discretion of home infusion pharmacist based upon assessment of the patient and/or caregiver's ability to self-administer the medication ordered.   Eliquis 5 MG Tabs tablet Generic drug: apixaban TAKE 1 TABLET BY MOUTH TWICE A DAY   finasteride 5 MG tablet Commonly known as: PROSCAR TAKE 1 TABLET (5 MG TOTAL) BY MOUTH DAILY.   furosemide 40 MG tablet Commonly known as: LASIX TAKE 1 TABLET EVERY DAY AS NEEDED FOR LEG SWELLING   glucosamine-chondroitin 500-400 MG tablet Take 2 tablets by mouth daily.   lansoprazole 30 MG capsule Commonly known as: PREVACID TAKE 1 CAPSULE EVERY DAY AT 12 NOON.   lovastatin 20 MG tablet Commonly known as: MEVACOR TAKE 1 TABLET BY MOUTH EVERYDAY AT BEDTIME   metoprolol succinate 25 MG 24 hr tablet Commonly known as: TOPROL-XL Take 1 tablet (25 mg total) by mouth daily. TAKE WITH OR IMMEDIATELY FOLLOWING A MEAL. What changed:  medication strength how much to take   potassium chloride 10 MEQ tablet Commonly known as: KLOR-CON TAKE 2 TABLETS BY MOUTH EVERY DAY   tamsulosin 0.4 MG Caps capsule Commonly known as: FLOMAX Take 1 capsule (0.4 mg total) by mouth daily.   vitamin B-12 500 MCG tablet Commonly known as: CYANOCOBALAMIN Take 500 mcg by mouth daily.   VITAMIN D PO Take 400 Units by mouth daily.               Discharge Care Instructions  (From admission, onward)           Start     Ordered   06/01/23 0000  Change dressing on IV access line weekly and PRN  (Home infusion instructions - [facility] )        06/01/23 1250            Consultations: Infectious disease Cardiology  Procedures/Studies: 10/7-TEE   ECHO TEE  Result Date: 06/01/2023    TRANSESOPHOGEAL ECHO REPORT   Patient Name:   [name] Date of Exam: 06/01/2023  Medical Rec #:  [id number]        Height:       74.0 in Accession #:    [id number]       Weight:       184.0 lb Date of Birth:  April 09, 1932  noted a moderate amount of probable edema within the soft tissues. IMPRESSION: Moderate amount of edema is noted within the soft tissues. No definite abscess or mass is noted. Electronically Signed   By: Lupita Raider M.D.   On: 05/29/2023 14:50   [place] EKG SITE RITE  Result Date: 05/29/2023 If Site Rite image not attached, placement could not be  confirmed due to current cardiac rhythm.  ECHOCARDIOGRAM COMPLETE  Result Date: 05/28/2023    ECHOCARDIOGRAM REPORT   Patient Name:   [name] Date of Exam: 05/28/2023 Medical Rec #:  [id number]        Height:       74.0 in Accession #:    [id number]       Weight:       184.0 lb Date of Birth:  11/12/1931        BSA:          2.098 m Patient Age:    89 years         BP:           120/60 mmHg Patient Gender: M                HR:           70 bpm. Exam Location:  Inpatient Procedure: 2D Echo, Cardiac Doppler and Color Doppler Indications:    Bacteremia R78.81  History:        Patient has prior history of Echocardiogram examinations, most                 recent 04/08/2019. Arrythmias:Atrial Fibrillation; Risk                 Factors:Hypertension, Dyslipidemia and Former Smoker.  Sonographer:    Dondra Prader RVT RCS Referring Phys: Dow Adolph, N IMPRESSIONS  1. Left ventricular ejection fraction, by estimation, is 55 to 60%. The left ventricle has normal function. The left ventricle has no regional wall motion abnormalities. Left ventricular diastolic parameters were normal.  2. Right ventricular systolic function is normal. The right ventricular size is normal.  3. The mitral valve is normal in structure. Trivial mitral valve regurgitation. No evidence of mitral stenosis.  4. The aortic valve is tricuspid. There is mild calcification of the aortic valve. Aortic valve regurgitation is not visualized. No aortic stenosis is present.  5. The inferior vena cava is normal in size with greater than 50% respiratory variability, suggesting right atrial pressure of 3 mmHg. FINDINGS  Left Ventricle: Left ventricular ejection fraction, by estimation, is 55 to 60%. The left ventricle has normal function. The left ventricle has no regional wall motion abnormalities. The left ventricular internal cavity size was normal in size. There is  no left ventricular hypertrophy. Left ventricular diastolic parameters were normal.  Right Ventricle: The right ventricular size is normal. No increase in right ventricular wall thickness. Right ventricular systolic function is normal. Left Atrium: Left atrial size was normal in size. Right Atrium: Right atrial size was normal in size. Pericardium: There is no evidence of pericardial effusion. Mitral Valve: The mitral valve is normal in structure. Trivial mitral valve regurgitation. No evidence of mitral valve stenosis. Tricuspid Valve: The tricuspid valve is normal in structure. Tricuspid valve regurgitation is trivial. No evidence of tricuspid stenosis. Aortic Valve: The aortic valve is tricuspid. There is mild calcification of the aortic valve. Aortic valve regurgitation is not visualized. No aortic stenosis is present. Aortic valve mean gradient measures 2.0 mmHg. Aortic valve  of the patient and/or caregiver's ability to self-administer the medication ordered   Complete by: As directed       Allergies as of 06/02/2023       Reactions   Amoxicillin-pot Clavulanate Diarrhea, Nausea Only   Losartan Other (See Comments), Nausea And Vomiting   Elevated creatinine levels Elevated creatinine levels        Medication List     TAKE these medications    acetaminophen 500 MG tablet Commonly known as: TYLENOL Take 500 mg by mouth every 6 (six) hours as needed for mild pain.   amLODipine 2.5 MG tablet Commonly known as: NORVASC TAKE 1 TABLET EVERY DAY   budesonide-formoterol 80-4.5 MCG/ACT inhaler Commonly known as: Breyna Inhale 2 puffs into the lungs in the morning and at bedtime.   ceFAZolin IVPB Commonly known as: ANCEF Inject 2 g into the vein every 8 (eight) hours for 23 days. Indication:  MSSA bacteremia First Dose: Yes Last Day of Therapy:  06/24/23 Labs - Once weekly:  CBC/D and BMP, Labs -  Once weekly: ESR and CRP Method of administration: IV Push Pull PICC line at the completion of IV antibiotic therapy Method of administration may be changed at the discretion of home infusion pharmacist based upon assessment of the patient and/or caregiver's ability to self-administer the medication ordered.   Eliquis 5 MG Tabs tablet Generic drug: apixaban TAKE 1 TABLET BY MOUTH TWICE A DAY   finasteride 5 MG tablet Commonly known as: PROSCAR TAKE 1 TABLET (5 MG TOTAL) BY MOUTH DAILY.   furosemide 40 MG tablet Commonly known as: LASIX TAKE 1 TABLET EVERY DAY AS NEEDED FOR LEG SWELLING   glucosamine-chondroitin 500-400 MG tablet Take 2 tablets by mouth daily.   lansoprazole 30 MG capsule Commonly known as: PREVACID TAKE 1 CAPSULE EVERY DAY AT 12 NOON.   lovastatin 20 MG tablet Commonly known as: MEVACOR TAKE 1 TABLET BY MOUTH EVERYDAY AT BEDTIME   metoprolol succinate 25 MG 24 hr tablet Commonly known as: TOPROL-XL Take 1 tablet (25 mg total) by mouth daily. TAKE WITH OR IMMEDIATELY FOLLOWING A MEAL. What changed:  medication strength how much to take   potassium chloride 10 MEQ tablet Commonly known as: KLOR-CON TAKE 2 TABLETS BY MOUTH EVERY DAY   tamsulosin 0.4 MG Caps capsule Commonly known as: FLOMAX Take 1 capsule (0.4 mg total) by mouth daily.   vitamin B-12 500 MCG tablet Commonly known as: CYANOCOBALAMIN Take 500 mcg by mouth daily.   VITAMIN D PO Take 400 Units by mouth daily.               Discharge Care Instructions  (From admission, onward)           Start     Ordered   06/01/23 0000  Change dressing on IV access line weekly and PRN  (Home infusion instructions - [facility] )        06/01/23 1250            Consultations: Infectious disease Cardiology  Procedures/Studies: 10/7-TEE   ECHO TEE  Result Date: 06/01/2023    TRANSESOPHOGEAL ECHO REPORT   Patient Name:   [name] Date of Exam: 06/01/2023  Medical Rec #:  [id number]        Height:       74.0 in Accession #:    [id number]       Weight:       184.0 lb Date of Birth:  April 09, 1932

## 2023-06-06 ENCOUNTER — Other Ambulatory Visit: Payer: Self-pay | Admitting: Cardiology

## 2023-06-08 DIAGNOSIS — G4730 Sleep apnea, unspecified: Secondary | ICD-10-CM | POA: Diagnosis not present

## 2023-06-08 DIAGNOSIS — N1832 Chronic kidney disease, stage 3b: Secondary | ICD-10-CM | POA: Diagnosis not present

## 2023-06-08 DIAGNOSIS — M1990 Unspecified osteoarthritis, unspecified site: Secondary | ICD-10-CM | POA: Diagnosis not present

## 2023-06-08 DIAGNOSIS — I129 Hypertensive chronic kidney disease with stage 1 through stage 4 chronic kidney disease, or unspecified chronic kidney disease: Secondary | ICD-10-CM | POA: Diagnosis not present

## 2023-06-08 DIAGNOSIS — M5431 Sciatica, right side: Secondary | ICD-10-CM | POA: Diagnosis not present

## 2023-06-08 DIAGNOSIS — I4819 Other persistent atrial fibrillation: Secondary | ICD-10-CM | POA: Diagnosis not present

## 2023-06-08 DIAGNOSIS — A4101 Sepsis due to Methicillin susceptible Staphylococcus aureus: Secondary | ICD-10-CM | POA: Diagnosis not present

## 2023-06-08 DIAGNOSIS — I890 Lymphedema, not elsewhere classified: Secondary | ICD-10-CM | POA: Diagnosis not present

## 2023-06-08 DIAGNOSIS — G9340 Encephalopathy, unspecified: Secondary | ICD-10-CM | POA: Diagnosis not present

## 2023-06-08 DIAGNOSIS — J189 Pneumonia, unspecified organism: Secondary | ICD-10-CM | POA: Diagnosis not present

## 2023-06-08 DIAGNOSIS — E785 Hyperlipidemia, unspecified: Secondary | ICD-10-CM | POA: Diagnosis not present

## 2023-06-08 DIAGNOSIS — M48062 Spinal stenosis, lumbar region with neurogenic claudication: Secondary | ICD-10-CM | POA: Diagnosis not present

## 2023-06-10 ENCOUNTER — Other Ambulatory Visit: Payer: Self-pay | Admitting: Family Medicine

## 2023-06-10 ENCOUNTER — Ambulatory Visit (INDEPENDENT_AMBULATORY_CARE_PROVIDER_SITE_OTHER): Payer: Medicare (Managed Care) | Admitting: Family Medicine

## 2023-06-10 ENCOUNTER — Encounter: Payer: Self-pay | Admitting: Family Medicine

## 2023-06-10 VITALS — BP 122/58 | HR 80 | Temp 98.3°F | Ht 74.0 in | Wt 182.8 lb

## 2023-06-10 DIAGNOSIS — J189 Pneumonia, unspecified organism: Secondary | ICD-10-CM | POA: Diagnosis not present

## 2023-06-10 DIAGNOSIS — M5432 Sciatica, left side: Secondary | ICD-10-CM | POA: Diagnosis not present

## 2023-06-10 DIAGNOSIS — R7881 Bacteremia: Secondary | ICD-10-CM

## 2023-06-10 DIAGNOSIS — B9561 Methicillin susceptible Staphylococcus aureus infection as the cause of diseases classified elsewhere: Secondary | ICD-10-CM

## 2023-06-10 MED ORDER — TRAMADOL HCL 50 MG PO TABS
50.0000 mg | ORAL_TABLET | Freq: Four times a day (QID) | ORAL | 0 refills | Status: AC | PRN
Start: 2023-06-10 — End: 2023-06-15

## 2023-06-10 NOTE — Patient Instructions (Addendum)
Increase the Gabapentin to 200 mg (2 capsules) by mouth every 8 hours as needed for pain.  If not controlled with that may increase further to 3 capsules three times daily.    Follow up for any fever, increased shortness of breath, or other concerns.

## 2023-06-10 NOTE — Progress Notes (Unsigned)
Established Patient Office Visit  Subjective   Patient ID: [name], male    DOB: 1932/07/27  Age: 89 y.o. MRN: [id number]  Chief Complaint  Patient presents with   Hospitalization Follow-up   Leg Pain    HPI  {History (Optional):23778} [name] is here today companied by caregiver following recent hospitalization.  He presented to the ED on October 1 with multiple complaints.  He has some confusion at baseline and was more confused than his normal state.  Had been somewhat agitated on day of admission.  Possible low-grade fever 100.4 with some shortness of breath but no cough.  Brought in by EMS.  Did have some initial leukocytosis with white count of 17,000.  Chest x-ray showed questionable infiltrate versus atelectasis.  He had some tachypnea and positive lactic acid.  Started on ceftriaxone and azithromycin with blood cultures obtained.  He apparently was discharged and then readmitted with MSSA bacteremia from positive culture and started on IV Ancef as well as 1 additional dose of Rocephin and 1 dose of doxycycline in the ED.  Remain hemodynamically stable.  Repeat blood cultures were drawn which remain negative.  Transthoracic and transesophageal echo without vegetation or other significant abnormality.  PICC line placed on 4 October.  Recommendation for IV Ancef through end of October-or 4 weeks from negative blood culture.  Pending follow-up with ID.  Source of MSSA bacteremia not totally clear.  Question of pulmonary.  His main complaint at this time is left-sided sciatica type symptoms for past week.  Is had extensive history of back difficulties.  Apparently saw a back specialist several weeks ago prior to his admission and had what sounds like epidural injection.  He was also started back on gabapentin though this was not on his med list.  He was started back at gabapentin 100 mg 3 times daily.  He is having fairly severe pain frequently waking from sleep.  Previously is  taking gabapentin along with occasional tramadol with some relief.  Past Medical History:  Diagnosis Date   Actinic keratosis 03/13/2009   Arthritis    BENIGN PROSTATIC HYPERTROPHY, HX OF 04/07/2009   CARCINOMA, SKIN, SQUAMOUS CELL 09/11/2009   COLONIC POLYPS, HX OF 03/13/2009   Essential hypertension    GERD 03/13/2009   Hyperlipidemia    INSOMNIA, CHRONIC 09/11/2009   Persistent atrial fibrillation (HCC) 04/07/2009   a. s/p afib ablation at [facility] x 2 in 2010.   POLYPECTOMY, HX OF 04/07/2009   Premature atrial contractions    PULMONARY NODULE 09/11/2009   PVC's (premature ventricular contractions)    Rosacea 03/13/2009   SKIN CANCER, HX OF 03/13/2009   Past Surgical History:  Procedure Laterality Date   ABLATION  2010   x2   COLONOSCOPY W/ BIOPSIES AND POLYPECTOMY     EYE SURGERY Bilateral    cataracts   LUMBAR DISC SURGERY     RUPTURE   LUMBAR LAMINECTOMY/DECOMPRESSION MICRODISCECTOMY N/A 04/20/2014   Procedure: LUMBAR TWO-THREE, LUMBAR THREE-FOUR, LUMAR FOUR-FIVE LAMINECTOMIES;  Surgeon: Tressie Stalker, MD;  Location: [facility];  Service: Neurosurgery;  Laterality: N/A;   POLYPECTOMY     SKIN CANCER EXCISION     TEE WITHOUT CARDIOVERSION N/A 06/01/2023   Procedure: TRANSESOPHAGEAL ECHOCARDIOGRAM;  Surgeon: Pricilla Riffle, MD;  Location: [facility] INVASIVE CV LAB;  Service: Cardiovascular;  Laterality: N/A;    reports that he quit smoking about 35 years ago. His smoking use included cigarettes. He started smoking about 65 years ago. He has  a 30 pack-year smoking history. His smokeless tobacco use includes chew. He reports that he does not drink alcohol and does not use drugs. family history includes CAD in his brother, brother, and sister; Cancer in his mother; Cancer (age of onset: 43) in his father; Heart attack in his father; Heart disease in his brother, brother, father, and sister. Allergies  Allergen Reactions   Amoxicillin-Pot Clavulanate Diarrhea and Nausea Only   Losartan Other  (See Comments) and Nausea And Vomiting    Elevated creatinine levels Elevated creatinine levels    Review of Systems  Constitutional:  Negative for chills and fever.  Respiratory:  Negative for cough.   Cardiovascular:  Negative for chest pain.  Gastrointestinal:  Negative for abdominal pain, nausea and vomiting.  Genitourinary:  Negative for dysuria.  Musculoskeletal:  Positive for back pain.  Neurological:  Negative for dizziness and focal weakness.      Objective:     BP (!) 122/58 (BP Location: Left Arm, Cuff Size: Normal)   Pulse 80   Temp 98.3 F (36.8 C) (Oral)   Ht 6\' 2"  (1.88 m)   Wt 182 lb 12.8 oz (82.9 kg)   SpO2 98%   BMI 23.47 kg/m  BP Readings from Last 3 Encounters:  06/10/23 (!) 122/58  06/02/23 137/67  05/27/23 (!) 136/58   Wt Readings from Last 3 Encounters:  06/10/23 182 lb 12.8 oz (82.9 kg)  05/27/23 184 lb (83.5 kg)  05/26/23 182 lb (82.6 kg)      Physical Exam Vitals reviewed.  Constitutional:      General: He is not in acute distress.    Appearance: Normal appearance. He is not toxic-appearing.  Cardiovascular:     Rate and Rhythm: Normal rate.  Pulmonary:     Effort: Pulmonary effort is normal.     Breath sounds: Normal breath sounds. No wheezing or rales.  Musculoskeletal:     Comments: Minimal pitting edema at the ankles but relative sparing of any edema in the legs  Neurological:     Mental Status: He is alert.      No results found for any visits on 06/10/23.  Last CBC Lab Results  Component Value Date   WBC 5.6 06/01/2023   HGB 9.9 (L) 06/01/2023   HCT 31.5 (L) 06/01/2023   MCV 99.7 06/01/2023   MCH 31.3 06/01/2023   RDW 16.6 (H) 06/01/2023   PLT 181 06/01/2023   Last metabolic panel Lab Results  Component Value Date   GLUCOSE 115 (H) 06/01/2023   NA 140 06/01/2023   K 3.5 06/01/2023   CL 103 06/01/2023   CO2 28 06/01/2023   BUN 24 (H) 06/01/2023   CREATININE 1.52 (H) 06/01/2023   GFRNONAA 44 (L) 06/01/2023    CALCIUM 8.4 (L) 06/01/2023   PHOS 3.8 06/01/2023   PROT 6.1 (L) 05/26/2023   ALBUMIN 2.8 (L) 06/01/2023   LABGLOB 1.9 11/15/2020   AGRATIO 2.2 11/15/2020   BILITOT 1.1 05/26/2023   ALKPHOS 53 05/26/2023   AST 19 05/26/2023   ALT 13 05/26/2023   ANIONGAP 9 06/01/2023      The ASCVD Risk score (Arnett DK, et al., 2019) failed to calculate for the following reasons:   The 2019 ASCVD risk score is only valid for ages 64 to 41    Assessment & Plan:   #1 MSSA bacteremia.  Etiology not clear.  No open skin ulcers.  Patient on 30-day course of Ancef.  No fever since discharge.  Follow-up  pending with ID.  Repeat blood culture 10/2 no growth to date.  Echocardiograms revealed no evidence for valvular vegetation.  PICC line was placed on 10/4. -Follow-up immediately for any fever -He is supposed to be getting weekly BMP, CBC, C-reactive protein, sed rate and reportedly had labs drawn 2 days ago per home health nursing.  We have not seen results yet.  #2 question of community-acquired pneumonia.  Patient was covered with broad-spectrum antibiotics.  Denies any cough at this time.  Lung exam unremarkable.  Patient completed antibiotics recently was treated with Rocephin and Zithromax   #3 sciatica type symptoms left lower extremity.  He has longstanding history of lumbosacral degenerative disc disease.  Is had multiple injections previously.  Gabapentin was not on his med list but he takes 100 mg currently 3 times daily.  We suggested titrating this up to 200 mg 3 times daily and can supplement with Tylenol as needed.  We wrote for limited tramadol 1 every 8 hours as needed for severe pain  Evelena Peat, MD

## 2023-06-15 ENCOUNTER — Inpatient Hospital Stay: Payer: Medicare (Managed Care) | Admitting: Family Medicine

## 2023-06-15 DIAGNOSIS — G9340 Encephalopathy, unspecified: Secondary | ICD-10-CM | POA: Diagnosis not present

## 2023-06-15 DIAGNOSIS — A4101 Sepsis due to Methicillin susceptible Staphylococcus aureus: Secondary | ICD-10-CM | POA: Diagnosis not present

## 2023-06-16 ENCOUNTER — Telehealth (INDEPENDENT_AMBULATORY_CARE_PROVIDER_SITE_OTHER): Payer: Medicare (Managed Care) | Admitting: Family Medicine

## 2023-06-16 ENCOUNTER — Encounter: Payer: Self-pay | Admitting: Family Medicine

## 2023-06-16 VITALS — BP 128/58 | HR 73

## 2023-06-16 DIAGNOSIS — M48062 Spinal stenosis, lumbar region with neurogenic claudication: Secondary | ICD-10-CM | POA: Diagnosis not present

## 2023-06-16 DIAGNOSIS — R5382 Chronic fatigue, unspecified: Secondary | ICD-10-CM | POA: Diagnosis not present

## 2023-06-16 NOTE — Progress Notes (Signed)
Patient ID: [name], male   DOB: October 08, 1931, 89 y.o.   MRN: [id number]   Virtual Visit via Video Note  I connected with [name] on 06/16/23 at 11:30 AM EDT by a video enabled telemedicine application and verified that I am speaking with the correct person using two identifiers.  Location patient: home Location provider:work or home office Persons participating in the virtual visit: patient, provider  I discussed the limitations of evaluation and management by telemedicine and the availability of in person appointments. The patient expressed understanding and agreed to proceed.   HPI: [name] is seen today accompanied by wife for bilateral lower extremity weakness and progressive left lower extremity pain.  Refer to last note for details.  Was seen here on the 16th.  Progressive sciatica type symptoms on the left.  Known severe lumbar stenosis with neurogenic claudication.  Has seen multiple back specialist in the past and about 6 weeks ago had injection which did not help much if any.  We increased his gabapentin currently up to 300 mg 3 times daily which does help some with pain but perhaps makes him more sedated.  Supplementing with tramadol as needed.  1 day after he was seen here he tried to stand up but had bilateral lower extremity weakness and wife states his legs "gave out ".  No urine or stool incontinence.  Last MRI scan was through neurosurgeons office.  They do have home PT already in place.  He had become fairly disabled even prior to a week ago but certainly worse over the past week.  Family is looking at getting a couple of different types of lifts to help him with transfers.  They will likely need a letter of medical necessity.  His wife is primary caregiver and she is unable to lift him and they certainly do need some increased capability for helping him transfer.  No recent fall within the past week.  No reported fever.   ROS: See pertinent positives and negatives per  HPI.  Past Medical History:  Diagnosis Date   Actinic keratosis 03/13/2009   Arthritis    BENIGN PROSTATIC HYPERTROPHY, HX OF 04/07/2009   CARCINOMA, SKIN, SQUAMOUS CELL 09/11/2009   COLONIC POLYPS, HX OF 03/13/2009   Essential hypertension    GERD 03/13/2009   Hyperlipidemia    INSOMNIA, CHRONIC 09/11/2009   Persistent atrial fibrillation (HCC) 04/07/2009   a. s/p afib ablation at [facility] x 2 in 2010.   POLYPECTOMY, HX OF 04/07/2009   Premature atrial contractions    PULMONARY NODULE 09/11/2009   PVC's (premature ventricular contractions)    Rosacea 03/13/2009   SKIN CANCER, HX OF 03/13/2009    Past Surgical History:  Procedure Laterality Date   ABLATION  2010   x2   COLONOSCOPY W/ BIOPSIES AND POLYPECTOMY     EYE SURGERY Bilateral    cataracts   LUMBAR DISC SURGERY     RUPTURE   LUMBAR LAMINECTOMY/DECOMPRESSION MICRODISCECTOMY N/A 04/20/2014   Procedure: LUMBAR TWO-THREE, LUMBAR THREE-FOUR, LUMAR FOUR-FIVE LAMINECTOMIES;  Surgeon: Tressie Stalker, MD;  Location: [facility];  Service: Neurosurgery;  Laterality: N/A;   POLYPECTOMY     SKIN CANCER EXCISION     TEE WITHOUT CARDIOVERSION N/A 06/01/2023   Procedure: TRANSESOPHAGEAL ECHOCARDIOGRAM;  Surgeon: Pricilla Riffle, MD;  Location: [facility] INVASIVE CV LAB;  Service: Cardiovascular;  Laterality: N/A;    Family History  Problem Relation Age of Onset   Cancer Mother    Cancer Father 24  COLON   Heart attack Father    Heart disease Father    Heart disease Sister    CAD Sister    Heart disease Brother    CAD Brother    CAD Brother    Heart disease Brother     SOCIAL HX: Non-smoker   Current Outpatient Medications:    acetaminophen (TYLENOL) 500 MG tablet, Take 500 mg by mouth every 6 (six) hours as needed for mild pain., Disp: , Rfl:    amLODipine (NORVASC) 2.5 MG tablet, TAKE 1 TABLET DAILY, Disp: 90 tablet, Rfl: 1   budesonide-formoterol (BREYNA) 80-4.5 MCG/ACT inhaler, Inhale 2 puffs into the lungs in the morning and at  bedtime., Disp: 10.2 g, Rfl: 3   ceFAZolin (ANCEF) IVPB, Inject 2 g into the vein every 8 (eight) hours for 23 days. Indication:  MSSA bacteremia First Dose: Yes Last Day of Therapy:  06/24/23 Labs - Once weekly:  CBC/D and BMP, Labs - Once weekly: ESR and CRP Method of administration: IV Push Pull PICC line at the completion of IV antibiotic therapy Method of administration may be changed at the discretion of home infusion pharmacist based upon assessment of the patient and/or caregiver's ability to self-administer the medication ordered., Disp: 69 Units, Rfl: 0   ELIQUIS 5 MG TABS tablet, TAKE 1 TABLET BY MOUTH TWICE A DAY, Disp: 180 tablet, Rfl: 1   finasteride (PROSCAR) 5 MG tablet, TAKE 1 TABLET (5 MG TOTAL) BY MOUTH DAILY., Disp: 90 tablet, Rfl: 1   furosemide (LASIX) 40 MG tablet, TAKE 1 TABLET EVERY DAY AS NEEDED FOR LEG SWELLING, Disp: 90 tablet, Rfl: 0   gabapentin (NEURONTIN) 100 MG capsule, Take 100 mg by mouth 3 (three) times daily., Disp: , Rfl:    glucosamine-chondroitin 500-400 MG tablet, Take 2 tablets by mouth daily., Disp: , Rfl:    lansoprazole (PREVACID) 30 MG capsule, TAKE 1 CAPSULE EVERY DAY AT 12 NOON., Disp: 90 capsule, Rfl: 1   lovastatin (MEVACOR) 20 MG tablet, TAKE 1 TABLET BY MOUTH EVERYDAY AT BEDTIME, Disp: 90 tablet, Rfl: 0   metoprolol succinate (TOPROL-XL) 25 MG 24 hr tablet, Take 1 tablet (25 mg total) by mouth daily. TAKE WITH OR IMMEDIATELY FOLLOWING A MEAL., Disp: 90 tablet, Rfl: 1   potassium chloride (KLOR-CON) 10 MEQ tablet, TAKE 2 TABLETS BY MOUTH EVERY DAY, Disp: 60 tablet, Rfl: 5   tamsulosin (FLOMAX) 0.4 MG CAPS capsule, Take 1 capsule (0.4 mg total) by mouth daily., Disp: 90 capsule, Rfl: 3   vitamin B-12 (CYANOCOBALAMIN) 500 MCG tablet, Take 500 mcg by mouth daily., Disp: , Rfl:    VITAMIN D PO, Take 400 Units by mouth daily. , Disp: , Rfl:   EXAM:  VITALS per patient if applicable:  GENERAL: alert, oriented, appears well and in no acute  distress  HEENT: atraumatic, conjunttiva clear, no obvious abnormalities on inspection of external nose and ears  NECK: normal movements of the head and neck  LUNGS: on inspection no signs of respiratory distress, breathing rate appears normal, no obvious gross SOB, gasping or wheezing  CV: no obvious cyanosis  MS: moves all visible extremities without noticeable abnormality  PSYCH/NEURO: pleasant and cooperative, no obvious depression or anxiety, speech and thought processing grossly intact  ASSESSMENT AND PLAN:  Discussed the following assessment and plan:  Mr. Certo has prior history of back surgery with history of bilateral severe foraminal stenosis at multiple levels.  Has been followed by neurosurgical specialist and has had recent steroid injections  which have not helped much.  He is not a candidate for further surgery.  He is currently on gabapentin 300 mg 3 times daily and supplements with tramadol.  Would like to avoid further opioids unless his pain becomes intense.  -We do concur with family that he needs lifts to try to help with changing positions.  His wife of is incapable of lifting him up. They will try to get [place] more specific information so that we can provide a letter of medical necessity. -Continue home physical therapy which is currently in process -Continue gabapentin 300 mg every 8 hours. -Continue to supplement with tramadol as needed -They do have home urinal to use for urination.       I discussed the assessment and treatment plan with the patient. The patient was provided an opportunity to ask questions and all were answered. The patient agreed with the plan and demonstrated an understanding of the instructions.   The patient was advised to call back or seek an in-person evaluation if the symptoms worsen or if the condition fails to improve as anticipated.     Evelena Peat, MD

## 2023-06-22 ENCOUNTER — Encounter: Payer: Self-pay | Admitting: Internal Medicine

## 2023-06-22 ENCOUNTER — Other Ambulatory Visit: Payer: Self-pay

## 2023-06-22 ENCOUNTER — Telehealth: Payer: Self-pay

## 2023-06-22 ENCOUNTER — Ambulatory Visit (INDEPENDENT_AMBULATORY_CARE_PROVIDER_SITE_OTHER): Payer: Medicare (Managed Care) | Admitting: Internal Medicine

## 2023-06-22 VITALS — BP 154/68 | HR 76 | Temp 97.4°F | Wt 187.0 lb

## 2023-06-22 DIAGNOSIS — A4101 Sepsis due to Methicillin susceptible Staphylococcus aureus: Secondary | ICD-10-CM | POA: Diagnosis not present

## 2023-06-22 DIAGNOSIS — B9561 Methicillin susceptible Staphylococcus aureus infection as the cause of diseases classified elsewhere: Secondary | ICD-10-CM | POA: Diagnosis not present

## 2023-06-22 DIAGNOSIS — I890 Lymphedema, not elsewhere classified: Secondary | ICD-10-CM | POA: Diagnosis not present

## 2023-06-22 DIAGNOSIS — G4730 Sleep apnea, unspecified: Secondary | ICD-10-CM | POA: Diagnosis not present

## 2023-06-22 DIAGNOSIS — I4819 Other persistent atrial fibrillation: Secondary | ICD-10-CM | POA: Diagnosis not present

## 2023-06-22 DIAGNOSIS — N1832 Chronic kidney disease, stage 3b: Secondary | ICD-10-CM | POA: Diagnosis not present

## 2023-06-22 DIAGNOSIS — M5431 Sciatica, right side: Secondary | ICD-10-CM | POA: Diagnosis not present

## 2023-06-22 DIAGNOSIS — R7881 Bacteremia: Secondary | ICD-10-CM

## 2023-06-22 DIAGNOSIS — J189 Pneumonia, unspecified organism: Secondary | ICD-10-CM | POA: Diagnosis not present

## 2023-06-22 DIAGNOSIS — E7800 Pure hypercholesterolemia, unspecified: Secondary | ICD-10-CM | POA: Diagnosis not present

## 2023-06-22 DIAGNOSIS — I129 Hypertensive chronic kidney disease with stage 1 through stage 4 chronic kidney disease, or unspecified chronic kidney disease: Secondary | ICD-10-CM | POA: Diagnosis not present

## 2023-06-22 DIAGNOSIS — M1990 Unspecified osteoarthritis, unspecified site: Secondary | ICD-10-CM | POA: Diagnosis not present

## 2023-06-22 DIAGNOSIS — M48062 Spinal stenosis, lumbar region with neurogenic claudication: Secondary | ICD-10-CM | POA: Diagnosis not present

## 2023-06-22 DIAGNOSIS — G9340 Encephalopathy, unspecified: Secondary | ICD-10-CM | POA: Diagnosis not present

## 2023-06-22 DIAGNOSIS — N401 Enlarged prostate with lower urinary tract symptoms: Secondary | ICD-10-CM | POA: Diagnosis not present

## 2023-06-22 NOTE — Telephone Encounter (Signed)
Received call from RN with [facility] with multiple questions. Would like to know if they should continue with labs today since picc is being pulled Wednesday, If okay to pull picc Wednesday after last dose of IV antbx ,and if pt should still have picc dressing change today  Informed RN that orders have been called to [facility]. Requested she continue with labs and picc dressing per opat. Juanita Laster, RMA

## 2023-06-22 NOTE — Telephone Encounter (Signed)
Per  Dr. Drue Second called [facility] to pull picc after last dose of IV antbx. Spoke with Jonny Ruiz who was able to take verbal. Orders repeated and confirmed before ending call.  Phone: [phone] Juanita Laster, RMA

## 2023-07-02 DIAGNOSIS — Z7901 Long term (current) use of anticoagulants: Secondary | ICD-10-CM | POA: Diagnosis not present

## 2023-07-02 DIAGNOSIS — Z85828 Personal history of other malignant neoplasm of skin: Secondary | ICD-10-CM | POA: Diagnosis not present

## 2023-07-02 DIAGNOSIS — K5900 Constipation, unspecified: Secondary | ICD-10-CM | POA: Diagnosis not present

## 2023-07-02 DIAGNOSIS — R911 Solitary pulmonary nodule: Secondary | ICD-10-CM | POA: Diagnosis not present

## 2023-07-02 DIAGNOSIS — R3911 Hesitancy of micturition: Secondary | ICD-10-CM | POA: Diagnosis not present

## 2023-07-02 DIAGNOSIS — Z9181 History of falling: Secondary | ICD-10-CM | POA: Diagnosis not present

## 2023-07-02 DIAGNOSIS — Z860100 Personal history of colon polyps, unspecified: Secondary | ICD-10-CM | POA: Diagnosis not present

## 2023-07-02 DIAGNOSIS — K219 Gastro-esophageal reflux disease without esophagitis: Secondary | ICD-10-CM | POA: Diagnosis not present

## 2023-07-02 DIAGNOSIS — Z452 Encounter for adjustment and management of vascular access device: Secondary | ICD-10-CM | POA: Diagnosis not present

## 2023-07-02 DIAGNOSIS — Z556 Problems related to health literacy: Secondary | ICD-10-CM | POA: Diagnosis not present

## 2023-07-02 DIAGNOSIS — Z87891 Personal history of nicotine dependence: Secondary | ICD-10-CM | POA: Diagnosis not present

## 2023-07-02 DIAGNOSIS — Z792 Long term (current) use of antibiotics: Secondary | ICD-10-CM | POA: Diagnosis not present

## 2023-07-02 DIAGNOSIS — A4101 Sepsis due to Methicillin susceptible Staphylococcus aureus: Secondary | ICD-10-CM | POA: Diagnosis not present

## 2023-07-03 ENCOUNTER — Encounter: Payer: Self-pay | Admitting: Family Medicine

## 2023-07-03 ENCOUNTER — Ambulatory Visit (INDEPENDENT_AMBULATORY_CARE_PROVIDER_SITE_OTHER): Payer: Medicare (Managed Care)

## 2023-07-03 ENCOUNTER — Ambulatory Visit (INDEPENDENT_AMBULATORY_CARE_PROVIDER_SITE_OTHER): Payer: Medicare (Managed Care) | Admitting: Family Medicine

## 2023-07-03 VITALS — BP 150/60 | HR 90 | Temp 98.2°F | Ht 74.0 in | Wt 184.6 lb

## 2023-07-03 DIAGNOSIS — M48062 Spinal stenosis, lumbar region with neurogenic claudication: Secondary | ICD-10-CM | POA: Diagnosis not present

## 2023-07-03 DIAGNOSIS — M25562 Pain in left knee: Secondary | ICD-10-CM | POA: Diagnosis not present

## 2023-07-03 DIAGNOSIS — M1712 Unilateral primary osteoarthritis, left knee: Secondary | ICD-10-CM | POA: Diagnosis not present

## 2023-07-03 DIAGNOSIS — S8002XA Contusion of left knee, initial encounter: Secondary | ICD-10-CM | POA: Diagnosis not present

## 2023-07-03 DIAGNOSIS — R0600 Dyspnea, unspecified: Secondary | ICD-10-CM | POA: Diagnosis not present

## 2023-07-03 MED ORDER — BUDESONIDE-FORMOTEROL FUMARATE 80-4.5 MCG/ACT IN AERO: 2.0000 | INHALATION_SPRAY | Freq: Two times a day (BID) | RESPIRATORY_TRACT | 3 refills | Status: DC

## 2023-07-03 MED ORDER — DULOXETINE HCL 30 MG PO CPEP: 30.0000 mg | ORAL_CAPSULE | Freq: Every day | ORAL | 1 refills | Status: DC

## 2023-07-03 MED ORDER — GABAPENTIN 300 MG PO CAPS: 300.0000 mg | ORAL_CAPSULE | Freq: Three times a day (TID) | ORAL | 3 refills | Status: DC

## 2023-07-03 NOTE — Progress Notes (Signed)
Established Patient Office Visit  Subjective   Patient ID: [name], male    DOB: [birth year]  Age: 89 y.o. MRN: [id number]  Chief Complaint  Patient presents with   Knee Pain    Patient complains of left knee pain, x4 weeks, Tried Tyenol    HPI   [name] is here accompanied by wife with some left knee pain and a couple recent falls.  Severe lumbar stenosis with neurogenic claudication and chronic left lumbar radiculitis.  Ambulates with a walker.  Recent episode where he was washing his hands standing at the sink and felt his left lower extremity gave way and fell.  Not sure if he landed on his knee.  He has some bruising around the left knee region noted very mild swelling.  Very little pain with weightbearing.  He has at home physical therapy working with him to try to do some strengthening exercises.  Multiple injections in the back in the past but these have provided limited relief.  He is not felt to be surgical candidate.  Currently on gabapentin 300 mg 3 times daily and supplements occasionally with Tylenol and Ultram.  These have provided minimal relief.  Frequently notes his pain to be 7 out of 10 at its worst.  Fluxes throughout the day  Also requesting refill of Breyna heller.  Feels like his respiratory status is stable on this.  He recently finished his antibiotics for methicillin sensitive staph aureus bacteremia.  PICC line is now out.  Definitive source of his infection was never determined.  Denies any significant cough at this time.  No fevers or chills.  Past Medical History:  Diagnosis Date   Actinic keratosis 03/13/2009   Arthritis    BENIGN PROSTATIC HYPERTROPHY, HX OF 04/07/2009   CARCINOMA, SKIN, SQUAMOUS CELL 09/11/2009   COLONIC POLYPS, HX OF 03/13/2009   Essential hypertension    GERD 03/13/2009   Hyperlipidemia    INSOMNIA, CHRONIC 09/11/2009   Persistent atrial fibrillation (HCC) 04/07/2009   a. s/p afib ablation at [facility] x 2 in 2010.    POLYPECTOMY, HX OF 04/07/2009   Premature atrial contractions    PULMONARY NODULE 09/11/2009   PVC's (premature ventricular contractions)    Rosacea 03/13/2009   SKIN CANCER, HX OF 03/13/2009   Past Surgical History:  Procedure Laterality Date   ABLATION  2010   x2   COLONOSCOPY W/ BIOPSIES AND POLYPECTOMY     EYE SURGERY Bilateral    cataracts   LUMBAR DISC SURGERY     RUPTURE   LUMBAR LAMINECTOMY/DECOMPRESSION MICRODISCECTOMY N/A 04/20/2014   Procedure: LUMBAR TWO-THREE, LUMBAR THREE-FOUR, LUMAR FOUR-FIVE LAMINECTOMIES;  Surgeon: Tressie Stalker, MD;  Location: [facility]URO [facility];  Service: Neurosurgery;  Laterality: N/A;   POLYPECTOMY     SKIN CANCER EXCISION     TEE WITHOUT CARDIOVERSION N/A 06/01/2023   Procedure: TRANSESOPHAGEAL ECHOCARDIOGRAM;  Surgeon: Pricilla Riffle, MD;  Location: [facility] INVASIVE CV LAB;  Service: Cardiovascular;  Laterality: N/A;    reports that he quit smoking about 35 years ago. His smoking use included cigarettes. He started smoking about 65 years ago. He has a 30 pack-year smoking history. He has quit using smokeless tobacco.  His smokeless tobacco use included chew. He reports that he does not drink alcohol and does not use drugs. family history includes CAD in his brother, brother, and sister; Cancer in his mother; Cancer (age of onset: 90) in his father; Heart attack in his father; Heart disease in his  brother, brother, father, and sister. Allergies  Allergen Reactions   Amoxicillin-Pot Clavulanate Diarrhea and Nausea Only   Losartan Other (See Comments) and Nausea And Vomiting    Elevated creatinine levels Elevated creatinine levels    Review of Systems  Constitutional:  Negative for chills and fever.  Respiratory:  Negative for cough, shortness of breath and wheezing.   Cardiovascular:  Negative for chest pain.  Genitourinary:  Negative for dysuria.  Musculoskeletal:  Positive for back pain.      Objective:     BP (!) 150/60 (BP Location: Left Arm,  Patient Position: Sitting, Cuff Size: Normal)   Pulse 90   Temp 98.2 F (36.8 C) (Oral)   Ht 6\' 2"  (1.88 m)   Wt 184 lb 9.6 oz (83.7 kg)   SpO2 97%   BMI 23.70 kg/m  BP Readings from Last 3 Encounters:  07/03/23 (!) 150/60  06/22/23 (!) 154/68  06/16/23 (!) 128/58   Wt Readings from Last 3 Encounters:  07/03/23 184 lb 9.6 oz (83.7 kg)  06/22/23 187 lb (84.8 kg)  06/10/23 182 lb 12.8 oz (82.9 kg)      Physical Exam Vitals reviewed.  Constitutional:      General: He is not in acute distress. Cardiovascular:     Rate and Rhythm: Normal rate and regular rhythm.  Pulmonary:     Effort: Pulmonary effort is normal.     Breath sounds: No wheezing or rales.  Musculoskeletal:     Comments: Left knee reveals some diffuse bruising.  No effusion.  He has good range of motion with flexion and extension.  Has some mild medial tenderness around the proximal tibia region.  Neurological:     Mental Status: He is alert.      No results found for any visits on 07/03/23.  Last CBC Lab Results  Component Value Date   WBC 5.6 06/01/2023   HGB 9.9 (L) 06/01/2023   HCT 31.5 (L) 06/01/2023   MCV 99.7 06/01/2023   MCH 31.3 06/01/2023   RDW 16.6 (H) 06/01/2023   PLT 181 06/01/2023   Last metabolic panel Lab Results  Component Value Date   GLUCOSE 115 (H) 06/01/2023   NA 140 06/01/2023   K 3.5 06/01/2023   CL 103 06/01/2023   CO2 28 06/01/2023   BUN 24 (H) 06/01/2023   CREATININE 1.52 (H) 06/01/2023   GFRNONAA 44 (L) 06/01/2023   CALCIUM 8.4 (L) 06/01/2023   PHOS 3.8 06/01/2023   PROT 6.1 (L) 05/26/2023   ALBUMIN 2.8 (L) 06/01/2023   LABGLOB 1.9 11/15/2020   AGRATIO 2.2 11/15/2020   BILITOT 1.1 05/26/2023   ALKPHOS 53 05/26/2023   AST 19 05/26/2023   ALT 13 05/26/2023   ANIONGAP 9 06/01/2023   Last lipids Lab Results  Component Value Date   CHOL 116 02/03/2023   HDL 30.60 (L) 02/03/2023   LDLCALC 53 02/03/2023   LDLDIRECT 91.0 09/19/2020   TRIG 165.0 (H) 02/03/2023    CHOLHDL 4 02/03/2023   Last hemoglobin A1c Lab Results  Component Value Date   HGBA1C 6.1 (H) 05/28/2023   Last thyroid functions Lab Results  Component Value Date   TSH 2.32 03/27/2020      The ASCVD Risk score (Arnett DK, et al., 2019) failed to calculate for the following reasons:   The 2019 ASCVD risk score is only valid for ages 63 to 16    Assessment & Plan:   #1 chronic lumbar stenosis pain with neurogenic claudication and  left lumbar radiculitis.  Pain suboptimally controlled on gabapentin 300 mg 3 times daily.  Discussed possible addition of low-dose Cymbalta 30 mg daily and give feedback in 3 to 4 weeks.  Consider further titration at that time if tolerating well.  May continue to supplement with Tylenol and tramadol as needed  #2 left knee pain following recent fall.  Fairly low clinical suspicion for fracture.  Obtain plain films left knee to further evaluate  #3 probable COPD.  Patient has been on combination inhaler with steroid and bronchodilator for quite some time.  Needs refills today.  Refilled for 1 year   No follow-ups on file.    Evelena Peat, MD

## 2023-07-03 NOTE — Patient Instructions (Signed)
Give me some feedback in about 3-4 weeks regarding the Cymbalta.

## 2023-07-13 ENCOUNTER — Telehealth: Payer: Self-pay

## 2023-07-13 DIAGNOSIS — Z79899 Other long term (current) drug therapy: Secondary | ICD-10-CM

## 2023-07-13 NOTE — Telephone Encounter (Signed)
Call to patient, spoke with spouse Eber Jones [employer]) and advises of elevated creatinine. [place] Citizen agrees to repeat BMET and possible dose change for eliquis, order placed.

## 2023-07-13 NOTE — Telephone Encounter (Signed)
-----   Message from Armanda Magic sent at 07/10/2023 10:53 PM EST ----- Please have patient come in for repeat BMET - last month SCr was in the 1.5 range and currently on full dose ELiquis 5mg  BID but if SCr is persistently > 1.5 then will need to drop the dose back given his age is > 89

## 2023-07-16 DIAGNOSIS — Z79899 Other long term (current) drug therapy: Secondary | ICD-10-CM | POA: Diagnosis not present

## 2023-07-17 LAB — BMP8+EGFR: EGFR: 55

## 2023-07-19 ENCOUNTER — Other Ambulatory Visit: Payer: Self-pay | Admitting: Cardiology

## 2023-07-19 ENCOUNTER — Other Ambulatory Visit: Payer: Self-pay | Admitting: Family Medicine

## 2023-07-19 DIAGNOSIS — R600 Localized edema: Secondary | ICD-10-CM

## 2023-07-25 ENCOUNTER — Other Ambulatory Visit: Payer: Self-pay | Admitting: Family Medicine

## 2023-07-27 DIAGNOSIS — M5416 Radiculopathy, lumbar region: Secondary | ICD-10-CM | POA: Diagnosis not present

## 2023-08-01 DIAGNOSIS — Z792 Long term (current) use of antibiotics: Secondary | ICD-10-CM | POA: Diagnosis not present

## 2023-08-01 DIAGNOSIS — Z8701 Personal history of pneumonia (recurrent): Secondary | ICD-10-CM | POA: Diagnosis not present

## 2023-08-01 DIAGNOSIS — K219 Gastro-esophageal reflux disease without esophagitis: Secondary | ICD-10-CM | POA: Diagnosis not present

## 2023-08-01 DIAGNOSIS — R911 Solitary pulmonary nodule: Secondary | ICD-10-CM | POA: Diagnosis not present

## 2023-08-01 DIAGNOSIS — Z556 Problems related to health literacy: Secondary | ICD-10-CM | POA: Diagnosis not present

## 2023-08-01 DIAGNOSIS — Z860100 Personal history of colon polyps, unspecified: Secondary | ICD-10-CM | POA: Diagnosis not present

## 2023-08-01 DIAGNOSIS — Z7951 Long term (current) use of inhaled steroids: Secondary | ICD-10-CM | POA: Diagnosis not present

## 2023-08-01 DIAGNOSIS — K5900 Constipation, unspecified: Secondary | ICD-10-CM | POA: Diagnosis not present

## 2023-08-01 DIAGNOSIS — Z85828 Personal history of other malignant neoplasm of skin: Secondary | ICD-10-CM | POA: Diagnosis not present

## 2023-08-01 DIAGNOSIS — Z87891 Personal history of nicotine dependence: Secondary | ICD-10-CM | POA: Diagnosis not present

## 2023-08-01 DIAGNOSIS — Z9181 History of falling: Secondary | ICD-10-CM | POA: Diagnosis not present

## 2023-08-01 DIAGNOSIS — M48062 Spinal stenosis, lumbar region with neurogenic claudication: Secondary | ICD-10-CM | POA: Diagnosis not present

## 2023-08-01 DIAGNOSIS — Z7901 Long term (current) use of anticoagulants: Secondary | ICD-10-CM | POA: Diagnosis not present

## 2023-08-03 ENCOUNTER — Telehealth: Payer: Self-pay | Admitting: Cardiology

## 2023-08-03 NOTE — Telephone Encounter (Signed)
Wife is calling in about a lab bill she got. Wife states that it should be billed to the insurance and not them. Billing told wife to call the nurse that put the order in for them to be able to billed it correctly. Please advise

## 2023-08-04 NOTE — Telephone Encounter (Signed)
Spoke to patient's spouse Eber Jones [facility]) who states she has a bill for [facility] services completed 07/16/23. Explained that on review of chart, there are no lab results from BMET ordered 07/13/23 and prior to that last labs are from October 2024. Eber Jones is unsure of when exactly labs were performed and who ordered them, but states she has an Nutritional therapist. Asked patient to bring invoice to our office, Brain Hilts understanding and agrees to plan.

## 2023-08-13 ENCOUNTER — Encounter: Payer: Self-pay | Admitting: Cardiology

## 2023-08-13 NOTE — Telephone Encounter (Signed)
Call to patient to advise that I spoke to [facility] and they state they misprocessed bill and invoice sent fo $70.19 was sent in error. This bill date will be reprocessed over the next 30-45 days. Unable to leave voicemail, mail box full on # listed on DPR.

## 2023-08-28 NOTE — Telephone Encounter (Signed)
 Call to patient to advise that I spoke to [facility] and they state they misprocessed bill and invoice sent fo $70.19 was sent in error. This bill date will be reprocessed over the next 30-45 days. Unable to leave voicemail, mail box full on # listed on DPR.

## 2023-09-27 ENCOUNTER — Other Ambulatory Visit: Payer: Self-pay | Admitting: Family Medicine

## 2023-10-16 ENCOUNTER — Other Ambulatory Visit: Payer: Self-pay | Admitting: Family Medicine

## 2023-10-16 DIAGNOSIS — R600 Localized edema: Secondary | ICD-10-CM

## 2023-11-11 ENCOUNTER — Other Ambulatory Visit: Payer: Self-pay | Admitting: Cardiology

## 2023-11-11 DIAGNOSIS — I4891 Unspecified atrial fibrillation: Secondary | ICD-10-CM

## 2023-11-11 NOTE — Telephone Encounter (Addendum)
 Eliquis 5mg  refill request received. Patient is 89 years old, weight-83.7kg, Crea-1.52 on 06/01/23, Diagnosis-Afib, and last seen by Robin Searing on 04/16/23. Dose is appropriate based on dosing criteria.   Will send to PharmD to review dose.  Per Thayer Ohm PharmD will continue eliquis 5mg  as new crea level 1.25 on 07/16/23. Will send refill at this time

## 2023-11-19 ENCOUNTER — Ambulatory Visit: Payer: Medicare Other | Admitting: Family Medicine

## 2023-11-19 DIAGNOSIS — Z0000 Encounter for general adult medical examination without abnormal findings: Secondary | ICD-10-CM

## 2023-11-19 NOTE — Patient Instructions (Signed)
 I really enjoyed getting to talk with you today! I am available on Tuesdays and Thursdays for virtual visits if you have any questions or concerns, or if I can be of any further assistance.   CHECKLIST FROM ANNUAL WELLNESS VISIT:  -Follow up (please call to schedule if not scheduled after visit):   -yearly for annual wellness visit with primary care office  Here is a list of your preventive care/health maintenance measures and the plan for each if any are due:  PLAN For any measures below that may be due:  -can get the vaccines at the pharmacy - please let us know if you do so that we can update your record  Health Maintenance  Topic Date Due   COVID-19 Vaccine (5 - 2024-25 season) 12/05/2023 (Originally 04/26/2023)   DTaP/Tdap/Td (2 - Tdap) 11/18/2024 (Originally 08/29/2020)   Zoster Vaccines- Shingrix (1 of 2) 11/18/2024 (Originally 02/19/1951)   Medicare Annual Wellness (AWV)  11/18/2024   Pneumonia Vaccine 33+ Years old  Completed   INFLUENZA VACCINE  Completed   HPV VACCINES  Aged Out    -See a dentist at least yearly  -Get your eyes checked and then per your eye specialist's recommendations  -Other issues addressed today:   -I have included below further information regarding a healthy whole foods based diet, physical activity guidelines for adults, stress management and opportunities for social connections. I hope you find this information useful.   -----------------------------------------------------------------------------------------------------------------------------------------------------------------------------------------------------------------------------------------------------------    NUTRITION: -eat real food: lots of colorful vegetables (half the plate) and fruits -5-7 servings of vegetables and fruits per day (fresh or steamed is best), exp. 2 servings of vegetables with lunch and dinner and 2 servings of fruit per day. Berries and greens such as kale and  collards are great choices.  -consume on a regular basis:  fresh fruits, fresh veggies, fish, nuts, seeds, healthy oils (such as olive oil, avocado oil), whole grains (make sure for bread/pasta/crackers/etc., that the first ingredient on label contains the word "whole"), legumes. -can eat small amounts of dairy and lean meat (no larger than the palm of your hand), but avoid processed meats such as ham, bacon, lunch meat, etc. -drink water -try to avoid fast food and pre-packaged foods, processed meat, ultra processed foods/beverages (donuts, candy, etc.) -most experts advise limiting sodium to < 2300mg  per day, should limit further is any chronic conditions such as high blood pressure, heart disease, diabetes, etc. The American Heart Association advised that < 1500mg  is is ideal -try to avoid foods/beverages that contain any ingredients with names you do not recognize  -try to avoid foods/beverages  with added sugar or sweeteners/sweets  -try to avoid sweet drinks (including diet drinks): soda, juice, Gatorade, sweet tea, power drinks, diet drinks -try to avoid white rice, white bread, pasta (unless whole grain)  EXERCISE GUIDELINES FOR ADULTS: -if you wish to increase your physical activity, do so gradually and with the approval of your doctor -STOP and seek medical care immediately if you have any chest pain, chest discomfort or trouble breathing when starting or increasing exercise  -move and stretch your body, legs, feet and arms when sitting for long periods -Physical activity guidelines for optimal health in adults: -get at least 150 minutes per week of moderate exercise (can talk, but not sing); this is about 20-30 minutes of sustained activity 5-7 days per week or two 10-15 minute episodes of sustained activity 5-7 days per week -do some muscle building/resistance training/strength training at least 2 days per  week  -balance exercises 3+ days per week:   Stand somewhere where you have  something sturdy to hold onto if you lose balance    1) lift up on toes, then back down, start with 5x per day and work up to 20x   2) stand and lift one leg straight out to the side so that foot is a few inches of the floor, start with 5x each side and work up to 20x each side   3) stand on one foot, start with 5 seconds each side and work up to 20 seconds on each side  If you need ideas or help with getting more active:  -Silver sneakers https://tools.silversneakers.com  -Walk with a Doc: http://www.duncan-williams.com/  -try to include resistance (weight lifting/strength building) and balance exercises twice per week: or the following link for ideas: http://castillo-powell.com/  BuyDucts.dk  STRESS MANAGEMENT: -can try meditating, or just sitting quietly with deep breathing while intentionally relaxing all parts of your body for 5 minutes daily -if you need further help with stress, anxiety or depression please follow up with your primary doctor or contact the wonderful folks at [facility]: [phone]  SOCIAL CONNECTIONS: -options in [place] if you wish to engage in more social and exercise related activities:  -Silver sneakers https://tools.silversneakers.com  -Walk with a Doc: http://www.duncan-williams.com/  -Check out the [facility] Active Adults 50+ section on the [place] [employer] (hiking clubs, book clubs, cards and games, chess, exercise classes, aquatic classes and much more) - see the website for details: https://www.[place]-.gov/departments/parks-recreation/active-adults50  -YouTube has lots of exercise videos for different ages and abilities as well  -[facility] (a variety of indoor and outdoor inperson activities for adults). 8588050879. [address].  -Virtual Online Classes (a variety of topics): see seniorplanet.org or call  7577607695  -consider volunteering at a school, hospice center, church, senior center or elsewhere

## 2023-11-19 NOTE — Progress Notes (Signed)
 PATIENT CHECK-IN and HEALTH RISK ASSESSMENT QUESTIONNAIRE:  -completed by phone/video for upcoming Medicare Preventive Visit   Pre-Visit Check-in: 1)Vitals (height, wt, BP, etc) - record in vitals section for visit on day of visit Request home vitals (wt, BP, etc.) and enter into vitals, THEN update Vital Signs SmartPhrase below at the top of the HPI. See below.  2)Review and Update Medications, Allergies PMH, Surgeries, Social history in Epic 3)Hospitalizations in the last year with date/reason? Y, mssa bacteremia  4)Review and Update Care Team (patient's specialists) in Epic 5) Complete PHQ9 in Epic  6) Complete Fall Screening in Epic 7)Review all Health Maintenance Due and order under PCP if not done.  Medicare Wellness Patient Questionnaire:  Answer theses question about your habits: How often do you have a drink containing alcohol?n Have you ever smoked? Yes, quit over 30 years ago How many packs a day do/did you smoke? Less than 1 Do you use smokeless tobacco?n Do you use an illicit drugs?n On average, how many days per week do you engage in moderate to strenuous exercise (like a brisk walk)? Has PT several months ago, not doing much home exercises, but is getting up and walking and does his peddling device.  Typical diet: eating 3 meals a day, veggies, meats, water Answer theses question about your everyday activities: Can you perform most household chores?n Are you deaf or have significant trouble hearing? Has hearing aides, has appt next month Do you feel that you have a problem with memory?mild feels doing ok Do you feel safe at home?y Last dentist visit? Has seen dentist at least once a year 8. Do you have any difficulty performing your everyday activities? Y, wife and sitter help Are you having any difficulty walking, taking medications on your own, and or difficulty managing daily home needs?n Do you have difficulty walking or climbing stairs?y Do you have difficulty  dressing or bathing?n Do you have difficulty doing errands alone such as visiting a doctor's office or shopping?y - does not drive Do you currently have any difficulty preparing food and eating?n Do you currently have any difficulty using the toilet?n Do you have any difficulty managing your finances?n, wife helps Do you have any difficulties with housekeeping of managing your housekeeping?y   Do you have Advanced Directives in place (Living Will, Healthcare Power or Attorney)? y   Last eye Exam and location? Just had eye exam recently and is doing ok.    Do you currently use prescribed or non-prescribed narcotic or opioid pain medications?no  Do you have a history or close family history of breast, ovarian, tubal or peritoneal cancer or a family member with BRCA (breast cancer susceptibility 1 and 2) gene mutations?    ----------------------------------------------------------------------------------------------------------------------------------------------------------------------------------------------------------------------  Because this visit was a virtual/telehealth visit, some criteria may be missing or patient reported. Any vitals not documented were not able to be obtained and vitals that have been documented are patient reported.    MEDICARE ANNUAL PREVENTIVE CARE VISIT WITH PROVIDER (Welcome to Medicare, initial annual wellness or annual wellness exam)  Virtual Visit via Phone Note  I connected with Everitt Amber on 11/19/23  by phone  and verified that I am speaking with the correct person using two identifiers. They tried to do video but did not have a strong enough internet connection and opted to do phone instead.   Location patient: home Location provider:work or home office Persons participating in the virtual visit: patient, provider, patient's wife  Concerns and/or follow up  today: stronger over the last few months. He did have a fall last week and was  treated at urgent care - strained muscle - now doing better. Had recent eye surgery for lesion - doing ok now.   [name]  has a past medical history of Actinic keratosis (03/13/2009), Arthritis, BENIGN PROSTATIC HYPERTROPHY, HX OF (04/07/2009), CARCINOMA, SKIN, SQUAMOUS CELL (09/11/2009), COLONIC POLYPS, HX OF (03/13/2009), Essential hypertension, GERD (03/13/2009), Hyperlipidemia, INSOMNIA, CHRONIC (09/11/2009), Persistent atrial fibrillation (HCC) (04/07/2009), POLYPECTOMY, HX OF (04/07/2009), Premature atrial contractions, PULMONARY NODULE (09/11/2009), PVC's (premature ventricular contractions), Rosacea (03/13/2009), and SKIN CANCER, HX OF (03/13/2009).   See HM section in Epic for other details of completed HM.    ROS: negative for report of fevers, unintentional weight loss, vision changes, vision loss, hearing loss or change, chest pain, sob, hemoptysis, melena, hematochezia, hematuria, falls, bleeding or bruising, thoughts of suicide or self harm, memory loss  Patient-completed extensive health risk assessment - reviewed and discussed with the patient: See Health Risk Assessment completed with patient prior to the visit either above or in recent phone note. This was reviewed in detailed with the patient today and appropriate recommendations, orders and referrals were placed as needed per Summary below and patient instructions.   Review of Medical History: -PMH, PSH, Family History and current specialty and care providers reviewed and updated and listed below   Patient Care Team: Kristian Covey, MD as PCP - General Quintella Reichert, MD as PCP - Cardiology (Cardiology) Verner Chol, [facility] (Inactive) as Pharmacist (Pharmacist)   Past Medical History:  Diagnosis Date   Actinic keratosis 03/13/2009   Arthritis    BENIGN PROSTATIC HYPERTROPHY, HX OF 04/07/2009   CARCINOMA, SKIN, SQUAMOUS CELL 09/11/2009   COLONIC POLYPS, HX OF 03/13/2009   Essential hypertension    GERD 03/13/2009    Hyperlipidemia    INSOMNIA, CHRONIC 09/11/2009   Persistent atrial fibrillation (HCC) 04/07/2009   a. s/p afib ablation at [facility] x 2 in 2010.   POLYPECTOMY, HX OF 04/07/2009   Premature atrial contractions    PULMONARY NODULE 09/11/2009   PVC's (premature ventricular contractions)    Rosacea 03/13/2009   SKIN CANCER, HX OF 03/13/2009    Past Surgical History:  Procedure Laterality Date   ABLATION  2010   x2   COLONOSCOPY W/ BIOPSIES AND POLYPECTOMY     EYE SURGERY Bilateral    cataracts   LUMBAR DISC SURGERY     RUPTURE   LUMBAR LAMINECTOMY/DECOMPRESSION MICRODISCECTOMY N/A 04/20/2014   Procedure: LUMBAR TWO-THREE, LUMBAR THREE-FOUR, LUMAR FOUR-FIVE LAMINECTOMIES;  Surgeon: Tressie Stalker, MD;  Location: [facility]UR[facility]S;  Service: Neurosurgery;  Laterality: N/A;   POLYPECTOMY     SKIN CANCER EXCISION     TEE WITHOUT CARDIOVERSION N/A 06/01/2023   Procedure: TRANSESOPHAGEAL ECHOCARDIOGRAM;  Surgeon: Pricilla Riffle, MD;  Location: [facility] INVASIVE CV LAB;  Service: Cardiovascular;  Laterality: N/A;    Social History   Socioeconomic History   Marital status: Married    Spouse name: Not on file   Number of children: Not on file   Years of education: Not on file   Highest education level: Bachelor's degree (e.g., BA, AB, BS)  Occupational History   Not on file  Tobacco Use   Smoking status: Former    Current packs/day: 0.00    Average packs/day: 1 pack/day for 30.0 years (30.0 ttl pk-yrs)    Types: Cigarettes    Start date: 03/13/1958    Quit date: 03/13/1988  Years since quitting: 35.7   Smokeless tobacco: Former    Types: Chew  Substance and Sexual Activity   Alcohol use: No   Drug use: No   Sexual activity: Not on file  Other Topics Concern   Not on file  Social History Narrative   Not on file   Social Drivers of Health   Financial Resource Strain: Low Risk  (05/01/2022)   Overall Financial Resource Strain (CARDIA)    Difficulty of Paying Living Expenses: Not hard at all   Food Insecurity: No Food Insecurity (05/28/2023)   Hunger Vital Sign    Worried About Running Out of Food in the Last Year: Never true    Ran Out of Food in the Last Year: Never true  Transportation Needs: No Transportation Needs (05/28/2023)   PRAPARE - Administrator, Civil Service (Medical): No    Lack of Transportation (Non-Medical): No  Physical Activity: Unknown (05/01/2022)   Exercise Vital Sign    Days of Exercise per Week: 0 days    Minutes of Exercise per Session: Not on file  Stress: No Stress Concern Present (05/01/2022)   [employer] of Occupational Health - Occupational Stress Questionnaire    Feeling of Stress : Not at all  Social Connections: Socially Integrated (05/01/2022)   Social Connection and Isolation Panel [NHANES]    Frequency of Communication with Friends and Family: More than three times a week    Frequency of Social Gatherings with Friends and Family: Not on file    Attends Religious Services: More than 4 times per year    Active Member of [employer] or Organizations: Yes    Attends Engineer, structural: More than 4 times per year    Marital Status: Married  Catering manager Violence: Not At Risk (05/28/2023)   Humiliation, Afraid, Rape, and Kick questionnaire    Fear of Current or Ex-Partner: No    Emotionally Abused: No    Physically Abused: No    Sexually Abused: No    Family History  Problem Relation Age of Onset   Cancer Mother    Cancer Father 81       COLON   Heart attack Father    Heart disease Father    Heart disease Sister    CAD Sister    Heart disease Brother    CAD Brother    CAD Brother    Heart disease Brother     Current Outpatient Medications on File Prior to Visit  Medication Sig Dispense Refill   acetaminophen (TYLENOL) 500 MG tablet Take 500 mg by mouth every 6 (six) hours as needed for mild pain.     amLODipine (NORVASC) 2.5 MG tablet TAKE 1 TABLET DAILY 90 tablet 1   budesonide-formoterol (BREYNA) 80-4.5  MCG/ACT inhaler Inhale 2 puffs into the lungs in the morning and at bedtime. 30.6 g 3   DULoxetine (CYMBALTA) 30 MG capsule TAKE 1 CAPSULE BY MOUTH EVERY DAY 90 capsule 1   ELIQUIS 5 MG TABS tablet TAKE 1 TABLET BY MOUTH TWICE A DAY 180 tablet 1   finasteride (PROSCAR) 5 MG tablet TAKE 1 TABLET (5 MG TOTAL) BY MOUTH DAILY. 90 tablet 1   furosemide (LASIX) 40 MG tablet TAKE 1 TABLET EVERY DAY AS NEEDED FOR LEG SWELLING 90 tablet 0   gabapentin (NEURONTIN) 300 MG capsule Take 1 capsule (300 mg total) by mouth 3 (three) times daily. 90 capsule 3   glucosamine-chondroitin 500-400 MG tablet Take 2 tablets by  mouth daily.     lansoprazole (PREVACID) 30 MG capsule TAKE 1 CAPSULE EVERY DAY AT 12 NOON. 90 capsule 1   lovastatin (MEVACOR) 20 MG tablet TAKE 1 TABLET BY MOUTH EVERYDAY AT BEDTIME 90 tablet 0   metoprolol succinate (TOPROL-XL) 25 MG 24 hr tablet Take 1 tablet (25 mg total) by mouth daily. TAKE WITH OR IMMEDIATELY FOLLOWING A MEAL. 90 tablet 1   potassium chloride (KLOR-CON) 10 MEQ tablet TAKE 2 TABLETS BY MOUTH EVERY DAY 60 tablet 5   tamsulosin (FLOMAX) 0.4 MG CAPS capsule Take 1 capsule (0.4 mg total) by mouth daily. 90 capsule 3   vitamin B-12 (CYANOCOBALAMIN) 500 MCG tablet Take 500 mcg by mouth daily.     VITAMIN D PO Take 400 Units by mouth daily.      No current facility-administered medications on file prior to visit.    Allergies  Allergen Reactions   Amoxicillin-Pot Clavulanate Diarrhea and Nausea Only   Losartan Other (See Comments) and Nausea And Vomiting    Elevated creatinine levels Elevated creatinine levels       Physical Exam Vitals requested from patient and listed below if patient had equipment and was able to obtain at home for this virtual visit: There were no vitals filed for this visit. Estimated body mass index is 23.7 kg/m as calculated from the following:   Height as of 07/03/23: 6\' 2"  (1.88 m).   Weight as of 07/03/23: 184 lb 9.6 oz (83.7 kg).  EKG  (optional): deferred due to virtual visit  GENERAL: alert, oriented, no acute distress detected; full vision exam deferred due to pandemic and/or virtual encounter PSYCH/NEURO: pleasant and cooperative, no obvious depression or anxiety, speech and thought processing grossly intact, Cognitive function grossly intact  Flowsheet Row Video Visit from 11/18/2022 in [facility] [place]  PHQ-9 Total Score 3           11/19/2023    3:54 PM 11/18/2022   11:55 AM 11/20/2021    4:39 PM 03/27/2020    4:16 PM 11/03/2017    9:43 AM  Depression screen PHQ 2/9  Decreased Interest 0 0 1 0 0  Down, Depressed, Hopeless 0 0 0 0 0  PHQ - 2 Score 0 0 1 0 0  Altered sleeping  1  0   Tired, decreased energy  1  1   Change in appetite  0  3   Feeling bad or failure about yourself   0  0   Trouble concentrating  1  1   Moving slowly or fidgety/restless  0  0   Suicidal thoughts  0  0   PHQ-9 Score  3  5   Difficult doing work/chores    Not difficult at all        03/27/2020    4:15 PM 12/25/2021    3:33 PM 05/01/2022    2:49 PM 11/18/2022   11:54 AM 11/19/2023    3:54 PM  Fall Risk  Falls in the past year? 0 1 1 0 1  Was there an injury with Fall? 0 1 1 0 1  Fall Risk Category Calculator 0 3 2 0 2  Fall Risk Category (Retired) Low High Moderate    (RETIRED) Patient Fall Risk Level  Moderate fall risk     Patient at Risk for Falls Due to  History of fall(s)  History of fall(s)   Fall risk Follow up  Falls evaluation completed  Falls evaluation completed  SUMMARY AND PLAN:  Encounter for Medicare annual wellness exam   Discussed applicable health maintenance/preventive health measures and advised and referred or ordered per patient preferences: -discussed vaccines due risks/benefits, advised can get at pharmacy if decides to do and to let us know so that we can update record if decides to get.  Health Maintenance  Topic Date Due   COVID-19 Vaccine (5 - 2024-25 season)  12/05/2023 (Originally 04/26/2023)   DTaP/Tdap/Td (2 - Tdap) 11/18/2024 (Originally 08/29/2020)   Zoster Vaccines- Shingrix (1 of 2) 11/18/2024 (Originally 02/19/1951)   Medicare Annual Wellness (AWV)  11/18/2024   Pneumonia Vaccine 64+ Years old  Completed   INFLUENZA VACCINE  Completed   HPV VACCINES  Aged Out     Education and counseling on the following was provided based on the above review of health and a plan/checklist for the patient, along with additional information discussed, was provided for the patient in the patient instructions :   -Provided counseling and plan for increased risk of falling if applicable per above screening. Advised increased safe exercise and safe balance exercises that can be done at home to improve balance and discussed exercise guidelines for adults with include balance exercises at least 3 days per week.  -Advised and counseled on a healthy lifestyle - including the importance of a healthy diet, regular physical activity, social connections  -Reviewed patient's current diet. Advised and counseled on a whole foods based healthy diet. A summary of a healthy diet was provided in the Patient Instructions.  -reviewed patient's current physical activity level and discussed exercise guidelines for adults. Discussed community resources and ideas for safe exercise at home to assist in meeting exercise guideline recommendations in a safe and healthy way.  -Advise yearly dental visits at minimum and regular eye exams   Follow up: see patient instructions   Patient Instructions  I really enjoyed getting to talk with you today! I am available on Tuesdays and Thursdays for virtual visits if you have any questions or concerns, or if I can be of any further assistance.   CHECKLIST FROM ANNUAL WELLNESS VISIT:  -Follow up (please call to schedule if not scheduled after visit):   -yearly for annual wellness visit with primary care office  Here is a list of your preventive  care/health maintenance measures and the plan for each if any are due:  PLAN For any measures below that may be due:  -can get the vaccines at the pharmacy - please let us know if you do so that we can update your record  Health Maintenance  Topic Date Due   COVID-19 Vaccine (5 - 2024-25 season) 12/05/2023 (Originally 04/26/2023)   DTaP/Tdap/Td (2 - Tdap) 11/18/2024 (Originally 08/29/2020)   Zoster Vaccines- Shingrix (1 of 2) 11/18/2024 (Originally 02/19/1951)   Medicare Annual Wellness (AWV)  11/18/2024   Pneumonia Vaccine 61+ Years old  Completed   INFLUENZA VACCINE  Completed   HPV VACCINES  Aged Out    -See a dentist at least yearly  -Get your eyes checked and then per your eye specialist's recommendations  -Other issues addressed today:   -I have included below further information regarding a healthy whole foods based diet, physical activity guidelines for adults, stress management and opportunities for social connections. I hope you find this information useful.   -----------------------------------------------------------------------------------------------------------------------------------------------------------------------------------------------------------------------------------------------------------    NUTRITION: -eat real food: lots of colorful vegetables (half the plate) and fruits -5-7 servings of vegetables and fruits per day (fresh or steamed is best), exp.  2 servings of vegetables with lunch and dinner and 2 servings of fruit per day. Berries and greens such as kale and collards are great choices.  -consume on a regular basis:  fresh fruits, fresh veggies, fish, nuts, seeds, healthy oils (such as olive oil, avocado oil), whole grains (make sure for bread/pasta/crackers/etc., that the first ingredient on label contains the word "whole"), legumes. -can eat small amounts of dairy and lean meat (no larger than the palm of your hand), but avoid processed meats such as  ham, bacon, lunch meat, etc. -drink water -try to avoid fast food and pre-packaged foods, processed meat, ultra processed foods/beverages (donuts, candy, etc.) -most experts advise limiting sodium to < 2300mg  per day, should limit further is any chronic conditions such as high blood pressure, heart disease, diabetes, etc. The American Heart Association advised that < 1500mg  is is ideal -try to avoid foods/beverages that contain any ingredients with names you do not recognize  -try to avoid foods/beverages  with added sugar or sweeteners/sweets  -try to avoid sweet drinks (including diet drinks): soda, juice, Gatorade, sweet tea, power drinks, diet drinks -try to avoid white rice, white bread, pasta (unless whole grain)  EXERCISE GUIDELINES FOR ADULTS: -if you wish to increase your physical activity, do so gradually and with the approval of your doctor -STOP and seek medical care immediately if you have any chest pain, chest discomfort or trouble breathing when starting or increasing exercise  -move and stretch your body, legs, feet and arms when sitting for long periods -Physical activity guidelines for optimal health in adults: -get at least 150 minutes per week of moderate exercise (can talk, but not sing); this is about 20-30 minutes of sustained activity 5-7 days per week or two 10-15 minute episodes of sustained activity 5-7 days per week -do some muscle building/resistance training/strength training at least 2 days per week  -balance exercises 3+ days per week:   Stand somewhere where you have something sturdy to hold onto if you lose balance    1) lift up on toes, then back down, start with 5x per day and work up to 20x   2) stand and lift one leg straight out to the side so that foot is a few inches of the floor, start with 5x each side and work up to 20x each side   3) stand on one foot, start with 5 seconds each side and work up to 20 seconds on each side  If you need ideas or help  with getting more active:  -Silver sneakers https://tools.silversneakers.com  -Walk with a Doc: http://www.duncan-williams.com/  -try to include resistance (weight lifting/strength building) and balance exercises twice per week: or the following link for ideas: http://castillo-powell.com/  BuyDucts.dk  STRESS MANAGEMENT: -can try meditating, or just sitting quietly with deep breathing while intentionally relaxing all parts of your body for 5 minutes daily -if you need further help with stress, anxiety or depression please follow up with your primary doctor or contact the wonderful folks at [facility]: [phone]  SOCIAL CONNECTIONS: -options in [place] if you wish to engage in more social and exercise related activities:  -Silver sneakers https://tools.silversneakers.com  -Walk with a Doc: http://www.duncan-williams.com/  -Check out the [facility] Active Adults 50+ section on the [place] of [employer] (hiking clubs, book clubs, cards and games, chess, exercise classes, aquatic classes and much more) - see the website for details: https://www.[place].gov/departments/parks-recreation/active-adults50  -YouTube has lots of exercise videos for different ages and abilities as well  -  [facility] (a variety of indoor and outdoor inperson activities for adults). 3343321281. [address].  -Virtual Online Classes (a variety of topics): see seniorplanet.org or call ([phone])[phone]  -consider volunteering at a school, hospice center, church, senior center or elsewhere            Terressa Koyanagi, DO

## 2023-12-25 ENCOUNTER — Other Ambulatory Visit: Payer: Self-pay | Admitting: Family Medicine

## 2024-01-13 ENCOUNTER — Other Ambulatory Visit: Payer: Self-pay | Admitting: Family Medicine

## 2024-01-13 DIAGNOSIS — R600 Localized edema: Secondary | ICD-10-CM

## 2024-01-18 ENCOUNTER — Other Ambulatory Visit: Payer: Self-pay | Admitting: Cardiology

## 2024-01-19 ENCOUNTER — Other Ambulatory Visit: Payer: Self-pay

## 2024-01-19 MED ORDER — LANSOPRAZOLE 30 MG PO CPDR: DELAYED_RELEASE_CAPSULE | ORAL | 0 refills | Status: DC

## 2024-02-03 ENCOUNTER — Ambulatory Visit: Payer: Self-pay

## 2024-02-03 NOTE — Telephone Encounter (Signed)
 FYI Only or Action Required?: FYI only for provider  Patient was last seen in primary care on 11/19/2023 by Maurie Southern, DO. Called Nurse Triage reporting Leg Swelling. Symptoms began a week ago. Interventions attempted: Other: leg elevation. Symptoms are: gradually worsening.  Triage Disposition: See PCP When Office is Open (Within 3 Days)  Patient/caregiver understands and will follow disposition?: Yes  Copied from CRM [id number]. Topic: Clinical - Red Word Triage >> Feb 03, 2024  4:16 PM Rachelle R wrote: Red Word that prompted transfer to Nurse Triage: Patient is experiencing some pain and swelling in both his ankles and feet. Been happening off and on the last few weeks, but have gotten worse over time. Reason for Disposition  [1] MILD swelling of both ankles (i.e., pedal edema) AND [2] new-onset or worsening  Answer Assessment - Initial Assessment Questions 1. ONSET: When did the pain start?      Ongoing and worsening over this week. 2. LOCATION: Where is the pain located?      Bilateral leg pain  3. PAIN: How bad is the pain?    (Scale 1-10; or mild, moderate, severe)   -  MILD (1-3): doesn't interfere with normal activities    -  MODERATE (4-7): interferes with normal activities (e.g., work or school) or awakens from sleep, limping    -  SEVERE (8-10): excruciating pain, unable to do any normal activities, unable to walk     Mild to moderate 4. WORK OR EXERCISE: Has there been any recent work or exercise that involved this part of the body?      N/a 5. CAUSE: What do you think is causing the leg pain?     N/a 6. OTHER SYMPTOMS: Do you have any other symptoms? (e.g., chest pain, back pain, breathing difficulty, swelling, rash, fever, numbness, weakness)     Not tingling now but has in the past 7. PREGNANCY: Is there any chance you are pregnant? When was your last menstrual period?     N/a  Answer Assessment - Initial Assessment Questions 1. ONSET: When did the  swelling start? (e.g., minutes, hours, days)     Worsening over last week 2. LOCATION: What part of the leg is swollen?  Are both legs swollen or just one leg?     Bilateral leg swelling  3. SEVERITY: How bad is the swelling? (e.g., localized; mild, moderate, severe)   - Localized: Small area of swelling localized to one leg.   - MILD pedal edema: Swelling limited to foot and ankle, pitting edema < 1/4 inch (6 mm) deep, rest and elevation eliminate most or all swelling.   - MODERATE edema: Swelling of lower leg to knee, pitting edema > 1/4 inch (6 mm) deep, rest and elevation only partially reduce swelling.   - SEVERE edema: Swelling extends above knee, facial or hand swelling present.      Moderate to severe 4. REDNESS: Does the swelling look red or infected?     some 5. PAIN: Is the swelling painful to touch? If Yes, ask: How painful is it?   (Scale 1-10; mild, moderate or severe)     Mild to moderate 6. FEVER: Do you have a fever? If Yes, ask: What is it, how was it measured, and when did it start?      no 7. CAUSE: What do you think is causing the leg swelling?     unknown 8. MEDICAL HISTORY: Do you have a history of blood clots (e.g., DVT),  cancer, heart failure, kidney disease, or liver failure?     N/a 9. RECURRENT SYMPTOM: Have you had leg swelling before? If Yes, ask: When was the last time? What happened that time?     Normally has some swelling that comes & goes but never stays & has never had this much 10. OTHER SYMPTOMS: Do you have any other symptoms? (e.g., chest pain, difficulty breathing)       Pain/discomfort 11. PREGNANCY: Is there any chance you are pregnant? When was your last menstrual period?       no  Protocols used: Leg Pain-A-AH, Leg Swelling and Edema-A-AH

## 2024-02-04 NOTE — Telephone Encounter (Signed)
 Patient's wife informed of the message below and voiced understanding

## 2024-02-05 ENCOUNTER — Ambulatory Visit (INDEPENDENT_AMBULATORY_CARE_PROVIDER_SITE_OTHER): Admitting: Family Medicine

## 2024-02-05 VITALS — BP 146/70 | HR 85 | Temp 97.6°F | Wt 181.0 lb

## 2024-02-05 DIAGNOSIS — M25471 Effusion, right ankle: Secondary | ICD-10-CM | POA: Diagnosis not present

## 2024-02-05 DIAGNOSIS — Z5181 Encounter for therapeutic drug level monitoring: Secondary | ICD-10-CM

## 2024-02-05 DIAGNOSIS — R7303 Prediabetes: Secondary | ICD-10-CM | POA: Diagnosis not present

## 2024-02-05 DIAGNOSIS — M25472 Effusion, left ankle: Secondary | ICD-10-CM

## 2024-02-05 DIAGNOSIS — Z79899 Other long term (current) drug therapy: Secondary | ICD-10-CM

## 2024-02-05 NOTE — Patient Instructions (Signed)
 Increase the Furosemide  to two tablets every Monday, Wednesday, Friday  Elevate legs frequently.

## 2024-02-05 NOTE — Progress Notes (Signed)
 Established Patient Office Visit  Subjective   Patient ID: [name], male    DOB: 04-14-1932  Age: 89 y.o. MRN: [id number]  Chief Complaint  Patient presents with   Edema    HPI   [name] has history of atrial fibrillation, hypertension, GERD, history of chronic back pain, history of multiple skin cancers, stage III chronic kidney disease, hyperlipidemia, lumbar stenosis, prediabetes.  They called recently that he was having increasing edema again in his feet and ankles.  Not aware of any recent dietary changes.  Elevates legs frequently.  Weight is actually down about 6 pounds from when he was here last October.  At baseline he takes furosemide  40 mg daily.  We had suggested when they call couple days ago to double this up to 2 daily until follow-up.  Edema may be just slightly improved.  He does take amlodipine  low-dose of 2.5 mg daily and also on gabapentin  300 mg twice daily.  Denies any recent increased dyspnea.  No orthopnea.  Echocardiogram 10/24 showed EF 55 to 60%.  He will turn 89 in a couple of weeks  Past Medical History:  Diagnosis Date   Actinic keratosis 03/13/2009   Arthritis    BENIGN PROSTATIC HYPERTROPHY, HX OF 04/07/2009   CARCINOMA, SKIN, SQUAMOUS CELL 09/11/2009   COLONIC POLYPS, HX OF 03/13/2009   Essential hypertension    GERD 03/13/2009   Hyperlipidemia    INSOMNIA, CHRONIC 09/11/2009   Persistent atrial fibrillation (HCC) 04/07/2009   a. s/p afib ablation at [facility] x 2 in 2010.   POLYPECTOMY, HX OF 04/07/2009   Premature atrial contractions    PULMONARY NODULE 09/11/2009   PVC's (premature ventricular contractions)    Rosacea 03/13/2009   SKIN CANCER, HX OF 03/13/2009   Past Surgical History:  Procedure Laterality Date   ABLATION  2010   x2   COLONOSCOPY W/ BIOPSIES AND POLYPECTOMY     EYE SURGERY Bilateral    cataracts   LUMBAR DISC SURGERY     RUPTURE   LUMBAR LAMINECTOMY/DECOMPRESSION MICRODISCECTOMY N/A 04/20/2014   Procedure: LUMBAR  TWO-THREE, LUMBAR THREE-FOUR, LUMAR FOUR-FIVE LAMINECTOMIES;  Surgeon: Garry Kansas, MD;  Location: [facility]URO ORS;  Service: Neurosurgery;  Laterality: N/A;   POLYPECTOMY     SKIN CANCER EXCISION     TEE WITHOUT CARDIOVERSION N/A 06/01/2023   Procedure: TRANSESOPHAGEAL ECHOCARDIOGRAM;  Surgeon: Elmyra Haggard, MD;  Location: [facility] INVASIVE CV LAB;  Service: Cardiovascular;  Laterality: N/A;    reports that he quit smoking about 35 years ago. His smoking use included cigarettes. He started smoking about 65 years ago. He has a 30 pack-year smoking history. He has quit using smokeless tobacco.  His smokeless tobacco use included chew. He reports that he does not drink alcohol and does not use drugs. family history includes CAD in his brother, brother, and sister; Cancer in his mother; Cancer (age of onset: 31) in his father; Heart attack in his father; Heart disease in his brother, brother, father, and sister. Allergies  Allergen Reactions   Amoxicillin-Pot Clavulanate Diarrhea and Nausea Only   Losartan  Other (See Comments) and Nausea And Vomiting    Elevated creatinine levels Elevated creatinine levels    Review of Systems  Constitutional:  Negative for malaise/fatigue.  Eyes:  Negative for blurred vision.  Respiratory:  Negative for shortness of breath.   Cardiovascular:  Positive for leg swelling. Negative for chest pain and orthopnea.  Neurological:  Negative for dizziness, weakness and headaches.  Objective:     BP (!) 146/70   Pulse 85   Temp 97.6 F (36.4 C) (Oral)   Wt 181 lb (82.1 kg)   SpO2 96%   BMI 23.24 kg/m  BP Readings from Last 3 Encounters:  02/05/24 (!) 146/70  07/03/23 (!) 150/60  06/22/23 (!) 154/68   Wt Readings from Last 3 Encounters:  02/05/24 181 lb (82.1 kg)  07/03/23 184 lb 9.6 oz (83.7 kg)  06/22/23 187 lb (84.8 kg)      Physical Exam Vitals reviewed.  Constitutional:      General: He is not in acute distress.    Appearance: He is not  ill-appearing.   Cardiovascular:     Rate and Rhythm: Normal rate and regular rhythm.  Pulmonary:     Effort: Pulmonary effort is normal.     Breath sounds: Normal breath sounds. No wheezing or rales.   Musculoskeletal:     Comments: He has some pitting edema in both feet and ankles but relative sparing of the legs except for just above the ankles.   Neurological:     Mental Status: He is alert.      No results found for any visits on 02/05/24.    The ASCVD Risk score (Arnett DK, et al., 2019) failed to calculate for the following reasons:   The 2019 ASCVD risk score is only valid for ages 93 to 72    Assessment & Plan:   #1 bilateral foot and ankle edema.  He has had similar problems in the past.  Suspect multifactorial.  He does take low-dose amlodipine  and gabapentin  which can contribute.  No dyspnea.  No evidence for significant volume overload otherwise.  Weight is actually down about 6 pounds from last fall.  -Check basic metabolic panel - Elevate legs frequently - Recommend monitoring weight periodically at home - Increase furosemide  40 mg to 2 tablets every Monday, Wednesday, and Friday, and 1 tablet all other days  #2 history of prediabetes range blood sugars.  Check A1c on labs today.   No follow-ups on file.    Steven Lamy, MD

## 2024-02-06 LAB — HEMOGLOBIN A1C
Hgb A1c MFr Bld: 6.3 % — ABNORMAL HIGH (ref <5.7)
Mean Plasma Glucose: 134 mg/dL
eAG (mmol/L): 7.4 mmol/L

## 2024-02-06 LAB — BASIC METABOLIC PANEL WITH GFR
BUN/Creatinine Ratio: 24 (calc) — ABNORMAL HIGH (ref 6–22)
BUN: 36 mg/dL — ABNORMAL HIGH (ref 7–25)
CO2: 26 mmol/L (ref 20–32)
Calcium: 9 mg/dL (ref 8.6–10.3)
Chloride: 102 mmol/L (ref 98–110)
Creat: 1.48 mg/dL — ABNORMAL HIGH (ref 0.70–1.22)
Glucose, Bld: 111 mg/dL — ABNORMAL HIGH (ref 65–99)
Potassium: 4.6 mmol/L (ref 3.5–5.3)
Sodium: 139 mmol/L (ref 135–146)
eGFR: 45 mL/min/{1.73_m2} — ABNORMAL LOW (ref >=60)

## 2024-02-07 ENCOUNTER — Ambulatory Visit: Payer: Self-pay | Admitting: Family Medicine

## 2024-02-11 ENCOUNTER — Other Ambulatory Visit: Payer: Self-pay | Admitting: Cardiology

## 2024-02-11 ENCOUNTER — Telehealth: Payer: Self-pay | Admitting: Cardiology

## 2024-02-11 DIAGNOSIS — I480 Paroxysmal atrial fibrillation: Secondary | ICD-10-CM

## 2024-02-11 MED ORDER — APIXABAN 2.5 MG PO TABS: 2.5000 mg | ORAL_TABLET | Freq: Two times a day (BID) | ORAL | 3 refills | Status: DC

## 2024-02-11 NOTE — Telephone Encounter (Signed)
 Wife is calling in about patient excessive bleeding for a skin tear. She is concern about changes need to be made to patients' medication. Please advise

## 2024-02-11 NOTE — Telephone Encounter (Signed)
 Call to patient to discuss changing dose of eliquis  to 2.5 mg BID. No answer, no recent DPR on file, left message with  no identifiers asking recipient to call [place] at our office #. [facility] message sent.

## 2024-02-11 NOTE — Telephone Encounter (Signed)
 Spoke with pt's wife who states pt has a hard time with skin tears and bleeding. Pt bruises easily. Pt's wife was told at pharmacy the other day when picking up medication refill that the pt was being monitored for bleeding and bruising and she wasn't sure why they told her that. Explained that I will send this to Dr. Micael Adas and her nurse to review if changes need to be made.

## 2024-02-24 ENCOUNTER — Other Ambulatory Visit: Payer: Self-pay | Admitting: Family Medicine

## 2024-03-04 ENCOUNTER — Ambulatory Visit: Payer: Self-pay | Admitting: *Deleted

## 2024-03-04 ENCOUNTER — Other Ambulatory Visit: Payer: Self-pay

## 2024-03-04 ENCOUNTER — Inpatient Hospital Stay (HOSPITAL_COMMUNITY)
Admission: EM | Admit: 2024-03-04 | Discharge: 2024-03-10 | DRG: 872 | Disposition: A | Discharge status: Home Health | Attending: Internal Medicine | Admitting: Internal Medicine

## 2024-03-04 ENCOUNTER — Emergency Department (HOSPITAL_COMMUNITY)

## 2024-03-04 ENCOUNTER — Encounter (HOSPITAL_COMMUNITY): Payer: Self-pay | Admitting: *Deleted

## 2024-03-04 DIAGNOSIS — N400 Enlarged prostate without lower urinary tract symptoms: Secondary | ICD-10-CM | POA: Diagnosis present

## 2024-03-04 DIAGNOSIS — Z8249 Family history of ischemic heart disease and other diseases of the circulatory system: Secondary | ICD-10-CM

## 2024-03-04 DIAGNOSIS — L03116 Cellulitis of left lower limb: Secondary | ICD-10-CM | POA: Diagnosis present

## 2024-03-04 DIAGNOSIS — Z860100 Personal history of colon polyps, unspecified: Secondary | ICD-10-CM

## 2024-03-04 DIAGNOSIS — Z7901 Long term (current) use of anticoagulants: Secondary | ICD-10-CM

## 2024-03-04 DIAGNOSIS — Z87891 Personal history of nicotine dependence: Secondary | ICD-10-CM

## 2024-03-04 DIAGNOSIS — M7989 Other specified soft tissue disorders: Secondary | ICD-10-CM | POA: Diagnosis not present

## 2024-03-04 DIAGNOSIS — I129 Hypertensive chronic kidney disease with stage 1 through stage 4 chronic kidney disease, or unspecified chronic kidney disease: Secondary | ICD-10-CM | POA: Diagnosis present

## 2024-03-04 DIAGNOSIS — M1990 Unspecified osteoarthritis, unspecified site: Secondary | ICD-10-CM | POA: Diagnosis present

## 2024-03-04 DIAGNOSIS — A419 Sepsis, unspecified organism: Secondary | ICD-10-CM | POA: Diagnosis not present

## 2024-03-04 DIAGNOSIS — E872 Acidosis, unspecified: Secondary | ICD-10-CM | POA: Diagnosis present

## 2024-03-04 DIAGNOSIS — R609 Edema, unspecified: Secondary | ICD-10-CM | POA: Diagnosis not present

## 2024-03-04 DIAGNOSIS — K5732 Diverticulitis of large intestine without perforation or abscess without bleeding: Secondary | ICD-10-CM | POA: Diagnosis present

## 2024-03-04 DIAGNOSIS — D631 Anemia in chronic kidney disease: Secondary | ICD-10-CM | POA: Diagnosis present

## 2024-03-04 DIAGNOSIS — D539 Nutritional anemia, unspecified: Secondary | ICD-10-CM | POA: Insufficient documentation

## 2024-03-04 DIAGNOSIS — I4819 Other persistent atrial fibrillation: Secondary | ICD-10-CM | POA: Diagnosis present

## 2024-03-04 DIAGNOSIS — K5792 Diverticulitis of intestine, part unspecified, without perforation or abscess without bleeding: Secondary | ICD-10-CM | POA: Diagnosis not present

## 2024-03-04 DIAGNOSIS — Z8619 Personal history of other infectious and parasitic diseases: Secondary | ICD-10-CM

## 2024-03-04 DIAGNOSIS — I10 Essential (primary) hypertension: Secondary | ICD-10-CM | POA: Diagnosis present

## 2024-03-04 DIAGNOSIS — N1831 Chronic kidney disease, stage 3a: Secondary | ICD-10-CM | POA: Diagnosis present

## 2024-03-04 DIAGNOSIS — I480 Paroxysmal atrial fibrillation: Secondary | ICD-10-CM | POA: Diagnosis not present

## 2024-03-04 DIAGNOSIS — K219 Gastro-esophageal reflux disease without esophagitis: Secondary | ICD-10-CM | POA: Diagnosis present

## 2024-03-04 DIAGNOSIS — Z79899 Other long term (current) drug therapy: Secondary | ICD-10-CM

## 2024-03-04 DIAGNOSIS — Z800 Family history of malignant neoplasm of digestive organs: Secondary | ICD-10-CM

## 2024-03-04 DIAGNOSIS — L0390 Cellulitis, unspecified: Secondary | ICD-10-CM | POA: Diagnosis present

## 2024-03-04 DIAGNOSIS — N183 Chronic kidney disease, stage 3 unspecified: Secondary | ICD-10-CM | POA: Diagnosis present

## 2024-03-04 DIAGNOSIS — I4891 Unspecified atrial fibrillation: Secondary | ICD-10-CM | POA: Diagnosis present

## 2024-03-04 DIAGNOSIS — Z888 Allergy status to other drugs, medicaments and biological substances status: Secondary | ICD-10-CM

## 2024-03-04 DIAGNOSIS — I959 Hypotension, unspecified: Secondary | ICD-10-CM | POA: Diagnosis not present

## 2024-03-04 DIAGNOSIS — Z1152 Encounter for screening for COVID-19: Secondary | ICD-10-CM

## 2024-03-04 DIAGNOSIS — Z8701 Personal history of pneumonia (recurrent): Secondary | ICD-10-CM

## 2024-03-04 DIAGNOSIS — Z85828 Personal history of other malignant neoplasm of skin: Secondary | ICD-10-CM

## 2024-03-04 DIAGNOSIS — E785 Hyperlipidemia, unspecified: Secondary | ICD-10-CM | POA: Diagnosis present

## 2024-03-04 DIAGNOSIS — Z809 Family history of malignant neoplasm, unspecified: Secondary | ICD-10-CM

## 2024-03-04 LAB — CBC WITH DIFFERENTIAL/PLATELET
Abs Immature Granulocytes: 0.22 K/uL — ABNORMAL HIGH (ref 0.00–0.07)
Basophils Absolute: 0 K/uL (ref 0.0–0.1)
Basophils Relative: 0 %
Eosinophils Absolute: 0 K/uL (ref 0.0–0.5)
Eosinophils Relative: 0 %
HCT: 35.8 % — ABNORMAL LOW (ref 39.0–52.0)
Hemoglobin: 11.5 g/dL — ABNORMAL LOW (ref 13.0–17.0)
Immature Granulocytes: 2 %
Lymphocytes Relative: 5 %
Lymphs Abs: 0.7 K/uL (ref 0.7–4.0)
MCH: 34.2 pg — ABNORMAL HIGH (ref 26.0–34.0)
MCHC: 32.1 g/dL (ref 30.0–36.0)
MCV: 106.5 fL — ABNORMAL HIGH (ref 80.0–100.0)
Monocytes Absolute: 1.1 K/uL — ABNORMAL HIGH (ref 0.1–1.0)
Monocytes Relative: 8 %
Neutro Abs: 11.4 K/uL — ABNORMAL HIGH (ref 1.7–7.7)
Neutrophils Relative %: 85 %
Platelets: 186 K/uL (ref 150–400)
RBC: 3.36 MIL/uL — ABNORMAL LOW (ref 4.22–5.81)
RDW: 17.1 % — ABNORMAL HIGH (ref 11.5–15.5)
WBC: 13.5 K/uL — ABNORMAL HIGH (ref 4.0–10.5)
nRBC: 0 % (ref 0.0–0.2)

## 2024-03-04 LAB — URINALYSIS, W/ REFLEX TO CULTURE (INFECTION SUSPECTED)
Bilirubin Urine: NEGATIVE
Glucose, UA: NEGATIVE mg/dL
Hgb urine dipstick: NEGATIVE
Ketones, ur: NEGATIVE mg/dL
Leukocytes,Ua: NEGATIVE
Nitrite: NEGATIVE
Protein, ur: NEGATIVE mg/dL
Specific Gravity, Urine: 1.008 (ref 1.005–1.030)
pH: 6 (ref 5.0–8.0)

## 2024-03-04 LAB — RESP PANEL BY RT-PCR (RSV, FLU A&B, COVID)  RVPGX2
Influenza A by PCR: NEGATIVE
Influenza B by PCR: NEGATIVE
Resp Syncytial Virus by PCR: NEGATIVE
SARS Coronavirus 2 by RT PCR: NEGATIVE

## 2024-03-04 LAB — COMPREHENSIVE METABOLIC PANEL WITH GFR
ALT: 17 U/L (ref 0–44)
AST: 30 U/L (ref 15–41)
Albumin: 3.5 g/dL (ref 3.5–5.0)
Alkaline Phosphatase: 50 U/L (ref 38–126)
Anion gap: 12 (ref 5–15)
BUN: 27 mg/dL — ABNORMAL HIGH (ref 8–23)
CO2: 27 mmol/L (ref 22–32)
Calcium: 9 mg/dL (ref 8.9–10.3)
Chloride: 104 mmol/L (ref 98–111)
Creatinine, Ser: 1.47 mg/dL — ABNORMAL HIGH (ref 0.61–1.24)
GFR, Estimated: 45 mL/min — ABNORMAL LOW (ref >60)
Glucose, Bld: 136 mg/dL — ABNORMAL HIGH (ref 70–99)
Potassium: 4 mmol/L (ref 3.5–5.1)
Sodium: 143 mmol/L (ref 135–145)
Total Bilirubin: 1.5 mg/dL — ABNORMAL HIGH (ref 0.0–1.2)
Total Protein: 5.7 g/dL — ABNORMAL LOW (ref 6.5–8.1)

## 2024-03-04 LAB — I-STAT CG4 LACTIC ACID, ED
Lactic Acid, Venous: 2 mmol/L (ref 0.5–1.9)
Lactic Acid, Venous: 1.8 mmol/L (ref 0.5–1.9)

## 2024-03-04 LAB — PROTIME-INR
INR: 1.2 (ref 0.8–1.2)
Prothrombin Time: 15.4 s — ABNORMAL HIGH (ref 11.4–15.2)

## 2024-03-04 LAB — LIPASE, BLOOD: Lipase: 25 U/L (ref 11–51)

## 2024-03-04 MED ORDER — LACTATED RINGERS IV BOLUS
1000.0000 mL | Freq: Once | INTRAVENOUS | Status: AC
  Administered 2024-03-04: 1000 mL via INTRAVENOUS

## 2024-03-04 MED ORDER — FLUTICASONE FUROATE-VILANTEROL 100-25 MCG/ACT IN AEPB
1.0000 | INHALATION_SPRAY | Freq: Every day | RESPIRATORY_TRACT | Status: DC
  Administered 2024-03-05 – 2024-03-10 (×5): 1 via RESPIRATORY_TRACT
  Filled 2024-03-04: qty 28

## 2024-03-04 MED ORDER — GABAPENTIN 100 MG PO CAPS
100.0000 mg | ORAL_CAPSULE | Freq: Two times a day (BID) | ORAL | Status: DC
  Administered 2024-03-04 – 2024-03-10 (×12): 100 mg via ORAL
  Filled 2024-03-04 (×12): qty 1

## 2024-03-04 MED ORDER — AMLODIPINE BESYLATE 2.5 MG PO TABS
2.5000 mg | ORAL_TABLET | Freq: Every day | ORAL | Status: DC
  Administered 2024-03-05 – 2024-03-10 (×6): 2.5 mg via ORAL
  Filled 2024-03-04 (×6): qty 1

## 2024-03-04 MED ORDER — METOPROLOL SUCCINATE ER 50 MG PO TB24
50.0000 mg | ORAL_TABLET | Freq: Every day | ORAL | Status: DC
  Administered 2024-03-05 – 2024-03-10 (×6): 50 mg via ORAL
  Filled 2024-03-04 (×6): qty 1

## 2024-03-04 MED ORDER — SODIUM CHLORIDE 0.9 % IV SOLN
2.0000 g | INTRAVENOUS | Status: DC
  Administered 2024-03-05 – 2024-03-07 (×3): 2 g via INTRAVENOUS
  Filled 2024-03-04 (×3): qty 20

## 2024-03-04 MED ORDER — SODIUM CHLORIDE 0.9 % IV SOLN
2.0000 g | Freq: Once | INTRAVENOUS | Status: AC
  Administered 2024-03-04: 2 g via INTRAVENOUS
  Filled 2024-03-04: qty 20

## 2024-03-04 MED ORDER — PRAVASTATIN SODIUM 40 MG PO TABS
20.0000 mg | ORAL_TABLET | Freq: Every day | ORAL | Status: DC
  Administered 2024-03-05 – 2024-03-09 (×5): 20 mg via ORAL
  Filled 2024-03-04 (×5): qty 1

## 2024-03-04 MED ORDER — ONDANSETRON HCL 4 MG/2ML IJ SOLN
4.0000 mg | Freq: Once | INTRAMUSCULAR | Status: AC
  Administered 2024-03-04: 4 mg via INTRAVENOUS
  Filled 2024-03-04: qty 2

## 2024-03-04 MED ORDER — METRONIDAZOLE 500 MG/100ML IV SOLN
500.0000 mg | Freq: Two times a day (BID) | INTRAVENOUS | Status: DC
  Administered 2024-03-05: 500 mg via INTRAVENOUS
  Filled 2024-03-04: qty 100

## 2024-03-04 MED ORDER — IOHEXOL 350 MG/ML SOLN
60.0000 mL | Freq: Once | INTRAVENOUS | Status: AC | PRN
  Administered 2024-03-04: 60 mL via INTRAVENOUS

## 2024-03-04 MED ORDER — APIXABAN 2.5 MG PO TABS
2.5000 mg | ORAL_TABLET | Freq: Two times a day (BID) | ORAL | Status: DC
  Administered 2024-03-04 – 2024-03-10 (×12): 2.5 mg via ORAL
  Filled 2024-03-04 (×12): qty 1

## 2024-03-04 MED ORDER — METRONIDAZOLE 500 MG/100ML IV SOLN
500.0000 mg | Freq: Once | INTRAVENOUS | Status: AC
  Administered 2024-03-04: 500 mg via INTRAVENOUS
  Filled 2024-03-04: qty 100

## 2024-03-04 MED ORDER — VITAMIN B-12 1000 MCG PO TABS
500.0000 ug | ORAL_TABLET | Freq: Every day | ORAL | Status: DC
  Administered 2024-03-05 – 2024-03-10 (×6): 500 ug via ORAL
  Filled 2024-03-04 (×7): qty 1

## 2024-03-04 MED ORDER — ACETAMINOPHEN 650 MG RE SUPP: 650.0000 mg | Freq: Four times a day (QID) | RECTAL | Status: DC | PRN

## 2024-03-04 MED ORDER — PANTOPRAZOLE SODIUM 20 MG PO TBEC
20.0000 mg | DELAYED_RELEASE_TABLET | Freq: Every day | ORAL | Status: DC
  Administered 2024-03-05 – 2024-03-10 (×6): 20 mg via ORAL
  Filled 2024-03-04 (×6): qty 1

## 2024-03-04 MED ORDER — ACETAMINOPHEN 325 MG PO TABS
650.0000 mg | ORAL_TABLET | Freq: Four times a day (QID) | ORAL | Status: DC | PRN
  Administered 2024-03-05: 650 mg via ORAL
  Filled 2024-03-04: qty 2

## 2024-03-04 MED ORDER — FINASTERIDE 5 MG PO TABS
5.0000 mg | ORAL_TABLET | Freq: Every day | ORAL | Status: DC
  Administered 2024-03-05 – 2024-03-10 (×6): 5 mg via ORAL
  Filled 2024-03-04 (×6): qty 1

## 2024-03-04 MED ORDER — TAMSULOSIN HCL 0.4 MG PO CAPS
0.4000 mg | ORAL_CAPSULE | Freq: Every day | ORAL | Status: DC
  Administered 2024-03-05: 0.4 mg via ORAL
  Filled 2024-03-04: qty 1

## 2024-03-04 MED ORDER — LACTATED RINGERS IV SOLN: INTRAVENOUS | Status: AC

## 2024-03-04 NOTE — H&P (Incomplete)
 History and Physical    [name] FMW:[id number] DOB: [birth year] DOA: 03/04/2024  Patient coming from: ***  Chief Complaint: ***  HPI: [name] is a 89 y.o. male with ***   ED Course: ***  Review of Systems: As per HPI, rest all negative.   Past Medical History:  Diagnosis Date   Actinic keratosis 03/13/2009   Arthritis    BENIGN PROSTATIC HYPERTROPHY, HX OF 04/07/2009   CARCINOMA, SKIN, SQUAMOUS CELL 09/11/2009   COLONIC POLYPS, HX OF 03/13/2009   Essential hypertension    GERD 03/13/2009   Hyperlipidemia    INSOMNIA, CHRONIC 09/11/2009   Persistent atrial fibrillation (HCC) 04/07/2009   a. s/p afib ablation at [facility] x 2 in 2010.   POLYPECTOMY, HX OF 04/07/2009   Premature atrial contractions    PULMONARY NODULE 09/11/2009   PVC's (premature ventricular contractions)    Rosacea 03/13/2009   SKIN CANCER, HX OF 03/13/2009    Past Surgical History:  Procedure Laterality Date   ABLATION  2010   x2   COLONOSCOPY W/ BIOPSIES AND POLYPECTOMY     EYE SURGERY Bilateral    cataracts   LUMBAR DISC SURGERY     RUPTURE   LUMBAR LAMINECTOMY/DECOMPRESSION MICRODISCECTOMY N/A 04/20/2014   Procedure: LUMBAR TWO-THREE, LUMBAR THREE-FOUR, LUMAR FOUR-FIVE LAMINECTOMIES;  Surgeon: Reyes Budge, MD;  Location: [facility]URO ORS;  Service: Neurosurgery;  Laterality: N/A;   POLYPECTOMY     SKIN CANCER EXCISION     TEE WITHOUT CARDIOVERSION N/A 06/01/2023   Procedure: TRANSESOPHAGEAL ECHOCARDIOGRAM;  Surgeon: Okey Vina GAILS, MD;  Location: [facility] INVASIVE CV LAB;  Service: Cardiovascular;  Laterality: N/A;     reports that he quit smoking about 36 years ago. His smoking use included cigarettes. He started smoking about 66 years ago. He has a 30 pack-year smoking history. He has quit using smokeless tobacco.  His smokeless tobacco use included chew. He reports that he does not drink alcohol and does not use drugs.  Allergies  Allergen Reactions   Losartan  Nausea And Vomiting and Other (See  Comments)    Elevated creatinine levels     Family History  Problem Relation Age of Onset   Cancer Mother    Cancer Father 70       COLON   Heart attack Father    Heart disease Father    Heart disease Sister    CAD Sister    Heart disease Brother    CAD Brother    CAD Brother    Heart disease Brother     Prior to Admission medications   Medication Sig Start Date End Date Taking? Authorizing Provider  acetaminophen  (TYLENOL ) 500 MG tablet Take 500 mg by mouth every 6 (six) hours as needed for mild pain.   Yes [provider]  amLODipine  (NORVASC ) 2.5 MG tablet TAKE 1 TABLET DAILY 02/24/24  Yes Burchette, Wolm LELON, MD  apixaban  (ELIQUIS ) 2.5 MG TABS tablet Take 1 tablet (2.5 mg total) by mouth 2 (two) times daily. 02/11/24  Yes Turner, Wilbert SAUNDERS, MD  finasteride  (PROSCAR ) 5 MG tablet TAKE 1 TABLET (5 MG TOTAL) BY MOUTH DAILY. 02/24/24  Yes Burchette, Wolm LELON, MD  furosemide  (LASIX ) 40 MG tablet TAKE 1 TABLET EVERY DAY AS NEEDED FOR LEG SWELLING Patient taking differently: Take 40 mg by mouth 2 (two) times daily. 01/13/24  Yes Burchette, Wolm LELON, MD  gabapentin  (NEURONTIN ) 100 MG capsule Take 100 mg by mouth 2 (two) times daily. 02/08/24  Yes [provider]  glucosamine-chondroitin 500-400 MG tablet Take 2 tablets by mouth daily.   Yes [provider]  lansoprazole  (PREVACID ) 30 MG capsule TAKE 1 CAPSULE EVERY DAY AT 12 NOON. Patient taking differently: Take 30 mg by mouth daily at 12 noon. 01/19/24  Yes Turner, Wilbert SAUNDERS, MD  lovastatin  (MEVACOR ) 20 MG tablet TAKE 1 TABLET BY MOUTH EVERYDAY AT BEDTIME Patient taking differently: Take 20 mg by mouth at bedtime. 12/25/23  Yes Burchette, Wolm ORN, MD  metoprolol  succinate (TOPROL -XL) 50 MG 24 hr tablet TAKE 1 TABLET BY MOUTH EVERY DAY WITH OR IMMEDIATELY FOLLOWING A MEAL Patient taking differently: Take 50 mg by mouth daily. 02/11/24  Yes Turner, Wilbert SAUNDERS, MD  potassium chloride  (KLOR-CON ) 10 MEQ tablet TAKE 2 TABLETS BY  MOUTH EVERY DAY 09/28/23  Yes Burchette, Wolm ORN, MD  tamsulosin  (FLOMAX ) 0.4 MG CAPS capsule Take 1 capsule (0.4 mg total) by mouth daily. Patient taking differently: Take 0.4 mg by mouth in the morning and at bedtime. 09/06/21  Yes Burchette, Wolm ORN, MD  vitamin B-12 (CYANOCOBALAMIN ) 500 MCG tablet Take 500 mcg by mouth daily.   Yes [provider]  VITAMIN D PO Take 400 Units by mouth daily.    Yes [provider]  budesonide -formoterol  (BREYNA ) 80-4.5 MCG/ACT inhaler Inhale 2 puffs into the lungs in the morning and at bedtime. 07/03/23   Burchette, Wolm ORN, MD    Physical Exam: Constitutional: *** Vitals:   03/04/24 1915 03/04/24 1930 03/04/24 1945 03/04/24 2000  BP: (!) 145/57 130/72 138/63 136/68  Pulse: 92 95 91 90  Resp: 16 19 (!) 23 19  Temp:      TempSrc:      SpO2: 96% 94% 96% 98%  Weight:      Height:       Eyes: *** ENMT: *** Neck: *** Respiratory: *** Cardiovascular: *** Abdomen: *** Musculoskeletal: *** Skin: *** Neurologic:*** Psychiatric: ***   Labs on Admission: I have personally reviewed following labs and imaging studies  CBC: Recent Labs  Lab 03/04/24 1046  WBC 13.5*  NEUTROABS 11.4*  HGB 11.5*  HCT 35.8*  MCV 106.5*  PLT 186   Basic Metabolic Panel: Recent Labs  Lab 03/04/24 1046  NA 143  K 4.0  CL 104  CO2 27  GLUCOSE 136*  BUN 27*  CREATININE 1.47*  CALCIUM 9.0   GFR: Estimated Creatinine Clearance: 37.7 mL/min (A) (by C-G formula based on SCr of 1.47 mg/dL (H)). Liver Function Tests: Recent Labs  Lab 03/04/24 1046  AST 30  ALT 17  ALKPHOS 50  BILITOT 1.5*  PROT 5.7*  ALBUMIN 3.5   Recent Labs  Lab 03/04/24 1046  LIPASE 25   No results for input(s): AMMONIA in the last 168 hours. Coagulation Profile: Recent Labs  Lab 03/04/24 1046  INR 1.2   Cardiac Enzymes: No results for input(s): CKTOTAL, CKMB, CKMBINDEX, TROPONINI in the last 168 hours. BNP (last 3 results) No results for  input(s): PROBNP in the last 8760 hours. HbA1C: No results for input(s): HGBA1C in the last 72 hours. CBG: No results for input(s): GLUCAP in the last 168 hours. Lipid Profile: No results for input(s): CHOL, HDL, LDLCALC, TRIG, CHOLHDL, LDLDIRECT in the last 72 hours. Thyroid  Function Tests: No results for input(s): TSH, T4TOTAL, FREET4, T3FREE, THYROIDAB in the last 72 hours. Anemia Panel: No results for input(s): VITAMINB12, FOLATE, FERRITIN, TIBC, IRON, RETICCTPCT in the last 72 hours. Urine analysis:    Component Value Date/Time   COLORURINE  STRAW (A) 03/04/2024 1046   APPEARANCEUR CLEAR 03/04/2024 1046   LABSPEC 1.008 03/04/2024 1046   PHURINE 6.0 03/04/2024 1046   GLUCOSEU NEGATIVE 03/04/2024 1046   HGBUR NEGATIVE 03/04/2024 1046   BILIRUBINUR NEGATIVE 03/04/2024 1046   BILIRUBINUR Negative 04/29/2023 1342   KETONESUR NEGATIVE 03/04/2024 1046   PROTEINUR NEGATIVE 03/04/2024 1046   UROBILINOGEN 0.2 04/29/2023 1342   UROBILINOGEN 1 02/23/2014 1404   NITRITE NEGATIVE 03/04/2024 1046   LEUKOCYTESUR NEGATIVE 03/04/2024 1046   Sepsis Labs: @LABRCNTIP (procalcitonin:4,lacticidven:4) ) Recent Results (from the past 240 hours)  Resp panel by RT-PCR (RSV, Flu A&B, Covid) Anterior Nasal Swab     Status: None   Collection Time: 03/04/24  3:23 PM   Specimen: Anterior Nasal Swab  Result Value Ref Range Status   SARS Coronavirus 2 by RT PCR NEGATIVE NEGATIVE Final   Influenza A by PCR NEGATIVE NEGATIVE Final   Influenza B by PCR NEGATIVE NEGATIVE Final    Comment: (NOTE) The Xpert Xpress SARS-CoV-2/FLU/RSV plus assay is intended as an aid in the diagnosis of influenza from Nasopharyngeal swab specimens and should not be used as a sole basis for treatment. Nasal washings and aspirates are unacceptable for Xpert Xpress SARS-CoV-2/FLU/RSV testing.  Fact Sheet for Patients: BloggerCourse.com  Fact Sheet for Healthcare  Providers: SeriousBroker.it  This test is not yet approved or cleared by the [place]  FDA and has been authorized for detection and/or diagnosis of SARS-CoV-2 by FDA under an Emergency Use Authorization (EUA). This EUA will remain in effect (meaning this test can be used) for the duration of the COVID-19 declaration under Section 564(b)(1) of the Act, 21 U.S.C. section 360bbb-3(b)(1), unless the authorization is terminated or revoked.     Resp Syncytial Virus by PCR NEGATIVE NEGATIVE Final    Comment: (NOTE) Fact Sheet for Patients: BloggerCourse.com  Fact Sheet for Healthcare Providers: SeriousBroker.it  This test is not yet approved or cleared by the [place]  FDA and has been authorized for detection and/or diagnosis of SARS-CoV-2 by FDA under an Emergency Use Authorization (EUA). This EUA will remain in effect (meaning this test can be used) for the duration of the COVID-19 declaration under Section 564(b)(1) of the Act, 21 U.S.C. section 360bbb-3(b)(1), unless the authorization is terminated or revoked.  Performed at [facility], [address] [place], [place] [address]      Radiological Exams on Admission: CT ABDOMEN PELVIS W CONTRAST Result Date: 03/04/2024 CLINICAL DATA:  Acute nonlocalized abdominal pain, dysuria, fever EXAM: CT ABDOMEN AND PELVIS WITH CONTRAST TECHNIQUE: Multidetector CT imaging of the abdomen and pelvis was performed using the standard protocol following bolus administration of intravenous contrast. RADIATION DOSE REDUCTION: This exam was performed according to the departmental dose-optimization program which includes automated exposure control, adjustment of the mA and/or kV according to patient size and/or use of iterative reconstruction technique. CONTRAST:  60mL OMNIPAQUE  IOHEXOL  350 MG/ML SOLN COMPARISON:  None Available. FINDINGS: Lower chest: Lingular  atelectasis/scarring. Pleural calcifications in the left lower lung. No acute abnormality. Hepatobiliary: No acute abnormality. Pancreas: Unremarkable. Spleen: Unremarkable. Adrenals/Urinary Tract: Unremarkable adrenal glands. No urinary calculi or hydronephrosis. Multiple small bladder diverticula. Stomach/Bowel: Mild wall thickening and stranding about the mid descending colon. Scattered colonic diverticulosis. No bowel obstruction. Stomach is within normal limits normal appendix. Vascular/Lymphatic: Aortic atherosclerosis. No enlarged abdominal or pelvic lymph nodes. Reproductive: Unremarkable. Other: No free intraperitoneal fluid or air.  No abscess. Musculoskeletal: No acute fracture. IMPRESSION: 1. Mild wall thickening and stranding about the mid  descending colon, likely mild diverticulitis. No abscess or free air. 2. Aortic Atherosclerosis (ICD10-I70.0). Electronically Signed   By: Norman Gatlin M.D.   On: 03/04/2024 18:19   VAS US  LOWER EXTREMITY VENOUS (DVT) (7a-7p) Result Date: 03/04/2024  Lower Venous DVT Study Patient Name:  AMARRI SATTERLY  Date of Exam:   03/04/2024 Medical Rec #: [id number]         Accession #:    [id number] Date of Birth: [birth year]         Patient Gender: M Patient Age:   49 years Exam Location:  [facility] Procedure:      VAS US  LOWER EXTREMITY VENOUS (DVT) Referring Phys: JAYSON PEREYRA --------------------------------------------------------------------------------  Indications: Pain, Swelling, Erythema, and Fever, cellulitis.  Limitations: Pain with minimal touch, patient kicking and twitching leg throughout entire study. Comparison Study: No prior study on file Performing Technologist: Alberta Lis RVS  Examination Guidelines: A complete evaluation includes B-mode imaging, spectral Doppler, color Doppler, and power Doppler as needed of all accessible portions of each vessel. Bilateral testing is considered an integral part of a complete examination. Limited  examinations for reoccurring indications may be performed as noted. The reflux portion of the exam is performed with the patient in reverse Trendelenburg.  +-----+---------------+---------+-----------+----------+--------------+ RIGHTCompressibilityPhasicitySpontaneityPropertiesThrombus Aging +-----+---------------+---------+-----------+----------+--------------+ CFV  Full           Yes      Yes                                 +-----+---------------+---------+-----------+----------+--------------+ SFJ  Full                                                        +-----+---------------+---------+-----------+----------+--------------+   +---------+---------------+---------+-----------+----------+-------------------+ LEFT     CompressibilityPhasicitySpontaneityPropertiesThrombus Aging      +---------+---------------+---------+-----------+----------+-------------------+ CFV      Full           Yes      Yes                                      +---------+---------------+---------+-----------+----------+-------------------+ SFJ      Full                                                             +---------+---------------+---------+-----------+----------+-------------------+ FV Prox  Full           Yes      Yes                                      +---------+---------------+---------+-----------+----------+-------------------+ FV Mid                  Yes      Yes                  patent by color and  Doppler             +---------+---------------+---------+-----------+----------+-------------------+ FV Distal               Yes      Yes                  patent by color and                                                       Doppler             +---------+---------------+---------+-----------+----------+-------------------+ PFV      Full                                                              +---------+---------------+---------+-----------+----------+-------------------+ POP                                                   patent by color and                                                       Doppler             +---------+---------------+---------+-----------+----------+-------------------+ PTV                                                   Not well visualized +---------+---------------+---------+-----------+----------+-------------------+ PERO                                                  Not well visualized +---------+---------------+---------+-----------+----------+-------------------+     Summary: RIGHT: - No evidence of common femoral vein obstruction.   LEFT: - There is no evidence of deep vein thrombosis in the lower extremity. However, portions of this examination were limited- see technologist comments above.  - No cystic structure found in the popliteal fossa.  *See table(s) above for measurements and observations. Electronically signed by Gaile New MD on 03/04/2024 at 5:18:42 PM.    Final    DG Chest Port 1 View Result Date: 03/04/2024 CLINICAL DATA:  Questionable sepsis - evaluate for abnormality EXAM: PORTABLE CHEST - 1 VIEW COMPARISON:  None available. FINDINGS: No focal airspace consolidation, pleural effusion, or pneumothorax. No cardiomegaly.No acute fracture or destructive lesion. IMPRESSION: No acute cardiopulmonary abnormality. Electronically Signed   By: Rogelia Myers M.D.   On: 03/04/2024 11:15    EKG: Independently reviewed. ***  Assessment/Plan Principal Problem:   Diverticulitis Active Problems:   Essential hypertension   ATRIAL FIBRILLATION   CKD (chronic kidney disease) stage 3, GFR 30-59  ml/min (HCC)   Hyperlipidemia   BPH (benign prostatic hyperplasia)    ***   DVT prophylaxis: *** Code Status: ***  Family Communication: ***  Disposition Plan: ***  Consults called: ***  Admission status: ***

## 2024-03-04 NOTE — ED Notes (Signed)
 Help get patient on the monitor got patient vitals got patient into a gown help patient with the urinal patient is resting with call bell in reach

## 2024-03-04 NOTE — ED Notes (Signed)
 Assessment for PIV completed. No visual or palpable veins observed. IV Team consult placed for US  Guided

## 2024-03-04 NOTE — ED Provider Notes (Signed)
 [facility] Provider Note  CSN: [id number] Arrival date & time: 03/04/24 1029  Chief Complaint(s) Fever  HPI [name] is a 89 y.o. male with past medical history as below, significant for hld, afib s/p ablation, PVC's who presents to the ED with complaint of fever, left leg pain and redness, nausea and vomiting x 1, dysuria  Patient here with wife, has been having worsening leg swelling for the past few weeks, they have increased his Lasix  without much improvement.  Left leg more swollen than the right leg.  Also has been having redness to his left leg over the past 24 hours, difficulty ambulating secondary to the left leg pain.  Is also been having increased urgency and dysuria over the past 24 hours, nausea and vomiting, generalized abdominal discomfort, fever at home of 102.  Denies chest pain or dyspnea, no recent travel or sick contacts, denies history of VTE  Past Medical History Past Medical History:  Diagnosis Date   Actinic keratosis 03/13/2009   Arthritis    BENIGN PROSTATIC HYPERTROPHY, HX OF 04/07/2009   CARCINOMA, SKIN, SQUAMOUS CELL 09/11/2009   COLONIC POLYPS, HX OF 03/13/2009   Essential hypertension    GERD 03/13/2009   Hyperlipidemia    INSOMNIA, CHRONIC 09/11/2009   Persistent atrial fibrillation (HCC) 04/07/2009   a. s/p afib ablation at [facility] x 2 in 2010.   POLYPECTOMY, HX OF 04/07/2009   Premature atrial contractions    PULMONARY NODULE 09/11/2009   PVC's (premature ventricular contractions)    Rosacea 03/13/2009   SKIN CANCER, HX OF 03/13/2009   Patient Active Problem List   Diagnosis Date Noted   Cellulitis of lower extremity 06/01/2023   Fever 05/27/2023   Community acquired pneumonia 05/27/2023   CAP (community acquired pneumonia) 05/27/2023   MSSA bacteremia 05/27/2023   Sepsis due to pneumonia (HCC) 05/26/2023   Acute encephalopathy 05/26/2023   Ventricular bigeminy 04/08/2019   Unstable angina (HCC)  04/07/2019   Right sciatic nerve pain 07/18/2015   Recurrent vertigo 07/18/2015   BPH (benign prostatic hyperplasia) 06/22/2015   CKD (chronic kidney disease) stage 3, GFR 30-59 ml/min (HCC) 12/22/2014   Lumbar stenosis with neurogenic claudication 04/20/2014   Spinal stenosis of lumbar region 12/15/2013   Need for shingles vaccine 08/31/2012   Prediabetes 10/16/2011   CARCINOMA, SKIN, SQUAMOUS CELL 09/11/2009   INSOMNIA, CHRONIC 09/11/2009   PULMONARY NODULE 09/11/2009   ATRIAL FIBRILLATION 04/07/2009   BENIGN PROSTATIC HYPERTROPHY, HX OF 04/07/2009   Personal history presenting hazards to health 04/07/2009   Hyperlipidemia 03/13/2009   Essential hypertension 03/13/2009   GERD 03/13/2009   ROSACEA 03/13/2009   ACTINIC KERATOSIS 03/13/2009   SKIN CANCER, HX OF 03/13/2009   History of colonic polyps 03/13/2009   Home Medication(s) Prior to Admission medications   Medication Sig Start Date End Date Taking? Authorizing Provider  acetaminophen  (TYLENOL ) 500 MG tablet Take 500 mg by mouth every 6 (six) hours as needed for mild pain.   Yes [provider]  amLODipine  (NORVASC ) 2.5 MG tablet TAKE 1 TABLET DAILY 02/24/24  Yes Burchette, Wolm LELON, MD  apixaban  (ELIQUIS ) 2.5 MG TABS tablet Take 1 tablet (2.5 mg total) by mouth 2 (two) times daily. 02/11/24  Yes Turner, Wilbert SAUNDERS, MD  finasteride  (PROSCAR ) 5 MG tablet TAKE 1 TABLET (5 MG TOTAL) BY MOUTH DAILY. 02/24/24  Yes Burchette, Wolm LELON, MD  furosemide  (LASIX ) 40 MG tablet TAKE 1 TABLET EVERY DAY AS NEEDED FOR LEG  SWELLING Patient taking differently: Take 40 mg by mouth 2 (two) times daily. 01/13/24  Yes Burchette, Wolm ORN, MD  gabapentin  (NEURONTIN ) 100 MG capsule Take 100 mg by mouth 2 (two) times daily. 02/08/24  Yes [provider]  glucosamine-chondroitin 500-400 MG tablet Take 2 tablets by mouth daily.   Yes [provider]  lansoprazole  (PREVACID ) 30 MG capsule TAKE 1 CAPSULE EVERY DAY AT 12 NOON. Patient taking  differently: Take 30 mg by mouth daily at 12 noon. 01/19/24  Yes Turner, Wilbert SAUNDERS, MD  lovastatin  (MEVACOR ) 20 MG tablet TAKE 1 TABLET BY MOUTH EVERYDAY AT BEDTIME Patient taking differently: Take 20 mg by mouth at bedtime. 12/25/23  Yes Burchette, Wolm ORN, MD  metoprolol  succinate (TOPROL -XL) 50 MG 24 hr tablet TAKE 1 TABLET BY MOUTH EVERY DAY WITH OR IMMEDIATELY FOLLOWING A MEAL Patient taking differently: Take 50 mg by mouth daily. 02/11/24  Yes Turner, Wilbert SAUNDERS, MD  potassium chloride  (KLOR-CON ) 10 MEQ tablet TAKE 2 TABLETS BY MOUTH EVERY DAY 09/28/23  Yes Burchette, Wolm ORN, MD  tamsulosin  (FLOMAX ) 0.4 MG CAPS capsule Take 1 capsule (0.4 mg total) by mouth daily. Patient taking differently: Take 0.4 mg by mouth in the morning and at bedtime. 09/06/21  Yes Burchette, Wolm ORN, MD  vitamin B-12 (CYANOCOBALAMIN ) 500 MCG tablet Take 500 mcg by mouth daily.   Yes [provider]  VITAMIN D PO Take 400 Units by mouth daily.    Yes [provider]  budesonide -formoterol  (BREYNA ) 80-4.5 MCG/ACT inhaler Inhale 2 puffs into the lungs in the morning and at bedtime. 07/03/23   Burchette, Wolm ORN, MD                                                                                                                                    Past Surgical History Past Surgical History:  Procedure Laterality Date   ABLATION  2010   x2   COLONOSCOPY W/ BIOPSIES AND POLYPECTOMY     EYE SURGERY Bilateral    cataracts   LUMBAR DISC SURGERY     RUPTURE   LUMBAR LAMINECTOMY/DECOMPRESSION MICRODISCECTOMY N/A 04/20/2014   Procedure: LUMBAR TWO-THREE, LUMBAR THREE-FOUR, LUMAR FOUR-FIVE LAMINECTOMIES;  Surgeon: Reyes Budge, MD;  Location: [facility];  Service: Neurosurgery;  Laterality: N/A;   POLYPECTOMY     SKIN CANCER EXCISION     TEE WITHOUT CARDIOVERSION N/A 06/01/2023   Procedure: TRANSESOPHAGEAL ECHOCARDIOGRAM;  Surgeon: Okey Vina GAILS, MD;  Location: [facility] INVASIVE CV LAB;  Service: Cardiovascular;   Laterality: N/A;   Family History Family History  Problem Relation Age of Onset   Cancer Mother    Cancer Father 7       COLON   Heart attack Father    Heart disease Father    Heart disease Sister    CAD Sister    Heart disease Brother    CAD Brother    CAD Brother  Heart disease Brother     Social History Social History   Tobacco Use   Smoking status: Former    Current packs/day: 0.00    Average packs/day: 1 pack/day for 30.0 years (30.0 ttl pk-yrs)    Types: Cigarettes    Start date: 03/13/1958    Quit date: 03/13/1988    Years since quitting: 36.0   Smokeless tobacco: Former    Types: Chew  Substance Use Topics   Alcohol use: No   Drug use: No   Allergies Losartan   Review of Systems A thorough review of systems was obtained and all systems are negative except as noted in the HPI and PMH.   Physical Exam Vital Signs  I have reviewed the triage vital signs BP 133/61   Pulse (!) 113   Temp 99.8 F (37.7 C) (Oral)   Resp (!) 21   Ht 6' 1 (1.854 m)   Wt 83.5 kg   SpO2 96%   BMI 24.28 kg/m  Physical Exam Vitals and nursing note reviewed.  Constitutional:      General: He is not in acute distress.    Appearance: He is well-developed.  HENT:     Head: Normocephalic and atraumatic.     Right Ear: External ear normal.     Left Ear: External ear normal.     Mouth/Throat:     Mouth: Mucous membranes are moist.  Eyes:     General: No scleral icterus. Cardiovascular:     Rate and Rhythm: Regular rhythm. Tachycardia present.     Pulses: Normal pulses.     Heart sounds: Normal heart sounds.  Pulmonary:     Effort: Pulmonary effort is normal. No respiratory distress.     Breath sounds: Normal breath sounds.  Abdominal:     General: Abdomen is flat.     Palpations: Abdomen is soft.     Tenderness: There is generalized abdominal tenderness.  Musculoskeletal:     Cervical back: No rigidity.     Right lower leg: Edema present.     Left lower leg: Edema  present.  Skin:    General: Skin is warm and dry.     Capillary Refill: Capillary refill takes less than 2 seconds.      Neurological:     Mental Status: He is alert.  Psychiatric:        Mood and Affect: Mood normal.        Behavior: Behavior normal.        ED Results and Treatments Labs (all labs ordered are listed, but only abnormal results are displayed) Labs Reviewed  COMPREHENSIVE METABOLIC PANEL WITH GFR - Abnormal; Notable for the following components:      Result Value   Glucose, Bld 136 (*)    BUN 27 (*)    Creatinine, Ser 1.47 (*)    Total Protein 5.7 (*)    Total Bilirubin 1.5 (*)    GFR, Estimated 45 (*)    All other components within normal limits  CBC WITH DIFFERENTIAL/PLATELET - Abnormal; Notable for the following components:   WBC 13.5 (*)    RBC 3.36 (*)    Hemoglobin 11.5 (*)    HCT 35.8 (*)    MCV 106.5 (*)    MCH 34.2 (*)    RDW 17.1 (*)    Neutro Abs 11.4 (*)    Monocytes Absolute 1.1 (*)    Abs Immature Granulocytes 0.22 (*)    All other components within normal limits  PROTIME-INR - Abnormal; Notable for the following components:   Prothrombin Time 15.4 (*)    All other components within normal limits  URINALYSIS, W/ REFLEX TO CULTURE (INFECTION SUSPECTED) - Abnormal; Notable for the following components:   Color, Urine STRAW (*)    Bacteria, UA RARE (*)    All other components within normal limits  I-STAT CG4 LACTIC ACID, ED - Abnormal; Notable for the following components:   Lactic Acid, Venous 2.0 (*)    All other components within normal limits  CULTURE, BLOOD (ROUTINE X 2)  CULTURE, BLOOD (ROUTINE X 2)  RESP PANEL BY RT-PCR (RSV, FLU A&B, COVID)  RVPGX2  LIPASE, BLOOD  I-STAT CG4 LACTIC ACID, ED                                                                                                                          Radiology VAS US  LOWER EXTREMITY VENOUS (DVT) (7a-7p) Result Date: 03/04/2024  Lower Venous DVT Study Patient Name:   [name]  Date of Exam:   03/04/2024 Medical Rec #: [id number]         Accession #:    [id number] Date of Birth: 1932/04/08         Patient Gender: M Patient Age:   19 years Exam Location:  [facility] Procedure:      VAS US  LOWER EXTREMITY VENOUS (DVT) Referring Phys: JAYSON PEREYRA --------------------------------------------------------------------------------  Indications: Pain, Swelling, Erythema, and Fever, cellulitis.  Limitations: Pain with minimal touch, patient kicking and twitching leg throughout entire study. Comparison Study: No prior study on file Performing Technologist: Alberta Lis RVS  Examination Guidelines: A complete evaluation includes B-mode imaging, spectral Doppler, color Doppler, and power Doppler as needed of all accessible portions of each vessel. Bilateral testing is considered an integral part of a complete examination. Limited examinations for reoccurring indications may be performed as noted. The reflux portion of the exam is performed with the patient in reverse Trendelenburg.  +-----+---------------+---------+-----------+----------+--------------+ RIGHTCompressibilityPhasicitySpontaneityPropertiesThrombus Aging +-----+---------------+---------+-----------+----------+--------------+ CFV  Full           Yes      Yes                                 +-----+---------------+---------+-----------+----------+--------------+ SFJ  Full                                                        +-----+---------------+---------+-----------+----------+--------------+   +---------+---------------+---------+-----------+----------+-------------------+ LEFT     CompressibilityPhasicitySpontaneityPropertiesThrombus Aging      +---------+---------------+---------+-----------+----------+-------------------+ CFV      Full           Yes      Yes                                      +---------+---------------+---------+-----------+----------+-------------------+  SFJ      Full                                                             +---------+---------------+---------+-----------+----------+-------------------+ FV Prox  Full           Yes      Yes                                      +---------+---------------+---------+-----------+----------+-------------------+ FV Mid                  Yes      Yes                  patent by color and                                                       Doppler             +---------+---------------+---------+-----------+----------+-------------------+ FV Distal               Yes      Yes                  patent by color and                                                       Doppler             +---------+---------------+---------+-----------+----------+-------------------+ PFV      Full                                                             +---------+---------------+---------+-----------+----------+-------------------+ POP                                                   patent by color and                                                       Doppler             +---------+---------------+---------+-----------+----------+-------------------+ PTV                                                   Not well visualized +---------+---------------+---------+-----------+----------+-------------------+  PERO                                                  Not well visualized +---------+---------------+---------+-----------+----------+-------------------+    Summary: RIGHT: - No evidence of common femoral vein obstruction.   LEFT: - There is no evidence of deep vein thrombosis in the lower extremity. However, portions of this examination were limited- see technologist comments above.  - No cystic structure found in the popliteal fossa.  *See table(s) above for measurements and observations.    Preliminary    DG Chest Port 1 View Result Date:  03/04/2024 CLINICAL DATA:  Questionable sepsis - evaluate for abnormality EXAM: PORTABLE CHEST - 1 VIEW COMPARISON:  None available. FINDINGS: No focal airspace consolidation, pleural effusion, or pneumothorax. No cardiomegaly.No acute fracture or destructive lesion. IMPRESSION: No acute cardiopulmonary abnormality. Electronically Signed   By: Rogelia Myers M.D.   On: 03/04/2024 11:15    Pertinent labs & imaging results that were available during my care of the patient were reviewed by me and considered in my medical decision making (see MDM for details).  Medications Ordered in ED Medications  lactated ringers  infusion ( Intravenous New Bag/Given 03/04/24 1537)  ondansetron  (ZOFRAN ) injection 4 mg (4 mg Intravenous Given 03/04/24 1256)  lactated ringers  bolus 1,000 mL (0 mLs Intravenous Stopped 03/04/24 1447)  cefTRIAXone  (ROCEPHIN ) 2 g in sodium chloride  0.9 % 100 mL IVPB (2 g Intravenous New Bag/Given 03/04/24 1539)                                                                                                                                     Procedures .Critical Care  Performed by: Elnor Jayson LABOR, DO Authorized by: Elnor Jayson LABOR, DO   Critical care provider statement:    Critical care time (minutes):  30   Critical care time was exclusive of:  Separately billable procedures and treating other patients   Critical care was necessary to treat or prevent imminent or life-threatening deterioration of the following conditions:  Sepsis   Critical care was time spent personally by me on the following activities:  Development of treatment plan with patient or surrogate, discussions with consultants, evaluation of patient's response to treatment, examination of patient, ordering and review of laboratory studies, ordering and review of radiographic studies, ordering and performing treatments and interventions, pulse oximetry, re-evaluation of patient's condition, review of old charts and obtaining  history from patient or surrogate   (including critical care time)  Medical Decision Making / ED Course    Medical Decision Making:    [name] is a 89 y.o. male with past medical history as below, significant for hld, afib s/p ablation, PVC's who presents to the ED with complaint of fever, left leg pain and  redness, nausea and vomiting x 1, dysuria. The complaint involves an extensive differential diagnosis and also carries with it a high risk of complications and morbidity.  Serious etiology was considered. Ddx includes but is not limited to: Differential diagnosis for adult fever includes but is not exclusive to community-acquired pneumonia, urinary tract infection, acute cholecystitis, viral syndrome, cellulitis, tick bourne disease,  decubitus ulcer, necrotizing fasciitis, meningitis, encephalitis, influenza, etc.   Complete initial physical exam performed, notably the patient was in tachycardia, low grade temp Reviewed and confirmed nursing documentation for past medical history, family history, social history.  Vital signs reviewed.    Sepsis > - WBC 13.5, tachycardia, low grade temp > cellulitis to LLE - cellulitis to LLE, no VTE but limited exam > start abx w/ rocephin , send cultures  - hemodynamically stable, does not appear to be septic shock  CT A/P is pending at time of shift change, anticipate admission for sepsis likely 2/2 LE cellulitis                     Additional history obtained: -Additional history obtained from family -External records from outside source obtained and reviewed including: Chart review including previous notes, labs, imaging, consultation notes including  Primary care documentation    Lab Tests: -I ordered, reviewed, and interpreted labs.   The pertinent results include:   Labs Reviewed  COMPREHENSIVE METABOLIC PANEL WITH GFR - Abnormal; Notable for the following components:      Result Value   Glucose, Bld 136 (*)     BUN 27 (*)    Creatinine, Ser 1.47 (*)    Total Protein 5.7 (*)    Total Bilirubin 1.5 (*)    GFR, Estimated 45 (*)    All other components within normal limits  CBC WITH DIFFERENTIAL/PLATELET - Abnormal; Notable for the following components:   WBC 13.5 (*)    RBC 3.36 (*)    Hemoglobin 11.5 (*)    HCT 35.8 (*)    MCV 106.5 (*)    MCH 34.2 (*)    RDW 17.1 (*)    Neutro Abs 11.4 (*)    Monocytes Absolute 1.1 (*)    Abs Immature Granulocytes 0.22 (*)    All other components within normal limits  PROTIME-INR - Abnormal; Notable for the following components:   Prothrombin Time 15.4 (*)    All other components within normal limits  URINALYSIS, W/ REFLEX TO CULTURE (INFECTION SUSPECTED) - Abnormal; Notable for the following components:   Color, Urine STRAW (*)    Bacteria, UA RARE (*)    All other components within normal limits  I-STAT CG4 LACTIC ACID, ED - Abnormal; Notable for the following components:   Lactic Acid, Venous 2.0 (*)    All other components within normal limits  CULTURE, BLOOD (ROUTINE X 2)  CULTURE, BLOOD (ROUTINE X 2)  RESP PANEL BY RT-PCR (RSV, FLU A&B, COVID)  RVPGX2  LIPASE, BLOOD  I-STAT CG4 LACTIC ACID, ED    Notable for as above  EKG   EKG Interpretation Date/Time:  Friday March 04 2024 10:37:34 EDT Ventricular Rate:  106 PR Interval:    QRS Duration:  93 QT Interval:  298 QTC Calculation: 396 R Axis:   0  Text Interpretation: Interpretation limited secondary to artifact  Sinus tachycardia  Confirmed by Elnor Savant ([id number]) on 03/04/2024 10:46:25 AM         Imaging Studies ordered: I ordered imaging studies including CTAP CXR > CT PENDING  I independently visualized the following imaging with scope of interpretation limited to determining acute life threatening conditions related to emergency care; findings noted above I agree with the radiologist interpretation If any imaging was obtained with contrast I closely monitored patient for any  possible adverse reaction a/w contrast administration in the emergency department   Medicines ordered and prescription drug management: Meds ordered this encounter  Medications   ondansetron  (ZOFRAN ) injection 4 mg   lactated ringers  bolus 1,000 mL   lactated ringers  infusion   cefTRIAXone  (ROCEPHIN ) 2 g in sodium chloride  0.9 % 100 mL IVPB    Antibiotic Indication::   Cellulitis    -I have reviewed the patients home medicines and have made adjustments as needed   Consultations Obtained: na   Cardiac Monitoring: The patient was maintained on a cardiac monitor.  I personally viewed and interpreted the cardiac monitored which showed an underlying rhythm of: sinus tachy Continuous pulse oximetry interpreted by myself, 98% on Ra.    Social Determinants of Health:  Diagnosis or treatment significantly limited by social determinants of health: former smoker   Reevaluation: After the interventions noted above, I reevaluated the patient and found that they have improved  Co morbidities that complicate the patient evaluation  Past Medical History:  Diagnosis Date   Actinic keratosis 03/13/2009   Arthritis    BENIGN PROSTATIC HYPERTROPHY, HX OF 04/07/2009   CARCINOMA, SKIN, SQUAMOUS CELL 09/11/2009   COLONIC POLYPS, HX OF 03/13/2009   Essential hypertension    GERD 03/13/2009   Hyperlipidemia    INSOMNIA, CHRONIC 09/11/2009   Persistent atrial fibrillation (HCC) 04/07/2009   a. s/p afib ablation at [facility] x 2 in 2010.   POLYPECTOMY, HX OF 04/07/2009   Premature atrial contractions    PULMONARY NODULE 09/11/2009   PVC's (premature ventricular contractions)    Rosacea 03/13/2009   SKIN CANCER, HX OF 03/13/2009      Dispostion: Disposition decision including need for hospitalization was considered, and patient disposition pending at time of sign out.    Final Clinical Impression(s) / ED Diagnoses Final diagnoses:  Sepsis, due to unspecified organism, unspecified whether acute  organ dysfunction present (HCC)  Cellulitis of left lower extremity        Elnor Jayson LABOR, DO 03/04/24 1644

## 2024-03-04 NOTE — ED Notes (Signed)
 This nurse called CCMD to have patient monitored and admitted.

## 2024-03-04 NOTE — Sepsis Progress Note (Signed)
 Elink following code sepsis

## 2024-03-04 NOTE — ED Triage Notes (Signed)
 Patient presents to ed via [facility] from home states this am wife thought pt was warm to touch , took his temp of which was 102 patient started c/o left lower leg pain with redness and edema. C/o 1 episode of n/v this am. Also c/o burning with urination. Alert oriented.

## 2024-03-04 NOTE — ED Provider Notes (Signed)
 The patient's patient awaiting imaging, likely admission  6:25 PM CT imaging consistent with diverticulitis.  Patient's lactic acidosis is improved, blood pressure is normal, but he meets SIRS criteria from initial vital signs, and with leukocytosis patient will receive Flagyl  in addition to the ceftriaxone  he received previously.  Admission pending.   Garrick Charleston, MD 03/04/24 [phone]

## 2024-03-04 NOTE — ED Notes (Signed)
 RN- Redell P. Rochester was provided a copy of Interior and spatial designer.

## 2024-03-04 NOTE — Progress Notes (Addendum)
 VASCULAR LAB    Left lower extremity venous duplex has been performed.  See CV proc for preliminary results.  Gave verbal report to Dr. Elnor LIS, [facility], RVT 03/04/2024, 12:15 PM

## 2024-03-04 NOTE — Telephone Encounter (Signed)
   FYI Only or Action Required?: FYI only for provider.  Patient was last seen in primary care on 02/05/2024 by Micheal Wolm ORN, MD.  Called Nurse Triage reporting Leg Swelling.  Symptoms began several weeks ago.  Interventions attempted: Nothing.  Symptoms are: rapidly worsening.  Triage Disposition: Go to ED Now (or PCP Triage)  Patient/caregiver understands and will follow disposition?: Yes                Copied from CRM [id number]. Topic: Clinical - Red Word Triage >> Mar 04, 2024  8:33 AM Viola F wrote: Patient feet/ankles are swollen and he's having aching pain in both legs - spouse Elveria request appt for today Reason for Disposition  [1] Swelling is painful to touch AND [2] fever  Answer Assessment - Initial Assessment Questions Bilateral feet/ankle/leg swelling pain to touch.  Temp 100.2. Recommended ED due to sx and if patient unable to walk call 911.      1. ONSET: When did the swelling start? (e.g., minutes, hours, days)     Worsening now worse in feet and ankles 2. LOCATION: What part of the leg is swollen?  Are both legs swollen or just one leg?     Feet ankles up to knees  3. SEVERITY: How bad is the swelling? (e.g., localized; mild, moderate, severe)     Worsening since last OV 4. REDNESS: Is there redness or signs of infection?     na 5. PAIN: Is the swelling painful to touch? If Yes, ask: How painful is it?   (Scale 1-10; mild, moderate or severe)     Painful to touch 6. FEVER: Do you have a fever? If Yes, ask: What is it, how was it measured, and when did it start?      na 7. CAUSE: What do you think is causing the leg swelling?     Hx  8. MEDICAL HISTORY: Do you have a history of blood clots (e.g., DVT), cancer, heart failure, kidney disease, or liver failure?     Hx  9. RECURRENT SYMPTOM: Have you had leg swelling before? If Yes, ask: When was the last time? What happened that time?     Yes  10. OTHER  SYMPTOMS: Do you have any other symptoms? (e.g., chest pain, difficulty breathing)       Pain to touch legs, difficulty to urinate , not able to wear shoes, shaking  11. PREGNANCY: Is there any chance you are pregnant? When was your last menstrual period?       na  Protocols used: Leg Swelling and Edema-A-AH

## 2024-03-05 DIAGNOSIS — E872 Acidosis, unspecified: Secondary | ICD-10-CM | POA: Diagnosis present

## 2024-03-05 DIAGNOSIS — Z85828 Personal history of other malignant neoplasm of skin: Secondary | ICD-10-CM | POA: Diagnosis not present

## 2024-03-05 DIAGNOSIS — A419 Sepsis, unspecified organism: Secondary | ICD-10-CM | POA: Diagnosis present

## 2024-03-05 DIAGNOSIS — Z860100 Personal history of colon polyps, unspecified: Secondary | ICD-10-CM | POA: Diagnosis not present

## 2024-03-05 DIAGNOSIS — Z8249 Family history of ischemic heart disease and other diseases of the circulatory system: Secondary | ICD-10-CM | POA: Diagnosis not present

## 2024-03-05 DIAGNOSIS — D539 Nutritional anemia, unspecified: Secondary | ICD-10-CM | POA: Insufficient documentation

## 2024-03-05 DIAGNOSIS — I4819 Other persistent atrial fibrillation: Secondary | ICD-10-CM | POA: Diagnosis present

## 2024-03-05 DIAGNOSIS — R609 Edema, unspecified: Secondary | ICD-10-CM | POA: Insufficient documentation

## 2024-03-05 DIAGNOSIS — E785 Hyperlipidemia, unspecified: Secondary | ICD-10-CM | POA: Diagnosis present

## 2024-03-05 DIAGNOSIS — Z888 Allergy status to other drugs, medicaments and biological substances status: Secondary | ICD-10-CM | POA: Diagnosis not present

## 2024-03-05 DIAGNOSIS — Z800 Family history of malignant neoplasm of digestive organs: Secondary | ICD-10-CM | POA: Diagnosis not present

## 2024-03-05 DIAGNOSIS — M1990 Unspecified osteoarthritis, unspecified site: Secondary | ICD-10-CM | POA: Diagnosis present

## 2024-03-05 DIAGNOSIS — N400 Enlarged prostate without lower urinary tract symptoms: Secondary | ICD-10-CM | POA: Diagnosis present

## 2024-03-05 DIAGNOSIS — D631 Anemia in chronic kidney disease: Secondary | ICD-10-CM | POA: Diagnosis present

## 2024-03-05 DIAGNOSIS — Z8701 Personal history of pneumonia (recurrent): Secondary | ICD-10-CM | POA: Diagnosis not present

## 2024-03-05 DIAGNOSIS — L0390 Cellulitis, unspecified: Secondary | ICD-10-CM | POA: Diagnosis present

## 2024-03-05 DIAGNOSIS — Z1152 Encounter for screening for COVID-19: Secondary | ICD-10-CM | POA: Diagnosis not present

## 2024-03-05 DIAGNOSIS — N1831 Chronic kidney disease, stage 3a: Secondary | ICD-10-CM | POA: Diagnosis present

## 2024-03-05 DIAGNOSIS — K219 Gastro-esophageal reflux disease without esophagitis: Secondary | ICD-10-CM | POA: Diagnosis present

## 2024-03-05 DIAGNOSIS — Z8619 Personal history of other infectious and parasitic diseases: Secondary | ICD-10-CM | POA: Diagnosis not present

## 2024-03-05 DIAGNOSIS — I129 Hypertensive chronic kidney disease with stage 1 through stage 4 chronic kidney disease, or unspecified chronic kidney disease: Secondary | ICD-10-CM | POA: Diagnosis present

## 2024-03-05 DIAGNOSIS — Z809 Family history of malignant neoplasm, unspecified: Secondary | ICD-10-CM | POA: Diagnosis not present

## 2024-03-05 DIAGNOSIS — K5732 Diverticulitis of large intestine without perforation or abscess without bleeding: Secondary | ICD-10-CM | POA: Diagnosis present

## 2024-03-05 DIAGNOSIS — K5792 Diverticulitis of intestine, part unspecified, without perforation or abscess without bleeding: Secondary | ICD-10-CM | POA: Diagnosis present

## 2024-03-05 DIAGNOSIS — Z79899 Other long term (current) drug therapy: Secondary | ICD-10-CM | POA: Diagnosis not present

## 2024-03-05 DIAGNOSIS — Z87891 Personal history of nicotine dependence: Secondary | ICD-10-CM | POA: Diagnosis not present

## 2024-03-05 DIAGNOSIS — Z7901 Long term (current) use of anticoagulants: Secondary | ICD-10-CM | POA: Diagnosis not present

## 2024-03-05 DIAGNOSIS — L03116 Cellulitis of left lower limb: Secondary | ICD-10-CM | POA: Diagnosis present

## 2024-03-05 LAB — BASIC METABOLIC PANEL WITH GFR
Anion gap: 10 (ref 5–15)
BUN: 25 mg/dL — ABNORMAL HIGH (ref 8–23)
CO2: 26 mmol/L (ref 22–32)
Calcium: 8.5 mg/dL — ABNORMAL LOW (ref 8.9–10.3)
Chloride: 105 mmol/L (ref 98–111)
Creatinine, Ser: 1.39 mg/dL — ABNORMAL HIGH (ref 0.61–1.24)
GFR, Estimated: 48 mL/min — ABNORMAL LOW (ref >60)
Glucose, Bld: 110 mg/dL — ABNORMAL HIGH (ref 70–99)
Potassium: 3.9 mmol/L (ref 3.5–5.1)
Sodium: 141 mmol/L (ref 135–145)

## 2024-03-05 LAB — RETICULOCYTES
Immature Retic Fract: 24.7 % — ABNORMAL HIGH (ref 2.3–15.9)
RBC.: 2.87 MIL/uL — ABNORMAL LOW (ref 4.22–5.81)
Retic Count, Absolute: 54.2 K/uL (ref 19.0–186.0)
Retic Ct Pct: 1.9 % (ref 0.4–3.1)

## 2024-03-05 LAB — CBC
HCT: 31.2 % — ABNORMAL LOW (ref 39.0–52.0)
Hemoglobin: 10.1 g/dL — ABNORMAL LOW (ref 13.0–17.0)
MCH: 34.9 pg — ABNORMAL HIGH (ref 26.0–34.0)
MCHC: 32.4 g/dL (ref 30.0–36.0)
MCV: 108 fL — ABNORMAL HIGH (ref 80.0–100.0)
Platelets: 144 K/uL — ABNORMAL LOW (ref 150–400)
RBC: 2.89 MIL/uL — ABNORMAL LOW (ref 4.22–5.81)
RDW: 17.3 % — ABNORMAL HIGH (ref 11.5–15.5)
WBC: 10.3 K/uL (ref 4.0–10.5)
nRBC: 0 % (ref 0.0–0.2)

## 2024-03-05 LAB — VITAMIN B12: Vitamin B-12: 857 pg/mL (ref 180–914)

## 2024-03-05 LAB — IRON AND TIBC
Iron: 16 ug/dL — ABNORMAL LOW (ref 45–182)
Saturation Ratios: 6 % — ABNORMAL LOW (ref 17.9–39.5)
TIBC: 266 ug/dL (ref 250–450)
UIBC: 250 ug/dL

## 2024-03-05 LAB — FERRITIN: Ferritin: 143 ng/mL (ref 24–336)

## 2024-03-05 LAB — SEDIMENTATION RATE: Sed Rate: 30 mm/h — ABNORMAL HIGH (ref 0–16)

## 2024-03-05 LAB — FOLATE: Folate: 9.9 ng/mL (ref >5.9)

## 2024-03-05 MED ORDER — TAMSULOSIN HCL 0.4 MG PO CAPS
0.4000 mg | ORAL_CAPSULE | Freq: Two times a day (BID) | ORAL | Status: DC
  Administered 2024-03-05 – 2024-03-10 (×10): 0.4 mg via ORAL
  Filled 2024-03-05 (×10): qty 1

## 2024-03-05 MED ORDER — OXYCODONE HCL 5 MG PO TABS
5.0000 mg | ORAL_TABLET | ORAL | Status: DC | PRN
  Filled 2024-03-05: qty 1

## 2024-03-05 MED ORDER — ACETAMINOPHEN 500 MG PO TABS
1000.0000 mg | ORAL_TABLET | Freq: Three times a day (TID) | ORAL | Status: DC
  Administered 2024-03-05 – 2024-03-10 (×14): 1000 mg via ORAL
  Filled 2024-03-05 (×14): qty 2

## 2024-03-05 MED ORDER — OXYCODONE HCL 5 MG PO TABS
2.5000 mg | ORAL_TABLET | ORAL | Status: DC | PRN
  Administered 2024-03-05 – 2024-03-07 (×2): 2.5 mg via ORAL
  Filled 2024-03-05: qty 1

## 2024-03-05 MED ORDER — METRONIDAZOLE 500 MG PO TABS
500.0000 mg | ORAL_TABLET | Freq: Two times a day (BID) | ORAL | Status: DC
  Administered 2024-03-05 – 2024-03-08 (×6): 500 mg via ORAL
  Filled 2024-03-05 (×6): qty 1

## 2024-03-05 NOTE — Progress Notes (Addendum)
 PROGRESS NOTE    [name]  FMW:[id number] DOB: 1932-04-02 DOA: 03/04/2024 PCP: [name], MD  Chief Complaint  Patient presents with   Fever    Brief Narrative:   [name] is [name] 89 y.o. male with history of [name]-fib, hypertension, chronic kidney disease stage III, chronic anemia, GERD admitted in October 2024 for MSSA bacteremia in the setting of pneumonia started experiencing fever and chills this morning following which patient had multiple episodes of nausea vomiting with some abdominal discomfort.  Patient also has been having intermittently dysuria for few weeks.  Denies any chest pain productive cough.  Patient recently has been using increased dose of diuresis due to lower extremity edema.   He's been admitted for L leg cellulitis and possible diverticulitis.  Assessment & Plan:   Principal Problem:   Diverticulitis Active Problems:   Essential hypertension   ATRIAL FIBRILLATION   CKD (chronic kidney disease) stage 3, GFR 30-59 ml/min (HCC)   Hyperlipidemia   GERD (gastroesophageal reflux disease)   BPH (benign prostatic hyperplasia)   Macrocytic anemia   Chronic edema  Sepsis due Left Lower Extremity Cellulitis Rules in with leukocytosis, tachycardia, tachypnea - cellulitis (and diverticulitis below) Redness, tenderness to L leg - ROM of ankle seems equal when compared to L, low suspicion for joint involvement.  No fluctuance or crepitus.  This limits his ability to walk.  Continue ceftriaxone  (given concern for diverticulitis below) Blood cultures pending  Follow inflammatory markers, consider additional imaging  PT/OT  Acute diverticulitis This may be incidental, but per EDP note, he did have N/V, abdominal discomfort No abdominal TTP today Continue ceftriaxone , flagyl  for now  Awa Bachicha-fib presently in sinus rhythm takes Eliquis  and metoprolol .  Hypertension on amlodipine  and metoprolol .  Hyperlipidemia on statins.  Chronic bilateral lower  extremity edema  Lasix  on hold  BPH on Proscar  and Flomax .  Chronic kidney disease stage III creatinine at around baseline.  Macrocytic anemia  Labs c/w iron def, AOCD  GERD on PPI.    DVT prophylaxis: eliquis  Code Status: full Family Communication: wife at bedside 7/12 Disposition:   Status is: Observation The patient remains OBS appropriate and will d/c before 2 midnights.   Consultants:  none  Procedures:  US  Summary:  RIGHT:  - No evidence of common femoral vein obstruction.         LEFT:      - There is no evidence of deep vein thrombosis in the lower extremity.  However, portions of this examination were limited- see technologist  comments above.    - No cystic structure found in the popliteal fossa.   Antimicrobials:  Anti-infectives (From admission, onward)    Start     Dose/Rate Route Frequency Ordered Stop   03/05/24 1500  cefTRIAXone  (ROCEPHIN ) 2 g in sodium chloride  0.9 % 100 mL IVPB        2 g 200 mL/hr over 30 Minutes Intravenous Every 24 hours 03/04/24 2106     03/05/24 0600  metroNIDAZOLE  (FLAGYL ) IVPB 500 mg        500 mg 100 mL/hr over 60 Minutes Intravenous Every 12 hours 03/04/24 2106     03/04/24 1830  metroNIDAZOLE  (FLAGYL ) IVPB 500 mg        500 mg 100 mL/hr over 60 Minutes Intravenous  Once 03/04/24 1824 03/04/24 1953   03/04/24 1530  cefTRIAXone  (ROCEPHIN ) 2 g in sodium chloride  0.9 % 100 mL IVPB        2 g  200 mL/hr over 30 Minutes Intravenous Once 03/04/24 1525 03/04/24 1830       Subjective: C/o LLE pain  Objective: Vitals:   03/04/24 2000 03/04/24 2117 03/05/24 0602 03/05/24 0727  BP: 136/68 (!) 126/55 (!) 128/53 (!) 118/50  Pulse: 90 91 74 75  Resp: 19 17 16 17   Temp:  99 F (37.2 C) 98.8 F (37.1 C) 98.4 F (36.9 C)  TempSrc:   Oral Oral  SpO2: 98% 94% 94% 94%  Weight:      Height:        Intake/Output Summary (Last 24 hours) at 03/05/2024 0945 Last data filed at 03/05/2024 0700 Gross per 24 hour   Intake 1540 ml  Output --  Net 1540 ml   Filed Weights   03/04/24 1032 03/04/24 1258  Weight: 82 kg 83.5 kg    Examination:  General exam: Appears calm and comfortable  Respiratory system: unlabored Cardiovascular system: RRR Gastrointestinal system: Abdomen is nondistended, soft and nontender Central nervous system: Alert and oriented. No focal neurological deficits. Extremities: LLE redness and tenderness - equal ROM of ankle bilaterally   Data Reviewed: I have personally reviewed following labs and imaging studies  CBC: Recent Labs  Lab 03/04/24 1046 03/05/24 0656  WBC 13.5* 10.3  NEUTROABS 11.4*  --   HGB 11.5* 10.1*  HCT 35.8* 31.2*  MCV 106.5* 108.0*  PLT 186 144*    Basic Metabolic Panel: Recent Labs  Lab 03/04/24 1046 03/05/24 0656  NA 143 141  K 4.0 3.9  CL 104 105  CO2 27 26  GLUCOSE 136* 110*  BUN 27* 25*  CREATININE 1.47* 1.39*  CALCIUM 9.0 8.5*    GFR: Estimated Creatinine Clearance: 39.9 mL/min ([place]) (by C-G formula based on SCr of 1.39 mg/dL (H)).  Liver Function Tests: Recent Labs  Lab 03/04/24 1046  AST 30  ALT 17  ALKPHOS 50  BILITOT 1.5*  PROT 5.7*  ALBUMIN 3.5    CBG: No results for input(s): GLUCAP in the last 168 hours.   Recent Results (from the past 240 hours)  Blood Culture (routine x 2)     Status: None (Preliminary result)   Collection Time: 03/04/24 10:46 AM   Specimen: BLOOD  Result Value Ref Range Status   Specimen Description BLOOD SITE NOT SPECIFIED  Final   Special Requests   Final    BOTTLES DRAWN AEROBIC AND ANAEROBIC Blood Culture results may not be optimal due to an inadequate volume of blood received in culture bottles   Culture   Final    NO GROWTH < 24 HOURS Performed at [facility], [address] [place], [place] [address]    Report Status PENDING  Incomplete  Blood Culture (routine x 2)     Status: None (Preliminary result)   Collection Time: 03/04/24 10:51 AM   Specimen: BLOOD   Result Value Ref Range Status   Specimen Description BLOOD SITE NOT SPECIFIED  Final   Special Requests   Final    BOTTLES DRAWN AEROBIC AND ANAEROBIC Blood Culture results may not be optimal due to an inadequate volume of blood received in culture bottles   Culture   Final    NO GROWTH < 24 HOURS Performed at [facility], [address], [place], [place] [address]    Report Status PENDING  Incomplete  Resp panel by RT-PCR (RSV, Flu Tallulah Hosman&B, Covid) Anterior Nasal Swab     Status: None   Collection Time: 03/04/24  3:23  PM   Specimen: Anterior Nasal Swab  Result Value Ref Range Status   SARS Coronavirus 2 by RT PCR NEGATIVE NEGATIVE Final   Influenza Rosio Weiss by PCR NEGATIVE NEGATIVE Final   Influenza B by PCR NEGATIVE NEGATIVE Final    Comment: (NOTE) The Xpert Xpress SARS-CoV-2/FLU/RSV plus assay is intended as an aid in the diagnosis of influenza from Nasopharyngeal swab specimens and should not be used as Takima Encina sole basis for treatment. Nasal washings and aspirates are unacceptable for Xpert Xpress SARS-CoV-2/FLU/RSV testing.  Fact Sheet for Patients: BloggerCourse.com  Fact Sheet for Healthcare Providers: SeriousBroker.it  This test is not yet approved or cleared by the [place]  FDA and has been authorized for detection and/or diagnosis of SARS-CoV-2 by FDA under an Emergency Use Authorization (EUA). This EUA will remain in effect (meaning this test can be used) for the duration of the COVID-19 declaration under Section 564(b)(1) of the Act, 21 U.S.C. section 360bbb-3(b)(1), unless the authorization is terminated or revoked.     Resp Syncytial Virus by PCR NEGATIVE NEGATIVE Final    Comment: (NOTE) Fact Sheet for Patients: BloggerCourse.com  Fact Sheet for Healthcare Providers: SeriousBroker.it  This test is not yet approved or cleared by the [place]  FDA and has  been authorized for detection and/or diagnosis of SARS-CoV-2 by FDA under an Emergency Use Authorization (EUA). This EUA will remain in effect (meaning this test can be used) for the duration of the COVID-19 declaration under Section 564(b)(1) of the Act, 21 U.S.C. section 360bbb-3(b)(1), unless the authorization is terminated or revoked.  Performed at [facility], [address], [place], [place] [address]          Radiology Studies: CT ABDOMEN PELVIS W CONTRAST Result Date: 03/04/2024 CLINICAL DATA:  Acute nonlocalized abdominal pain, dysuria, fever EXAM: CT ABDOMEN AND PELVIS WITH CONTRAST TECHNIQUE: Multidetector CT imaging of the abdomen and pelvis was performed using the standard protocol following bolus administration of intravenous contrast. RADIATION DOSE REDUCTION: This exam was performed according to the departmental dose-optimization program which includes automated exposure control, adjustment of the mA and/or kV according to patient size and/or use of iterative reconstruction technique. CONTRAST:  60mL OMNIPAQUE  IOHEXOL  350 MG/ML SOLN COMPARISON:  None Available. FINDINGS: Lower chest: Lingular atelectasis/scarring. Pleural calcifications in the left lower lung. No acute abnormality. Hepatobiliary: No acute abnormality. Pancreas: Unremarkable. Spleen: Unremarkable. Adrenals/Urinary Tract: Unremarkable adrenal glands. No urinary calculi or hydronephrosis. Multiple small bladder diverticula. Stomach/Bowel: Mild wall thickening and stranding about the mid descending colon. Scattered colonic diverticulosis. No bowel obstruction. Stomach is within normal limits normal appendix. Vascular/Lymphatic: Aortic atherosclerosis. No enlarged abdominal or pelvic lymph nodes. Reproductive: Unremarkable. Other: No free intraperitoneal fluid or air.  No abscess. Musculoskeletal: No acute fracture. IMPRESSION: 1. Mild wall thickening and stranding about the mid descending colon, likely mild  diverticulitis. No abscess or free air. 2. Aortic Atherosclerosis (ICD10-I70.0). Electronically Signed   By: Norman Gatlin M.D.   On: 03/04/2024 18:19   VAS US  LOWER EXTREMITY VENOUS (DVT) (7a-7p) Result Date: 03/04/2024  Lower Venous DVT Study Patient Name:  QUINCY BOY  Date of Exam:   03/04/2024 Medical Rec #: [id number]         Accession #:    [id number] Date of Birth: October 24, 1931         Patient Gender: M Patient Age:   56 years Exam Location:  [facility] Procedure:      VAS US  LOWER EXTREMITY VENOUS (DVT) Referring Phys: JAYSON  GRAY --------------------------------------------------------------------------------  Indications: Pain, Swelling, Erythema, and Fever, cellulitis.  Limitations: Pain with minimal touch, patient kicking and twitching leg throughout entire study. Comparison Study: No prior study on file Performing Technologist: Alberta Lis RVS  Examination Guidelines: Brenden Rudman complete evaluation includes B-mode imaging, spectral Doppler, color Doppler, and power Doppler as needed of all accessible portions of each vessel. Bilateral testing is considered an integral part of Julion Gatt complete examination. Limited examinations for reoccurring indications may be performed as noted. The reflux portion of the exam is performed with the patient in reverse Trendelenburg.  +-----+---------------+---------+-----------+----------+--------------+ RIGHTCompressibilityPhasicitySpontaneityPropertiesThrombus Aging +-----+---------------+---------+-----------+----------+--------------+ CFV  Full           Yes      Yes                                 +-----+---------------+---------+-----------+----------+--------------+ SFJ  Full                                                        +-----+---------------+---------+-----------+----------+--------------+   +---------+---------------+---------+-----------+----------+-------------------+ LEFT      CompressibilityPhasicitySpontaneityPropertiesThrombus Aging      +---------+---------------+---------+-----------+----------+-------------------+ CFV      Full           Yes      Yes                                      +---------+---------------+---------+-----------+----------+-------------------+ SFJ      Full                                                             +---------+---------------+---------+-----------+----------+-------------------+ FV Prox  Full           Yes      Yes                                      +---------+---------------+---------+-----------+----------+-------------------+ FV Mid                  Yes      Yes                  patent by color and                                                       Doppler             +---------+---------------+---------+-----------+----------+-------------------+ FV Distal               Yes      Yes                  patent by color and  Doppler             +---------+---------------+---------+-----------+----------+-------------------+ PFV      Full                                                             +---------+---------------+---------+-----------+----------+-------------------+ POP                                                   patent by color and                                                       Doppler             +---------+---------------+---------+-----------+----------+-------------------+ PTV                                                   Not well visualized +---------+---------------+---------+-----------+----------+-------------------+ PERO                                                  Not well visualized +---------+---------------+---------+-----------+----------+-------------------+     Summary: RIGHT: - No evidence of common femoral vein obstruction.   LEFT: - There is no  evidence of deep vein thrombosis in the lower extremity. However, portions of this examination were limited- see technologist comments above.  - No cystic structure found in the popliteal fossa.  *See table(s) above for measurements and observations. Electronically signed by Gaile New MD on 03/04/2024 at 5:18:42 PM.    Final    DG Chest Port 1 View Result Date: 03/04/2024 CLINICAL DATA:  Questionable sepsis - evaluate for abnormality EXAM: PORTABLE CHEST - 1 VIEW COMPARISON:  None available. FINDINGS: No focal airspace consolidation, pleural effusion, or pneumothorax. No cardiomegaly.No acute fracture or destructive lesion. IMPRESSION: No acute cardiopulmonary abnormality. Electronically Signed   By: Rogelia Myers M.D.   On: 03/04/2024 11:15        Scheduled Meds:  amLODipine   2.5 mg Oral Daily   apixaban   2.5 mg Oral BID   cyanocobalamin   500 mcg Oral Daily   finasteride   5 mg Oral Daily   fluticasone  furoate-vilanterol  1 puff Inhalation Daily   gabapentin   100 mg Oral BID   metoprolol  succinate  50 mg Oral Daily   pantoprazole   20 mg Oral Daily   pravastatin   20 mg Oral q1800   tamsulosin   0.4 mg Oral QPC breakfast   Continuous Infusions:  cefTRIAXone  (ROCEPHIN )  IV     lactated ringers  75 mL/hr at 03/04/24 2227   metronidazole  500 mg (03/05/24 0603)     LOS: 0 days    Time spent: over 30 min    Meliton Monte, MD [facility]   To contact the  attending provider between 7A-7P or the covering provider during after hours 7P-7A, please log into the web site [email or web address] and access using universal [place] password for that web site. If you do not have the password, please call the hospital operator.  03/05/2024, 9:45 AM

## 2024-03-05 NOTE — Plan of Care (Signed)

## 2024-03-05 NOTE — Plan of Care (Signed)
  Problem: Clinical Measurements: Goal: Ability to maintain clinical measurements within normal limits will improve Outcome: Progressing   Problem: Clinical Measurements: Goal: Will remain free from infection Outcome: Progressing   Problem: Activity: Goal: Risk for activity intolerance will decrease Outcome: Progressing   Problem: Safety: Goal: Ability to remain free from injury will improve Outcome: Progressing   Problem: Skin Integrity: Goal: Risk for impaired skin integrity will decrease Outcome: Progressing

## 2024-03-06 DIAGNOSIS — K5792 Diverticulitis of intestine, part unspecified, without perforation or abscess without bleeding: Secondary | ICD-10-CM | POA: Diagnosis not present

## 2024-03-06 LAB — CBC WITH DIFFERENTIAL/PLATELET
Abs Immature Granulocytes: 0.13 K/uL — ABNORMAL HIGH (ref 0.00–0.07)
Basophils Absolute: 0 K/uL (ref 0.0–0.1)
Basophils Relative: 0 %
Eosinophils Absolute: 0 K/uL (ref 0.0–0.5)
Eosinophils Relative: 1 %
HCT: 30.1 % — ABNORMAL LOW (ref 39.0–52.0)
Hemoglobin: 9.7 g/dL — ABNORMAL LOW (ref 13.0–17.0)
Immature Granulocytes: 2 %
Lymphocytes Relative: 14 %
Lymphs Abs: 0.9 K/uL (ref 0.7–4.0)
MCH: 34.9 pg — ABNORMAL HIGH (ref 26.0–34.0)
MCHC: 32.2 g/dL (ref 30.0–36.0)
MCV: 108.3 fL — ABNORMAL HIGH (ref 80.0–100.0)
Monocytes Absolute: 0.6 K/uL (ref 0.1–1.0)
Monocytes Relative: 10 %
Neutro Abs: 4.8 K/uL (ref 1.7–7.7)
Neutrophils Relative %: 73 %
Platelets: 128 K/uL — ABNORMAL LOW (ref 150–400)
RBC: 2.78 MIL/uL — ABNORMAL LOW (ref 4.22–5.81)
RDW: 17.2 % — ABNORMAL HIGH (ref 11.5–15.5)
WBC: 6.6 K/uL (ref 4.0–10.5)
nRBC: 0.5 % — ABNORMAL HIGH (ref 0.0–0.2)

## 2024-03-06 LAB — COMPREHENSIVE METABOLIC PANEL WITH GFR
ALT: 15 U/L (ref 0–44)
AST: 39 U/L (ref 15–41)
Albumin: 2.6 g/dL — ABNORMAL LOW (ref 3.5–5.0)
Alkaline Phosphatase: 44 U/L (ref 38–126)
Anion gap: 11 (ref 5–15)
BUN: 25 mg/dL — ABNORMAL HIGH (ref 8–23)
CO2: 22 mmol/L (ref 22–32)
Calcium: 8.4 mg/dL — ABNORMAL LOW (ref 8.9–10.3)
Chloride: 104 mmol/L (ref 98–111)
Creatinine, Ser: 1.53 mg/dL — ABNORMAL HIGH (ref 0.61–1.24)
GFR, Estimated: 43 mL/min — ABNORMAL LOW (ref >60)
Glucose, Bld: 127 mg/dL — ABNORMAL HIGH (ref 70–99)
Potassium: 3.9 mmol/L (ref 3.5–5.1)
Sodium: 137 mmol/L (ref 135–145)
Total Bilirubin: 0.7 mg/dL (ref 0.0–1.2)
Total Protein: 4.9 g/dL — ABNORMAL LOW (ref 6.5–8.1)

## 2024-03-06 LAB — C-REACTIVE PROTEIN: CRP: 16.9 mg/dL — ABNORMAL HIGH (ref <1.0)

## 2024-03-06 LAB — PHOSPHORUS: Phosphorus: 3.1 mg/dL (ref 2.5–4.6)

## 2024-03-06 LAB — MAGNESIUM: Magnesium: 1.9 mg/dL (ref 1.7–2.4)

## 2024-03-06 NOTE — Plan of Care (Signed)
   Problem: Education: Goal: Knowledge of General Education information will improve Description: Including pain rating scale, medication(s)/side effects and non-pharmacologic comfort measures Outcome: Progressing   Problem: Activity: Goal: Risk for activity intolerance will decrease Outcome: Progressing

## 2024-03-06 NOTE — Progress Notes (Addendum)
 PROGRESS NOTE    [name]  FMW:[id number] DOB: Apr 08, 1932 DOA: 03/04/2024 PCP: [name], MD  Chief Complaint  Patient presents with   Fever    Brief Narrative:   [name] is [name] 89 y.o. male with history of [name]-fib, hypertension, chronic kidney disease stage III, chronic anemia, GERD admitted in October 2024 for MSSA bacteremia in the setting of pneumonia started experiencing fever and chills this morning following which patient had multiple episodes of nausea vomiting with some abdominal discomfort.  Patient also has been having intermittently dysuria for few weeks.  Denies any chest pain productive cough.  Patient recently has been using increased dose of diuresis due to lower extremity edema.   He's been admitted for L leg cellulitis and possible diverticulitis.  Assessment & Plan:   Principal Problem:   Diverticulitis Active Problems:   Essential hypertension   ATRIAL FIBRILLATION   CKD (chronic kidney disease) stage 3, GFR 30-59 ml/min (HCC)   Hyperlipidemia   GERD (gastroesophageal reflux disease)   BPH (benign prostatic hyperplasia)   Macrocytic anemia   Chronic edema   Cellulitis  Sepsis due Left Lower Extremity Cellulitis Rules in with leukocytosis, tachycardia, tachypnea - cellulitis (and diverticulitis below) Redness, tenderness to L leg - ROM of ankle seems equal when compared to L, low suspicion for joint involvement.  No fluctuance or crepitus.  This limits his ability to walk.  Mild improvement today. Continue ceftriaxone  (given concern for diverticulitis below) Blood cultures pending  Follow inflammatory markers (CRP 16.9), consider additional imaging - will hold off given improvement in his pain  PT/OT  Acute diverticulitis This may be incidental, but per EDP note, he did have N/V, abdominal discomfort No abdominal TTP today Continue ceftriaxone , flagyl  for now  [name]-fib presently in sinus rhythm takes Eliquis  and  metoprolol .  Hypertension on amlodipine  and metoprolol .  Hyperlipidemia on statins.  Chronic bilateral lower extremity edema  Lasix  on hold  BPH on Proscar  and Flomax .  Chronic kidney disease stage III creatinine at around baseline.  Macrocytic anemia  Labs c/w iron def, AOCD  GERD on PPI.    DVT prophylaxis: eliquis  Code Status: full Family Communication: wife at bedside 7/13 Disposition:   Status is: Observation The patient remains OBS appropriate and will d/c before 2 midnights.   Consultants:  none  Procedures:  US  Summary:  RIGHT:  - No evidence of common femoral vein obstruction.         LEFT:      - There is no evidence of deep vein thrombosis in the lower extremity.  However, portions of this examination were limited- see technologist  comments above.    - No cystic structure found in the popliteal fossa.   Antimicrobials:  Anti-infectives (From admission, onward)    Start     Dose/Rate Route Frequency Ordered Stop   03/05/24 2200  metroNIDAZOLE  (FLAGYL ) tablet 500 mg        500 mg Oral Every 12 hours 03/05/24 1305     03/05/24 1500  cefTRIAXone  (ROCEPHIN ) 2 g in sodium chloride  0.9 % 100 mL IVPB        2 g 200 mL/hr over 30 Minutes Intravenous Every 24 hours 03/04/24 2106     03/05/24 0600  metroNIDAZOLE  (FLAGYL ) IVPB 500 mg  Status:  Discontinued        500 mg 100 mL/hr over 60 Minutes Intravenous Every 12 hours 03/04/24 2106 03/05/24 1305   03/04/24 1830  metroNIDAZOLE  (FLAGYL ) IVPB 500  mg        500 mg 100 mL/hr over 60 Minutes Intravenous  Once 03/04/24 1824 03/04/24 1953   03/04/24 1530  cefTRIAXone  (ROCEPHIN ) 2 g in sodium chloride  0.9 % 100 mL IVPB        2 g 200 mL/hr over 30 Minutes Intravenous Once 03/04/24 1525 03/04/24 1830       Subjective: Pain improved today  Objective: Vitals:   03/05/24 1343 03/05/24 2026 03/06/24 0511 03/06/24 0753  BP: (!) 120/51 (!) 133/59 (!) 128/55 128/71  Pulse: 76 69 71 70  Resp: 15 17  16 15   Temp: 98.3 F (36.8 C) 98.4 F (36.9 C) (P) 98 F (36.7 C) 98.6 F (37 C)  TempSrc: Oral Oral (P) Oral Oral  SpO2: 96% 96% 95% 95%  Weight:      Height:        Intake/Output Summary (Last 24 hours) at 03/06/2024 1145 Last data filed at 03/05/2024 1726 Gross per 24 hour  Intake 680 ml  Output --  Net 680 ml   Filed Weights   03/04/24 1032 03/04/24 1258  Weight: 82 kg 83.5 kg    Examination:  General: No acute distress. Cardiovascular: RRR Lungs: unlabored Abdomen: Soft, nontender, nondistended  Neurological: Alert. Moves all extremities 4 with equal strength. Cranial nerves II through XII grossly intact. Extremities: LLE erythema and TTP - no fluctuance or crepitus    Data Reviewed: I have personally reviewed following labs and imaging studies  CBC: Recent Labs  Lab 03/04/24 1046 03/05/24 0656 03/06/24 0831  WBC 13.5* 10.3 6.6  NEUTROABS 11.4*  --  4.8  HGB 11.5* 10.1* 9.7*  HCT 35.8* 31.2* 30.1*  MCV 106.5* 108.0* 108.3*  PLT 186 144* 128*    Basic Metabolic Panel: Recent Labs  Lab 03/04/24 1046 03/05/24 0656 03/06/24 0831  NA 143 141 137  K 4.0 3.9 3.9  CL 104 105 104  CO2 27 26 22   GLUCOSE 136* 110* 127*  BUN 27* 25* 25*  CREATININE 1.47* 1.39* 1.53*  CALCIUM 9.0 8.5* 8.4*  MG  --   --  1.9  PHOS  --   --  3.1    GFR: Estimated Creatinine Clearance: 36.3 mL/min ([name]) (by C-G formula based on SCr of 1.53 mg/dL (H)).  Liver Function Tests: Recent Labs  Lab 03/04/24 1046 03/06/24 0831  AST 30 39  ALT 17 15  ALKPHOS 50 44  BILITOT 1.5* 0.7  PROT 5.7* 4.9*  ALBUMIN 3.5 2.6*    CBG: No results for input(s): GLUCAP in the last 168 hours.   Recent Results (from the past 240 hours)  Blood Culture (routine x 2)     Status: None (Preliminary result)   Collection Time: 03/04/24 10:46 AM   Specimen: BLOOD  Result Value Ref Range Status   Specimen Description BLOOD SITE NOT SPECIFIED  Final   Special Requests   Final    BOTTLES  DRAWN AEROBIC AND ANAEROBIC Blood Culture results may not be optimal due to an inadequate volume of blood received in culture bottles   Culture   Final    NO GROWTH 2 DAYS Performed at [facility], [address] [place], [place] [address]    Report Status PENDING  Incomplete  Blood Culture (routine x 2)     Status: None (Preliminary result)   Collection Time: 03/04/24 10:51 AM   Specimen: BLOOD  Result Value Ref Range Status   Specimen Description BLOOD SITE NOT SPECIFIED  Final   Special Requests   Final    BOTTLES DRAWN AEROBIC AND ANAEROBIC Blood Culture results may not be optimal due to an inadequate volume of blood received in culture bottles   Culture   Final    NO GROWTH 2 DAYS Performed at [facility], [address], [place], [place] [address]    Report Status PENDING  Incomplete  Resp panel by RT-PCR (RSV, Flu [name]&B, Covid) Anterior Nasal Swab     Status: None   Collection Time: 03/04/24  3:23 PM   Specimen: Anterior Nasal Swab  Result Value Ref Range Status   SARS Coronavirus 2 by RT PCR NEGATIVE NEGATIVE Final   Influenza [name] by PCR NEGATIVE NEGATIVE Final   Influenza B by PCR NEGATIVE NEGATIVE Final    Comment: (NOTE) The Xpert Xpress SARS-CoV-2/FLU/RSV plus assay is intended as an aid in the diagnosis of influenza from Nasopharyngeal swab specimens and should not be used as [name] sole basis for treatment. Nasal washings and aspirates are unacceptable for Xpert Xpress SARS-CoV-2/FLU/RSV testing.  Fact Sheet for Patients: BloggerCourse.com  Fact Sheet for Healthcare Providers: SeriousBroker.it  This test is not yet approved or cleared by the [place]  FDA and has been authorized for detection and/or diagnosis of SARS-CoV-2 by FDA under an Emergency Use Authorization (EUA). This EUA will remain in effect (meaning this test can be used) for the duration of the COVID-19 declaration under Section 564(b)(1)  of the Act, 21 U.S.C. section 360bbb-3(b)(1), unless the authorization is terminated or revoked.     Resp Syncytial Virus by PCR NEGATIVE NEGATIVE Final    Comment: (NOTE) Fact Sheet for Patients: BloggerCourse.com  Fact Sheet for Healthcare Providers: SeriousBroker.it  This test is not yet approved or cleared by the [place]  FDA and has been authorized for detection and/or diagnosis of SARS-CoV-2 by FDA under an Emergency Use Authorization (EUA). This EUA will remain in effect (meaning this test can be used) for the duration of the COVID-19 declaration under Section 564(b)(1) of the Act, 21 U.S.C. section 360bbb-3(b)(1), unless the authorization is terminated or revoked.  Performed at [facility], [address] [place], [place] [address]          Radiology Studies: CT ABDOMEN PELVIS W CONTRAST Result Date: 03/04/2024 CLINICAL DATA:  Acute nonlocalized abdominal pain, dysuria, fever EXAM: CT ABDOMEN AND PELVIS WITH CONTRAST TECHNIQUE: Multidetector CT imaging of the abdomen and pelvis was performed using the standard protocol following bolus administration of intravenous contrast. RADIATION DOSE REDUCTION: This exam was performed according to the departmental dose-optimization program which includes automated exposure control, adjustment of the mA and/or kV according to patient size and/or use of iterative reconstruction technique. CONTRAST:  60mL OMNIPAQUE  IOHEXOL  350 MG/ML SOLN COMPARISON:  None Available. FINDINGS: Lower chest: Lingular atelectasis/scarring. Pleural calcifications in the left lower lung. No acute abnormality. Hepatobiliary: No acute abnormality. Pancreas: Unremarkable. Spleen: Unremarkable. Adrenals/Urinary Tract: Unremarkable adrenal glands. No urinary calculi or hydronephrosis. Multiple small bladder diverticula. Stomach/Bowel: Mild wall thickening and stranding about the mid descending colon.  Scattered colonic diverticulosis. No bowel obstruction. Stomach is within normal limits normal appendix. Vascular/Lymphatic: Aortic atherosclerosis. No enlarged abdominal or pelvic lymph nodes. Reproductive: Unremarkable. Other: No free intraperitoneal fluid or air.  No abscess. Musculoskeletal: No acute fracture. IMPRESSION: 1. Mild wall thickening and stranding about the mid descending colon, likely mild diverticulitis. No abscess or free air. 2. Aortic Atherosclerosis (ICD10-I70.0). Electronically Signed   By: Norman Charletta HERO.D.  On: 03/04/2024 18:19   VAS US  LOWER EXTREMITY VENOUS (DVT) (7a-7p) Result Date: 03/04/2024  Lower Venous DVT Study Patient Name:  [name]  Date of Exam:   03/04/2024 Medical Rec #: [id number]         Accession #:    [id number] Date of Birth: [birth year]         Patient Gender: M Patient Age:   30 years Exam Location:  [facility] Procedure:      VAS US  LOWER EXTREMITY VENOUS (DVT) Referring Phys: JAYSON PEREYRA --------------------------------------------------------------------------------  Indications: Pain, Swelling, Erythema, and Fever, cellulitis.  Limitations: Pain with minimal touch, patient kicking and twitching leg throughout entire study. Comparison Study: No prior study on file Performing Technologist: Alberta Lis RVS  Examination Guidelines: Sariyah Corcino complete evaluation includes B-mode imaging, spectral Doppler, color Doppler, and power Doppler as needed of all accessible portions of each vessel. Bilateral testing is considered an integral part of Raiford Fetterman complete examination. Limited examinations for reoccurring indications may be performed as noted. The reflux portion of the exam is performed with the patient in reverse Trendelenburg.  +-----+---------------+---------+-----------+----------+--------------+ RIGHTCompressibilityPhasicitySpontaneityPropertiesThrombus Aging +-----+---------------+---------+-----------+----------+--------------+ CFV  Full            Yes      Yes                                 +-----+---------------+---------+-----------+----------+--------------+ SFJ  Full                                                        +-----+---------------+---------+-----------+----------+--------------+   +---------+---------------+---------+-----------+----------+-------------------+ LEFT     CompressibilityPhasicitySpontaneityPropertiesThrombus Aging      +---------+---------------+---------+-----------+----------+-------------------+ CFV      Full           Yes      Yes                                      +---------+---------------+---------+-----------+----------+-------------------+ SFJ      Full                                                             +---------+---------------+---------+-----------+----------+-------------------+ FV Prox  Full           Yes      Yes                                      +---------+---------------+---------+-----------+----------+-------------------+ FV Mid                  Yes      Yes                  patent by color and  Doppler             +---------+---------------+---------+-----------+----------+-------------------+ FV Distal               Yes      Yes                  patent by color and                                                       Doppler             +---------+---------------+---------+-----------+----------+-------------------+ PFV      Full                                                             +---------+---------------+---------+-----------+----------+-------------------+ POP                                                   patent by color and                                                       Doppler             +---------+---------------+---------+-----------+----------+-------------------+ PTV                                                    Not well visualized +---------+---------------+---------+-----------+----------+-------------------+ PERO                                                  Not well visualized +---------+---------------+---------+-----------+----------+-------------------+     Summary: RIGHT: - No evidence of common femoral vein obstruction.   LEFT: - There is no evidence of deep vein thrombosis in the lower extremity. However, portions of this examination were limited- see technologist comments above.  - No cystic structure found in the popliteal fossa.  *See table(s) above for measurements and observations. Electronically signed by Gaile New MD on 03/04/2024 at 5:18:42 PM.    Final         Scheduled Meds:  acetaminophen   1,000 mg Oral Q8H   amLODipine   2.5 mg Oral Daily   apixaban   2.5 mg Oral BID   cyanocobalamin   500 mcg Oral Daily   finasteride   5 mg Oral Daily   fluticasone  furoate-vilanterol  1 puff Inhalation Daily   gabapentin   100 mg Oral BID   metoprolol  succinate  50 mg Oral Daily   metroNIDAZOLE   500 mg Oral Q12H   pantoprazole   20 mg Oral Daily   pravastatin   20 mg Oral q1800  tamsulosin   0.4 mg Oral BID   Continuous Infusions:  cefTRIAXone  (ROCEPHIN )  IV 2 g (03/05/24 1509)     LOS: 1 day    Time spent: over 30 min    Meliton Monte, MD [facility]   To contact the attending provider between 7A-7P or the covering provider during after hours 7P-7A, please log into the web site [email or web address] and access using universal [place] password for that web site. If you do not have the password, please call the hospital operator.  03/06/2024, 11:45 AM

## 2024-03-06 NOTE — Plan of Care (Signed)
   Problem: Education: Goal: Knowledge of General Education information will improve Description: Including pain rating scale, medication(s)/side effects and non-pharmacologic comfort measures Outcome: Progressing   Problem: Pain Managment: Goal: General experience of comfort will improve and/or be controlled Outcome: Progressing   Problem: Safety: Goal: Ability to remain free from injury will improve Outcome: Progressing

## 2024-03-07 DIAGNOSIS — K5792 Diverticulitis of intestine, part unspecified, without perforation or abscess without bleeding: Secondary | ICD-10-CM | POA: Diagnosis not present

## 2024-03-07 LAB — COMPREHENSIVE METABOLIC PANEL WITH GFR
ALT: 18 U/L (ref 0–44)
AST: 33 U/L (ref 15–41)
Albumin: 2.6 g/dL — ABNORMAL LOW (ref 3.5–5.0)
Alkaline Phosphatase: 46 U/L (ref 38–126)
Anion gap: 11 (ref 5–15)
BUN: 25 mg/dL — ABNORMAL HIGH (ref 8–23)
CO2: 24 mmol/L (ref 22–32)
Calcium: 8.3 mg/dL — ABNORMAL LOW (ref 8.9–10.3)
Chloride: 103 mmol/L (ref 98–111)
Creatinine, Ser: 1.63 mg/dL — ABNORMAL HIGH (ref 0.61–1.24)
GFR, Estimated: 40 mL/min — ABNORMAL LOW (ref >60)
Glucose, Bld: 127 mg/dL — ABNORMAL HIGH (ref 70–99)
Potassium: 3.9 mmol/L (ref 3.5–5.1)
Sodium: 138 mmol/L (ref 135–145)
Total Bilirubin: 0.7 mg/dL (ref 0.0–1.2)
Total Protein: 5.1 g/dL — ABNORMAL LOW (ref 6.5–8.1)

## 2024-03-07 LAB — CBC WITH DIFFERENTIAL/PLATELET
Abs Immature Granulocytes: 0.25 K/uL — ABNORMAL HIGH (ref 0.00–0.07)
Basophils Absolute: 0 K/uL (ref 0.0–0.1)
Basophils Relative: 0 %
Eosinophils Absolute: 0 K/uL (ref 0.0–0.5)
Eosinophils Relative: 1 %
HCT: 30.2 % — ABNORMAL LOW (ref 39.0–52.0)
Hemoglobin: 9.8 g/dL — ABNORMAL LOW (ref 13.0–17.0)
Immature Granulocytes: 4 %
Lymphocytes Relative: 15 %
Lymphs Abs: 1 K/uL (ref 0.7–4.0)
MCH: 34.5 pg — ABNORMAL HIGH (ref 26.0–34.0)
MCHC: 32.5 g/dL (ref 30.0–36.0)
MCV: 106.3 fL — ABNORMAL HIGH (ref 80.0–100.0)
Monocytes Absolute: 0.6 K/uL (ref 0.1–1.0)
Monocytes Relative: 9 %
Neutro Abs: 4.8 K/uL (ref 1.7–7.7)
Neutrophils Relative %: 71 %
Platelets: 149 K/uL — ABNORMAL LOW (ref 150–400)
RBC: 2.84 MIL/uL — ABNORMAL LOW (ref 4.22–5.81)
RDW: 16.3 % — ABNORMAL HIGH (ref 11.5–15.5)
WBC: 6.7 K/uL (ref 4.0–10.5)
nRBC: 0 % (ref 0.0–0.2)

## 2024-03-07 LAB — C-REACTIVE PROTEIN: CRP: 11 mg/dL — ABNORMAL HIGH (ref <1.0)

## 2024-03-07 NOTE — Plan of Care (Signed)

## 2024-03-07 NOTE — Progress Notes (Signed)
 PROGRESS NOTE    [name]  FMW:[id number] DOB: [birth year] DOA: 03/04/2024 PCP: Steven Wolm LELON, MD  Chief Complaint  Patient presents with   Fever    Brief Narrative:   [name] is [name] 89 y.o. male with history of [name]-fib, hypertension, chronic kidney disease stage III, chronic anemia, GERD admitted in October 2024 for MSSA bacteremia in the setting of pneumonia started experiencing fever and chills this morning following which patient had multiple episodes of nausea vomiting with some abdominal discomfort.  Patient also has been having intermittently dysuria for few weeks.  Denies any chest pain productive cough.  Patient recently has been using increased dose of diuresis due to lower extremity edema.   He's been admitted for L leg cellulitis and possible diverticulitis.  Assessment & Plan:   Principal Problem:   Diverticulitis Active Problems:   Essential hypertension   ATRIAL FIBRILLATION   CKD (chronic kidney disease) stage 3, GFR 30-59 ml/min (HCC)   Hyperlipidemia   GERD (gastroesophageal reflux disease)   BPH (benign prostatic hyperplasia)   Macrocytic anemia   Chronic edema   Cellulitis  Sepsis due Left Lower Extremity Cellulitis Rules in with leukocytosis, tachycardia, tachypnea - cellulitis (and diverticulitis below) Redness, tenderness to L leg - ROM of ankle seems equal when compared to L, low suspicion for joint involvement.  No fluctuance or crepitus.  This limits his ability to walk.  Mild improvement today. Continue ceftriaxone  (given concern for diverticulitis below) Blood cultures pending  Follow inflammatory markers (CRP 16.9 -> 11) - he's gradually improving - no indication for additional imaging based on exam/improvement at this time PT/OT  Acute diverticulitis This may be incidental, but per EDP note, he did have N/V, abdominal discomfort No abdominal TTP today Continue ceftriaxone , flagyl  for now  [name]-fib presently in sinus rhythm takes  Eliquis  and metoprolol .  Hypertension on amlodipine  and metoprolol .  Hyperlipidemia on statins.  Chronic bilateral lower extremity edema  Lasix  on hold  BPH on Proscar  and Flomax .  Chronic kidney disease stage III creatinine at around baseline (1.5).  Macrocytic anemia  Labs c/w iron def, AOCD  GERD on PPI.    DVT prophylaxis: eliquis  Code Status: full Family Communication: wife at bedside 7/13 Disposition:   Status is: Observation The patient remains OBS appropriate and will d/c before 2 midnights.   Consultants:  none  Procedures:  US  Summary:  RIGHT:  - No evidence of common femoral vein obstruction.         LEFT:      - There is no evidence of deep vein thrombosis in the lower extremity.  However, portions of this examination were limited- see technologist  comments above.    - No cystic structure found in the popliteal fossa.   Antimicrobials:  Anti-infectives (From admission, onward)    Start     Dose/Rate Route Frequency Ordered Stop   03/05/24 2200  metroNIDAZOLE  (FLAGYL ) tablet 500 mg        500 mg Oral Every 12 hours 03/05/24 1305     03/05/24 1500  cefTRIAXone  (ROCEPHIN ) 2 g in sodium chloride  0.9 % 100 mL IVPB        2 g 200 mL/hr over 30 Minutes Intravenous Every 24 hours 03/04/24 2106     03/05/24 0600  metroNIDAZOLE  (FLAGYL ) IVPB 500 mg  Status:  Discontinued        500 mg 100 mL/hr over 60 Minutes Intravenous Every 12 hours 03/04/24 2106 03/05/24 1305   03/04/24  1830  metroNIDAZOLE  (FLAGYL ) IVPB 500 mg        500 mg 100 mL/hr over 60 Minutes Intravenous  Once 03/04/24 1824 03/04/24 1953   03/04/24 1530  cefTRIAXone  (ROCEPHIN ) 2 g in sodium chloride  0.9 % 100 mL IVPB        2 g 200 mL/hr over 30 Minutes Intravenous Once 03/04/24 1525 03/04/24 1830       Subjective: Improved pain  Objective: Vitals:   03/06/24 0753 03/06/24 1537 03/06/24 2200 03/07/24 1026  BP: 128/71 (!) 130/56 128/60 (!) 138/58  Pulse: 70 72 72 74   Resp: 15 17 18 16   Temp: 98.6 F (37 C) 98.3 F (36.8 C) 98.5 F (36.9 C) 97.9 F (36.6 C)  TempSrc: Oral Oral Oral Oral  SpO2: 95% 97% 98% 97%  Weight:      Height:        Intake/Output Summary (Last 24 hours) at 03/07/2024 1543 Last data filed at 03/06/2024 1651 Gross per 24 hour  Intake 240 ml  Output --  Net 240 ml   Filed Weights   03/04/24 1032 03/04/24 1258  Weight: 82 kg 83.5 kg    Examination:  General: No acute distress. Cardiovascular: RRR Lungs: unlabored Neurological: Alert and oriented 3. Moves all extremities 4 with equal strength. Cranial nerves II through XII grossly intact. Extremities: improved tenderness to LLE, continued redness/swelling    Data Reviewed: I have personally reviewed following labs and imaging studies  CBC: Recent Labs  Lab 03/04/24 1046 03/05/24 0656 03/06/24 0831 03/07/24 0642  WBC 13.5* 10.3 6.6 6.7  NEUTROABS 11.4*  --  4.8 4.8  HGB 11.5* 10.1* 9.7* 9.8*  HCT 35.8* 31.2* 30.1* 30.2*  MCV 106.5* 108.0* 108.3* 106.3*  PLT 186 144* 128* 149*    Basic Metabolic Panel: Recent Labs  Lab 03/04/24 1046 03/05/24 0656 03/06/24 0831 03/07/24 0642  NA 143 141 137 138  K 4.0 3.9 3.9 3.9  CL 104 105 104 103  CO2 27 26 22 24   GLUCOSE 136* 110* 127* 127*  BUN 27* 25* 25* 25*  CREATININE 1.47* 1.39* 1.53* 1.63*  CALCIUM 9.0 8.5* 8.4* 8.3*  MG  --   --  1.9  --   PHOS  --   --  3.1  --     GFR: Estimated Creatinine Clearance: 34 mL/min ([name]) (by C-G formula based on SCr of 1.63 mg/dL (H)).  Liver Function Tests: Recent Labs  Lab 03/04/24 1046 03/06/24 0831 03/07/24 0642  AST 30 39 33  ALT 17 15 18   ALKPHOS 50 44 46  BILITOT 1.5* 0.7 0.7  PROT 5.7* 4.9* 5.1*  ALBUMIN 3.5 2.6* 2.6*    CBG: No results for input(s): GLUCAP in the last 168 hours.   Recent Results (from the past 240 hours)  Blood Culture (routine x 2)     Status: None (Preliminary result)   Collection Time: 03/04/24 10:46 AM   Specimen:  BLOOD  Result Value Ref Range Status   Specimen Description BLOOD SITE NOT SPECIFIED  Final   Special Requests   Final    BOTTLES DRAWN AEROBIC AND ANAEROBIC Blood Culture results may not be optimal due to an inadequate volume of blood received in culture bottles   Culture   Final    NO GROWTH 3 DAYS Performed at [facility], [address] [place], [place] [address]    Report Status PENDING  Incomplete  Blood Culture (routine x 2)     Status:  None (Preliminary result)   Collection Time: 03/04/24 10:51 AM   Specimen: BLOOD  Result Value Ref Range Status   Specimen Description BLOOD SITE NOT SPECIFIED  Final   Special Requests   Final    BOTTLES DRAWN AEROBIC AND ANAEROBIC Blood Culture results may not be optimal due to an inadequate volume of blood received in culture bottles   Culture   Final    NO GROWTH 3 DAYS Performed at [facility], [address] [place], [place] [address]    Report Status PENDING  Incomplete  Resp panel by RT-PCR (RSV, Flu Leannah Guse&B, Covid) Anterior Nasal Swab     Status: None   Collection Time: 03/04/24  3:23 PM   Specimen: Anterior Nasal Swab  Result Value Ref Range Status   SARS Coronavirus 2 by RT PCR NEGATIVE NEGATIVE Final   Influenza Lewin Pellow by PCR NEGATIVE NEGATIVE Final   Influenza B by PCR NEGATIVE NEGATIVE Final    Comment: (NOTE) The Xpert Xpress SARS-CoV-2/FLU/RSV plus assay is intended as an aid in the diagnosis of influenza from Nasopharyngeal swab specimens and should not be used as Geoffrey Hynes sole basis for treatment. Nasal washings and aspirates are unacceptable for Xpert Xpress SARS-CoV-2/FLU/RSV testing.  Fact Sheet for Patients: BloggerCourse.com  Fact Sheet for Healthcare Providers: SeriousBroker.it  This test is not yet approved or cleared by the [place]  FDA and has been authorized for detection and/or diagnosis of SARS-CoV-2 by FDA under an Emergency Use Authorization (EUA).  This EUA will remain in effect (meaning this test can be used) for the duration of the COVID-19 declaration under Section 564(b)(1) of the Act, 21 U.S.C. section 360bbb-3(b)(1), unless the authorization is terminated or revoked.     Resp Syncytial Virus by PCR NEGATIVE NEGATIVE Final    Comment: (NOTE) Fact Sheet for Patients: BloggerCourse.com  Fact Sheet for Healthcare Providers: SeriousBroker.it  This test is not yet approved or cleared by the [place]  FDA and has been authorized for detection and/or diagnosis of SARS-CoV-2 by FDA under an Emergency Use Authorization (EUA). This EUA will remain in effect (meaning this test can be used) for the duration of the COVID-19 declaration under Section 564(b)(1) of the Act, 21 U.S.C. section 360bbb-3(b)(1), unless the authorization is terminated or revoked.  Performed at [facility], [address] [place], [place] [address]          Radiology Studies: No results found.       Scheduled Meds:  acetaminophen   1,000 mg Oral Q8H   amLODipine   2.5 mg Oral Daily   apixaban   2.5 mg Oral BID   cyanocobalamin   500 mcg Oral Daily   finasteride   5 mg Oral Daily   fluticasone  furoate-vilanterol  1 puff Inhalation Daily   gabapentin   100 mg Oral BID   metoprolol  succinate  50 mg Oral Daily   metroNIDAZOLE   500 mg Oral Q12H   pantoprazole   20 mg Oral Daily   pravastatin   20 mg Oral q1800   tamsulosin   0.4 mg Oral BID   Continuous Infusions:  cefTRIAXone  (ROCEPHIN )  IV 2 g (03/06/24 1524)     LOS: 2 days    Time spent: over 30 min    Meliton Monte, MD [facility]   To contact the attending provider between 7A-7P or the covering provider during after hours 7P-7A, please log into the web site [email or web address] and access using universal [place] password for that web site. If you do not have the  password, please call the hospital  operator.  03/07/2024, 3:43 PM

## 2024-03-07 NOTE — Progress Notes (Signed)
 Mobility Specialist: Progress Note   03/07/24 1559  Mobility  Activity Ambulated with assistance in hallway  Level of Assistance Contact guard assist, steadying assist (+ chair follow)  Assistive Device Front wheel walker  Distance Ambulated (ft) 150 ft  Activity Response Tolerated well  Mobility Referral Yes  Mobility visit 1 Mobility  Mobility Specialist Start Time (ACUTE ONLY) 1426  Mobility Specialist Stop Time (ACUTE ONLY) 1444  Mobility Specialist Time Calculation (min) (ACUTE ONLY) 18 min    Pt received in bed, agreeable to mobility session. C/o L foot pain from edema and redness. SV for bed mobility with extra effort. Heavy minA for STS from EOB. CGA for ambulation with +2 for chair follow from wife. Took 1x seated break d/t fatigue and SOB, SpO2 96-97% on RA. Light minA for STS from chair. Returned to room without fault. Left in chair with all needs met, LLE elevated. Call bell in reach, wife present.   Ileana Lute Mobility Specialist Please contact via SecureChat or Rehab office at [phone]

## 2024-03-07 NOTE — Progress Notes (Signed)
 Presents with diverticulitis.   03/07/24 1506  TOC Brief Assessment  Insurance and Status Reviewed  Patient has primary care physician Yes  Home environment has been reviewed From home with wife.  Prior level of function: PTA independent with ADL's.  Prior/Current Home Services No current home services  Social Drivers of Health Review SDOH reviewed no interventions necessary  Readmission risk has been reviewed No  Transition of care needs no transition of care needs at this time   TOC following and will assist with needs as they presents. Jon Hoit RN,BSN,CM

## 2024-03-07 NOTE — Evaluation (Signed)
 Physical Therapy Evaluation Patient Details Name: [name] MRN: [id number] DOB: December 07, 1931 Today's Date: 03/07/2024  History of Present Illness  89 y.o. male presents to [facility] hospital on 03/04/2024 with multiple episodes of nausea and vomiting as well as LE edema. Pt admitted for management of LLE cellulitis. PMH includes afib, HTH, CKD III, chronic anemia, GERD.  Clinical Impression  Prior to admittance, pt was mobilizing w/ RW and closeby supervision. Pt presents to evaluation with deficits in strength, power, mobility, balance, activity tolerance, and cognition, all limiting pt's ability to mobilize near baseline. Pt was able to ambulate w/ AD and no physical assistance given. Pt requires occasional cueing for sequencing and walker management. Pt would benefit from further transfer and gait training. PT will continue to treat pt while he is admitted. Recommending HHPT at discharge to address remaining mobility deficits and optimize return to PLOF.         If plan is discharge home, recommend the following: A little help with walking and/or transfers;A little help with bathing/dressing/bathroom;Direct supervision/assist for medications management;Direct supervision/assist for financial management;Assist for transportation;Help with stairs or ramp for entrance   Can travel by private vehicle        Equipment Recommendations None recommended by PT  Recommendations for Other Services       Functional Status Assessment Patient has had a recent decline in their functional status and demonstrates the ability to make significant improvements in function in a reasonable and predictable amount of time.     Precautions / Restrictions Precautions Precautions: Fall Recall of Precautions/Restrictions: Intact Restrictions Weight Bearing Restrictions Per Provider Order: No      Mobility  Bed Mobility Overal bed mobility: Needs Assistance Bed Mobility: Sit to Supine       Sit to  supine: Supervision, HOB elevated, Used rails   General bed mobility comments: Pt able to pull self up towards HOB using railing. VC given for sequencing; increased time to complete.    Transfers Overall transfer level: Needs assistance Equipment used: Rolling walker (2 wheels) Transfers: Sit to/from Stand Sit to Stand: Contact guard assist           General transfer comment: STS from recliner w/ RW, pushing up from chair w/ BUE. VC given for anterior trunk lean, and for sequencing. Increased time and effort to complete.    Ambulation/Gait Ambulation/Gait assistance: Contact guard assist Gait Distance (Feet): 120 Feet Assistive device: Rolling walker (2 wheels) Gait Pattern/deviations: Step-through pattern, Decreased stride length, Drifts right/left, Trunk flexed Gait velocity: reduced Gait velocity interpretation: <1.8 ft/sec, indicate of risk for recurrent falls   General Gait Details: Pt ambulates w/ reciprocal gait pattern and increased reliance on RW for stability. Pt demonstrates drifting L/R throughout and is able to self-correct. VC given for occasional walker management.  Stairs            Wheelchair Mobility     Tilt Bed    Modified Rankin (Stroke Patients Only)       Balance Overall balance assessment: Needs assistance Sitting-balance support: No upper extremity supported, Feet supported Sitting balance-Leahy Scale: Fair Sitting balance - Comments: seated EOB   Standing balance support: Bilateral upper extremity supported, During functional activity, Reliant on assistive device for balance Standing balance-Leahy Scale: Poor Standing balance comment: reliant on external support                             Pertinent Vitals/Pain Pain Assessment Pain  Assessment: No/denies pain    Home Living Family/patient expects to be discharged to:: Private residence Living Arrangements: Spouse/significant other Available Help at Discharge:  Family;Available 24 hours/day (aid comes in 9-1 M-F) Type of Home: House Home Access: Stairs to enter;Other (comment) (own a lift for entrance) Entrance Stairs-Rails: Right Entrance Stairs-Number of Steps: 5   Home Layout: One level Home Equipment: Educational psychologist (4 wheels);Cane - single point;Wheelchair - Warden/ranger (2 wheels) Additional Comments: spouse reports several falls within the past year but is unable to recall exact number    Prior Function Prior Level of Function : Needs assist       Physical Assist : Mobility (physical);ADLs (physical) Mobility (physical):  (supervision for all mobility) ADLs (physical):  (supervision for all ADLs) Mobility Comments: supervision w/ RW ADLs Comments: supervision     Extremity/Trunk Assessment   Upper Extremity Assessment Upper Extremity Assessment: Generalized weakness    Lower Extremity Assessment Lower Extremity Assessment: Generalized weakness    Cervical / Trunk Assessment Cervical / Trunk Assessment: Kyphotic  Communication   Communication Communication: Impaired Factors Affecting Communication: Hearing impaired    Cognition Arousal: Alert Behavior During Therapy: WFL for tasks assessed/performed   PT - Cognitive impairments: History of cognitive impairments, Memory, Problem solving, Sequencing                         Following commands: Intact       Cueing Cueing Techniques: Verbal cues, Visual cues     General Comments General comments (skin integrity, edema, etc.): no signs of acute distress    Exercises     Assessment/Plan    PT Assessment Patient needs continued PT services  PT Problem List Decreased strength;Decreased activity tolerance;Decreased balance;Decreased mobility;Decreased coordination;Decreased cognition;Decreased knowledge of use of DME       PT Treatment Interventions DME instruction;Gait training;Stair training;Functional mobility  training;Therapeutic activities;Therapeutic exercise;Balance training;Cognitive remediation;Patient/family education;Wheelchair mobility training;Manual techniques;Modalities    PT Goals (Current goals can be found in the Care Plan section)  Acute Rehab PT Goals Patient Stated Goal: to go home PT Goal Formulation: With patient/family Time For Goal Achievement: 03/21/24 Potential to Achieve Goals: Good    Frequency Min 2X/week     Co-evaluation               AM-PAC PT 6 Clicks Mobility  Outcome Measure Help needed turning from your back to your side while in a flat bed without using bedrails?: A Little Help needed moving from lying on your back to sitting on the side of a flat bed without using bedrails?: A Little Help needed moving to and from a bed to a chair (including a wheelchair)?: A Little Help needed standing up from a chair using your arms (e.g., wheelchair or bedside chair)?: A Little Help needed to walk in hospital room?: A Little Help needed climbing 3-5 steps with a railing? : A Little 6 Click Score: 18    End of Session Equipment Utilized During Treatment: Gait belt Activity Tolerance: Patient tolerated treatment well Patient left: in bed;with call bell/phone within reach;with bed alarm set;with family/visitor present Nurse Communication: Mobility status PT Visit Diagnosis: Other abnormalities of gait and mobility (R26.89);History of falling (Z91.81);Muscle weakness (generalized) (M62.81);Unsteadiness on feet (R26.81)    Time: 8292-8274 PT Time Calculation (min) (ACUTE ONLY): 18 min   Charges:   PT Evaluation $PT Eval Low Complexity: 1 Low   PT General Charges $$ ACUTE PT VISIT: 1 Visit  [facility], [place] Acute Rehab [phone]   Leontine Hilt 03/07/2024, 5:40 PM

## 2024-03-08 ENCOUNTER — Inpatient Hospital Stay (HOSPITAL_COMMUNITY)

## 2024-03-08 DIAGNOSIS — K5792 Diverticulitis of intestine, part unspecified, without perforation or abscess without bleeding: Secondary | ICD-10-CM | POA: Diagnosis not present

## 2024-03-08 LAB — CBC
HCT: 33.4 % — ABNORMAL LOW (ref 39.0–52.0)
Hemoglobin: 10.9 g/dL — ABNORMAL LOW (ref 13.0–17.0)
MCH: 34.4 pg — ABNORMAL HIGH (ref 26.0–34.0)
MCHC: 32.6 g/dL (ref 30.0–36.0)
MCV: 105.4 fL — ABNORMAL HIGH (ref 80.0–100.0)
Platelets: 189 K/uL (ref 150–400)
RBC: 3.17 MIL/uL — ABNORMAL LOW (ref 4.22–5.81)
RDW: 16.5 % — ABNORMAL HIGH (ref 11.5–15.5)
WBC: 5.6 K/uL (ref 4.0–10.5)
nRBC: 0 % (ref 0.0–0.2)

## 2024-03-08 LAB — BASIC METABOLIC PANEL WITH GFR
Anion gap: 12 (ref 5–15)
BUN: 21 mg/dL (ref 8–23)
CO2: 25 mmol/L (ref 22–32)
Calcium: 8.9 mg/dL (ref 8.9–10.3)
Chloride: 103 mmol/L (ref 98–111)
Creatinine, Ser: 1.32 mg/dL — ABNORMAL HIGH (ref 0.61–1.24)
GFR, Estimated: 51 mL/min — ABNORMAL LOW (ref >60)
Glucose, Bld: 99 mg/dL (ref 70–99)
Potassium: 4 mmol/L (ref 3.5–5.1)
Sodium: 140 mmol/L (ref 135–145)

## 2024-03-08 LAB — MAGNESIUM: Magnesium: 2 mg/dL (ref 1.7–2.4)

## 2024-03-08 LAB — PHOSPHORUS: Phosphorus: 3.2 mg/dL (ref 2.5–4.6)

## 2024-03-08 LAB — C-REACTIVE PROTEIN: CRP: 7.9 mg/dL — ABNORMAL HIGH (ref <1.0)

## 2024-03-08 MED ORDER — CEFAZOLIN SODIUM-DEXTROSE 2-4 GM/100ML-% IV SOLN
2.0000 g | Freq: Three times a day (TID) | INTRAVENOUS | Status: DC
  Administered 2024-03-08 – 2024-03-10 (×6): 2 g via INTRAVENOUS
  Filled 2024-03-08 (×6): qty 100

## 2024-03-08 MED ORDER — IOHEXOL 350 MG/ML SOLN
75.0000 mL | Freq: Once | INTRAVENOUS | Status: AC | PRN
  Administered 2024-03-08: 75 mL via INTRAVENOUS

## 2024-03-08 MED ORDER — LACTATED RINGERS IV SOLN: INTRAVENOUS | Status: DC

## 2024-03-08 NOTE — Plan of Care (Signed)
   Problem: Activity: Goal: Risk for activity intolerance will decrease Outcome: Progressing   Problem: Coping: Goal: Level of anxiety will decrease Outcome: Progressing   Problem: Pain Managment: Goal: General experience of comfort will improve and/or be controlled Outcome: Progressing

## 2024-03-08 NOTE — Evaluation (Signed)
 Occupational Therapy Evaluation/Discharge Patient Details Name: [name] MRN: [id number] DOB: Jul 22, 1932 Today's Date: 03/08/2024   History of Present Illness   89 y.o. male presents to [facility] hospital on 03/04/2024 with multiple episodes of nausea and vomiting as well as LE edema. Pt admitted for management of LLE cellulitis. PMH includes afib, HTH, CKD III, chronic anemia, GERD.     Clinical Impressions Patient evaluated by Occupational Therapy with no further acute OT needs identified. All education has been completed and the patient has no further questions. At baseline, pt is able to complete functional transfers and BADL tasks at [facility] with use of appropriate AD/DME. Pt receives assist from spouse or personal care attendant as needed. Pt is either at or close to baseline during OT evaluation. Recommending follow up Home Health OT services at discharge. No additional DME recommendations at this time. OT is signing off. Thank you for this referral.      If plan is discharge home, recommend the following:   A little help with walking and/or transfers;A little help with bathing/dressing/bathroom;Assist for transportation     Functional Status Assessment   Patient has had a recent decline in their functional status and demonstrates the ability to make significant improvements in function in a reasonable and predictable amount of time.     Equipment Recommendations   None recommended by OT      Precautions/Restrictions   Precautions Precautions: Fall Recall of Precautions/Restrictions: Impaired Precaution/Restrictions Comments: history of cognitive impairment Restrictions Weight Bearing Restrictions Per Provider Order: No     Mobility Bed Mobility Overal bed mobility: Needs Assistance Bed Mobility: Supine to Sit     Supine to sit: Supervision     General bed mobility comments: VC to initiate task only. No physical assist required.    Transfers Overall  transfer level: Needs assistance Equipment used: Rolling walker (2 wheels) Transfers: Sit to/from Stand, Bed to chair/wheelchair/BSC Sit to Stand: Supervision, From elevated surface     Step pivot transfers: Supervision     General transfer comment: Required VC multiple times for hand placement during RW management prior to sit<>stand transition. Wife reports that pt normally has a difficult time with power up into standing.      Balance Overall balance assessment: Needs assistance, History of Falls Sitting-balance support: No upper extremity supported, Feet supported Sitting balance-Leahy Scale: Fair     Standing balance support: Bilateral upper extremity supported, During functional activity, Reliant on assistive device for balance Standing balance-Leahy Scale: Poor        ADL either performed or assessed with clinical judgement   ADL           Upper Body Bathing: Set up;Sitting   Lower Body Bathing: Minimal assistance;Sit to/from stand   Upper Body Dressing : Set up;Sitting   Lower Body Dressing: Minimal assistance;Bed level   Toilet Transfer: Supervision/safety;BSC/3in1;Stand-pivot;Cueing for sequencing;Rolling walker (2 wheels)   Toileting- Clothing Manipulation and Hygiene: Minimal assistance;Sit to/from stand               Vision Baseline Vision/History: 1 Wears glasses Ability to See in Adequate Light: 1 Impaired Patient Visual Report: No change from baseline Vision Assessment?: Wears glasses for reading     Perception Perception: Not tested       Praxis Praxis: Not tested       Pertinent Vitals/Pain Pain Assessment Pain Assessment: Faces Faces Pain Scale: Hurts a little bit Pain Location: L foot/lower leg Pain Descriptors / Indicators: Aching, Grimacing Pain Intervention(s): Monitored  during session     Extremity/Trunk Assessment Upper Extremity Assessment Upper Extremity Assessment: Overall WFL for tasks assessed (age appropriate  strength noted during functional tasks)   Lower Extremity Assessment Lower Extremity Assessment: Defer to PT evaluation   Cervical / Trunk Assessment Cervical / Trunk Assessment: Kyphotic   Communication Communication Communication: Impaired Factors Affecting Communication: Hearing impaired   Cognition Arousal: Alert Behavior During Therapy: WFL for tasks assessed/performed Cognition: History of cognitive impairments    Following commands: Intact       Cueing  General Comments   Cueing Techniques: Verbal cues;Visual cues  Redness and increased edema present with left foot/ankle region.           Home Living Family/patient expects to be discharged to:: Private residence Living Arrangements: Spouse/significant other Available Help at Discharge: Family;Available 24 hours/day;Personal care attendant (PCA M-F 9-1pm) Type of Home: House Home Access: Stairs to enter;Other (comment) (lift chair at entrance) Entrance Stairs-Number of Steps: 5 Entrance Stairs-Rails: Right Home Layout: One level     Bathroom Shower/Tub: Producer, television/film/video: Standard Bathroom Accessibility: Yes   Home Equipment: Educational psychologist (4 wheels);Cane - single point;Wheelchair - Warden/ranger (2 wheels)          Prior Functioning/Environment Prior Level of Function : Needs assist      Mobility Comments: supervision w/ RW ADLs Comments: Primarily required SBA for BADL tasks and cognition    OT Problem List: Impaired balance (sitting and/or standing);Decreased knowledge of use of DME or AE;Decreased safety awareness;Decreased activity tolerance        OT Goals(Current goals can be found in the care plan section)   Acute Rehab OT Goals Patient Stated Goal: to sit up in the recliner OT Goal Formulation: All assessment and education complete, DC therapy   OT Frequency:   1X visit       AM-PAC OT 6 Clicks Daily Activity     Outcome Measure  Help from another person eating meals?: None Help from another person taking care of personal grooming?: None Help from another person toileting, which includes using toliet, bedpan, or urinal?: A Little Help from another person bathing (including washing, rinsing, drying)?: A Little Help from another person to put on and taking off regular upper body clothing?: None Help from another person to put on and taking off regular lower body clothing?: A Little 6 Click Score: 21   End of Session Equipment Utilized During Treatment: Rolling walker (2 wheels)  Activity Tolerance: Patient tolerated treatment well Patient left: in chair;with call bell/phone within reach;with chair alarm set;with family/visitor present  OT Visit Diagnosis: Unsteadiness on feet (R26.81);History of falling (Z91.81);Muscle weakness (generalized) (M62.81)                Time: 9084-9072 OT Time Calculation (min): 12 min Charges:  OT General Charges $OT Visit: 1 Visit OT Evaluation $OT Eval Low Complexity: 1 Low  Leita Howell, OTR/L,CBIS  Supplemental OT - [facility] and [facility] Secure Chat Preferred    Bryella Diviney, Leita BIRCH 03/08/2024, 10:43 AM

## 2024-03-08 NOTE — Progress Notes (Signed)
 PROGRESS NOTE    [name]  FMW:[id number] DOB: 09/20/1931 DOA: 03/04/2024 PCP: [name], MD  Chief Complaint  Patient presents with   Fever    Brief Narrative:   [name] is [name] 89 y.o. male with history of John Williamsen-fib, hypertension, chronic kidney disease stage III, chronic anemia, GERD admitted in October 2024 for MSSA bacteremia in the setting of pneumonia started experiencing fever and chills this morning following which patient had multiple episodes of nausea vomiting with some abdominal discomfort.  Patient also has been having intermittently dysuria for few weeks.  Denies any chest pain productive cough.  Patient recently has been using increased dose of diuresis due to lower extremity edema.   He's been admitted for L leg cellulitis and possible diverticulitis.  Assessment & Plan:   Principal Problem:   Diverticulitis Active Problems:   Essential hypertension   ATRIAL FIBRILLATION   CKD (chronic kidney disease) stage 3, GFR 30-59 ml/min (HCC)   Hyperlipidemia   GERD (gastroesophageal reflux disease)   BPH (benign prostatic hyperplasia)   Macrocytic anemia   Chronic edema   Cellulitis  Sepsis due Left Lower Extremity Cellulitis Rules in with leukocytosis, tachycardia, tachypnea - cellulitis (and diverticulitis below) Redness, tenderness to L leg - ROM of ankle seems equal when compared to L, low suspicion for joint involvement.  No fluctuance or crepitus.  This limits his ability to walk.  Ceftriaxone  7/11-7/14.  Ancef  7/15 - .  Blood cultures NGx4  Follow inflammatory markers (CRP 16.9 -> 11)  Will get CT tib/fib and CT foot with contrast given more swelling and discomfort today Elevated lower extremity PT/OT  Acute diverticulitis This may be incidental, but per EDP note, he did have N/V, abdominal discomfort No abdominal TTP today Ceftriaxone /flagy 7/11-15.  Will d/c abx as he's been relatively asymptomatic.  I believe issues above is his primary  issue.   Merwyn Hodapp-fib presently in sinus rhythm takes Eliquis  and metoprolol .  Hypertension on amlodipine  and metoprolol .  Hyperlipidemia on statins.  Chronic bilateral lower extremity edema  Lasix  on hold  BPH on Proscar  and Flomax .  Chronic kidney disease stage III creatinine at around baseline (1.5).  Macrocytic anemia  Labs c/w iron def, AOCD  GERD on PPI.    DVT prophylaxis: eliquis  Code Status: full Family Communication: wife at bedside 7/15 Disposition:   Status is: Observation The patient remains OBS appropriate and will d/c before 2 midnights.   Consultants:  none  Procedures:  US  Summary:  RIGHT:  - No evidence of common femoral vein obstruction.         LEFT:      - There is no evidence of deep vein thrombosis in the lower extremity.  However, portions of this examination were limited- see technologist  comments above.    - No cystic structure found in the popliteal fossa.   Antimicrobials:  Anti-infectives (From admission, onward)    Start     Dose/Rate Route Frequency Ordered Stop   03/08/24 1500  ceFAZolin  (ANCEF ) IVPB 2g/100 mL premix        2 g 200 mL/hr over 30 Minutes Intravenous Every 8 hours 03/08/24 1412 03/15/24 1359   03/05/24 2200  metroNIDAZOLE  (FLAGYL ) tablet 500 mg  Status:  Discontinued        500 mg Oral Every 12 hours 03/05/24 1305 03/08/24 1412   03/05/24 1500  cefTRIAXone  (ROCEPHIN ) 2 g in sodium chloride  0.9 % 100 mL IVPB  Status:  Discontinued  2 g 200 mL/hr over 30 Minutes Intravenous Every 24 hours 03/04/24 2106 03/08/24 1412   03/05/24 0600  metroNIDAZOLE  (FLAGYL ) IVPB 500 mg  Status:  Discontinued        500 mg 100 mL/hr over 60 Minutes Intravenous Every 12 hours 03/04/24 2106 03/05/24 1305   03/04/24 1830  metroNIDAZOLE  (FLAGYL ) IVPB 500 mg        500 mg 100 mL/hr over 60 Minutes Intravenous  Once 03/04/24 1824 03/04/24 1953   03/04/24 1530  cefTRIAXone  (ROCEPHIN ) 2 g in sodium chloride  0.9 % 100 mL IVPB         2 g 200 mL/hr over 30 Minutes Intravenous Once 03/04/24 1525 03/04/24 1830       Subjective: Worse pain and swelling today  Objective: Vitals:   03/07/24 1943 03/08/24 0554 03/08/24 0817 03/08/24 0843  BP: (!) 144/59 (!) 136/98  (!) 134/50  Pulse: 69 75  76  Resp: 16 16  16   Temp: 98.1 F (36.7 C) 97.6 F (36.4 C)  97.9 F (36.6 C)  TempSrc: Oral Oral  Oral  SpO2: 98% 96% 96% 96%  Weight:      Height:        Intake/Output Summary (Last 24 hours) at 03/08/2024 1511 Last data filed at 03/08/2024 0858 Gross per 24 hour  Intake 360 ml  Output --  Net 360 ml   Filed Weights   03/04/24 1032 03/04/24 1258  Weight: 82 kg 83.5 kg    Examination:  General: No acute distress. Cardiovascular: RRR Lungs: unlabored Neurological: Alert and oriented 3. Moves all extremities 4 with equal strength. Cranial nerves II through XII grossly intact. Extremities: more swelling today, seems more TTP to LLE as well, erythema has spread to foot.  No crepitus, no fluctuance.    Data Reviewed: I have personally reviewed following labs and imaging studies  CBC: Recent Labs  Lab 03/04/24 1046 03/05/24 0656 03/06/24 0831 03/07/24 0642 03/08/24 0615  WBC 13.5* 10.3 6.6 6.7 5.6  NEUTROABS 11.4*  --  4.8 4.8  --   HGB 11.5* 10.1* 9.7* 9.8* 10.9*  HCT 35.8* 31.2* 30.1* 30.2* 33.4*  MCV 106.5* 108.0* 108.3* 106.3* 105.4*  PLT 186 144* 128* 149* 189    Basic Metabolic Panel: Recent Labs  Lab 03/04/24 1046 03/05/24 0656 03/06/24 0831 03/07/24 0642 03/08/24 0615  NA 143 141 137 138 140  K 4.0 3.9 3.9 3.9 4.0  CL 104 105 104 103 103  CO2 27 26 22 24 25   GLUCOSE 136* 110* 127* 127* 99  BUN 27* 25* 25* 25* 21  CREATININE 1.47* 1.39* 1.53* 1.63* 1.32*  CALCIUM 9.0 8.5* 8.4* 8.3* 8.9  MG  --   --  1.9  --  2.0  PHOS  --   --  3.1  --  3.2    GFR: Estimated Creatinine Clearance: 42 mL/min (Perri Lamagna) (by C-G formula based on SCr of 1.32 mg/dL (H)).  Liver Function Tests: Recent  Labs  Lab 03/04/24 1046 03/06/24 0831 03/07/24 0642  AST 30 39 33  ALT 17 15 18   ALKPHOS 50 44 46  BILITOT 1.5* 0.7 0.7  PROT 5.7* 4.9* 5.1*  ALBUMIN 3.5 2.6* 2.6*    CBG: No results for input(s): GLUCAP in the last 168 hours.   Recent Results (from the past 240 hours)  Blood Culture (routine x 2)     Status: None (Preliminary result)   Collection Time: 03/04/24 10:46 AM   Specimen: BLOOD  Result Value  Ref Range Status   Specimen Description BLOOD SITE NOT SPECIFIED  Final   Special Requests   Final    BOTTLES DRAWN AEROBIC AND ANAEROBIC Blood Culture results may not be optimal due to an inadequate volume of blood received in culture bottles   Culture   Final    NO GROWTH 4 DAYS Performed at [facility], [address] [place], [place] [address]    Report Status PENDING  Incomplete  Blood Culture (routine x 2)     Status: None (Preliminary result)   Collection Time: 03/04/24 10:51 AM   Specimen: BLOOD  Result Value Ref Range Status   Specimen Description BLOOD SITE NOT SPECIFIED  Final   Special Requests   Final    BOTTLES DRAWN AEROBIC AND ANAEROBIC Blood Culture results may not be optimal due to an inadequate volume of blood received in culture bottles   Culture   Final    NO GROWTH 4 DAYS Performed at [facility], [address] [place], [place] [address]    Report Status PENDING  Incomplete  Resp panel by RT-PCR (RSV, Flu Tammy Ericsson&B, Covid) Anterior Nasal Swab     Status: None   Collection Time: 03/04/24  3:23 PM   Specimen: Anterior Nasal Swab  Result Value Ref Range Status   SARS Coronavirus 2 by RT PCR NEGATIVE NEGATIVE Final   Influenza Joson Sapp by PCR NEGATIVE NEGATIVE Final   Influenza B by PCR NEGATIVE NEGATIVE Final    Comment: (NOTE) The Xpert Xpress SARS-CoV-2/FLU/RSV plus assay is intended as an aid in the diagnosis of influenza from Nasopharyngeal swab specimens and should not be used as Trishelle Devora sole basis for treatment. Nasal washings and aspirates  are unacceptable for Xpert Xpress SARS-CoV-2/FLU/RSV testing.  Fact Sheet for Patients: BloggerCourse.com  Fact Sheet for Healthcare Providers: SeriousBroker.it  This test is not yet approved or cleared by the [place]  FDA and has been authorized for detection and/or diagnosis of SARS-CoV-2 by FDA under an Emergency Use Authorization (EUA). This EUA will remain in effect (meaning this test can be used) for the duration of the COVID-19 declaration under Section 564(b)(1) of the Act, 21 U.S.C. section 360bbb-3(b)(1), unless the authorization is terminated or revoked.     Resp Syncytial Virus by PCR NEGATIVE NEGATIVE Final    Comment: (NOTE) Fact Sheet for Patients: BloggerCourse.com  Fact Sheet for Healthcare Providers: SeriousBroker.it  This test is not yet approved or cleared by the [place]  FDA and has been authorized for detection and/or diagnosis of SARS-CoV-2 by FDA under an Emergency Use Authorization (EUA). This EUA will remain in effect (meaning this test can be used) for the duration of the COVID-19 declaration under Section 564(b)(1) of the Act, 21 U.S.C. section 360bbb-3(b)(1), unless the authorization is terminated or revoked.  Performed at [facility], [address], [place], [place] [address]          Radiology Studies: No results found.       Scheduled Meds:  acetaminophen   1,000 mg Oral Q8H   amLODipine   2.5 mg Oral Daily   apixaban   2.5 mg Oral BID   cyanocobalamin   500 mcg Oral Daily   finasteride   5 mg Oral Daily   fluticasone  furoate-vilanterol  1 puff Inhalation Daily   gabapentin   100 mg Oral BID   metoprolol  succinate  50 mg Oral Daily   pantoprazole   20 mg Oral Daily   pravastatin   20 mg Oral q1800  tamsulosin   0.4 mg Oral BID   Continuous Infusions:   ceFAZolin  (ANCEF ) IV     lactated ringers        LOS: 3 days     Time spent: over 30 min    Meliton Monte, MD [facility]   To contact the attending provider between 7A-7P or the covering provider during after hours 7P-7A, please log into the web site [email or web address] and access using universal [place] password for that web site. If you do not have the password, please call the hospital operator.  03/08/2024, 3:11 PM

## 2024-03-09 DIAGNOSIS — K5792 Diverticulitis of intestine, part unspecified, without perforation or abscess without bleeding: Secondary | ICD-10-CM | POA: Diagnosis not present

## 2024-03-09 LAB — CULTURE, BLOOD (ROUTINE X 2)
Culture: NO GROWTH
Culture: NO GROWTH

## 2024-03-09 NOTE — Plan of Care (Signed)
  Problem: Education: Goal: Knowledge of General Education information will improve Description: Including pain rating scale, medication(s)/side effects and non-pharmacologic comfort measures Outcome: Progressing   Problem: Health Behavior/Discharge Planning: Goal: Ability to manage health-related needs will improve Outcome: Progressing   Problem: Clinical Measurements: Goal: Ability to maintain clinical measurements within normal limits will improve Outcome: Progressing   Problem: Activity: Goal: Risk for activity intolerance will decrease Outcome: Progressing   Problem: Coping: Goal: Level of anxiety will decrease Outcome: Progressing   Problem: Pain Managment: Goal: General experience of comfort will improve and/or be controlled Outcome: Progressing   Problem: Safety: Goal: Ability to remain free from injury will improve Outcome: Progressing   Problem: Skin Integrity: Goal: Risk for impaired skin integrity will decrease Outcome: Progressing

## 2024-03-09 NOTE — Progress Notes (Signed)
 [facility] PROGRESS NOTE    Progress Note  [name]  FMW:[id number] DOB: 09-13-1931 DOA: 03/04/2024 PCP: [name], MD     Brief Narrative:   [name] is an 89 y.o. male past medical history of chronic atrial fibrillation on Eliquis , chronic kidney disease stage III, GERD, discharged from the hospital in October 2024 for MSSA bacteremia in the setting of pneumonia, comes into the hospital fever chills nausea vomiting and abdominal discomfort CT scan of the abdomen pelvis showed acute diverticulitis he was also found to be septic with a left lower extremity cellulitis   Assessment/Plan:   Sepsis due to left lower extremity cellulitis: Empirically on antibiotics now transition to Ancef  on 03/08/2024. Blood cultures have been negative x 4. CT scan of the tib-fib showed cellulitic changes no fluid collection or foreign body no osteomyelitis or septic arthritis. CT of the left foot shows severe diffuse edema no evidence of deep soft tissue infection abscess or foreign body, no evidence of osteomyelitis or septic arthritis. He has remained afebrile with no leukocytosis. CRP is trending down.  Acute diverticulitis No abdominal pain per previous physician. He was on admission started on Rocephin  and Flagyl  for which he received 4 days of treatment. He now remains asymptomatic.  Chronic atrial fibrillation: In sinus rhythm continue metoprolol  and Eliquis .  Essential hypertension: Continue amlodipine  and metoprolol .  Hyperlipidemia: Continue statins.  Chronic bilateral lower extremity edema: Noted.  BPH: Continue Proscar  and Flomax .  Chronic kidney disease stage IIIa: His creatinine appears to be at baseline.  Macrocytic anemia: Noted no signs of overt bleeding.  GERD: Continue PPI.   DVT prophylaxis: lovenox Family Communication:Wife Status is: Inpatient Remains inpatient appropriate because: Sepsis.  Hypotension is improved    Code  Status:     Code Status Orders  (From admission, onward)           Start     Ordered   03/04/24 2106  Full code  Continuous       Question:  By:  Answer:  Consent: discussion documented in EHR   03/04/24 2106           Code Status History     Date Active Date Inactive Code Status Order ID Comments User Context   05/28/2023 0014 06/02/2023 1544 Full Code [id number]  Shona Terry SAILOR, DO ED   05/26/2023 2326 05/27/2023 1817 Full Code [id number]  Lonzell Emeline HERO, DO ED   04/07/2019 1856 04/08/2019 1921 Full Code [id number]  Dock Bruno HERO., PA-C Inpatient   04/20/2014 1644 04/22/2014 1520 Full Code [id number]  Mavis Purchase, MD Inpatient         IV Access:   Peripheral IV   Procedures and diagnostic studies:   CT FOOT LEFT W CONTRAST Result Date: 03/08/2024 CLINICAL DATA:  Cellulitis. Evaluate for deep infection. Soft tissue infection suspected. EXAM: CT OF THE LOWER LEFT EXTREMITY WITH CONTRAST TECHNIQUE: Multidetector CT imaging of the left foot was performed according to the standard protocol following intravenous contrast administration. RADIATION DOSE REDUCTION: This exam was performed according to the departmental dose-optimization program which includes automated exposure control, adjustment of the mA and/or kV according to patient size and/or use of iterative reconstruction technique. CONTRAST:  75mL OMNIPAQUE  IOHEXOL  350 MG/ML SOLN COMPARISON:  None Available. FINDINGS: Bones/Joint/Cartilage The bones are demineralized. No evidence of acute fracture, dislocation or osteomyelitis. No significant ankle joint effusion. Mild clawtoe deformities of the 2nd through 5th digits with mild interphalangeal degenerative changes. Ligaments Suboptimally assessed  by CT. Muscles and Tendons As evaluated by CT, the ankle and foot tendons appear intact, without significant tenosynovitis. Mild generalized muscular atrophy without focal fluid collection or suspicious enhancement. Soft tissues  Moderate to severe diffuse subcutaneous edema in the distal lower leg with extension into the dorsal aspect of the foot. No evidence of organized fluid collection, foreign body or soft tissue emphysema. IMPRESSION: 1. Moderate to severe diffuse subcutaneous edema in the distal lower leg and dorsal aspect of the foot consistent with cellulitis. No evidence of deep soft tissue infection, abscess, foreign body or soft tissue emphysema. 2. No evidence of osteomyelitis or septic arthritis. 3. Mild clawtoe deformities of the 2nd through 5th digits with mild interphalangeal degenerative changes. Electronically Signed   By: Elsie Perone M.D.   On: 03/08/2024 18:47   CT TIBIA FIBULA LEFT W CONTRAST Result Date: 03/08/2024 CLINICAL DATA:  Cellulitis. Evaluate for deep infection. Soft tissue infection suspected. EXAM: CT OF THE LOWER LEFT EXTREMITY WITH CONTRAST TECHNIQUE: Multidetector CT imaging of the left lower leg was performed according to the standard protocol following intravenous contrast administration. RADIATION DOSE REDUCTION: This exam was performed according to the departmental dose-optimization program which includes automated exposure control, adjustment of the mA and/or kV according to patient size and/or use of iterative reconstruction technique. CONTRAST:  75mL OMNIPAQUE  IOHEXOL  350 MG/ML SOLN COMPARISON:  Left knee radiographs 07/03/2023. Left lower extremity venous Doppler ultrasound 05/29/2023. FINDINGS: Bones/Joint/Cartilage The bones are demineralized. No evidence of acute fracture, dislocation or osteomyelitis. Mild degenerative changes in the medial compartment of the right knee. No significant knee joint effusion. No significant ankle joint effusion. Ligaments Suboptimally assessed by CT. Muscles and Tendons The visualized quadriceps and patellar tendons are intact. The ankle tendons are intact. No focal muscular atrophy or abnormal enhancement identified. Soft tissues Small Baker's cyst.  Subcutaneous edema throughout the mid to distal lower leg compatible with cellulitis. No organized fluid collection, foreign body or soft tissue emphysema. Scattered vascular calcifications without evidence of acute vascular abnormality. IMPRESSION: 1. Subcutaneous edema throughout the mid to distal lower leg compatible with cellulitis. No organized fluid collection, foreign body or soft tissue emphysema. 2. No evidence of osteomyelitis or septic arthritis. 3. No acute osseous findings. 4. Left foot findings dictated separately. Electronically Signed   By: Elsie Perone M.D.   On: 03/08/2024 18:42     Medical Consultants:   None.   Subjective:    [name] Sink no complaints.  Objective:    Vitals:   03/09/24 0427 03/09/24 0429 03/09/24 0832 03/09/24 0913  BP: (!) 137/57 (!) 135/57 139/66   Pulse: 73 75 81 75  Resp: 16 16 20 18   Temp: 98.3 F (36.8 C) 98.1 F (36.7 C) 97.7 F (36.5 C)   TempSrc: Oral Oral Oral   SpO2: 97% 98% 97% 97%  Weight:      Height:       SpO2: 97 %   Intake/Output Summary (Last 24 hours) at 03/09/2024 1207 Last data filed at 03/09/2024 0835 Gross per 24 hour  Intake 300 ml  Output --  Net 300 ml   Filed Weights   03/04/24 1032 03/04/24 1258  Weight: 82 kg 83.5 kg    Exam: General exam: In no acute distress. Respiratory system: Good air movement and clear to auscultation. Cardiovascular system: S1 & S2 heard, RRR. No JVD. Gastrointestinal system: Abdomen is nondistended, soft and nontender.  Extremities: No pedal edema. Skin:  Psychiatry: Judgement and insight appear normal. Mood &  affect appropriate.    Data Reviewed:    Labs: Basic Metabolic Panel: Recent Labs  Lab 03/04/24 1046 03/05/24 0656 03/06/24 0831 03/07/24 0642 03/08/24 0615  NA 143 141 137 138 140  K 4.0 3.9 3.9 3.9 4.0  CL 104 105 104 103 103  CO2 27 26 22 24 25   GLUCOSE 136* 110* 127* 127* 99  BUN 27* 25* 25* 25* 21  CREATININE 1.47* 1.39* 1.53* 1.63*  1.32*  CALCIUM 9.0 8.5* 8.4* 8.3* 8.9  MG  --   --  1.9  --  2.0  PHOS  --   --  3.1  --  3.2   GFR Estimated Creatinine Clearance: 42 mL/min (A) (by C-G formula based on SCr of 1.32 mg/dL (H)). Liver Function Tests: Recent Labs  Lab 03/04/24 1046 03/06/24 0831 03/07/24 0642  AST 30 39 33  ALT 17 15 18   ALKPHOS 50 44 46  BILITOT 1.5* 0.7 0.7  PROT 5.7* 4.9* 5.1*  ALBUMIN 3.5 2.6* 2.6*   Recent Labs  Lab 03/04/24 1046  LIPASE 25   No results for input(s): AMMONIA in the last 168 hours. Coagulation profile Recent Labs  Lab 03/04/24 1046  INR 1.2   COVID-19 Labs  Recent Labs    03/07/24 0642 03/08/24 1434  CRP 11.0* 7.9*    Lab Results  Component Value Date   SARSCOV2NAA NEGATIVE 03/04/2024   SARSCOV2NAA NEGATIVE 04/08/2019    CBC: Recent Labs  Lab 03/04/24 1046 03/05/24 0656 03/06/24 0831 03/07/24 0642 03/08/24 0615  WBC 13.5* 10.3 6.6 6.7 5.6  NEUTROABS 11.4*  --  4.8 4.8  --   HGB 11.5* 10.1* 9.7* 9.8* 10.9*  HCT 35.8* 31.2* 30.1* 30.2* 33.4*  MCV 106.5* 108.0* 108.3* 106.3* 105.4*  PLT 186 144* 128* 149* 189   Cardiac Enzymes: No results for input(s): CKTOTAL, CKMB, CKMBINDEX, TROPONINI in the last 168 hours. BNP (last 3 results) No results for input(s): PROBNP in the last 8760 hours. CBG: No results for input(s): GLUCAP in the last 168 hours. D-Dimer: No results for input(s): DDIMER in the last 72 hours. Hgb A1c: No results for input(s): HGBA1C in the last 72 hours. Lipid Profile: No results for input(s): CHOL, HDL, LDLCALC, TRIG, CHOLHDL, LDLDIRECT in the last 72 hours. Thyroid  function studies: No results for input(s): TSH, T4TOTAL, T3FREE, THYROIDAB in the last 72 hours.  Invalid input(s): FREET3 Anemia work up: No results for input(s): VITAMINB12, FOLATE, FERRITIN, TIBC, IRON, RETICCTPCT in the last 72 hours. Sepsis Labs: Recent Labs  Lab 03/04/24 1255 03/04/24 1449  03/05/24 0656 03/06/24 0831 03/07/24 0642 03/08/24 0615  WBC  --   --  10.3 6.6 6.7 5.6  LATICACIDVEN 2.0* 1.8  --   --   --   --    Microbiology Recent Results (from the past 240 hours)  Blood Culture (routine x 2)     Status: None   Collection Time: 03/04/24 10:46 AM   Specimen: BLOOD  Result Value Ref Range Status   Specimen Description BLOOD SITE NOT SPECIFIED  Final   Special Requests   Final    BOTTLES DRAWN AEROBIC AND ANAEROBIC Blood Culture results may not be optimal due to an inadequate volume of blood received in culture bottles   Culture   Final    NO GROWTH 5 DAYS Performed at [facility], [address], [place], [place] [address]    Report Status 03/09/2024 FINAL  Final  Blood Culture (routine x 2)  Status: None   Collection Time: 03/04/24 10:51 AM   Specimen: BLOOD  Result Value Ref Range Status   Specimen Description BLOOD SITE NOT SPECIFIED  Final   Special Requests   Final    BOTTLES DRAWN AEROBIC AND ANAEROBIC Blood Culture results may not be optimal due to an inadequate volume of blood received in culture bottles   Culture   Final    NO GROWTH 5 DAYS Performed at [facility], [address] [place], [place] [address]    Report Status 03/09/2024 FINAL  Final  Resp panel by RT-PCR (RSV, Flu A&B, Covid) Anterior Nasal Swab     Status: None   Collection Time: 03/04/24  3:23 PM   Specimen: Anterior Nasal Swab  Result Value Ref Range Status   SARS Coronavirus 2 by RT PCR NEGATIVE NEGATIVE Final   Influenza A by PCR NEGATIVE NEGATIVE Final   Influenza B by PCR NEGATIVE NEGATIVE Final    Comment: (NOTE) The Xpert Xpress SARS-CoV-2/FLU/RSV plus assay is intended as an aid in the diagnosis of influenza from Nasopharyngeal swab specimens and should not be used as a sole basis for treatment. Nasal washings and aspirates are unacceptable for Xpert Xpress SARS-CoV-2/FLU/RSV testing.  Fact Sheet for  Patients: BloggerCourse.com  Fact Sheet for Healthcare Providers: SeriousBroker.it  This test is not yet approved or cleared by the [place]  FDA and has been authorized for detection and/or diagnosis of SARS-CoV-2 by FDA under an Emergency Use Authorization (EUA). This EUA will remain in effect (meaning this test can be used) for the duration of the COVID-19 declaration under Section 564(b)(1) of the Act, 21 U.S.C. section 360bbb-3(b)(1), unless the authorization is terminated or revoked.     Resp Syncytial Virus by PCR NEGATIVE NEGATIVE Final    Comment: (NOTE) Fact Sheet for Patients: BloggerCourse.com  Fact Sheet for Healthcare Providers: SeriousBroker.it  This test is not yet approved or cleared by the [place]  FDA and has been authorized for detection and/or diagnosis of SARS-CoV-2 by FDA under an Emergency Use Authorization (EUA). This EUA will remain in effect (meaning this test can be used) for the duration of the COVID-19 declaration under Section 564(b)(1) of the Act, 21 U.S.C. section 360bbb-3(b)(1), unless the authorization is terminated or revoked.  Performed at [facility], [address] [place], [place] [address]      Medications:    acetaminophen   1,000 mg Oral Q8H   amLODipine   2.5 mg Oral Daily   apixaban   2.5 mg Oral BID   cyanocobalamin   500 mcg Oral Daily   finasteride   5 mg Oral Daily   fluticasone  furoate-vilanterol  1 puff Inhalation Daily   gabapentin   100 mg Oral BID   metoprolol  succinate  50 mg Oral Daily   pantoprazole   20 mg Oral Daily   pravastatin   20 mg Oral q1800   tamsulosin   0.4 mg Oral BID   Continuous Infusions:   ceFAZolin  (ANCEF ) IV 2 g (03/09/24 0515)   lactated ringers  75 mL/hr at 03/08/24 1745      LOS: 4 days   [name]  [facility]  03/09/2024, 12:07 PM

## 2024-03-09 NOTE — Progress Notes (Signed)
 Mobility Specialist Progress Note:   03/09/24 1122  Mobility  Activity Transferred from chair to bed  Level of Assistance Minimal assist, patient does 75% or more  Assistive Device Front wheel walker  Distance Ambulated (ft) 5 ft  Activity Response Tolerated well  Mobility Referral Yes  Mobility visit 1 Mobility  Mobility Specialist Start Time (ACUTE ONLY) 0946  Mobility Specialist Stop Time (ACUTE ONLY) 0956  Mobility Specialist Time Calculation (min) (ACUTE ONLY) 10 min   Pt received in chair requesting assistance getting back to bed. Attempted to get pt to ambulate further but d/t strong LLE pain pt refused. Pt was able to take a couple steps towards the bed w/o fault. Left in bed w/ bed alarm on. Call bell and personal belongings in reach. All needs met. Wife in room.  Thersia Minder Mobility Specialist  Please contact vis Secure Chat or  Rehab Office [phone]

## 2024-03-09 NOTE — Plan of Care (Signed)
  Problem: Education: Goal: Knowledge of General Education information will improve Description: Including pain rating scale, medication(s)/side effects and non-pharmacologic comfort measures Outcome: Progressing   Problem: Activity: Goal: Risk for activity intolerance will decrease Outcome: Progressing   Problem: Skin Integrity: Goal: Skin integrity will improve Outcome: Progressing

## 2024-03-10 ENCOUNTER — Other Ambulatory Visit (HOSPITAL_COMMUNITY): Payer: Self-pay

## 2024-03-10 DIAGNOSIS — K5792 Diverticulitis of intestine, part unspecified, without perforation or abscess without bleeding: Secondary | ICD-10-CM | POA: Diagnosis not present

## 2024-03-10 MED ORDER — CEPHALEXIN 500 MG PO CAPS
500.0000 mg | ORAL_CAPSULE | Freq: Three times a day (TID) | ORAL | 0 refills | Status: AC
  Filled 2024-03-10: qty 100, 4d supply, fill #0
  Filled 2024-03-10: qty 21, 7d supply, fill #0

## 2024-03-10 NOTE — Plan of Care (Signed)
  Problem: Education: Goal: Knowledge of General Education information will improve Description: Including pain rating scale, medication(s)/side effects and non-pharmacologic comfort measures 03/10/2024 1019 by Benjamine Shu, RN Outcome: Progressing 03/10/2024 0851 by Benjamine Shu, RN Outcome: Progressing   Problem: Clinical Measurements: Goal: Will remain free from infection 03/10/2024 1019 by Benjamine Shu, RN Outcome: Progressing 03/10/2024 0851 by Benjamine Shu, RN Outcome: Progressing   Problem: Activity: Goal: Risk for activity intolerance will decrease 03/10/2024 1019 by Benjamine Shu, RN Outcome: Progressing 03/10/2024 0851 by Benjamine Shu, RN Outcome: Progressing

## 2024-03-10 NOTE — Plan of Care (Signed)
   Problem: Education: Goal: Knowledge of General Education information will improve Description: Including pain rating scale, medication(s)/side effects and non-pharmacologic comfort measures Outcome: Progressing   Problem: Clinical Measurements: Goal: Will remain free from infection Outcome: Progressing   Problem: Activity: Goal: Risk for activity intolerance will decrease Outcome: Progressing

## 2024-03-10 NOTE — Progress Notes (Signed)
 Discharge summary (AVS) packet provided to pt with instructions. Pt's wife verbalized understanding of instructions. NO complaints. Pt d/c to home as ordered. Pt's spouse is responsible for his transportation. Pt remains alert/oriented in no apparent distress.

## 2024-03-10 NOTE — TOC Transition Note (Incomplete)
 Transition of Care [facility]) - Discharge Note   Patient Details  Name: [name] MRN: [id number] Date of Birth: [birth year]  [facility] CM/SW Contact:  Rosalva Jon Bloch, RN Phone Number: 03/10/2024, 11:09 AM   Clinical Narrative:      Final next level of care: Home w Home Health Services Barriers to Discharge: No Barriers Identified   Patient Goals and CMS Choice     Choice offered to / list presented to : Patient      Discharge Placement                       Discharge Plan and Services Additional resources added to the After Visit Summary for                            [facility] Arranged: PT, OT [facility] Agency: [facility] Date [facility] Agency Contacted: 03/10/24 Time HH Agency Contacted: 1108 Representative spoke with at [facility] Agency: Arna  Social Drivers of Health (SDOH) Interventions SDOH Screenings   Food Insecurity: No Food Insecurity (03/04/2024)  Housing: Low Risk  (03/05/2024)  Transportation Needs: No Transportation Needs (03/05/2024)  Utilities: Not At Risk (03/05/2024)  Depression (PHQ2-9): Low Risk  (11/19/2023)  Financial Resource Strain: Low Risk  (05/01/2022)  Physical Activity: Unknown (05/01/2022)  Social Connections: Moderately Integrated (03/04/2024)  Stress: No Stress Concern Present (05/01/2022)  Tobacco Use: Medium Risk (03/04/2024)     Readmission Risk Interventions    05/29/2023    4:31 PM  Readmission Risk Prevention Plan  Transportation Screening Complete  PCP or Specialist Appt within 5-7 Days Complete  Home Care Screening Complete  Medication Review (RN CM) Referral to Pharmacy

## 2024-03-10 NOTE — Discharge Summary (Signed)
 Physician Discharge Summary  [name] FMW:[id number] DOB: [birth year] DOA: 03/04/2024  PCP: [name], MD  Admit date: 03/04/2024 Discharge date: 03/10/2024  Admitted From: Home Disposition:  Home  Recommendations for Outpatient Follow-up:  Follow up with Urology in 1-2 weeks Please obtain BMP/CBC in one week   Home Health:Yes Equipment/Devices:None  Discharge Condition:Stable CODE STATUS:Full Diet recommendation: Heart Healthy  Brief/Interim Summary: 89 y.o. male past medical history of chronic atrial fibrillation on Eliquis , chronic kidney disease stage III, GERD, discharged from the hospital in October 2024 for MSSA bacteremia in the setting of pneumonia, comes into the hospital fever chills nausea vomiting and abdominal discomfort CT scan of the abdomen pelvis showed acute diverticulitis he was also found to be septic with a left lower extremity cellulitis   Discharge Diagnoses:  Principal Problem:   Diverticulitis Active Problems:   Essential hypertension   ATRIAL FIBRILLATION   CKD (chronic kidney disease) stage 3, GFR 30-59 ml/min (HCC)   Hyperlipidemia   GERD (gastroesophageal reflux disease)   BPH (benign prostatic hyperplasia)   Macrocytic anemia   Chronic edema   Cellulitis  Sepsis due to left lower extremity cellulitis: He was done empirically on antibiotics. Blood cultures were negative x 4. CT scan showed no abscess dislocation septic arthritis osteomyelitis. CRP started trending down. He was transition to oral Keflex  which he continued for a week.  Questionable acute diverticulitis:  he had no abdominal pain, this was seen on imaging. He was started on Rocephin  and azithromycin  after 4 days this was discontinued he remains symptomatic.  Chronic atrial fibrillation: Continue Eliquis  and metoprolol  for rate control.  Essential hypertension: No changes made to his medication.  Hyperlipidemia: Continue statins.  Chronic bilateral lower  extremity edema: Noted.  BPH: Continue Proscar  and Flomax .  Chronic kidney disease stage IIIa: Creatinine remained at baseline.  GERD: Continue PPI  Discharge Instructions  Discharge Instructions     Diet - low sodium heart healthy   Complete by: As directed    Increase activity slowly   Complete by: As directed       Allergies as of 03/10/2024       Reactions   Losartan  Nausea And Vomiting, Other (See Comments)   Elevated creatinine levels        Medication List     TAKE these medications    acetaminophen  500 MG tablet Commonly known as: TYLENOL  Take 500 mg by mouth every 6 (six) hours as needed for mild pain.   amLODipine  2.5 MG tablet Commonly known as: NORVASC  TAKE 1 TABLET DAILY   apixaban  2.5 MG Tabs tablet Commonly known as: Eliquis  Take 1 tablet (2.5 mg total) by mouth 2 (two) times daily.   budesonide -formoterol  80-4.5 MCG/ACT inhaler Commonly known as: Breyna  Inhale 2 puffs into the lungs in the morning and at bedtime.   cephALEXin  250 MG/5ML suspension Commonly known as: KEFLEX  Take 10 mLs (500 mg total) by mouth 3 (three) times daily for 7 days.   finasteride  5 MG tablet Commonly known as: PROSCAR  TAKE 1 TABLET (5 MG TOTAL) BY MOUTH DAILY.   furosemide  40 MG tablet Commonly known as: LASIX  TAKE 1 TABLET EVERY DAY AS NEEDED FOR LEG SWELLING What changed: See the new instructions.   gabapentin  100 MG capsule Commonly known as: NEURONTIN  Take 100 mg by mouth 2 (two) times daily.   glucosamine-chondroitin 500-400 MG tablet Take 2 tablets by mouth daily.   lansoprazole  30 MG capsule Commonly known as: PREVACID  TAKE 1 CAPSULE EVERY  DAY AT 12 NOON. What changed:  how much to take how to take this when to take this additional instructions   lovastatin  20 MG tablet Commonly known as: MEVACOR  TAKE 1 TABLET BY MOUTH EVERYDAY AT BEDTIME What changed: See the new instructions.   metoprolol  succinate 50 MG 24 hr tablet Commonly  known as: TOPROL -XL TAKE 1 TABLET BY MOUTH EVERY DAY WITH OR IMMEDIATELY FOLLOWING A MEAL What changed: See the new instructions.   potassium chloride  10 MEQ tablet Commonly known as: KLOR-CON  TAKE 2 TABLETS BY MOUTH EVERY DAY   tamsulosin  0.4 MG Caps capsule Commonly known as: FLOMAX  Take 1 capsule (0.4 mg total) by mouth daily. What changed: when to take this   vitamin B-12 500 MCG tablet Commonly known as: CYANOCOBALAMIN  Take 500 mcg by mouth daily.   VITAMIN D PO Take 400 Units by mouth daily.        Follow-up Information     Burchette, Wolm ORN, MD Follow up.   Specialty: Family Medicine Contact information: [address] [place] [address] [phone]         [facility] Follow up.   Specialty: Home Health Services Why: home health services will be provided by [facility], start of care within 48 hour post discharge Contact information: [address] [place] [address] [phone]                Allergies  Allergen Reactions   Losartan  Nausea And Vomiting and Other (See Comments)    Elevated creatinine levels     Consultations: None   Procedures/Studies: CT FOOT LEFT W CONTRAST Result Date: 03/08/2024 CLINICAL DATA:  Cellulitis. Evaluate for deep infection. Soft tissue infection suspected. EXAM: CT OF THE LOWER LEFT EXTREMITY WITH CONTRAST TECHNIQUE: Multidetector CT imaging of the left foot was performed according to the standard protocol following intravenous contrast administration. RADIATION DOSE REDUCTION: This exam was performed according to the departmental dose-optimization program which includes automated exposure control, adjustment of the mA and/or kV according to patient size and/or use of iterative reconstruction technique. CONTRAST:  75mL OMNIPAQUE  IOHEXOL  350 MG/ML SOLN COMPARISON:  None Available. FINDINGS: Bones/Joint/Cartilage The bones are demineralized. No evidence of acute fracture,  dislocation or osteomyelitis. No significant ankle joint effusion. Mild clawtoe deformities of the 2nd through 5th digits with mild interphalangeal degenerative changes. Ligaments Suboptimally assessed by CT. Muscles and Tendons As evaluated by CT, the ankle and foot tendons appear intact, without significant tenosynovitis. Mild generalized muscular atrophy without focal fluid collection or suspicious enhancement. Soft tissues Moderate to severe diffuse subcutaneous edema in the distal lower leg with extension into the dorsal aspect of the foot. No evidence of organized fluid collection, foreign body or soft tissue emphysema. IMPRESSION: 1. Moderate to severe diffuse subcutaneous edema in the distal lower leg and dorsal aspect of the foot consistent with cellulitis. No evidence of deep soft tissue infection, abscess, foreign body or soft tissue emphysema. 2. No evidence of osteomyelitis or septic arthritis. 3. Mild clawtoe deformities of the 2nd through 5th digits with mild interphalangeal degenerative changes. Electronically Signed   By: Elsie Perone M.D.   On: 03/08/2024 18:47   CT TIBIA FIBULA LEFT W CONTRAST Result Date: 03/08/2024 CLINICAL DATA:  Cellulitis. Evaluate for deep infection. Soft tissue infection suspected. EXAM: CT OF THE LOWER LEFT EXTREMITY WITH CONTRAST TECHNIQUE: Multidetector CT imaging of the left lower leg was performed according to the standard protocol following intravenous contrast administration.  RADIATION DOSE REDUCTION: This exam was performed according to the departmental dose-optimization program which includes automated exposure control, adjustment of the mA and/or kV according to patient size and/or use of iterative reconstruction technique. CONTRAST:  75mL OMNIPAQUE  IOHEXOL  350 MG/ML SOLN COMPARISON:  Left knee radiographs 07/03/2023. Left lower extremity venous Doppler ultrasound 05/29/2023. FINDINGS: Bones/Joint/Cartilage The bones are demineralized. No evidence of acute  fracture, dislocation or osteomyelitis. Mild degenerative changes in the medial compartment of the right knee. No significant knee joint effusion. No significant ankle joint effusion. Ligaments Suboptimally assessed by CT. Muscles and Tendons The visualized quadriceps and patellar tendons are intact. The ankle tendons are intact. No focal muscular atrophy or abnormal enhancement identified. Soft tissues Small Baker's cyst. Subcutaneous edema throughout the mid to distal lower leg compatible with cellulitis. No organized fluid collection, foreign body or soft tissue emphysema. Scattered vascular calcifications without evidence of acute vascular abnormality. IMPRESSION: 1. Subcutaneous edema throughout the mid to distal lower leg compatible with cellulitis. No organized fluid collection, foreign body or soft tissue emphysema. 2. No evidence of osteomyelitis or septic arthritis. 3. No acute osseous findings. 4. Left foot findings dictated separately. Electronically Signed   By: Elsie Perone M.D.   On: 03/08/2024 18:42   CT ABDOMEN PELVIS W CONTRAST Result Date: 03/04/2024 CLINICAL DATA:  Acute nonlocalized abdominal pain, dysuria, fever EXAM: CT ABDOMEN AND PELVIS WITH CONTRAST TECHNIQUE: Multidetector CT imaging of the abdomen and pelvis was performed using the standard protocol following bolus administration of intravenous contrast. RADIATION DOSE REDUCTION: This exam was performed according to the departmental dose-optimization program which includes automated exposure control, adjustment of the mA and/or kV according to patient size and/or use of iterative reconstruction technique. CONTRAST:  60mL OMNIPAQUE  IOHEXOL  350 MG/ML SOLN COMPARISON:  None Available. FINDINGS: Lower chest: Lingular atelectasis/scarring. Pleural calcifications in the left lower lung. No acute abnormality. Hepatobiliary: No acute abnormality. Pancreas: Unremarkable. Spleen: Unremarkable. Adrenals/Urinary Tract: Unremarkable adrenal  glands. No urinary calculi or hydronephrosis. Multiple small bladder diverticula. Stomach/Bowel: Mild wall thickening and stranding about the mid descending colon. Scattered colonic diverticulosis. No bowel obstruction. Stomach is within normal limits normal appendix. Vascular/Lymphatic: Aortic atherosclerosis. No enlarged abdominal or pelvic lymph nodes. Reproductive: Unremarkable. Other: No free intraperitoneal fluid or air.  No abscess. Musculoskeletal: No acute fracture. IMPRESSION: 1. Mild wall thickening and stranding about the mid descending colon, likely mild diverticulitis. No abscess or free air. 2. Aortic Atherosclerosis (ICD10-I70.0). Electronically Signed   By: Norman Gatlin M.D.   On: 03/04/2024 18:19   VAS US  LOWER EXTREMITY VENOUS (DVT) (7a-7p) Result Date: 03/04/2024  Lower Venous DVT Study Patient Name:  AENEAS LONGSWORTH  Date of Exam:   03/04/2024 Medical Rec #: [id number]         Accession #:    [id number] Date of Birth: 1932-04-22         Patient Gender: M Patient Age:   22 years Exam Location:  [facility] Procedure:      VAS US  LOWER EXTREMITY VENOUS (DVT) Referring Phys: JAYSON PEREYRA --------------------------------------------------------------------------------  Indications: Pain, Swelling, Erythema, and Fever, cellulitis.  Limitations: Pain with minimal touch, patient kicking and twitching leg throughout entire study. Comparison Study: No prior study on file Performing Technologist: Alberta Lis RVS  Examination Guidelines: A complete evaluation includes B-mode imaging, spectral Doppler, color Doppler, and power Doppler as needed of all accessible portions of each vessel. Bilateral testing is considered an integral part of a complete examination. Limited examinations for reoccurring indications may  be performed as noted. The reflux portion of the exam is performed with the patient in reverse Trendelenburg.  +-----+---------------+---------+-----------+----------+--------------+  RIGHTCompressibilityPhasicitySpontaneityPropertiesThrombus Aging +-----+---------------+---------+-----------+----------+--------------+ CFV  Full           Yes      Yes                                 +-----+---------------+---------+-----------+----------+--------------+ SFJ  Full                                                        +-----+---------------+---------+-----------+----------+--------------+   +---------+---------------+---------+-----------+----------+-------------------+ LEFT     CompressibilityPhasicitySpontaneityPropertiesThrombus Aging      +---------+---------------+---------+-----------+----------+-------------------+ CFV      Full           Yes      Yes                                      +---------+---------------+---------+-----------+----------+-------------------+ SFJ      Full                                                             +---------+---------------+---------+-----------+----------+-------------------+ FV Prox  Full           Yes      Yes                                      +---------+---------------+---------+-----------+----------+-------------------+ FV Mid                  Yes      Yes                  patent by color and                                                       Doppler             +---------+---------------+---------+-----------+----------+-------------------+ FV Distal               Yes      Yes                  patent by color and                                                       Doppler             +---------+---------------+---------+-----------+----------+-------------------+ PFV      Full                                                             +---------+---------------+---------+-----------+----------+-------------------+  POP                                                   patent by color and                                                        Doppler             +---------+---------------+---------+-----------+----------+-------------------+ PTV                                                   Not well visualized +---------+---------------+---------+-----------+----------+-------------------+ PERO                                                  Not well visualized +---------+---------------+---------+-----------+----------+-------------------+     Summary: RIGHT: - No evidence of common femoral vein obstruction.   LEFT: - There is no evidence of deep vein thrombosis in the lower extremity. However, portions of this examination were limited- see technologist comments above.  - No cystic structure found in the popliteal fossa.  *See table(s) above for measurements and observations. Electronically signed by Gaile New MD on 03/04/2024 at 5:18:42 PM.    Final    DG Chest Port 1 View Result Date: 03/04/2024 CLINICAL DATA:  Questionable sepsis - evaluate for abnormality EXAM: PORTABLE CHEST - 1 VIEW COMPARISON:  None available. FINDINGS: No focal airspace consolidation, pleural effusion, or pneumothorax. No cardiomegaly.No acute fracture or destructive lesion. IMPRESSION: No acute cardiopulmonary abnormality. Electronically Signed   By: Rogelia Myers M.D.   On: 03/04/2024 11:15   (Echo, Carotid, EGD, Colonoscopy, ERCP)    Subjective: No complaints feels great  Discharge Exam: Vitals:   03/10/24 0830 03/10/24 0921  BP: (!) 145/65   Pulse: 81   Resp: 17   Temp: 98.1 F (36.7 C)   SpO2: 97% 97%   Vitals:   03/09/24 2108 03/10/24 0623 03/10/24 0830 03/10/24 0921  BP: (!) 153/55 (!) 156/68 (!) 145/65   Pulse: 79 80 81   Resp: 18 18 17    Temp: 98.4 F (36.9 C) 98.2 F (36.8 C) 98.1 F (36.7 C)   TempSrc: Oral Oral Oral   SpO2: 97% 97% 97% 97%  Weight:      Height:        General: Pt is alert, awake, not in acute distress Cardiovascular: RRR, S1/S2 +, no rubs, no gallops Respiratory: CTA bilaterally, no  wheezing, no rhonchi Abdominal: Soft, NT, ND, bowel sounds + Extremities: no edema, no cyanosis    The results of significant diagnostics from this hospitalization (including imaging, microbiology, ancillary and laboratory) are listed below for reference.     Microbiology: Recent Results (from the past 240 hours)  Blood Culture (routine x 2)     Status: None   Collection Time: 03/04/24 10:46 AM   Specimen: BLOOD  Result Value Ref Range Status   Specimen  Description BLOOD SITE NOT SPECIFIED  Final   Special Requests   Final    BOTTLES DRAWN AEROBIC AND ANAEROBIC Blood Culture results may not be optimal due to an inadequate volume of blood received in culture bottles   Culture   Final    NO GROWTH 5 DAYS Performed at [facility], [address], [place], [place] [address]    Report Status 03/09/2024 FINAL  Final  Blood Culture (routine x 2)     Status: None   Collection Time: 03/04/24 10:51 AM   Specimen: BLOOD  Result Value Ref Range Status   Specimen Description BLOOD SITE NOT SPECIFIED  Final   Special Requests   Final    BOTTLES DRAWN AEROBIC AND ANAEROBIC Blood Culture results may not be optimal due to an inadequate volume of blood received in culture bottles   Culture   Final    NO GROWTH 5 DAYS Performed at [facility], [address] [place], [place] [address]    Report Status 03/09/2024 FINAL  Final  Resp panel by RT-PCR (RSV, Flu A&B, Covid) Anterior Nasal Swab     Status: None   Collection Time: 03/04/24  3:23 PM   Specimen: Anterior Nasal Swab  Result Value Ref Range Status   SARS Coronavirus 2 by RT PCR NEGATIVE NEGATIVE Final   Influenza A by PCR NEGATIVE NEGATIVE Final   Influenza B by PCR NEGATIVE NEGATIVE Final    Comment: (NOTE) The Xpert Xpress SARS-CoV-2/FLU/RSV plus assay is intended as an aid in the diagnosis of influenza from Nasopharyngeal swab specimens and should not be used as a sole basis for treatment. Nasal washings  and aspirates are unacceptable for Xpert Xpress SARS-CoV-2/FLU/RSV testing.  Fact Sheet for Patients: BloggerCourse.com  Fact Sheet for Healthcare Providers: SeriousBroker.it  This test is not yet approved or cleared by the [place]  FDA and has been authorized for detection and/or diagnosis of SARS-CoV-2 by FDA under an Emergency Use Authorization (EUA). This EUA will remain in effect (meaning this test can be used) for the duration of the COVID-19 declaration under Section 564(b)(1) of the Act, 21 U.S.C. section 360bbb-3(b)(1), unless the authorization is terminated or revoked.     Resp Syncytial Virus by PCR NEGATIVE NEGATIVE Final    Comment: (NOTE) Fact Sheet for Patients: BloggerCourse.com  Fact Sheet for Healthcare Providers: SeriousBroker.it  This test is not yet approved or cleared by the [place]  FDA and has been authorized for detection and/or diagnosis of SARS-CoV-2 by FDA under an Emergency Use Authorization (EUA). This EUA will remain in effect (meaning this test can be used) for the duration of the COVID-19 declaration under Section 564(b)(1) of the Act, 21 U.S.C. section 360bbb-3(b)(1), unless the authorization is terminated or revoked.  Performed at [facility], [address], [place], [place] [address]      Labs: BNP (last 3 results) No results for input(s): BNP in the last 8760 hours. Basic Metabolic Panel: Recent Labs  Lab 03/04/24 1046 03/05/24 0656 03/06/24 0831 03/07/24 0642 03/08/24 0615  NA 143 141 137 138 140  K 4.0 3.9 3.9 3.9 4.0  CL 104 105 104 103 103  CO2 27 26 22 24 25   GLUCOSE 136* 110* 127* 127* 99  BUN 27* 25* 25* 25* 21  CREATININE 1.47* 1.39* 1.53* 1.63* 1.32*  CALCIUM 9.0 8.5* 8.4* 8.3* 8.9  MG  --   --  1.9  --  2.0  PHOS  --   --  3.1  --  3.2   Liver Function Tests: Recent Labs  Lab 03/04/24 1046  03/06/24 0831 03/07/24 0642  AST 30 39 33  ALT 17 15 18   ALKPHOS 50 44 46  BILITOT 1.5* 0.7 0.7  PROT 5.7* 4.9* 5.1*  ALBUMIN 3.5 2.6* 2.6*   Recent Labs  Lab 03/04/24 1046  LIPASE 25   No results for input(s): AMMONIA in the last 168 hours. CBC: Recent Labs  Lab 03/04/24 1046 03/05/24 0656 03/06/24 0831 03/07/24 0642 03/08/24 0615  WBC 13.5* 10.3 6.6 6.7 5.6  NEUTROABS 11.4*  --  4.8 4.8  --   HGB 11.5* 10.1* 9.7* 9.8* 10.9*  HCT 35.8* 31.2* 30.1* 30.2* 33.4*  MCV 106.5* 108.0* 108.3* 106.3* 105.4*  PLT 186 144* 128* 149* 189   Cardiac Enzymes: No results for input(s): CKTOTAL, CKMB, CKMBINDEX, TROPONINI in the last 168 hours. BNP: Invalid input(s): POCBNP CBG: No results for input(s): GLUCAP in the last 168 hours. D-Dimer No results for input(s): DDIMER in the last 72 hours. Hgb A1c No results for input(s): HGBA1C in the last 72 hours. Lipid Profile No results for input(s): CHOL, HDL, LDLCALC, TRIG, CHOLHDL, LDLDIRECT in the last 72 hours. Thyroid  function studies No results for input(s): TSH, T4TOTAL, T3FREE, THYROIDAB in the last 72 hours.  Invalid input(s): FREET3 Anemia work up No results for input(s): VITAMINB12, FOLATE, FERRITIN, TIBC, IRON, RETICCTPCT in the last 72 hours. Urinalysis    Component Value Date/Time   COLORURINE STRAW (A) 03/04/2024 1046   APPEARANCEUR CLEAR 03/04/2024 1046   LABSPEC 1.008 03/04/2024 1046   PHURINE 6.0 03/04/2024 1046   GLUCOSEU NEGATIVE 03/04/2024 1046   HGBUR NEGATIVE 03/04/2024 1046   BILIRUBINUR NEGATIVE 03/04/2024 1046   BILIRUBINUR Negative 04/29/2023 1342   KETONESUR NEGATIVE 03/04/2024 1046   PROTEINUR NEGATIVE 03/04/2024 1046   UROBILINOGEN 0.2 04/29/2023 1342   UROBILINOGEN 1 02/23/2014 1404   NITRITE NEGATIVE 03/04/2024 1046   LEUKOCYTESUR NEGATIVE 03/04/2024 1046   Sepsis Labs Recent Labs  Lab 03/05/24 0656 03/06/24 0831 03/07/24 0642  03/08/24 0615  WBC 10.3 6.6 6.7 5.6   Microbiology Recent Results (from the past 240 hours)  Blood Culture (routine x 2)     Status: None   Collection Time: 03/04/24 10:46 AM   Specimen: BLOOD  Result Value Ref Range Status   Specimen Description BLOOD SITE NOT SPECIFIED  Final   Special Requests   Final    BOTTLES DRAWN AEROBIC AND ANAEROBIC Blood Culture results may not be optimal due to an inadequate volume of blood received in culture bottles   Culture   Final    NO GROWTH 5 DAYS Performed at [facility], [address] [place], [place] [address]    Report Status 03/09/2024 FINAL  Final  Blood Culture (routine x 2)     Status: None   Collection Time: 03/04/24 10:51 AM   Specimen: BLOOD  Result Value Ref Range Status   Specimen Description BLOOD SITE NOT SPECIFIED  Final   Special Requests   Final    BOTTLES DRAWN AEROBIC AND ANAEROBIC Blood Culture results may not be optimal due to an inadequate volume of blood received in culture bottles   Culture   Final    NO GROWTH 5 DAYS Performed at [facility], [address], [place], [place] [address]    Report Status 03/09/2024 FINAL  Final  Resp panel by RT-PCR (RSV, Flu A&B, Covid) Anterior Nasal Swab  Status: None   Collection Time: 03/04/24  3:23 PM   Specimen: Anterior Nasal Swab  Result Value Ref Range Status   SARS Coronavirus 2 by RT PCR NEGATIVE NEGATIVE Final   Influenza A by PCR NEGATIVE NEGATIVE Final   Influenza B by PCR NEGATIVE NEGATIVE Final    Comment: (NOTE) The Xpert Xpress SARS-CoV-2/FLU/RSV plus assay is intended as an aid in the diagnosis of influenza from Nasopharyngeal swab specimens and should not be used as a sole basis for treatment. Nasal washings and aspirates are unacceptable for Xpert Xpress SARS-CoV-2/FLU/RSV testing.  Fact Sheet for Patients: BloggerCourse.com  Fact Sheet for Healthcare Providers: SeriousBroker.it  This  test is not yet approved or cleared by the [place]  FDA and has been authorized for detection and/or diagnosis of SARS-CoV-2 by FDA under an Emergency Use Authorization (EUA). This EUA will remain in effect (meaning this test can be used) for the duration of the COVID-19 declaration under Section 564(b)(1) of the Act, 21 U.S.C. section 360bbb-3(b)(1), unless the authorization is terminated or revoked.     Resp Syncytial Virus by PCR NEGATIVE NEGATIVE Final    Comment: (NOTE) Fact Sheet for Patients: BloggerCourse.com  Fact Sheet for Healthcare Providers: SeriousBroker.it  This test is not yet approved or cleared by the [place]  FDA and has been authorized for detection and/or diagnosis of SARS-CoV-2 by FDA under an Emergency Use Authorization (EUA). This EUA will remain in effect (meaning this test can be used) for the duration of the COVID-19 declaration under Section 564(b)(1) of the Act, 21 U.S.C. section 360bbb-3(b)(1), unless the authorization is terminated or revoked.  Performed at [facility], [address], [place], [place] [address]      Time coordinating discharge: Over 35 minutes  SIGNED:   Erle Odell Castor, MD  [facility] 03/10/2024, 10:54 AM Pager   If 7PM-7AM, please contact night-coverage [email or web address] Password [email or web address]

## 2024-03-10 NOTE — Progress Notes (Signed)
 Physical Therapy Treatment Patient Details Name: [name] MRN: [id number] DOB: 1932-03-27 Today's Date: 03/10/2024   History of Present Illness 89 y.o. male presents to [facility] hospital on 03/04/2024 with multiple episodes of nausea and vomiting as well as LE edema. Pt admitted for management of LLE cellulitis. PMH includes afib, HTH, CKD III, chronic anemia, GERD.    PT Comments  Pt resting in bed on arrival, pleasant and agreeable to session with continued progress towards acute goals. Pt demonstrating transfers and gait with RW and rollator support with grossly CGA for safety. Pt needing increased cues for safety throughout mobility as pt with tendency for increased and unsafe gait speed with rollator as well as attempting to sit before turning and backing up completely to sitting surface. Pt was educated on continued walker use to maximize functional independence, safety, and decrease risk for falls, however pt and spouse verbalizing preference for rollator. Session truncated as pt seated on commode and needing increased time for attempting bowel movement. Pt continues to benefit from skilled PT services to progress toward functional mobility goals.      If plan is discharge home, recommend the following: A little help with walking and/or transfers;A little help with bathing/dressing/bathroom;Direct supervision/assist for medications management;Direct supervision/assist for financial management;Assist for transportation;Help with stairs or ramp for entrance   Can travel by private vehicle        Equipment Recommendations  None recommended by PT    Recommendations for Other Services       Precautions / Restrictions Precautions Precautions: Fall Recall of Precautions/Restrictions: Impaired Precaution/Restrictions Comments: history of cognitive impairment Restrictions Weight Bearing Restrictions Per Provider Order: No     Mobility  Bed Mobility Overal bed mobility: Needs  Assistance Bed Mobility: Supine to Sit     Supine to sit: Supervision     General bed mobility comments: VC to initiate task only. No physical assist required.    Transfers Overall transfer level: Needs assistance Equipment used: Rolling walker (2 wheels), Rollator (4 wheels) Transfers: Sit to/from Stand, Bed to chair/wheelchair/BSC Sit to Stand: Supervision, From elevated surface           General transfer comment: Required VC multiple times for hand placement during RW management prior to sit<>stand transition. Wife reports that pt normally has a difficult time with power up into standing. improved power up from chair with armrests, max cues to turn all the way around before sitting as pt reaching and attempting to sit prematurely    Ambulation/Gait Ambulation/Gait assistance: Contact guard assist Gait Distance (Feet): 50 Feet (+ 125') Assistive device: Rolling walker (2 wheels), Rollator (4 wheels) Gait Pattern/deviations: Step-through pattern, Decreased stride length, Drifts right/left, Trunk flexed Gait velocity: reduced     General Gait Details: Pt ambulates w/ reciprocal gait pattern and increased reliance on RW and rollator for stability. pt with increased cadence with rollator support with flexed posture, increased stability noted with RW support   Stairs             Wheelchair Mobility     Tilt Bed    Modified Rankin (Stroke Patients Only)       Balance Overall balance assessment: Needs assistance, History of Falls Sitting-balance support: No upper extremity supported, Feet supported Sitting balance-Leahy Scale: Fair Sitting balance - Comments: seated EOB   Standing balance support: Bilateral upper extremity supported, During functional activity, Reliant on assistive device for balance Standing balance-Leahy Scale: Poor Standing balance comment: reliant on external support  Communication  Communication Communication: Impaired Factors Affecting Communication: Hearing impaired  Cognition Arousal: Alert Behavior During Therapy: WFL for tasks assessed/performed   PT - Cognitive impairments: History of cognitive impairments, Memory, Problem solving, Sequencing, Safety/Judgement                       PT - Cognition Comments: pt needing increased cues for safety with Following commands: Intact      Cueing Cueing Techniques: Verbal cues, Visual cues  Exercises      General Comments General comments (skin integrity, edema, etc.): Redness and increased edema present with left foot/ankle region.      Pertinent Vitals/Pain Pain Assessment Pain Assessment: Faces Faces Pain Scale: Hurts little more Pain Location: L foot/lower leg Pain Descriptors / Indicators: Aching, Grimacing Pain Intervention(s): Monitored during session, Limited activity within patient's tolerance    Home Living                          Prior Function            PT Goals (current goals can now be found in the care plan section) Acute Rehab PT Goals Patient Stated Goal: to go home PT Goal Formulation: With patient/family Time For Goal Achievement: 03/21/24 Progress towards PT goals: Progressing toward goals    Frequency    Min 2X/week      PT Plan      Co-evaluation              AM-PAC PT 6 Clicks Mobility   Outcome Measure  Help needed turning from your back to your side while in a flat bed without using bedrails?: A Little Help needed moving from lying on your back to sitting on the side of a flat bed without using bedrails?: A Little Help needed moving to and from a bed to a chair (including a wheelchair)?: A Little Help needed standing up from a chair using your arms (e.g., wheelchair or bedside chair)?: A Little Help needed to walk in hospital room?: A Little Help needed climbing 3-5 steps with a railing? : A Little 6 Click Score: 18    End of  Session Equipment Utilized During Treatment: Gait belt Activity Tolerance: Patient tolerated treatment well Patient left: Other (comment) (in bathroom on commode, spouse present and verbalizing understanding to pull cord when finished) Nurse Communication: Mobility status PT Visit Diagnosis: Other abnormalities of gait and mobility (R26.89);History of falling (Z91.81);Muscle weakness (generalized) (M62.81);Unsteadiness on feet (R26.81)     Time: 9046-8992 PT Time Calculation (min) (ACUTE ONLY): 14 min  Charges:    $Gait Training: 8-22 mins PT General Charges $$ ACUTE PT VISIT: 1 Visit                     Steven Moroney R. PTA Acute Rehabilitation Services Office: [phone]   [name] 03/10/2024, 10:42 AM

## 2024-03-11 ENCOUNTER — Telehealth: Payer: Self-pay

## 2024-03-11 NOTE — Transitions of Care (Post Inpatient/ED Visit) (Signed)
   03/11/2024  Name: [name] MRN: [id number] DOB: 1932/01/18  Today's TOC FU Call Status: Today's TOC FU Call Status:: Successful TOC FU Call Completed TOC FU Call Complete Date: 03/11/24 Patient's Name and Date of Birth confirmed.  Transition Care Management Follow-up Telephone Call Discharge Facility: [facility]) Type of Discharge: Inpatient Admission Primary Inpatient Discharge Diagnosis:: Sepsis; Encelpholapthy How have you been since you were released from the hospital?: Better Any questions or concerns?: No  Items Reviewed: Did you receive and understand the discharge instructions provided?: Yes Medications obtained,verified, and reconciled?: Partial Review Completed Reason for Partial Mediation Review: Attempted to go through list from AVS and wife states we have them all Any new allergies since your discharge?: No Do you have support at home?: Yes People in Home [RPT]: spouse Name of Support/Comfort Primary Source: [name]  Medications Reviewed Today: Medications Reviewed Today   Medications were not reviewed in this encounter     Home Care and Equipment/Supplies: Were Home Health Services Ordered?: Yes Name of Home Health Agency:: [facility] Has Agency set up a time to come to your home?: Yes First Home Health Visit Date:  (Wife states they called and left a voicemail about calling or coming today and she called back awaiting for a return call)     Follow up appointments reviewed: PCP Follow-up appointment confirmed?: No (Need to call on Monday with Dr. Micheal) Wife states, it's getting late and would like a callback on Monday    Richerd Fish, RN, Scientist, research (physical sciences), CCM [employer], [employer] [facility] RN Care Manager Direct Dial: [phone]

## 2024-03-14 ENCOUNTER — Telehealth: Payer: Self-pay

## 2024-03-14 NOTE — Transitions of Care (Post Inpatient/ED Visit) (Signed)
 03/14/2024  Name: [name] MRN: [id number] DOB: 1931/10/16  Today's TOC FU Call Status:   Patient's Name and Date of Birth confirmed.  Transition Care Management Follow-up Telephone Call Date of Discharge: 03/10/24 How have you been since you were released from the hospital?:  (weak)  Items Reviewed: Dietary orders reviewed?: Yes Type of Diet Ordered:: low salt Do you have support at home?: Yes People in Home [RPT]: spouse  Medications Reviewed Today: Medications Reviewed Today     Reviewed by Steven Alan PENNER, RN (Registered Nurse) on 03/14/24 at 1348  Med List Status: <None>   Medication Order Taking? Sig Documenting Provider Last Dose Status Informant  acetaminophen  (TYLENOL ) 500 MG tablet [id number] No Take 500 mg by mouth every 6 (six) hours as needed for mild pain. [provider] Unknown Active Spouse/Significant Other, Pharmacy Records, Multiple Informants  amLODipine  (NORVASC ) 2.5 MG tablet [id number] No TAKE 1 TABLET DAILY Burchette, Steven LELON, MD 03/04/2024 Active Spouse/Significant Other, Pharmacy Records, Multiple Informants  apixaban  (ELIQUIS ) 2.5 MG TABS tablet [id number] No Take 1 tablet (2.5 mg total) by mouth 2 (two) times daily. Steven Wilbert SAUNDERS, MD 03/04/2024  7:30 AM Active Spouse/Significant Other, Pharmacy Records, Multiple Informants           Med Note Steven, ADELITA   Fri Mar 04, 2024  3:18 PM) Dose was recently decreased due to excessive bleeding  budesonide -formoterol  (BREYNA ) 80-4.5 MCG/ACT inhaler [id number]  Inhale 2 puffs into the lungs in the morning and at bedtime. Steven Steven LELON, MD  Active Spouse/Significant Other, Pharmacy Records, Multiple Informants  cephALEXin  (KEFLEX ) 500 MG capsule [id number]  Take 1 capsule (500 mg total) by mouth 3 (three) times daily for 7 days. Steven Celinda Balo, MD  Active   finasteride  (PROSCAR ) 5 MG tablet [id number] No TAKE 1 TABLET (5 MG TOTAL) BY MOUTH DAILY. Steven Steven LELON, MD 03/04/2024 Active  Spouse/Significant Other, Pharmacy Records, Multiple Informants  furosemide  (LASIX ) 40 MG tablet [id number] No TAKE 1 TABLET EVERY DAY AS NEEDED FOR LEG SWELLING  Patient taking differently: Take 40 mg by mouth 2 (two) times daily.   Steven Steven LELON, MD 03/04/2024 Active Spouse/Significant Other, Pharmacy Records, Multiple Informants           Med Note Steven, ADELITA   Fri Mar 04, 2024  3:19 PM) For the past week patient and wife started to take BID instead of QD  gabapentin  (NEURONTIN ) 100 MG capsule [id number] No Take 100 mg by mouth 2 (two) times daily. [provider] 03/04/2024 Active Spouse/Significant Other, Pharmacy Records, Multiple Informants  glucosamine-chondroitin 500-400 MG tablet [id number] No Take 2 tablets by mouth daily. [provider] 03/04/2024 Active Spouse/Significant Other, Pharmacy Records, Multiple Informants  lansoprazole  (PREVACID ) 30 MG capsule [id number] No TAKE 1 CAPSULE EVERY DAY AT 12 NOON.  Patient taking differently: Take 30 mg by mouth daily at 12 noon.   Steven Wilbert SAUNDERS, MD 03/04/2024 Active Spouse/Significant Other, Pharmacy Records, Multiple Informants  lovastatin  (MEVACOR ) 20 MG tablet [id number] No TAKE 1 TABLET BY MOUTH EVERYDAY AT BEDTIME  Patient taking differently: Take 20 mg by mouth at bedtime.   Steven Steven LELON, MD 03/03/2024 Active Spouse/Significant Other, Pharmacy Records, Multiple Informants  metoprolol  succinate (TOPROL -XL) 50 MG 24 hr tablet [id number] No TAKE 1 TABLET BY MOUTH EVERY DAY WITH OR IMMEDIATELY FOLLOWING A MEAL  Patient taking differently: Take 50 mg by mouth daily.   Steven Wilbert SAUNDERS, MD 03/04/2024 Active Spouse/Significant Other, Pharmacy Records, Multiple Informants  potassium chloride  (  KLOR-CON ) 10 MEQ tablet [id number] No TAKE 2 TABLETS BY MOUTH EVERY DAY Steven Steven ORN, MD 03/04/2024 Active Spouse/Significant Other, Pharmacy Records, Multiple Informants  tamsulosin  (FLOMAX ) 0.4 MG CAPS capsule [id number] No  Take 1 capsule (0.4 mg total) by mouth daily.  Patient taking differently: Take 0.4 mg by mouth in the morning and at bedtime.   Steven Steven ORN, MD 03/04/2024 Active Spouse/Significant Other, Pharmacy Records, Multiple Informants  vitamin B-12 (CYANOCOBALAMIN ) 500 MCG tablet [id number] No Take 500 mcg by mouth daily. [provider] 03/04/2024 Active Spouse/Significant Other, Pharmacy Records, Multiple Informants  VITAMIN D PO [id number] No Take 400 Units by mouth daily.  [provider] 03/04/2024 Active Spouse/Significant Other, Pharmacy Records, Multiple Informants  Med List Note [name], CPhT 03/04/24 1526): Spouse manages medications             Home Care and Equipment/Supplies: First Home Health Visit Date: 03/12/24 Any new equipment or medical supplies ordered?: No  Functional Questionnaire: Do you need assistance with bathing/showering or dressing?: No Do you need assistance with meal preparation?: Yes Do you need assistance with eating?: No Do you have difficulty maintaining continence: Yes (diarrhea) Do you need assistance with getting out of bed/getting out of a chair/moving?: No Do you have difficulty managing or taking your medications?: Yes (wife manages medications)  Follow up appointments reviewed: PCP Follow-up appointment confirmed?: Yes Date of PCP follow-up appointment?: 03/16/24 Follow-up Provider: PCP Specialist Hospital Follow-up appointment confirmed?: No Reason Specialist Follow-Up Not Confirmed: Patient has Specialist Provider Number and will Call for Appointment Do you need transportation to your follow-up appointment?: No Do you understand care options if your condition(s) worsen?: Yes-patient verbalized understanding  SDOH Interventions Today    Flowsheet Row Most Recent Value  SDOH Interventions   Food Insecurity Interventions Intervention Not Indicated  Housing Interventions Intervention Not Indicated  Transportation  Interventions Intervention Not Indicated  Utilities Interventions Intervention Not Indicated   Spoke with wife who is on the [place].  Wife reports patient is doing well. Reports weaker than normal.  Homehealth came on Saturday 03/12/2024 to open case an wife is waiting for PT to start.   Wife reports patient has all medications and is taking them as prescribed. Wife has scheduled hospital follow up for this week and states that she has a caregiver who will assist with transportation.   Reviewed importance of hospital follow up, taking all medications as prescribed.  Reviewed TOC program and wife declined but wanted to call me if she needs me. I provided my contact information.    Alan Ee, RN, BSN, CEN [employer]- Transition of Care Team.  [facility] [phone]

## 2024-03-15 ENCOUNTER — Ambulatory Visit: Payer: Self-pay

## 2024-03-15 NOTE — Telephone Encounter (Signed)
 FYI Only or Action Required?: FYI only for provider.  Patient was last seen in primary care on 02/05/2024 by Micheal Wolm ORN, MD.  Called Nurse Triage reporting Pain.  Symptoms began a week ago.  Interventions attempted: OTC medications: tylenol  & gabapentin .  Symptoms are: gradually worsening.  Triage Disposition: See Physician Within 24 Hours  Patient/caregiver understands and will follow disposition?: Yes   Copied from CRM [id number]. Topic: Clinical - Red Word Triage >> Mar 15, 2024 11:13 AM Maisie C wrote: Red Word that prompted transfer to Nurse Triage: nerve pain, difficulty moving around, severe back pain Reason for Disposition  [1] MODERATE weakness (e.g., interferes with work, school, normal activities) AND [2] persists > 3 days  [1] MODERATE pain (e.g., interferes with normal activities, limping) AND [2] present > 3 days  Answer Assessment - Initial Assessment Questions 1. ONSET: When did the pain start?      today 2. LOCATION: Where is the pain located?      Left leg 3. PAIN: How bad is the pain?    (Scale 1-10; or mild, moderate, severe)     severe 4. WORK OR EXERCISE: Has there been any recent work or exercise that involved this part of the body?      no 5. CAUSE: What do you think is causing the leg pain?     Nerve pain 6. OTHER SYMPTOMS: Do you have any other symptoms? (e.g., chest pain, back pain, breathing difficulty, swelling, rash, fever, numbness, weakness)     no 7. PREGNANCY: Is there any chance you are pregnant? When was your last menstrual period?     na  Answer Assessment - Initial Assessment Questions 1. DESCRIPTION: Describe how you are feeling.     tired 2. SEVERITY: How bad is it?  Can you stand and walk?     Difficulty walking 3. ONSET: When did these symptoms begin? (e.g., hours, days, weeks, months)     Ongoing x week 4. CAUSE: What do you think is causing the weakness or fatigue? (e.g., not drinking enough  fluids, medical problem, trouble sleeping)     No eating or drinking very well 5. NEW MEDICINES:  Have you started on any new medicines recently? (e.g., opioid pain medicines, benzodiazepines, muscle relaxants, antidepressants, antihistamines, neuroleptics, beta blockers)     N.a 6. OTHER SYMPTOMS: Do you have any other symptoms? (e.g., chest pain, fever, cough, SOB, vomiting, diarrhea, bleeding, other areas of pain)    Sob w/ exertion 7. PREGNANCY: Is there any chance you are pregnant? When was your last menstrual period?     Na  Pt already has appt scheduled for tomorrow: checked schedule and no openings for today    Pt not eating or drinking well  Protocols used: Leg Pain-A-AH, Weakness (Generalized) and Fatigue-A-AH

## 2024-03-16 ENCOUNTER — Encounter: Payer: Self-pay | Admitting: Family Medicine

## 2024-03-16 ENCOUNTER — Ambulatory Visit: Admitting: Family Medicine

## 2024-03-16 VITALS — BP 154/60 | HR 104 | Temp 97.8°F | Wt 185.7 lb

## 2024-03-16 DIAGNOSIS — E440 Moderate protein-calorie malnutrition: Secondary | ICD-10-CM | POA: Diagnosis not present

## 2024-03-16 DIAGNOSIS — R3914 Feeling of incomplete bladder emptying: Secondary | ICD-10-CM

## 2024-03-16 DIAGNOSIS — L03116 Cellulitis of left lower limb: Secondary | ICD-10-CM

## 2024-03-16 DIAGNOSIS — N1832 Chronic kidney disease, stage 3b: Secondary | ICD-10-CM

## 2024-03-16 DIAGNOSIS — N401 Enlarged prostate with lower urinary tract symptoms: Secondary | ICD-10-CM | POA: Diagnosis not present

## 2024-03-16 LAB — CBC WITH DIFFERENTIAL/PLATELET
Basophils Absolute: 0.1 K/uL (ref 0.0–0.1)
Basophils Relative: 0.5 % (ref 0.0–3.0)
Eosinophils Absolute: 0 K/uL (ref 0.0–0.7)
Eosinophils Relative: 0.4 % (ref 0.0–5.0)
HCT: 37.5 % — ABNORMAL LOW (ref 39.0–52.0)
Hemoglobin: 12 g/dL — ABNORMAL LOW (ref 13.0–17.0)
Lymphocytes Relative: 14.9 % (ref 12.0–46.0)
Lymphs Abs: 1.6 K/uL (ref 0.7–4.0)
MCHC: 32 g/dL (ref 30.0–36.0)
MCV: 104.3 fl — ABNORMAL HIGH (ref 78.0–100.0)
Monocytes Absolute: 1.4 K/uL — ABNORMAL HIGH (ref 0.1–1.0)
Monocytes Relative: 13.5 % — ABNORMAL HIGH (ref 3.0–12.0)
Neutro Abs: 7.5 K/uL (ref 1.4–7.7)
Neutrophils Relative %: 70.7 % (ref 43.0–77.0)
Platelets: 335 K/uL (ref 150.0–400.0)
RBC: 3.59 Mil/uL — ABNORMAL LOW (ref 4.22–5.81)
RDW: 17.6 % — ABNORMAL HIGH (ref 11.5–15.5)
WBC: 10.7 K/uL — ABNORMAL HIGH (ref 4.0–10.5)

## 2024-03-16 LAB — BASIC METABOLIC PANEL WITH GFR
BUN: 21 mg/dL (ref 6–23)
CO2: 26 meq/L (ref 19–32)
Calcium: 9.3 mg/dL (ref 8.4–10.5)
Chloride: 103 meq/L (ref 96–112)
Creatinine, Ser: 1.28 mg/dL (ref 0.40–1.50)
GFR: 49.43 mL/min — ABNORMAL LOW (ref >60.00)
Glucose, Bld: 85 mg/dL (ref 70–99)
Potassium: 4.2 meq/L (ref 3.5–5.1)
Sodium: 139 meq/L (ref 135–145)

## 2024-03-16 LAB — C-REACTIVE PROTEIN: CRP: 1.4 mg/dL (ref 0.5–20.0)

## 2024-03-16 NOTE — Progress Notes (Signed)
 Established Patient Office Visit  Subjective   Patient ID: [name], male    DOB: 01-Jan-1932  Age: 89 y.o. MRN: [id number]  Chief Complaint  Patient presents with   Hospitalization Follow-up    HPI   [name] is seen for hospital follow-up.  He was admitted the 11th through the 17th for possible diverticulitis and cellulitis left lower extremity.  He has chronic problems including chronic kidney disease, chronic lower extremity edema, GERD, atrial fibrillation.  Presented with some fever and chills and apparently some nausea and mild abdominal discomfort.  CT scan abdomen pelvis showed possible acute diverticulitis left colon.  Was also found to have cellulitis left lower extremity.  Treated with IV antibiotics.  Initially received Rocephin  and Zithromax .  Was transition to Keflex  prior to discharge.  Blood cultures remain negative.  Had CT scans of the lower extremity which showed no osteomyelitis.  C-reactive proteins trending down from 16.9-7.9 at discharge.  Since discharge has been stable.  No fever.  No chills.  Still has some mild redness left lower extremity but wife states is overall improved.  They have not noted any significant warmth.  No abdominal pain.  Occasional loose stools but no severe diarrhea.  Also had some difficulties urinating intermittently but has noticed if he stands up he is able to urinate.  Takes finasteride  and Flomax  regularly.  Has seen urologist for this previously.  No current burning with urination.  Chronic lower extremity edema.  Recent albumin 2.6.  Wife states he eats fairly poorly.  Craves sweets but does not get a lot of great quality proteins.  Currently on Lasix  40 mg once daily.  Past Medical History:  Diagnosis Date   Actinic keratosis 03/13/2009   Arthritis    BENIGN PROSTATIC HYPERTROPHY, HX OF 04/07/2009   CARCINOMA, SKIN, SQUAMOUS CELL 09/11/2009   COLONIC POLYPS, HX OF 03/13/2009   Essential hypertension    GERD 03/13/2009    Hyperlipidemia    INSOMNIA, CHRONIC 09/11/2009   Persistent atrial fibrillation (HCC) 04/07/2009   a. s/p afib ablation at [facility] x 2 in 2010.   POLYPECTOMY, HX OF 04/07/2009   Premature atrial contractions    PULMONARY NODULE 09/11/2009   PVC's (premature ventricular contractions)    Rosacea 03/13/2009   SKIN CANCER, HX OF 03/13/2009   Past Surgical History:  Procedure Laterality Date   ABLATION  2010   x2   COLONOSCOPY W/ BIOPSIES AND POLYPECTOMY     EYE SURGERY Bilateral    cataracts   LUMBAR DISC SURGERY     RUPTURE   LUMBAR LAMINECTOMY/DECOMPRESSION MICRODISCECTOMY N/A 04/20/2014   Procedure: LUMBAR TWO-THREE, LUMBAR THREE-FOUR, LUMAR FOUR-FIVE LAMINECTOMIES;  Surgeon: Reyes Budge, MD;  Location: [facility] NEURO ORS;  Service: Neurosurgery;  Laterality: N/A;   POLYPECTOMY     SKIN CANCER EXCISION     TEE WITHOUT CARDIOVERSION N/A 06/01/2023   Procedure: TRANSESOPHAGEAL ECHOCARDIOGRAM;  Surgeon: Okey Vina GAILS, MD;  Location: [facility] INVASIVE CV LAB;  Service: Cardiovascular;  Laterality: N/A;    reports that he quit smoking about 36 years ago. His smoking use included cigarettes. He started smoking about 66 years ago. He has a 30 pack-year smoking history. He has quit using smokeless tobacco.  His smokeless tobacco use included chew. He reports that he does not drink alcohol and does not use drugs. family history includes CAD in his brother, brother, and sister; Cancer in his mother; Cancer (age of onset: 51) in his father; Heart attack in his  father; Heart disease in his brother, brother, father, and sister. Allergies  Allergen Reactions   Losartan  Nausea And Vomiting and Other (See Comments)    Elevated creatinine levels     Review of Systems  Constitutional:  Negative for chills and fever.  Respiratory:  Negative for shortness of breath.   Cardiovascular:  Negative for chest pain.  Gastrointestinal:  Negative for abdominal pain, nausea and vomiting.  Genitourinary:  Negative for  dysuria.      Objective:     BP (!) 154/60 (BP Location: Left Arm, Patient Position: Sitting, Cuff Size: Normal)   Pulse (!) 104   Temp 97.8 F (36.6 C) (Oral)   Wt 185 lb 11.2 oz (84.2 kg)   SpO2 97%   BMI 24.50 kg/m  BP Readings from Last 3 Encounters:  03/16/24 (!) 154/60  03/10/24 (!) 145/65  02/05/24 (!) 146/70   Wt Readings from Last 3 Encounters:  03/16/24 185 lb 11.2 oz (84.2 kg)  03/04/24 184 lb (83.5 kg)  02/05/24 181 lb (82.1 kg)      Physical Exam Vitals reviewed.  Constitutional:      General: He is not in acute distress.    Appearance: He is not ill-appearing.  Cardiovascular:     Rate and Rhythm: Normal rate.  Pulmonary:     Effort: Pulmonary effort is normal.     Breath sounds: Normal breath sounds.  Musculoskeletal:     Comments: Has some bilateral lower extremity edema left greater than right.  Has mild erythema left leg but no significant warmth.  No significant tenderness.  Skin:    Comments: No obvious skin ulcers.  Neurological:     Mental Status: He is alert.      No results found for any visits on 03/16/24.  Last CBC Lab Results  Component Value Date   WBC 5.6 03/08/2024   HGB 10.9 (L) 03/08/2024   HCT 33.4 (L) 03/08/2024   MCV 105.4 (H) 03/08/2024   MCH 34.4 (H) 03/08/2024   RDW 16.5 (H) 03/08/2024   PLT 189 03/08/2024   Last metabolic panel Lab Results  Component Value Date   GLUCOSE 99 03/08/2024   NA 140 03/08/2024   K 4.0 03/08/2024   CL 103 03/08/2024   CO2 25 03/08/2024   BUN 21 03/08/2024   CREATININE 1.32 (H) 03/08/2024   GFRNONAA 51 (L) 03/08/2024   CALCIUM 8.9 03/08/2024   PHOS 3.2 03/08/2024   PROT 5.1 (L) 03/07/2024   ALBUMIN 2.6 (L) 03/07/2024   LABGLOB 1.9 11/15/2020   AGRATIO 2.2 11/15/2020   BILITOT 0.7 03/07/2024   ALKPHOS 46 03/07/2024   AST 33 03/07/2024   ALT 18 03/07/2024   ANIONGAP 12 03/08/2024      The ASCVD Risk score (Arnett DK, et al., 2019) failed to calculate for the following  reasons:   The 2019 ASCVD risk score is only valid for ages 27 to 56    Assessment & Plan:   #1 recent cellulitis of left lower extremity.  Improving.  He has 1 more day of Keflex .  Watch closely for any increased erythema, swelling, warmth, or pain after coming off antibiotics.  Also watch for any fever or chills.  Elevate leg frequently. - Repeat CBC and CRP  #2 protein calorie malnutrition-recent albumin 2.6.  Had discussion regarding some ideas for protein supplementation.  They will consider protein shake such as boost, Premier protein, or Ensure.  Also suggested several food choices to help boost his protein intake  some.  #3 possible recent acute diverticulitis based on CT imaging.  Patient improved following Rocephin .  Follow-up for any recurrent abdominal pain.  #4 history of BPH.  He has some recent difficulties urinating intermittently spite of finasteride  and Flomax .  Avoid anticholinergics.  If symptoms progressing we will need to get back in to see urologist.   Return in about 3 months (around 06/16/2024).    Wolm Scarlet, MD

## 2024-03-16 NOTE — Patient Instructions (Signed)
 Consider nutrition/protein supplement such as Premier Protein, Boost, or Ensure  Elevate leg frequently  Follow up for any progressive redness, warmth, fever or other concerns.    Might consider over the counter probiotic such as Floristor.

## 2024-03-17 ENCOUNTER — Ambulatory Visit: Payer: Self-pay | Admitting: Family Medicine

## 2024-03-23 ENCOUNTER — Other Ambulatory Visit: Payer: Self-pay | Admitting: Family Medicine

## 2024-04-08 NOTE — Transitions of Care (Post Inpatient/ED Visit) (Unsigned)
  04/07/2024 late entry for 11:05 am  Name: [name] MRN: [id number] DOB: 1932-07-10  Received a call from patient's wife, Dakarai Mcglocklin on [facility] and HIPAA verified stating she was calling numbers and voicemails in her husband's phone that requested a call back (call was from July). Explained to her the reason for the call and she stated that they didn't need anything and all calls should go through her as he is no longer able to communicate his needs effectively.  She thanked this Clinical research associate for the call and states she was leery thinking it was 'spam'.  Richerd Fish, RN, BSN, CCM [facility], [facility] RN Care Manager Direct Dial: [phone]

## 2024-04-09 ENCOUNTER — Other Ambulatory Visit: Payer: Self-pay | Admitting: Family Medicine

## 2024-04-09 DIAGNOSIS — R600 Localized edema: Secondary | ICD-10-CM

## 2024-04-15 ENCOUNTER — Other Ambulatory Visit: Payer: Self-pay | Admitting: Family Medicine

## 2024-04-20 ENCOUNTER — Other Ambulatory Visit: Payer: Self-pay | Admitting: Cardiology

## 2024-04-28 ENCOUNTER — Other Ambulatory Visit: Payer: Self-pay | Admitting: Family Medicine

## 2024-04-28 DIAGNOSIS — R600 Localized edema: Secondary | ICD-10-CM

## 2024-05-12 ENCOUNTER — Ambulatory Visit: Payer: Self-pay

## 2024-05-12 NOTE — Telephone Encounter (Addendum)
 Spoke to wife, states that pt fell onto the sofa, no injuries nor concerns at this time.  Advised to call back should either of them have any questions/concerns.   Message from Franky GRADE sent at 05/12/2024 10:21 AM EDT  [facility] from [facility], Patient was seen yesterday for physical therapy. Reporting a fall a few days ago , no injuries. He was not with the patient during the call.

## 2024-05-12 NOTE — Telephone Encounter (Signed)
 Noted

## 2024-05-17 ENCOUNTER — Telehealth: Payer: Self-pay | Admitting: *Deleted

## 2024-05-17 NOTE — Telephone Encounter (Signed)
   Pre-operative Risk Assessment    Patient Name: [name]  DOB: [birth year] MRN: [id number]   Date of last office visit: 07/25/22 DR. TURNER Date of next office visit: 08/03/24 DR. TURNER    Request for Surgical Clearance    Procedure:  B/L L5-S1 TFESI  Date of Surgery:  Clearance 05/23/24 (STAT PER FORM)                               Surgeon:  DR. DAVE [facility] Surgeon's Group or Practice Name:  [facility] Phone number:  [phone] Fax number:  [phone]   Type of Clearance Requested:   - Medical  - Pharmacy:  Hold Apixaban  (Eliquis ) x 3 DAYS PRIOR AND RESUME THE DAY AFTER PROCEDURE   Type of Anesthesia:  Not Indicated   Additional requests/questions:    Bonney Niels Jest   05/17/2024, 3:11 PM

## 2024-05-17 NOTE — Telephone Encounter (Signed)
 Patient with diagnosis of Afib on Eliquis  for anticoagulation.    Procedure:  B/L L5-S1 TFESI   Date of Surgery:  Clearance 05/23/24 (STAT PER FORM)   CHA2DS2-VASc Score = 3   This indicates a 3.2% annual risk of stroke. The patient's score is based upon: CHF History: 0 HTN History: 1 Diabetes History: 0 Stroke History: 0 Vascular Disease History: 0 Age Score: 2 Gender Score: 0    CrCl 45 ml/min Platelet count 335 K  Patient has not had an Afib/aflutter ablation within the last 3 months or DCCV within the last 30 days  Per office protocol, patient can hold Eliquis  for 3 days prior to procedure.    Patient will not need bridging with Lovenox (enoxaparin) around procedure.  Recommend to keep Eliquis  2.5 mg bid on board. Based on his current weight (84kg), Scr (1.28) and age (21), Eliquis  5 mg bid would be appropriate but due to excessive bleeding due to skin tears and fluctuating Scr, will keep Eliquis  2.5 mg bid for now.   **This guidance is not considered finalized until pre-operative APP has relayed final recommendations.**

## 2024-05-17 NOTE — Telephone Encounter (Signed)
 Ok per Jackee Alberts, NP to use time slot 05/19/24 2:45 for preop clearance. I will update all parties involved pt has appt 05/19/24.

## 2024-05-17 NOTE — Telephone Encounter (Signed)
 I s/w the pt and his wife. Pt will need appt in office as he has not been seen in 2 yrs. I reached out to Jackee Alberts, NP for the ok to schedule pt with him tomorrow. While I was discussing with NP, I discovered the time slot in question was taken. I have now reached back out to NP about 05/19/24 2:45 time slot if available to offer to the pt. We just received the request today for 05/23/24. I will update the requesting office that we are trying to get the pt in w/o delay to their procedure.   If we cannot get the pt in in time he will need to post pone procedure until seen by cardiology as it has been almost 2 yrs.

## 2024-05-17 NOTE — Telephone Encounter (Signed)
   Name: SAYLOR SHECKLER  DOB: Sep 21, 1931  MRN: [id number]  Primary Cardiologist: Wilbert Bihari, MD  Chart reviewed as part of pre-operative protocol coverage. Because of Jaquae Rieves Lumpkin's past medical history and time since last visit, he will require a follow-up in-office visit in order to better assess preoperative cardiovascular risk.  Patient has not been seen in office in greater than 1 year.  Pre-op covering staff: - Please schedule appointment and call patient to inform them. If patient already had an upcoming appointment within acceptable timeframe, please add pre-op clearance to the appointment notes so provider is aware. - Please contact requesting surgeon's office via preferred method (i.e, phone, fax) to inform them of need for appointment prior to surgery.  This message will also be routed to pharmacy pool for input on holding Eliquis  as requested below so that this information is available to the clearing provider at time of patient's appointment.   Damien JAYSON Braver, NP  05/17/2024, 3:31 PM

## 2024-05-18 NOTE — Progress Notes (Unsigned)
 Cardiology Office Note    Patient Name: [name] Date of Encounter: 05/18/2024  Primary Care Provider:  [name], MD Primary Cardiologist:  Wilbert Bihari, MD Primary Electrophysiologist: None   Past Medical History    Past Medical History:  Diagnosis Date   Actinic keratosis 03/13/2009   Arthritis    BENIGN PROSTATIC HYPERTROPHY, HX OF 04/07/2009   CARCINOMA, SKIN, SQUAMOUS CELL 09/11/2009   COLONIC POLYPS, HX OF 03/13/2009   Essential hypertension    GERD 03/13/2009   Hyperlipidemia    INSOMNIA, CHRONIC 09/11/2009   Persistent atrial fibrillation (HCC) 04/07/2009   a. s/p afib ablation at [facility] x 2 in 2010.   POLYPECTOMY, HX OF 04/07/2009   Premature atrial contractions    PULMONARY NODULE 09/11/2009   PVC's (premature ventricular contractions)    Rosacea 03/13/2009   SKIN CANCER, HX OF 03/13/2009    History of Present Illness  [name] is a 89 y/o male with a h/o  persistent atrial fibrillation s/p afib ablation at [facility] x 2 in 2010, HTN, hyperlipidemia, PACs/PVCs (by monitor 2017), CKD stage III  who is pending bilateral transforaminal ESI and presents today for telephonic preoperative cardiovascular risk assessment.   [name] has been followed by Dr. Bihari since 2017 for management of atrial fibrillation.  He completed a Lexiscan  Myoview  in 2020 for complaint of chest pain that was low risk and normal.  He had 2D echo completed that showed EF of 55 to 60% with no RWMA.  He was admitted in 05/2023 with sepsis due to pneumonia and was treated with antibiotics and underwent a TEE due to diagnosis of MSSA bacteremia that showed no vegetation or significant findings.  He presented to the ED in 02/2024 with fever and chills with nausea and vomiting with CT scan of the abdomen showing no acute diverticulitis.  He was found to have sepsis with lower extremity cellulitis and was treated with antibiotics.  [name] presents today with his wife for preop  clearance. He has fluctuating blood pressure readings at home, with systolic values ranging from the 120s to the 140s. He denies chest pain and palpitations. He reports some shortness of breath, which he attributes more to his lung condition than to his heart. He attributes his shortness of breath more to his lung condition rather than cardiac issues. He uses an inhaler twice daily as a regular treatment. He has a history of atrial fibrillation, previously requiring two ablations in 2017 to manage episodes where his heart rate escalated to 220-230 bpm. Currently, he experiences very short bursts of atrial fibrillation. He is on Eliquis , which he holds three days prior to certain procedures. He experiences significant back pain and weakness, severely limiting his mobility for the past five to six years. He is unable to walk long distances or climb stairs, necessitating the installation of a lift in his home. He uses a walker for mobility and has a caregiver to assist with daily activities. He reports balance issues and has experienced several falls post-hospitalization. He has a history of sepsis and pneumonia treated in October of a previous year, and another episode of sepsis due to cellulitis in July. He experiences regular swelling in his ankles, which improves with Lasix , although he did not take it on the morning of the visit. Patient denies chest pain, palpitations, dyspnea, PND, orthopnea, nausea, vomiting, dizziness, syncope, edema, weight gain, or early satiety.  Discussed the use of AI scribe software for clinical note transcription with  the patient, who gave verbal consent to proceed.  History of Present Illness   Review of Systems  Please see the history of present illness.    All other systems reviewed and are otherwise negative except as noted above.  Physical Exam    Wt Readings from Last 3 Encounters:  03/16/24 185 lb 11.2 oz (84.2 kg)  03/04/24 184 lb (83.5 kg)  02/05/24 181 lb  (82.1 kg)   CD:Uyzmz were no vitals filed for this visit.,There is no height or weight on file to calculate BMI. GEN: Well nourished, well developed in no acute distress Neck: No JVD; No carotid bruits Pulmonary: Clear to auscultation without rales, wheezing or rhonchi  Cardiovascular: Normal rate. Regular rhythm. Normal S1. Normal S2.   Murmurs: There is no murmur.  ABDOMEN: Soft, non-tender, non-distended EXTREMITIES: Trace bilateral pitting edema  EKG/LABS/ Recent Cardiac Studies   ECG personally reviewed by me today -sinus rhythm with rate of 84 bpm RVH with no acute changes consistent with previous EKG.  Risk Assessment/Calculations:    CHA2DS2-VASc Score = 3   This indicates a 3.2% annual risk of stroke. The patient's score is based upon: CHF History: 0 HTN History: 1 Diabetes History: 0 Stroke History: 0 Vascular Disease History: 0 Age Score: 2 Gender Score: 0         Lab Results  Component Value Date   WBC 10.7 (H) 03/16/2024   HGB 12.0 (L) 03/16/2024   HCT 37.5 (L) 03/16/2024   MCV 104.3 (H) 03/16/2024   PLT 335.0 03/16/2024   Lab Results  Component Value Date   CREATININE 1.28 03/16/2024   BUN 21 03/16/2024   NA 139 03/16/2024   K 4.2 03/16/2024   CL 103 03/16/2024   CO2 26 03/16/2024   Lab Results  Component Value Date   CHOL 116 02/03/2023   HDL 30.60 (L) 02/03/2023   LDLCALC 53 02/03/2023   LDLDIRECT 91.0 09/19/2020   TRIG 165.0 (H) 02/03/2023   CHOLHDL 4 02/03/2023    Lab Results  Component Value Date   HGBA1C 6.3 (H) 02/05/2024   Assessment & Plan    Assessment and Plan Assessment & Plan Preprocedural cardiovascular evaluation for planned steroid injections No new cardiac complaints. EKG shows sinus rhythm. Procedure is low risk for cardiac complications. Activity level at three metabolic equivalents due to non-cardiac issues. - Clear for steroid injection procedure. - Hold Eliquis  three days prior to procedure. - Send clearance  letter to referring doctor.  Paroxysmal atrial fibrillation Paroxysmal atrial fibrillation with occasional short bursts. History of two ablations in 2017.  Essential hypertension Blood pressure fluctuates between 120s and 140s systolic. No new symptoms.  Lower extremity edema Chronic edema, typically clear in mornings. Lasix  not taken on morning of visit, contributing to swelling. - Continue Lasix  as prescribed.  1.  Preop clearance: - Patient's RCRI score is 0.4% The patient affirms he has been doing well without any new cardiac symptoms. They are able to achieve 4 METS without cardiac limitations. Therefore, based on ACC/AHA guidelines, the patient would be at acceptable risk for the planned procedure without further cardiovascular testing. The patient was advised that if he develops new symptoms prior to surgery to contact our office to arrange for a follow-up visit, and he verbalized understanding.   Patient can hold Eliquis  3 days prior to procedure and should restart postprocedure when surgically safe and pneumostasis is achieved.  2.  Paroxysmal AF: -s/p previous AF ablations and currently rate controlled at 84  bpm. -Patient's creatinine is 1.28 and hemoglobin was 12.0 - Continue Eliquis  2.5 mg twice daily - Continue rate control with Toprol -XL 50 mg daily   3.  Essential hypertension: - Patient's blood pressure today was 144/68 Blood pressure fluctuates between 120s and 140s systolic. No new symptoms. - Continue Toprol -XL 50 mg amlodipine  2.5 mg daily  4.  Hyperlipidemia: - Patient's last LDL cholesterol was 53 - Continue lovastatin  20 mg daily  5. Lower extremity edema: Chronic edema, typically clear in mornings. Lasix  not taken on morning of visit, contributing to swelling. - Continue Lasix  as prescribed.  Disposition: Follow-up with Wilbert Bihari, MD as scheduled  Signed, Wyn Raddle, Jackee Shove, NP 05/18/2024, 7:11 PM [name]

## 2024-05-19 ENCOUNTER — Encounter: Payer: Self-pay | Admitting: Nurse Practitioner

## 2024-05-19 ENCOUNTER — Ambulatory Visit: Attending: Nurse Practitioner | Admitting: Nurse Practitioner

## 2024-05-19 VITALS — BP 144/68 | HR 84 | Ht 73.0 in | Wt 185.6 lb

## 2024-05-19 DIAGNOSIS — I10 Essential (primary) hypertension: Secondary | ICD-10-CM | POA: Diagnosis not present

## 2024-05-19 DIAGNOSIS — I480 Paroxysmal atrial fibrillation: Secondary | ICD-10-CM

## 2024-05-19 DIAGNOSIS — Z01810 Encounter for preprocedural cardiovascular examination: Secondary | ICD-10-CM

## 2024-05-19 DIAGNOSIS — E7800 Pure hypercholesterolemia, unspecified: Secondary | ICD-10-CM

## 2024-05-19 DIAGNOSIS — R600 Localized edema: Secondary | ICD-10-CM

## 2024-05-19 NOTE — Patient Instructions (Signed)
 Medication Instructions:  Your physician recommends that you continue on your current medications as directed. Please refer to the Current Medication list given to you today.  *If you need a refill on your cardiac medications before your next appointment, please call your pharmacy*  Lab Work: None ordered  If you have labs (blood work) drawn today and your tests are completely normal, you will receive your results only by: MyChart Message (if you have MyChart) OR A paper copy in the mail If you have any lab test that is abnormal or we need to change your treatment, we will call you to review the results.  Testing/Procedures: None ordered  Follow-Up: At [facility], you and your health needs are our priority.  As part of our continuing mission to provide you with exceptional heart care, our providers are all part of one team.  This team includes your primary Cardiologist (physician) and Advanced Practice Providers or APPs (Physician Assistants and Nurse Practitioners) who all work together to provide you with the care you need, when you need it.  Your next appointment:   As scheduled   Provider:   Wilbert Bihari, MD    We recommend signing up for the patient portal called MyChart.  Sign up information is provided on this After Visit Summary.  MyChart is used to connect with patients for Virtual Visits (Telemedicine).  Patients are able to view lab/test results, encounter notes, upcoming appointments, etc.  Non-urgent messages can be sent to your provider as well.   To learn more about what you can do with MyChart, go to [email or web address].   Other Instructions

## 2024-07-07 NOTE — Telephone Encounter (Signed)
 Call to patient to see if he currently has enough medication and if he can make it to follow up appointment scheduled in December. Second attempt. No answer, left VM with no identifiers asking recipient to call Corley at our office #.

## 2024-07-08 ENCOUNTER — Telehealth: Payer: Self-pay | Admitting: Cardiology

## 2024-07-08 DIAGNOSIS — Z79899 Other long term (current) drug therapy: Secondary | ICD-10-CM

## 2024-07-08 NOTE — Telephone Encounter (Signed)
 Spoke to wife. She states received a phone call  that patient need lab.  Bmp reorder , initial order expired   Wife states it is difficulty getting patient out. She wil do the lab the same day as his appt with Dr Shlomo.  RN recommended patient go to the appt and do blood work after , just in case there is more labs to do.   Wife Verbalized understanding.

## 2024-07-08 NOTE — Telephone Encounter (Signed)
 Current BMET order expires on 11/18. Wife would like to know if new order can be placed so patient can have labs drawn during appointment on 12/10 with Dr. Shlomo. Please advise.

## 2024-07-11 ENCOUNTER — Ambulatory Visit: Payer: Self-pay

## 2024-07-11 NOTE — Telephone Encounter (Signed)
 FYI Only or Action Required?: FYI only for provider: seen at urgent care, home care advice given and ED precautions.  Patient was last seen in primary care on 03/16/2024 by Micheal Wolm ORN, MD.  Called Nurse Triage reporting Fall and Open Wound.  Symptoms began Friday night.  Interventions attempted: Other: pressure held, non adherent and gauze bandages; seen at urgent care today (pressure applied, strips, bandage).  Symptoms are: right arm skin tear gradually improving. Bruising and swelling to right arm gradually worsening.  Triage Disposition: Home Care (overriding See Physician Within 24 Hours)  Patient/caregiver understands and will follow disposition?: Yes        Reason for Disposition  A quarter (25%) or more of the flap is missing (completely torn off)  Answer Assessment - Initial Assessment Questions 1. DESCRIPTION: What does the injury look like?  Is there any part of the skin missing?  If yes, How much of the skin flap is missing?  (25%, 50%, 75%, all)     Wife states about 50% is missing.  2. SIZE: How large is the skin tear?     3-4 inches.  3. BLEEDING: Is it bleeding now? If Yes, ask: Is it difficult to stop?     Yes. Difficulty stopping bleeding. She states the current dressing is clean, has not bled through and not actively bleeding or dripping blood.  4. LOCATION: Where is the skin tear located?     Right arm.  5. ONSET: How long ago did the skin tear occur?     Friday 7-7:30pm.  6. MECHANISM: Tell me how it happened.     Fall. Wife states he was in the bathroom and she thinks he lost his balance and fell. Denies any other injuries from the fall, she states there is some swelling and bruising on the right arm.  Patient is on Eliquis . Wife states she took patient to urgent care this morning at 09:15 and they applied some strips that is supposed to help slow the bleeding and clot it and was bandaged, applied pressure. She was advised by  urgent care that if the bleeding did not stop to take him to the ED.  Protocols used: Skin Tear-A-AH

## 2024-07-11 NOTE — Telephone Encounter (Signed)
 Appt scheduled

## 2024-07-11 NOTE — Telephone Encounter (Signed)
 Pt disconnected prior to NT warm transfer. LVMTCB.   Copied from CRM #[id number]. Topic: Clinical - Red Word Triage >> Jul 11, 2024  8:01 AM Mercedes MATSU wrote: Red Word that prompted transfer to Nurse Triage: Patients wife Isaac Dubie called in stating that the patient has a wound on his arm that is bleeding, it looks as if there is a blood clot and she wants to know what she can do to stop the bleeding. She states the patient is in a great deal of pain, he just had a minor surgery to remove something from his skin as well.

## 2024-07-12 ENCOUNTER — Emergency Department (HOSPITAL_BASED_OUTPATIENT_CLINIC_OR_DEPARTMENT_OTHER)

## 2024-07-12 ENCOUNTER — Encounter (HOSPITAL_BASED_OUTPATIENT_CLINIC_OR_DEPARTMENT_OTHER): Payer: Self-pay

## 2024-07-12 ENCOUNTER — Emergency Department (HOSPITAL_BASED_OUTPATIENT_CLINIC_OR_DEPARTMENT_OTHER)
Admission: EM | Admit: 2024-07-12 | Discharge: 2024-07-12 | Disposition: A | Discharge status: Home / Self Care | Source: Home / Self Care | Attending: Emergency Medicine | Admitting: Emergency Medicine

## 2024-07-12 ENCOUNTER — Other Ambulatory Visit: Payer: Self-pay

## 2024-07-12 DIAGNOSIS — I4891 Unspecified atrial fibrillation: Secondary | ICD-10-CM | POA: Insufficient documentation

## 2024-07-12 DIAGNOSIS — Z79899 Other long term (current) drug therapy: Secondary | ICD-10-CM | POA: Insufficient documentation

## 2024-07-12 DIAGNOSIS — I10 Essential (primary) hypertension: Secondary | ICD-10-CM | POA: Insufficient documentation

## 2024-07-12 DIAGNOSIS — Z23 Encounter for immunization: Secondary | ICD-10-CM | POA: Insufficient documentation

## 2024-07-12 DIAGNOSIS — S51011A Laceration without foreign body of right elbow, initial encounter: Secondary | ICD-10-CM | POA: Insufficient documentation

## 2024-07-12 DIAGNOSIS — Y92002 Bathroom of unspecified non-institutional (private) residence single-family (private) house as the place of occurrence of the external cause: Secondary | ICD-10-CM | POA: Insufficient documentation

## 2024-07-12 DIAGNOSIS — Z7901 Long term (current) use of anticoagulants: Secondary | ICD-10-CM | POA: Insufficient documentation

## 2024-07-12 DIAGNOSIS — W01198A Fall on same level from slipping, tripping and stumbling with subsequent striking against other object, initial encounter: Secondary | ICD-10-CM | POA: Insufficient documentation

## 2024-07-12 DIAGNOSIS — L0390 Cellulitis, unspecified: Secondary | ICD-10-CM | POA: Diagnosis not present

## 2024-07-12 DIAGNOSIS — A411 Sepsis due to other specified staphylococcus: Secondary | ICD-10-CM | POA: Diagnosis not present

## 2024-07-12 DIAGNOSIS — S41101A Unspecified open wound of right upper arm, initial encounter: Secondary | ICD-10-CM

## 2024-07-12 MED ORDER — LIDOCAINE-EPINEPHRINE (PF) 2 %-1:200000 IJ SOLN
10.0000 mL | Freq: Once | INTRAMUSCULAR | Status: AC
  Administered 2024-07-12: 10 mL
  Filled 2024-07-12: qty 20

## 2024-07-12 MED ORDER — TETANUS-DIPHTH-ACELL PERTUSSIS 5-2-15.5 LF-MCG/0.5 IM SUSP
0.5000 mL | Freq: Once | INTRAMUSCULAR | Status: AC
  Administered 2024-07-12: 0.5 mL via INTRAMUSCULAR
  Filled 2024-07-12: qty 0.5

## 2024-07-12 NOTE — ED Triage Notes (Signed)
 Pt reports fall on Friday, did not hit head. Fell on R side, hitting R elbow. Significant skin tear, was seen at [facility] yesterday, and told to come to ER if bleeding continued. Wife reports she wrapped elbow with gauze ~20-30 mins PTA and bleeding is visible through dressing in triage. Pulses intact +2 bilaterally. Non-pitting edema and discoloration noted to right forearm and hand. Skin warm, dry

## 2024-07-12 NOTE — Discharge Instructions (Addendum)
 It was a pleasure taking care of you today. As discussed, have your wound rechecked by PCP in 2 days. You may take over the counter tylenol  as needed for pain. Return to the ER for new or worsening symptoms.

## 2024-07-12 NOTE — ED Provider Notes (Signed)
 [name] EMERGENCY DEPARTMENT AT [facility] Provider Note   CSN: [id number] Arrival date & time: 07/12/24  1422     Patient presents with: Fall (On Thinners)   [name] is a 89 y.o. male with a past medical history significant for persistent A-fib on Eliquis , hyperlipidemia, and hypertension who presents to the ED due to skin tear to right elbow.  Patient notes he fell and hit his arm against a bathroom vanity 4 days ago.  He was seen at urgent care yesterday where surgifoam was applied to right elbow with no further bleeding.  Wife at bedside notes that patient has been bleeding through his bandage.  Bandage change prior to arrival.  Patient admits to pain around elbow that radiates to right hand. Has edema to right hand. No numbness/tingling. Denies weakness. No other injuries. Denies head injury/LOC.  History obtained from patient and past medical records. No interpreter used during encounter.        Prior to Admission medications   Medication Sig Start Date End Date Taking? Authorizing Provider  acetaminophen  (TYLENOL ) 500 MG tablet Take 500 mg by mouth every 6 (six) hours as needed for mild pain.    [provider]  amLODipine  (NORVASC ) 2.5 MG tablet TAKE 1 TABLET DAILY 02/24/24   Holder, Steven LELON, MD  apixaban  (ELIQUIS ) 2.5 MG TABS tablet Take 1 tablet (2.5 mg total) by mouth 2 (two) times daily. 02/11/24   Shlomo Wilbert SAUNDERS, MD  budesonide -formoterol  (BREYNA ) 80-4.5 MCG/ACT inhaler Inhale 2 puffs into the lungs in the morning and at bedtime. 07/03/23   Holder, Steven LELON, MD  finasteride  (PROSCAR ) 5 MG tablet TAKE 1 TABLET (5 MG TOTAL) BY MOUTH DAILY. 02/24/24   Holder, Steven LELON, MD  furosemide  (LASIX ) 40 MG tablet TAKE 1 TABLET EVERY DAY AS NEEDED FOR LEG SWELLING 04/28/24   Holder, Steven LELON, MD  gabapentin  (NEURONTIN ) 100 MG capsule Take 100 mg by mouth 2 (two) times daily. 02/08/24   [provider]  glucosamine-chondroitin 500-400 MG  tablet Take 2 tablets by mouth daily.    [provider]  lansoprazole  (PREVACID ) 30 MG capsule TAKE 1 CAPSULE EVERY DAY AT 12 NOON. Patient taking differently: Take 30 mg by mouth daily at 12 noon. 01/19/24   Shlomo Wilbert SAUNDERS, MD  lovastatin  (MEVACOR ) 20 MG tablet TAKE 1 TABLET BY MOUTH EVERYDAY AT BEDTIME 04/28/24   Holder, Steven LELON, MD  metoprolol  succinate (TOPROL -XL) 50 MG 24 hr tablet TAKE 1 TABLET BY MOUTH EVERY DAY WITH OR IMMEDIATELY FOLLOWING A MEAL Patient taking differently: Take 50 mg by mouth daily. 02/11/24   Shlomo Wilbert SAUNDERS, MD  potassium chloride  (KLOR-CON ) 10 MEQ tablet TAKE 2 TABLETS BY MOUTH EVERY DAY 09/28/23   Holder, Steven LELON, MD  tamsulosin  (FLOMAX ) 0.4 MG CAPS capsule Take 1 capsule (0.4 mg total) by mouth daily. Patient taking differently: Take 0.4 mg by mouth in the morning and at bedtime. 09/06/21   Holder, Steven LELON, MD  vitamin B-12 (CYANOCOBALAMIN ) 500 MCG tablet Take 500 mcg by mouth daily.    [provider]  VITAMIN D PO Take 400 Units by mouth daily.     [provider]    Allergies: Losartan     Review of Systems  Musculoskeletal:  Positive for arthralgias.  Skin:  Positive for wound.    Updated Vital Signs BP (!) 155/71 (BP Location: Left Arm)   Pulse 88   Temp 97.9 F (36.6 C)   Resp 18  Ht 6' 1 (1.854 m)   Wt 83.9 kg   SpO2 100%   BMI 24.41 kg/m   Physical Exam Vitals and nursing note reviewed.  Constitutional:      General: He is not in acute distress.    Appearance: He is not ill-appearing.  HENT:     Head: Normocephalic.  Eyes:     Pupils: Pupils are equal, round, and reactive to light.  Cardiovascular:     Rate and Rhythm: Normal rate and regular rhythm.     Pulses: Normal pulses.     Heart sounds: Normal heart sounds. No murmur heard.    No friction rub. No gallop.  Pulmonary:     Effort: Pulmonary effort is normal.     Breath sounds: Normal breath sounds.  Abdominal:     General: Abdomen is flat.  There is no distension.     Palpations: Abdomen is soft.     Tenderness: There is no abdominal tenderness. There is no guarding or rebound.  Musculoskeletal:        General: Normal range of motion.     Cervical back: Neck supple.     Comments: Tenderness throughout elbow. Soft compartments. Edema to right hand. Radial pulse intact.   Skin:    General: Skin is warm and dry.     Comments: Skin tear to right elbow. See photo below.   Neurological:     General: No focal deficit present.     Mental Status: He is alert.  Psychiatric:        Mood and Affect: Mood normal.        Behavior: Behavior normal.     (all labs ordered are listed, but only abnormal results are displayed) Labs Reviewed - No data to display  EKG: None  Radiology: DG Hand Complete Right Result Date: 07/12/2024 EXAM: 3 OR MORE VIEW(S) XRAY OF THE RIGHT HAND 07/12/2024 03:34:00 PM COMPARISON: None available. CLINICAL HISTORY: Injury FINDINGS: BONES AND JOINTS: No acute fracture. No focal osseous lesion. No joint dislocation. SOFT TISSUES: The soft tissues are unremarkable. IMPRESSION: 1. No acute osseous abnormality. Electronically signed by: Lynwood Seip MD 07/12/2024 03:55 PM EST RP Workstation: [id number]   DG Forearm Right Result Date: 07/12/2024 EXAM: VIEW(S) XRAY OF THE RIGHT FOREARM 07/12/2024 03:34:00 PM COMPARISON: None available. CLINICAL HISTORY: injury FINDINGS: FINDINGS: BONES AND JOINTS: Possible minimally displaced fracture involving olecranon spur of indeterminate age. No focal osseous lesion. No joint dislocation. SOFT TISSUES: The soft tissues are unremarkable. IMPRESSION: 1. Possible minimally displaced fracture involving the right olecranon spur, age indeterminate. Electronically signed by: Lynwood Seip MD 07/12/2024 03:53 PM EST RP Workstation: [id number]   DG Elbow Complete Right Result Date: 07/12/2024 EXAM: 3 VIEW(S) XRAY OF THE RIGHT ELBOW COMPARISON: None available. CLINICAL HISTORY: injury  FINDINGS: BONES AND JOINTS: Possible minimally displaced fracture involving olecranon spur of indeterminate age. No focal osseous lesion. No joint dislocation. No joint effusion. SOFT TISSUES: The soft tissues are unremarkable. IMPRESSION: 1. Possible minimally displaced fracture of the olecranon spur, age indeterminate. Electronically signed by: Lynwood Seip MD 07/12/2024 03:52 PM EST RP Workstation: [id number]     Procedures   Medications Ordered in the ED  lidocaine -EPINEPHrine  (XYLOCAINE  W/EPI) 2 %-1:200000 (PF) injection 10 mL (10 mLs Infiltration Given 07/12/24 1541)  Tdap (ADACEL) injection 0.5 mL (0.5 mLs Intramuscular Given 07/12/24 1541)  Medical Decision Making Amount and/or Complexity of Data Reviewed Independent Historian: spouse External Data Reviewed: notes.    Details: UC note Radiology: ordered and independent interpretation performed. Decision-making details documented in ED Course.  Risk Prescription drug management.   This patient presents to the ED for concern of wound, this involves an extensive number of treatment options, and is a complaint that carries with it a high risk of complications and morbidity.  The differential diagnosis includes laceration, bony fracture, open fracture, etc  89 year old male presents to the ED due to skin tear to right elbow that occurred 4 days ago.  Patient lost his balance in the bathroom and hit his right elbow on a bathroom vanity.  Currently on Eliquis  for A-fib.  No head injury or LOC.  Was seen at urgent care yesterday where his wound was bandaged however, wife notes he continues to bleed.  No other injuries.  Upon arrival, stable vitals.  Patient well-appearing on exam.  Does have large skin tear to lateral aspect of right elbow.  See photo above. Tenderness throughout right elbow.  Right upper extremity neurovascularly intact with soft compartments.  Low suspicion for compartment syndrome.  Does  have edema to right hand with slight tenderness.  X-rays ordered to rule out bony fracture.   X-rays personally reviewed and interpreted which demonstrates a possible minimally displaced fracture involving the right olecranon spur.  Location of possible fracture does not coincide with wound.  Low suspicion for open fracture. Wound thoroughly cleaned. Area numbed with lidocaine  + epinephrine  and quick clot powder placed on wound.  bulky dressing placed over wound. Hemostasis achieved. No further bleeding in the ED. Advised patient to have wound recheck in 2 days by PCP or return for further bleed. Patient stable for discharge. Strict ED precautions discussed with patient. Patient states understanding and agrees to plan. Patient discharged home in no acute distress and stable vitals  Co morbidities that complicate the patient evaluation  HTN, HL  Social Determinants of Health:  Elderly >65     Final diagnoses:  Arm wound, right, initial encounter    ED Discharge Orders     None          Lorelle Aleck JAYSON DEVONNA 07/12/24 1648    Geraldene Hamilton, MD 07/14/24 1029

## 2024-07-13 ENCOUNTER — Inpatient Hospital Stay (HOSPITAL_COMMUNITY)
Admission: EM | Admit: 2024-07-13 | Discharge: 2024-07-18 | DRG: 871 | Disposition: A | Discharge status: Home / Self Care | Attending: Family Medicine | Admitting: Family Medicine

## 2024-07-13 ENCOUNTER — Ambulatory Visit: Admitting: Family Medicine

## 2024-07-13 ENCOUNTER — Emergency Department (HOSPITAL_COMMUNITY)

## 2024-07-13 ENCOUNTER — Encounter (HOSPITAL_COMMUNITY): Payer: Self-pay

## 2024-07-13 ENCOUNTER — Other Ambulatory Visit (HOSPITAL_COMMUNITY): Payer: Self-pay

## 2024-07-13 DIAGNOSIS — I4821 Permanent atrial fibrillation: Secondary | ICD-10-CM | POA: Diagnosis present

## 2024-07-13 DIAGNOSIS — W19XXXA Unspecified fall, initial encounter: Secondary | ICD-10-CM | POA: Diagnosis present

## 2024-07-13 DIAGNOSIS — Z1152 Encounter for screening for COVID-19: Secondary | ICD-10-CM

## 2024-07-13 DIAGNOSIS — Z79899 Other long term (current) drug therapy: Secondary | ICD-10-CM

## 2024-07-13 DIAGNOSIS — L03113 Cellulitis of right upper limb: Principal | ICD-10-CM | POA: Diagnosis present

## 2024-07-13 DIAGNOSIS — Z7901 Long term (current) use of anticoagulants: Secondary | ICD-10-CM

## 2024-07-13 DIAGNOSIS — N179 Acute kidney failure, unspecified: Secondary | ICD-10-CM | POA: Diagnosis present

## 2024-07-13 DIAGNOSIS — Z8619 Personal history of other infectious and parasitic diseases: Secondary | ICD-10-CM

## 2024-07-13 DIAGNOSIS — L0390 Cellulitis, unspecified: Secondary | ICD-10-CM | POA: Diagnosis present

## 2024-07-13 DIAGNOSIS — I130 Hypertensive heart and chronic kidney disease with heart failure and stage 1 through stage 4 chronic kidney disease, or unspecified chronic kidney disease: Secondary | ICD-10-CM | POA: Diagnosis present

## 2024-07-13 DIAGNOSIS — K219 Gastro-esophageal reflux disease without esophagitis: Secondary | ICD-10-CM | POA: Diagnosis present

## 2024-07-13 DIAGNOSIS — R600 Localized edema: Secondary | ICD-10-CM

## 2024-07-13 DIAGNOSIS — Z85828 Personal history of other malignant neoplasm of skin: Secondary | ICD-10-CM

## 2024-07-13 DIAGNOSIS — F03A0 Unspecified dementia, mild, without behavioral disturbance, psychotic disturbance, mood disturbance, and anxiety: Secondary | ICD-10-CM | POA: Diagnosis present

## 2024-07-13 DIAGNOSIS — Z23 Encounter for immunization: Secondary | ICD-10-CM

## 2024-07-13 DIAGNOSIS — R739 Hyperglycemia, unspecified: Secondary | ICD-10-CM

## 2024-07-13 DIAGNOSIS — E785 Hyperlipidemia, unspecified: Secondary | ICD-10-CM | POA: Diagnosis present

## 2024-07-13 DIAGNOSIS — N400 Enlarged prostate without lower urinary tract symptoms: Secondary | ICD-10-CM | POA: Diagnosis present

## 2024-07-13 DIAGNOSIS — Z8249 Family history of ischemic heart disease and other diseases of the circulatory system: Secondary | ICD-10-CM

## 2024-07-13 DIAGNOSIS — I482 Chronic atrial fibrillation, unspecified: Secondary | ICD-10-CM

## 2024-07-13 DIAGNOSIS — A411 Sepsis due to other specified staphylococcus: Principal | ICD-10-CM | POA: Diagnosis present

## 2024-07-13 DIAGNOSIS — Z888 Allergy status to other drugs, medicaments and biological substances status: Secondary | ICD-10-CM

## 2024-07-13 DIAGNOSIS — D539 Nutritional anemia, unspecified: Secondary | ICD-10-CM

## 2024-07-13 DIAGNOSIS — Z7951 Long term (current) use of inhaled steroids: Secondary | ICD-10-CM

## 2024-07-13 DIAGNOSIS — S41111A Laceration without foreign body of right upper arm, initial encounter: Secondary | ICD-10-CM | POA: Diagnosis present

## 2024-07-13 DIAGNOSIS — Z87891 Personal history of nicotine dependence: Secondary | ICD-10-CM

## 2024-07-13 DIAGNOSIS — G9341 Metabolic encephalopathy: Secondary | ICD-10-CM | POA: Diagnosis present

## 2024-07-13 DIAGNOSIS — N1831 Chronic kidney disease, stage 3a: Secondary | ICD-10-CM | POA: Diagnosis present

## 2024-07-13 DIAGNOSIS — R262 Difficulty in walking, not elsewhere classified: Secondary | ICD-10-CM | POA: Diagnosis present

## 2024-07-13 DIAGNOSIS — R7881 Bacteremia: Secondary | ICD-10-CM | POA: Diagnosis present

## 2024-07-13 DIAGNOSIS — M545 Low back pain, unspecified: Secondary | ICD-10-CM | POA: Diagnosis present

## 2024-07-13 LAB — COMPREHENSIVE METABOLIC PANEL WITH GFR
ALT: 9 U/L (ref 0–44)
AST: 20 U/L (ref 15–41)
Albumin: 3.7 g/dL (ref 3.5–5.0)
Alkaline Phosphatase: 56 U/L (ref 38–126)
Anion gap: 14 (ref 5–15)
BUN: 28 mg/dL — ABNORMAL HIGH (ref 8–23)
CO2: 23 mmol/L (ref 22–32)
Calcium: 9.3 mg/dL (ref 8.9–10.3)
Chloride: 104 mmol/L (ref 98–111)
Creatinine, Ser: 1.7 mg/dL — ABNORMAL HIGH (ref 0.61–1.24)
GFR, Estimated: 38 mL/min — ABNORMAL LOW (ref >60)
Glucose, Bld: 131 mg/dL — ABNORMAL HIGH (ref 70–99)
Potassium: 4.2 mmol/L (ref 3.5–5.1)
Sodium: 141 mmol/L (ref 135–145)
Total Bilirubin: 1.2 mg/dL (ref 0.0–1.2)
Total Protein: 6.3 g/dL — ABNORMAL LOW (ref 6.5–8.1)

## 2024-07-13 LAB — URINALYSIS, W/ REFLEX TO CULTURE (INFECTION SUSPECTED)
Bacteria, UA: NONE SEEN
Bilirubin Urine: NEGATIVE
Glucose, UA: NEGATIVE mg/dL
Hgb urine dipstick: NEGATIVE
Ketones, ur: NEGATIVE mg/dL
Leukocytes,Ua: NEGATIVE
Nitrite: NEGATIVE
Protein, ur: NEGATIVE mg/dL
Specific Gravity, Urine: 1.018 (ref 1.005–1.030)
pH: 5 (ref 5.0–8.0)

## 2024-07-13 LAB — CBC WITH DIFFERENTIAL/PLATELET
Abs Immature Granulocytes: 0.19 K/uL — ABNORMAL HIGH (ref 0.00–0.07)
Basophils Absolute: 0 K/uL (ref 0.0–0.1)
Basophils Relative: 0 %
Eosinophils Absolute: 0 K/uL (ref 0.0–0.5)
Eosinophils Relative: 0 %
HCT: 35.9 % — ABNORMAL LOW (ref 39.0–52.0)
Hemoglobin: 11.6 g/dL — ABNORMAL LOW (ref 13.0–17.0)
Immature Granulocytes: 2 %
Lymphocytes Relative: 8 %
Lymphs Abs: 0.8 K/uL (ref 0.7–4.0)
MCH: 33.4 pg (ref 26.0–34.0)
MCHC: 32.3 g/dL (ref 30.0–36.0)
MCV: 103.5 fL — ABNORMAL HIGH (ref 80.0–100.0)
Monocytes Absolute: 1.2 K/uL — ABNORMAL HIGH (ref 0.1–1.0)
Monocytes Relative: 12 %
Neutro Abs: 7.7 K/uL (ref 1.7–7.7)
Neutrophils Relative %: 78 %
Platelets: 223 K/uL (ref 150–400)
RBC: 3.47 MIL/uL — ABNORMAL LOW (ref 4.22–5.81)
RDW: 15.9 % — ABNORMAL HIGH (ref 11.5–15.5)
WBC: 9.9 K/uL (ref 4.0–10.5)
nRBC: 0 % (ref 0.0–0.2)

## 2024-07-13 LAB — PROTIME-INR
INR: 1.4 — ABNORMAL HIGH (ref 0.8–1.2)
Prothrombin Time: 17.6 s — ABNORMAL HIGH (ref 11.4–15.2)

## 2024-07-13 LAB — I-STAT CG4 LACTIC ACID, ED: Lactic Acid, Venous: 1.3 mmol/L (ref 0.5–1.9)

## 2024-07-13 LAB — RESP PANEL BY RT-PCR (RSV, FLU A&B, COVID)  RVPGX2
Influenza A by PCR: NEGATIVE
Influenza B by PCR: NEGATIVE
Resp Syncytial Virus by PCR: NEGATIVE
SARS Coronavirus 2 by RT PCR: NEGATIVE

## 2024-07-13 MED ORDER — AMLODIPINE BESYLATE 5 MG PO TABS
2.5000 mg | ORAL_TABLET | Freq: Every day | ORAL | Status: DC
  Administered 2024-07-13 – 2024-07-18 (×6): 2.5 mg via ORAL
  Filled 2024-07-13 (×6): qty 1

## 2024-07-13 MED ORDER — PANTOPRAZOLE SODIUM 40 MG PO TBEC
40.0000 mg | DELAYED_RELEASE_TABLET | Freq: Every day | ORAL | Status: DC
  Administered 2024-07-13 – 2024-07-18 (×6): 40 mg via ORAL
  Filled 2024-07-13 (×6): qty 1

## 2024-07-13 MED ORDER — GABAPENTIN 100 MG PO CAPS
100.0000 mg | ORAL_CAPSULE | Freq: Two times a day (BID) | ORAL | Status: DC
  Administered 2024-07-13 – 2024-07-18 (×10): 100 mg via ORAL
  Filled 2024-07-13 (×10): qty 1

## 2024-07-13 MED ORDER — FLUTICASONE FUROATE-VILANTEROL 100-25 MCG/ACT IN AEPB
1.0000 | INHALATION_SPRAY | Freq: Every day | RESPIRATORY_TRACT | Status: DC
  Administered 2024-07-15 – 2024-07-18 (×4): 1 via RESPIRATORY_TRACT
  Filled 2024-07-13 (×2): qty 28

## 2024-07-13 MED ORDER — SODIUM CHLORIDE 0.9 % IV SOLN
1.0000 g | INTRAVENOUS | Status: DC
  Administered 2024-07-14: 1 g via INTRAVENOUS
  Filled 2024-07-13: qty 10

## 2024-07-13 MED ORDER — FINASTERIDE 5 MG PO TABS
5.0000 mg | ORAL_TABLET | Freq: Every day | ORAL | Status: DC
  Administered 2024-07-13 – 2024-07-18 (×6): 5 mg via ORAL
  Filled 2024-07-13 (×6): qty 1

## 2024-07-13 MED ORDER — GABAPENTIN 100 MG PO CAPS
100.0000 mg | ORAL_CAPSULE | Freq: Once | ORAL | Status: AC
  Administered 2024-07-13: 100 mg via ORAL
  Filled 2024-07-13: qty 1

## 2024-07-13 MED ORDER — SODIUM CHLORIDE 0.9 % IV SOLN
2.0000 g | Freq: Once | INTRAVENOUS | Status: AC
  Administered 2024-07-13: 2 g via INTRAVENOUS
  Filled 2024-07-13: qty 20

## 2024-07-13 MED ORDER — METOPROLOL SUCCINATE ER 50 MG PO TB24
50.0000 mg | ORAL_TABLET | Freq: Every day | ORAL | Status: DC
  Administered 2024-07-13 – 2024-07-18 (×6): 50 mg via ORAL
  Filled 2024-07-13 (×4): qty 1
  Filled 2024-07-13: qty 2
  Filled 2024-07-13: qty 1

## 2024-07-13 MED ORDER — ACETAMINOPHEN 325 MG PO TABS
650.0000 mg | ORAL_TABLET | Freq: Once | ORAL | Status: AC
  Administered 2024-07-13: 650 mg via ORAL
  Filled 2024-07-13: qty 2

## 2024-07-13 MED ORDER — LACTATED RINGERS IV SOLN: INTRAVENOUS | Status: AC

## 2024-07-13 MED ORDER — TAMSULOSIN HCL 0.4 MG PO CAPS
0.4000 mg | ORAL_CAPSULE | Freq: Every day | ORAL | Status: DC
  Administered 2024-07-13 – 2024-07-18 (×6): 0.4 mg via ORAL
  Filled 2024-07-13 (×6): qty 1

## 2024-07-13 MED ORDER — SODIUM CHLORIDE 0.9 % IV SOLN: INTRAVENOUS | Status: AC

## 2024-07-13 MED ORDER — APIXABAN 2.5 MG PO TABS
2.5000 mg | ORAL_TABLET | Freq: Two times a day (BID) | ORAL | Status: DC
  Administered 2024-07-13 – 2024-07-18 (×11): 2.5 mg via ORAL
  Filled 2024-07-13 (×11): qty 1

## 2024-07-13 NOTE — Progress Notes (Signed)
   07/13/24 1635  TOC Brief Assessment  Insurance and Status Reviewed  Patient has primary care physician Yes  Home environment has been reviewed From home c/wife  Prior level of function: Assisted  Prior/Current Home Services Current home services (aide)  Social Drivers of Health Review SDOH reviewed no interventions necessary  Readmission risk has been reviewed Yes  Transition of care needs transition of care needs identified, TOC will continue to follow   Current DME: rollator, wc, BSC, raised toilet seat c/bars, shower chair, grab bars in shower, small lift to get from house to garage  Personal care aide c/[employer], M-F 0900-1300  Transition of Care Department (TOC) has reviewed patient and will continue to monitor patient advancement.

## 2024-07-13 NOTE — ED Triage Notes (Signed)
 Pt bibgcems from home. Pt has skin tear on right elbow that is now swollen and wife suspects it may be infected. Pt having abnormal behaviors like weakness, fever and increased confusion. EMS gave LR     152/60 Hr 80-130 22 rr  144 cbg 95% ra

## 2024-07-13 NOTE — ED Provider Notes (Signed)
 [place] EMERGENCY DEPARTMENT AT [facility] Provider Note   CSN: [id number] Arrival date & time: 07/13/24  9471     Patient presents with: Wound Infection   [name] is a 89 y.o. male.   The history is provided by the spouse. The history is limited by the condition of the patient (Dementia).   He has history of hypertension, hyperlipidemia, atrial fibrillation anticoagulated on apixaban  and is brought in by ambulance because of concern for sepsis.  He had fallen at home 5 days ago and suffered a skin tear to his right elbow.  2 days ago, he went to urgent care because of persistent bleeding from the site.  Yesterday, he went to emergency department at [facility] where x-rays were obtained which were reported to have been negative and he got additional local treatment.  After being discharged, he developed chills and fever as high as 101.  His right forearm and hand have been the last 2 days.  His wife noted that he was weaker than normal last night and started getting more agitated.  This is how he frequently acts when he has sepsis.  There has been no cough or vomiting or diarrhea.    Prior to Admission medications   Medication Sig Start Date End Date Taking? Authorizing Provider  acetaminophen  (TYLENOL ) 500 MG tablet Take 500 mg by mouth every 6 (six) hours as needed for mild pain.    [provider]  amLODipine  (NORVASC ) 2.5 MG tablet TAKE 1 TABLET DAILY 02/24/24   Burchette, Wolm LELON, MD  apixaban  (ELIQUIS ) 2.5 MG TABS tablet Take 1 tablet (2.5 mg total) by mouth 2 (two) times daily. 02/11/24   Shlomo Wilbert SAUNDERS, MD  budesonide -formoterol  (BREYNA ) 80-4.5 MCG/ACT inhaler Inhale 2 puffs into the lungs in the morning and at bedtime. 07/03/23   Burchette, Wolm LELON, MD  finasteride  (PROSCAR ) 5 MG tablet TAKE 1 TABLET (5 MG TOTAL) BY MOUTH DAILY. 02/24/24   Burchette, Wolm LELON, MD  furosemide  (LASIX ) 40 MG tablet TAKE 1 TABLET EVERY DAY AS NEEDED FOR LEG  SWELLING 04/28/24   Burchette, Wolm LELON, MD  gabapentin  (NEURONTIN ) 100 MG capsule Take 100 mg by mouth 2 (two) times daily. 02/08/24   [provider]  glucosamine-chondroitin 500-400 MG tablet Take 2 tablets by mouth daily.    [provider]  lansoprazole  (PREVACID ) 30 MG capsule TAKE 1 CAPSULE EVERY DAY AT 12 NOON. Patient taking differently: Take 30 mg by mouth daily at 12 noon. 01/19/24   Shlomo Wilbert SAUNDERS, MD  lovastatin  (MEVACOR ) 20 MG tablet TAKE 1 TABLET BY MOUTH EVERYDAY AT BEDTIME 04/28/24   Burchette, Wolm LELON, MD  metoprolol  succinate (TOPROL -XL) 50 MG 24 hr tablet TAKE 1 TABLET BY MOUTH EVERY DAY WITH OR IMMEDIATELY FOLLOWING A MEAL Patient taking differently: Take 50 mg by mouth daily. 02/11/24   Shlomo Wilbert SAUNDERS, MD  potassium chloride  (KLOR-CON ) 10 MEQ tablet TAKE 2 TABLETS BY MOUTH EVERY DAY 09/28/23   Burchette, Wolm LELON, MD  tamsulosin  (FLOMAX ) 0.4 MG CAPS capsule Take 1 capsule (0.4 mg total) by mouth daily. Patient taking differently: Take 0.4 mg by mouth in the morning and at bedtime. 09/06/21   Burchette, Wolm LELON, MD  vitamin B-12 (CYANOCOBALAMIN ) 500 MCG tablet Take 500 mcg by mouth daily.    [provider]  VITAMIN D PO Take 400 Units by mouth daily.     [provider]    Allergies: Losartan     Review  of Systems  Unable to perform ROS: Mental status change    Updated Vital Signs BP (!) 128/116   Pulse 89   Temp 97.6 F (36.4 C) (Oral)   Resp 18   SpO2 99%   Physical Exam Vitals and nursing note reviewed.   89 year old male, resting comfortably and in no acute distress. Vital signs are significant for elevated diastolic blood pressure.  He gets agitated during exam, but is able to be consoled.  Oxygen saturation is 99%, which is normal. Head is normocephalic and atraumatic. PERRLA, EOMI.  Neck is nontender and supple without adenopathy. Back is nontender and there is no CVA tenderness. Lungs are clear without rales, wheezes, or  rhonchi. Chest is nontender. Heart has an irregular rhythm without murmur. Abdomen is soft, flat, nontender. Extremities: Skin tears present over the right elbow with some persistent oozing of blood.  There is erythema surrounding the skin tear without lymphangitic streaks.  There is soft tissue swelling of the right forearm and hand.  Remainder of extremity exam is unremarkable. Skin is warm and dry without rash. Neurologic: Awake and alert, oriented to person and place but not time.  Cranial nerves are intact.  Moves all extremities equally.       (all labs ordered are listed, but only abnormal results are displayed) Labs Reviewed  COMPREHENSIVE METABOLIC PANEL WITH GFR - Abnormal; Notable for the following components:      Result Value   Glucose, Bld 131 (*)    BUN 28 (*)    Creatinine, Ser 1.70 (*)    Total Protein 6.3 (*)    GFR, Estimated 38 (*)    All other components within normal limits  CBC WITH DIFFERENTIAL/PLATELET - Abnormal; Notable for the following components:   RBC 3.47 (*)    Hemoglobin 11.6 (*)    HCT 35.9 (*)    MCV 103.5 (*)    RDW 15.9 (*)    Monocytes Absolute 1.2 (*)    Abs Immature Granulocytes 0.19 (*)    All other components within normal limits  PROTIME-INR - Abnormal; Notable for the following components:   Prothrombin Time 17.6 (*)    INR 1.4 (*)    All other components within normal limits  CULTURE, BLOOD (ROUTINE X 2)  CULTURE, BLOOD (ROUTINE X 2)  RESP PANEL BY RT-PCR (RSV, FLU A&B, COVID)  RVPGX2  I-STAT CG4 LACTIC ACID, ED    EKG: EKG Interpretation Date/Time:  Wednesday July 13 2024 05:40:15 EST Ventricular Rate:  109 PR Interval:  181 QRS Duration:  104 QT Interval:  321 QTC Calculation: 433 R Axis:   28  Text Interpretation: Atrial fibrillation abnormal T, consider ischemia, lateral leads When compared with ECG of 05/19/2024, T wave abnormality is now present Confirmed by Raford Lenis ([id number]) on 07/13/2024 5:48:39  AM  Radiology: [facility] Chest Port 1 View Result Date: 07/13/2024 EXAM: 1 VIEW(S) XRAY OF THE CHEST 07/13/2024 06:11:43 AM COMPARISON: 03/04/2024 CLINICAL HISTORY: Questionable sepsis - evaluate for abnormality FINDINGS: LUNGS AND PLEURA: Persistent low lung volumes. Stable left mid lung scarring since 2023. Patchy lung base opacity more resembles atelectasis than infection. No pleural effusion. No pneumothorax. HEART AND MEDIASTINUM: No acute abnormality of the cardiac and mediastinal silhouettes. BONES AND SOFT TISSUES: No acute osseous abnormality. IMPRESSION: 1. Patchy lung base opacity more resembles atelectasis than infection. Electronically signed by: Helayne Hurst MD 07/13/2024 06:18 AM EST RP Workstation: [id number]   DG Hand Complete Right Result Date: 07/12/2024 EXAM: 3  OR MORE VIEW(S) XRAY OF THE RIGHT HAND 07/12/2024 03:34:00 PM COMPARISON: None available. CLINICAL HISTORY: Injury FINDINGS: BONES AND JOINTS: No acute fracture. No focal osseous lesion. No joint dislocation. SOFT TISSUES: The soft tissues are unremarkable. IMPRESSION: 1. No acute osseous abnormality. Electronically signed by: Lynwood Seip MD 07/12/2024 03:55 PM EST RP Workstation: [id number]   DG Forearm Right Result Date: 07/12/2024 EXAM: VIEW(S) XRAY OF THE RIGHT FOREARM 07/12/2024 03:34:00 PM COMPARISON: None available. CLINICAL HISTORY: injury FINDINGS: FINDINGS: BONES AND JOINTS: Possible minimally displaced fracture involving olecranon spur of indeterminate age. No focal osseous lesion. No joint dislocation. SOFT TISSUES: The soft tissues are unremarkable. IMPRESSION: 1. Possible minimally displaced fracture involving the right olecranon spur, age indeterminate. Electronically signed by: Lynwood Seip MD 07/12/2024 03:53 PM EST RP Workstation: [id number]   DG Elbow Complete Right Result Date: 07/12/2024 EXAM: 3 VIEW(S) XRAY OF THE RIGHT ELBOW COMPARISON: None available. CLINICAL HISTORY: injury FINDINGS: BONES AND JOINTS:  Possible minimally displaced fracture involving olecranon spur of indeterminate age. No focal osseous lesion. No joint dislocation. No joint effusion. SOFT TISSUES: The soft tissues are unremarkable. IMPRESSION: 1. Possible minimally displaced fracture of the olecranon spur, age indeterminate. Electronically signed by: Lynwood Seip MD 07/12/2024 03:52 PM EST RP Workstation: [id number]   Cardiac monitor shows atrial fibrillation with controlled ventricular response, per my interpretation.  Procedures   Medications Ordered in the ED  lactated ringers  infusion ( Intravenous New Bag/Given 07/13/24 0620)  cefTRIAXone  (ROCEPHIN ) 2 g in sodium chloride  0.9 % 100 mL IVPB (0 g Intravenous Stopped 07/13/24 0653)  gabapentin  (NEURONTIN ) capsule 100 mg (100 mg Oral Given 07/13/24 9347)                                    Medical Decision Making Amount and/or Complexity of Data Reviewed Labs: ordered. Radiology: ordered.  Risk Prescription drug management. Decision regarding hospitalization.   Fever with weakness and agitation concerning for sepsis.  Source appears to be cellulitis surrounding the skin tear of the right elbow.  I have initiated the evolving sepsis pathway and I have ordered antibiotics of ceftriaxone  for his cellulitis.  I have reviewed his past records, and do note urgent care visit on 11/17 and ED visit on 11/18 for his the skin tear of the right elbow.  X-rays yesterday are reportedly show possible minimally displaced fracture of the olecranon spur age-indeterminate.  Clinically, he does not have an acute fracture of his elbow as he has full passive range of motion without pain.  I have reviewed his electrocardiogram, and my interpretation is atrial fibrillation with nonspecific T wave changes.  Hide chest x-ray shows hazy opacity over the left base which likely is atelectasis.  I have independently viewed the image, and agree with radiologist's interpretation.  I reviewed his  laboratory tests, my interpretation is normal lactic acid level, elevated random glucose level, acute kidney injury with creatinine 1.7 compared with baseline of 1.3, stable macrocytic anemia, mildly elevated INR secondary to anticoagulated state.  Urinalysis is still pending, but even if urinary tract infection is present ceftriaxone  would provide adequate antibiotic coverage.  I have discussed the case with Dr. Georgina of [facility], who agrees to admit the patient.  CRITICAL CARE Performed by: Alm Lias Total critical care time: 55 minutes Critical care time was exclusive of separately billable procedures and treating other patients. Critical care was necessary to treat or prevent imminent or  life-threatening deterioration. Critical care was time spent personally by me on the following activities: development of treatment plan with patient and/or surrogate as well as nursing, discussions with consultants, evaluation of patient's response to treatment, examination of patient, obtaining history from patient or surrogate, ordering and performing treatments and interventions, ordering and review of laboratory studies, ordering and review of radiographic studies, pulse oximetry and re-evaluation of patient's condition.     Final diagnoses:  Cellulitis of right elbow  Macrocytic anemia  Chronic atrial fibrillation (HCC)  Anticoagulated on apixaban   Acute kidney injury (nontraumatic)  Elevated random blood glucose level    ED Discharge Orders     None          Raford Lenis, MD 07/13/24 [phone]

## 2024-07-13 NOTE — H&P (Addendum)
 History and Physical    Patient: [name] FMW:[id number] DOB: [birth year] DOA: 07/13/2024 DOS: the patient was seen and examined on 07/13/2024 PCP: [name], MD  Patient coming from: Home  Chief Complaint:  Chief Complaint  Patient presents with   Wound Infection   HPI: [name] is a 89 y.o. male with medical history significant of dementia, PAF on Eliquis , CKD3, HTN, HLD, and recent admission in 7/23025 for presumed sepsis from LLE cellulitis p/w worsening confusion, and RUE swelling/erythema c/w cellulitis.  Pt is unable to provide a medical history due to dementia. His wife, who is available at the bedside, reports that the patient presented with a skin tear on the R arm that occurred several days ago. The patient initially sought treatment at an urgent care facility on Monday, where steri-strips were applied to stop the bleeding. In the evening, the patient became listless, sleepy, and developed a fever. Tylenol  was administered, but the patient continued to exhibit weakness and needed assistance moving. The patient used a walker due to pre-existing back issues but became notably weaker. The patient's wife noted increased confusion and restlessness. Despite the aforementioned measures, the bleeding and confusion continued, prompting a visit to the emergency department yesterday. Of note, pt denied abdominal pain, and reported nl bowel movements consistent with the patient's usual pattern, with no constipation.   In the ED, pt tachycardic and tachypneic w/o hypoxia (on RA). Labs notable for Cr 1.7 (baseline 1.2-1.3 in 02/2024). Lactate and WBC wnl. EDP requested admission for AKI and confusion.   Review of Systems: As mentioned in the history of present illness. All other systems reviewed and are negative. Past Medical History:  Diagnosis Date   Actinic keratosis 03/13/2009   Arthritis    BENIGN PROSTATIC HYPERTROPHY, HX OF 04/07/2009   CARCINOMA, SKIN, SQUAMOUS  CELL 09/11/2009   COLONIC POLYPS, HX OF 03/13/2009   Essential hypertension    GERD 03/13/2009   Hyperlipidemia    INSOMNIA, CHRONIC 09/11/2009   Persistent atrial fibrillation (HCC) 04/07/2009   a. s/p afib ablation at [facility] x 2 in 2010.   POLYPECTOMY, HX OF 04/07/2009   Premature atrial contractions    PULMONARY NODULE 09/11/2009   PVC's (premature ventricular contractions)    Rosacea 03/13/2009   SKIN CANCER, HX OF 03/13/2009   Past Surgical History:  Procedure Laterality Date   ABLATION  2010   x2   COLONOSCOPY W/ BIOPSIES AND POLYPECTOMY     EYE SURGERY Bilateral    cataracts   LUMBAR DISC SURGERY     RUPTURE   LUMBAR LAMINECTOMY/DECOMPRESSION MICRODISCECTOMY N/A 04/20/2014   Procedure: LUMBAR TWO-THREE, LUMBAR THREE-FOUR, LUMAR FOUR-FIVE LAMINECTOMIES;  Surgeon: Reyes Budge, MD;  Location: [facility] NEURO ORS;  Service: Neurosurgery;  Laterality: N/A;   POLYPECTOMY     SKIN CANCER EXCISION     TEE WITHOUT CARDIOVERSION N/A 06/01/2023   Procedure: TRANSESOPHAGEAL ECHOCARDIOGRAM;  Surgeon: Okey Vina GAILS, MD;  Location: [facility] INVASIVE CV LAB;  Service: Cardiovascular;  Laterality: N/A;   Social History:  reports that he quit smoking about 36 years ago. His smoking use included cigarettes. He started smoking about 66 years ago. He has a 30 pack-year smoking history. He has quit using smokeless tobacco.  His smokeless tobacco use included chew. He reports that he does not drink alcohol and does not use drugs.  Allergies  Allergen Reactions   Losartan  Nausea And Vomiting and Other (See Comments)    Elevated creatinine levels  Family History  Problem Relation Age of Onset   Cancer Mother    Cancer Father 54       COLON   Heart attack Father    Heart disease Father    Heart disease Sister    CAD Sister    Heart disease Brother    CAD Brother    CAD Brother    Heart disease Brother     Prior to Admission medications   Medication Sig Start Date End Date Taking? Authorizing  Provider  acetaminophen  (TYLENOL ) 500 MG tablet Take 500 mg by mouth every 6 (six) hours as needed for mild pain.   Yes [provider]  amLODipine  (NORVASC ) 2.5 MG tablet TAKE 1 TABLET DAILY 02/24/24  Yes Burchette, Wolm ORN, MD  apixaban  (ELIQUIS ) 2.5 MG TABS tablet Take 1 tablet (2.5 mg total) by mouth 2 (two) times daily. 02/11/24  Yes Turner, Wilbert SAUNDERS, MD  budesonide -formoterol  (BREYNA ) 80-4.5 MCG/ACT inhaler Inhale 2 puffs into the lungs in the morning and at bedtime. 07/03/23  Yes Burchette, Wolm ORN, MD  finasteride  (PROSCAR ) 5 MG tablet TAKE 1 TABLET (5 MG TOTAL) BY MOUTH DAILY. 02/24/24  Yes Burchette, Wolm ORN, MD  furosemide  (LASIX ) 40 MG tablet TAKE 1 TABLET EVERY DAY AS NEEDED FOR LEG SWELLING Patient taking differently: Take 40 mg by mouth daily as needed (leg swelling). TAKE 1 TABLET EVERY DAY AS NEEDED FOR LEG SWELLING 04/28/24  Yes Burchette, Wolm ORN, MD  gabapentin  (NEURONTIN ) 100 MG capsule Take 100 mg by mouth 2 (two) times daily. 02/08/24  Yes [provider]  glucosamine-chondroitin 500-400 MG tablet Take 2 tablets by mouth daily.   Yes [provider]  lansoprazole  (PREVACID ) 30 MG capsule TAKE 1 CAPSULE EVERY DAY AT 12 NOON. Patient taking differently: Take 30 mg by mouth daily at 12 noon. 01/19/24  Yes Turner, Wilbert SAUNDERS, MD  lovastatin  (MEVACOR ) 20 MG tablet TAKE 1 TABLET BY MOUTH EVERYDAY AT BEDTIME Patient taking differently: Take 20 mg by mouth at bedtime. 04/28/24  Yes Burchette, Wolm ORN, MD  metoprolol  succinate (TOPROL -XL) 50 MG 24 hr tablet TAKE 1 TABLET BY MOUTH EVERY DAY WITH OR IMMEDIATELY FOLLOWING A MEAL Patient taking differently: Take 50 mg by mouth daily. 02/11/24  Yes Turner, Wilbert SAUNDERS, MD  potassium chloride  (KLOR-CON ) 10 MEQ tablet TAKE 2 TABLETS BY MOUTH EVERY DAY Patient taking differently: Take 40 mEq by mouth daily. 09/28/23  Yes Burchette, Wolm ORN, MD  tamsulosin  (FLOMAX ) 0.4 MG CAPS capsule Take 1 capsule (0.4 mg total) by mouth daily. Patient  taking differently: Take 0.4 mg by mouth in the morning and at bedtime. 09/06/21  Yes Burchette, Wolm ORN, MD  vitamin B-12 (CYANOCOBALAMIN ) 500 MCG tablet Take 500 mcg by mouth daily.   Yes [provider]  VITAMIN D PO Take 400 Units by mouth daily.    Yes [provider]    Physical Exam: Vitals:   07/13/24 0536 07/13/24 0540 07/13/24 0551 07/13/24 0955  BP: (!) 128/116     Pulse: 89     Resp: 18     Temp: 97.6 F (36.4 C)   98.6 F (37 C)  TempSrc: Oral   Oral  SpO2: 99% 99%    Weight:   83.9 kg   Height:   6' 2 (1.88 m)     Data Reviewed:  Lab Results  Component Value Date   WBC 9.9 07/13/2024   HGB 11.6 (L) 07/13/2024   HCT 35.9 (L) 07/13/2024  MCV 103.5 (H) 07/13/2024   PLT 223 07/13/2024   Lab Results  Component Value Date   GLUCOSE 131 (H) 07/13/2024   CALCIUM 9.3 07/13/2024   NA 141 07/13/2024   K 4.2 07/13/2024   CO2 23 07/13/2024   CL 104 07/13/2024   BUN 28 (H) 07/13/2024   CREATININE 1.70 (H) 07/13/2024   Lab Results  Component Value Date   ALT 9 07/13/2024   AST 20 07/13/2024   ALKPHOS 56 07/13/2024   BILITOT 1.2 07/13/2024   Lab Results  Component Value Date   INR 1.4 (H) 07/13/2024   INR 1.2 03/04/2024   INR 1.1 03/27/2007   Radiology: [facility] Chest Port 1 View Result Date: 07/13/2024 EXAM: 1 VIEW(S) XRAY OF THE CHEST 07/13/2024 06:11:43 AM COMPARISON: 03/04/2024 CLINICAL HISTORY: Questionable sepsis - evaluate for abnormality FINDINGS: LUNGS AND PLEURA: Persistent low lung volumes. Stable left mid lung scarring since 2023. Patchy lung base opacity more resembles atelectasis than infection. No pleural effusion. No pneumothorax. HEART AND MEDIASTINUM: No acute abnormality of the cardiac and mediastinal silhouettes. BONES AND SOFT TISSUES: No acute osseous abnormality. IMPRESSION: 1. Patchy lung base opacity more resembles atelectasis than infection. Electronically signed by: Helayne Hurst MD 07/13/2024 06:18 AM EST RP Workstation:  [id number]   DG Hand Complete Right Result Date: 07/12/2024 EXAM: 3 OR MORE VIEW(S) XRAY OF THE RIGHT HAND 07/12/2024 03:34:00 PM COMPARISON: None available. CLINICAL HISTORY: Injury FINDINGS: BONES AND JOINTS: No acute fracture. No focal osseous lesion. No joint dislocation. SOFT TISSUES: The soft tissues are unremarkable. IMPRESSION: 1. No acute osseous abnormality. Electronically signed by: Lynwood Seip MD 07/12/2024 03:55 PM EST RP Workstation: [id number]   DG Forearm Right Result Date: 07/12/2024 EXAM: VIEW(S) XRAY OF THE RIGHT FOREARM 07/12/2024 03:34:00 PM COMPARISON: None available. CLINICAL HISTORY: injury FINDINGS: FINDINGS: BONES AND JOINTS: Possible minimally displaced fracture involving olecranon spur of indeterminate age. No focal osseous lesion. No joint dislocation. SOFT TISSUES: The soft tissues are unremarkable. IMPRESSION: 1. Possible minimally displaced fracture involving the right olecranon spur, age indeterminate. Electronically signed by: Lynwood Seip MD 07/12/2024 03:53 PM EST RP Workstation: [id number]   DG Elbow Complete Right Result Date: 07/12/2024 EXAM: 3 VIEW(S) XRAY OF THE RIGHT ELBOW COMPARISON: None available. CLINICAL HISTORY: injury FINDINGS: BONES AND JOINTS: Possible minimally displaced fracture involving olecranon spur of indeterminate age. No focal osseous lesion. No joint dislocation. No joint effusion. SOFT TISSUES: The soft tissues are unremarkable. IMPRESSION: 1. Possible minimally displaced fracture of the olecranon spur, age indeterminate. Electronically signed by: Lynwood Seip MD 07/12/2024 03:52 PM EST RP Workstation: [id number]    Assessment and Plan: 76M h/o dementia, PAF on Eliquis , HTN, and HLD p/w AKI and worsening confusion c/w acute encephalopathy, and RUE swelling/erythema c/w cellulitis.   Acute encephalopathy Presumably related to worsening cellulitis and AKI -See below  AKI -MIVF: NS at 50cc/h for 24h -Strict I&Os and daily weights  (standing preferred) -F/u PVR to r/o post-renal obstruction -F/u BMP daily -Renally dose medications for CrCl -Avoid lovenox, NSAIDs, morphine , Fleet's phosphate enema, regular insulin , contrast; no gadolinium for MRI to avoid nephrogenic systemic fibrosis -Consider renal US  and nephrology consult if worsening AKI  RUE cellulitis -IV CTX 1g daily for now while inpatient; switch to Augmentin 875mg  BID at discharge to complete 5-7 day course -F/u UA/reflex culture and blood culture to exclude acute cystitis and bacteremia  PAF -PTA Eliquis  2.5mg  BID and metoprolol  XL 50mg  daily  HTN -PTA amlodipine  -HOLD pta lasix  for now; resume at d/c  assuming pt tol nl PO  HLD -HOLD pta statin and resume at discharge   Advance Care Planning:   Code Status: Full Code   Consults: N/A  Family Communication: Wife  Severity of Illness: The appropriate patient status for this patient is OBSERVATION. Observation status is judged to be reasonable and necessary in order to provide the required intensity of service to ensure the patient's safety. The patient's presenting symptoms, physical exam findings, and initial radiographic and laboratory data in the context of their medical condition is felt to place them at decreased risk for further clinical deterioration. Furthermore, it is anticipated that the patient will be medically stable for discharge from the hospital within 2 midnights of admission.    ------- I spent 55 minutes reviewing previous notes, at the bedside counseling/discussing the treatment plan, and performing clinical documentation.  Author: Marsha Ada, MD 07/13/2024 10:19 AM  For on call review [email or web address].

## 2024-07-13 NOTE — Care Management Obs Status (Signed)
 MEDICARE OBSERVATION STATUS NOTIFICATION   Patient Details  Name: [name] MRN: [id number] Date of Birth: 14-Mar-1932   Medicare Observation Status Notification Given:  Yes    Nena LITTIE Coffee, RN 07/13/2024, 4:14 PM

## 2024-07-13 NOTE — Plan of Care (Signed)

## 2024-07-14 ENCOUNTER — Inpatient Hospital Stay (HOSPITAL_COMMUNITY)

## 2024-07-14 DIAGNOSIS — R7881 Bacteremia: Secondary | ICD-10-CM

## 2024-07-14 DIAGNOSIS — S41111A Laceration without foreign body of right upper arm, initial encounter: Secondary | ICD-10-CM | POA: Diagnosis present

## 2024-07-14 DIAGNOSIS — Z1152 Encounter for screening for COVID-19: Secondary | ICD-10-CM | POA: Diagnosis not present

## 2024-07-14 DIAGNOSIS — I340 Nonrheumatic mitral (valve) insufficiency: Secondary | ICD-10-CM | POA: Diagnosis not present

## 2024-07-14 DIAGNOSIS — E785 Hyperlipidemia, unspecified: Secondary | ICD-10-CM | POA: Diagnosis present

## 2024-07-14 DIAGNOSIS — Z7951 Long term (current) use of inhaled steroids: Secondary | ICD-10-CM | POA: Diagnosis not present

## 2024-07-14 DIAGNOSIS — Z8619 Personal history of other infectious and parasitic diseases: Secondary | ICD-10-CM | POA: Diagnosis not present

## 2024-07-14 DIAGNOSIS — Z85828 Personal history of other malignant neoplasm of skin: Secondary | ICD-10-CM | POA: Diagnosis not present

## 2024-07-14 DIAGNOSIS — Z23 Encounter for immunization: Secondary | ICD-10-CM | POA: Diagnosis present

## 2024-07-14 DIAGNOSIS — I10 Essential (primary) hypertension: Secondary | ICD-10-CM | POA: Diagnosis not present

## 2024-07-14 DIAGNOSIS — Z888 Allergy status to other drugs, medicaments and biological substances status: Secondary | ICD-10-CM | POA: Diagnosis not present

## 2024-07-14 DIAGNOSIS — Z7901 Long term (current) use of anticoagulants: Secondary | ICD-10-CM | POA: Diagnosis not present

## 2024-07-14 DIAGNOSIS — Z87891 Personal history of nicotine dependence: Secondary | ICD-10-CM | POA: Diagnosis not present

## 2024-07-14 DIAGNOSIS — Z79899 Other long term (current) drug therapy: Secondary | ICD-10-CM | POA: Diagnosis not present

## 2024-07-14 DIAGNOSIS — F03A0 Unspecified dementia, mild, without behavioral disturbance, psychotic disturbance, mood disturbance, and anxiety: Secondary | ICD-10-CM | POA: Diagnosis present

## 2024-07-14 DIAGNOSIS — A411 Sepsis due to other specified staphylococcus: Secondary | ICD-10-CM | POA: Diagnosis present

## 2024-07-14 DIAGNOSIS — N1831 Chronic kidney disease, stage 3a: Secondary | ICD-10-CM | POA: Diagnosis present

## 2024-07-14 DIAGNOSIS — G9341 Metabolic encephalopathy: Secondary | ICD-10-CM | POA: Diagnosis present

## 2024-07-14 DIAGNOSIS — N400 Enlarged prostate without lower urinary tract symptoms: Secondary | ICD-10-CM | POA: Diagnosis present

## 2024-07-14 DIAGNOSIS — R262 Difficulty in walking, not elsewhere classified: Secondary | ICD-10-CM | POA: Diagnosis present

## 2024-07-14 DIAGNOSIS — I4891 Unspecified atrial fibrillation: Secondary | ICD-10-CM | POA: Diagnosis not present

## 2024-07-14 DIAGNOSIS — B958 Unspecified staphylococcus as the cause of diseases classified elsewhere: Secondary | ICD-10-CM | POA: Diagnosis not present

## 2024-07-14 DIAGNOSIS — L03113 Cellulitis of right upper limb: Secondary | ICD-10-CM | POA: Diagnosis present

## 2024-07-14 DIAGNOSIS — I4821 Permanent atrial fibrillation: Secondary | ICD-10-CM | POA: Diagnosis present

## 2024-07-14 DIAGNOSIS — I130 Hypertensive heart and chronic kidney disease with heart failure and stage 1 through stage 4 chronic kidney disease, or unspecified chronic kidney disease: Secondary | ICD-10-CM | POA: Diagnosis present

## 2024-07-14 DIAGNOSIS — Z8249 Family history of ischemic heart disease and other diseases of the circulatory system: Secondary | ICD-10-CM | POA: Diagnosis not present

## 2024-07-14 DIAGNOSIS — L0390 Cellulitis, unspecified: Secondary | ICD-10-CM | POA: Diagnosis present

## 2024-07-14 DIAGNOSIS — K219 Gastro-esophageal reflux disease without esophagitis: Secondary | ICD-10-CM | POA: Diagnosis present

## 2024-07-14 DIAGNOSIS — M545 Low back pain, unspecified: Secondary | ICD-10-CM | POA: Diagnosis present

## 2024-07-14 DIAGNOSIS — W19XXXA Unspecified fall, initial encounter: Secondary | ICD-10-CM | POA: Diagnosis present

## 2024-07-14 DIAGNOSIS — N179 Acute kidney failure, unspecified: Secondary | ICD-10-CM | POA: Diagnosis present

## 2024-07-14 LAB — CBC WITH DIFFERENTIAL/PLATELET
Abs Immature Granulocytes: 0.2 K/uL — ABNORMAL HIGH (ref 0.00–0.07)
Basophils Absolute: 0 K/uL (ref 0.0–0.1)
Basophils Relative: 1 %
Eosinophils Absolute: 0.1 K/uL (ref 0.0–0.5)
Eosinophils Relative: 1 %
HCT: 30.1 % — ABNORMAL LOW (ref 39.0–52.0)
Hemoglobin: 9.8 g/dL — ABNORMAL LOW (ref 13.0–17.0)
Immature Granulocytes: 4 %
Lymphocytes Relative: 27 %
Lymphs Abs: 1.5 K/uL (ref 0.7–4.0)
MCH: 33.3 pg (ref 26.0–34.0)
MCHC: 32.6 g/dL (ref 30.0–36.0)
MCV: 102.4 fL — ABNORMAL HIGH (ref 80.0–100.0)
Monocytes Absolute: 1.2 K/uL — ABNORMAL HIGH (ref 0.1–1.0)
Monocytes Relative: 21 %
Neutro Abs: 2.7 K/uL (ref 1.7–7.7)
Neutrophils Relative %: 46 %
Platelets: 175 K/uL (ref 150–400)
RBC: 2.94 MIL/uL — ABNORMAL LOW (ref 4.22–5.81)
RDW: 15.7 % — ABNORMAL HIGH (ref 11.5–15.5)
WBC: 5.8 K/uL (ref 4.0–10.5)
nRBC: 0 % (ref 0.0–0.2)

## 2024-07-14 LAB — COMPREHENSIVE METABOLIC PANEL WITH GFR
ALT: 11 U/L (ref 0–44)
AST: 24 U/L (ref 15–41)
Albumin: 2.8 g/dL — ABNORMAL LOW (ref 3.5–5.0)
Alkaline Phosphatase: 47 U/L (ref 38–126)
Anion gap: 15 (ref 5–15)
BUN: 23 mg/dL (ref 8–23)
CO2: 24 mmol/L (ref 22–32)
Calcium: 8.3 mg/dL — ABNORMAL LOW (ref 8.9–10.3)
Chloride: 104 mmol/L (ref 98–111)
Creatinine, Ser: 1.4 mg/dL — ABNORMAL HIGH (ref 0.61–1.24)
GFR, Estimated: 48 mL/min — ABNORMAL LOW (ref >60)
Glucose, Bld: 93 mg/dL (ref 70–99)
Potassium: 3.8 mmol/L (ref 3.5–5.1)
Sodium: 143 mmol/L (ref 135–145)
Total Bilirubin: 0.5 mg/dL (ref 0.0–1.2)
Total Protein: 5 g/dL — ABNORMAL LOW (ref 6.5–8.1)

## 2024-07-14 LAB — BLOOD CULTURE ID PANEL (REFLEXED) - BCID2

## 2024-07-14 LAB — ECHOCARDIOGRAM COMPLETE
Area-P 1/2: 2.58 cm2
Height: 74 in
S' Lateral: 3.6 cm
Weight: 2960 [oz_av]

## 2024-07-14 MED ORDER — CEFAZOLIN SODIUM-DEXTROSE 2-4 GM/100ML-% IV SOLN
2.0000 g | Freq: Two times a day (BID) | INTRAVENOUS | Status: DC
  Administered 2024-07-14 (×2): 2 g via INTRAVENOUS
  Filled 2024-07-14 (×2): qty 100

## 2024-07-14 MED ORDER — ALBUTEROL SULFATE (2.5 MG/3ML) 0.083% IN NEBU
2.5000 mg | INHALATION_SOLUTION | RESPIRATORY_TRACT | Status: DC | PRN
  Administered 2024-07-14 – 2024-07-16 (×2): 2.5 mg via RESPIRATORY_TRACT
  Filled 2024-07-14 (×4): qty 3

## 2024-07-14 MED ORDER — LACTATED RINGERS IV SOLN: INTRAVENOUS | Status: DC

## 2024-07-14 MED ORDER — PERFLUTREN LIPID MICROSPHERE
1.0000 mL | INTRAVENOUS | Status: AC | PRN
  Administered 2024-07-14: 3 mL via INTRAVENOUS

## 2024-07-14 NOTE — Progress Notes (Signed)
 PHARMACY - PHYSICIAN COMMUNICATION CRITICAL VALUE ALERT - BLOOD CULTURE IDENTIFICATION (BCID)  [name] is an 89 y.o. male who presented to [facility] on 07/13/2024 with RUE swelling/erythema s/w cellulitis   Assessment: 1/4 blood cultures staph lugdunensis (no R) -afebrile, CRO for 2 days  Name of physician (or Provider) Contacted: Dr. Franky   Current antibiotics: Ceftriaxone  1g IV every 24 hours   Changes to prescribed antibiotics recommended:   -Change ceftriaxone  to cefazolin  2g IV every 12 hours   Results for orders placed or performed during the hospital encounter of 07/13/24  Blood Culture ID Panel (Reflexed) (Collected: 07/13/2024  5:49 AM)  Result Value Ref Range   Enterococcus faecalis NOT DETECTED NOT DETECTED   Enterococcus Faecium NOT DETECTED NOT DETECTED   Listeria monocytogenes NOT DETECTED NOT DETECTED   Staphylococcus species DETECTED (A) NOT DETECTED   Staphylococcus aureus (BCID) NOT DETECTED NOT DETECTED   Staphylococcus epidermidis NOT DETECTED NOT DETECTED   Staphylococcus lugdunensis DETECTED (A) NOT DETECTED   Streptococcus species NOT DETECTED NOT DETECTED   Streptococcus agalactiae NOT DETECTED NOT DETECTED   Streptococcus pneumoniae NOT DETECTED NOT DETECTED   Streptococcus pyogenes NOT DETECTED NOT DETECTED   A.calcoaceticus-baumannii NOT DETECTED NOT DETECTED   Bacteroides fragilis NOT DETECTED NOT DETECTED   Enterobacterales NOT DETECTED NOT DETECTED   Enterobacter cloacae complex NOT DETECTED NOT DETECTED   Escherichia coli NOT DETECTED NOT DETECTED   Klebsiella aerogenes NOT DETECTED NOT DETECTED   Klebsiella oxytoca NOT DETECTED NOT DETECTED   Klebsiella pneumoniae NOT DETECTED NOT DETECTED   Proteus species NOT DETECTED NOT DETECTED   Salmonella species NOT DETECTED NOT DETECTED   Serratia marcescens NOT DETECTED NOT DETECTED   Haemophilus influenzae NOT DETECTED NOT DETECTED   Neisseria meningitidis NOT DETECTED NOT DETECTED    Pseudomonas aeruginosa NOT DETECTED NOT DETECTED   Stenotrophomonas maltophilia NOT DETECTED NOT DETECTED   Candida albicans NOT DETECTED NOT DETECTED   Candida auris NOT DETECTED NOT DETECTED   Candida glabrata NOT DETECTED NOT DETECTED   Candida krusei NOT DETECTED NOT DETECTED   Candida parapsilosis NOT DETECTED NOT DETECTED   Candida tropicalis NOT DETECTED NOT DETECTED   Cryptococcus neoformans/gattii NOT DETECTED NOT DETECTED   Methicillin resistance mecA/C NOT DETECTED NOT DETECTED    Lynwood Poplar, PharmD, BCPS Clinical Pharmacist 07/14/2024 6:56 AM

## 2024-07-14 NOTE — Consult Note (Signed)
 [facility]    Date of Admission:  07/13/2024     Total days of antibiotics 3               Reason for Consult: Staphylococcus lugdunensis bacteremia Referring Provider: Dr. Royal Primary Care Provider: Micheal Wolm ORN, MD   ASSESSMENT:  [name] is a 89 year old Caucasian male admitted with altered mental status and lethargy following a recent fall sustaining right arm skin tear and wound with 1 out of 2 blood cultures positive for Staphylococcus lugdunensis.  X-ray showed possible minimally displaced fracture of the olecranon.  Discussed recommended plan of care to continue current dose of cefazolin  pending blood culture sensitivity results with adjustments of antibiotics as indicated.  TTE without evidence of endocarditis and have a low suspicion for bacterial endocarditis given acute onset of symptoms but will check TEE which is planned for tomorrow.  Obtain blood cultures for clearance of bacteremia.  Basic wound care.  Standard/universal precautions.  Remaining medical and supportive care per internal medicine.  PLAN:  Continue current dose of cefazolin . Monitor blood cultures for sensitivities and adjust antibiotics accordingly. Obtain blood cultures for clearance of bacteremia. TEE scheduled for tomorrow Basic wound care of right elbow.  Standard/universal precautions. Remaining medical and supportive care per Internal Medicine.    Principal Problem:   Cellulitis Active Problems:   Bacteremia    amLODipine   2.5 mg Oral Daily   apixaban   2.5 mg Oral BID   finasteride   5 mg Oral Daily   fluticasone  furoate-vilanterol  1 puff Inhalation Daily   gabapentin   100 mg Oral BID   metoprolol  succinate  50 mg Oral Daily   pantoprazole   40 mg Oral Daily   tamsulosin   0.4 mg Oral Daily     HPI: [name] is a 89 y.o. male with previous medical history of skin cancer, insomnia, hyperlipidemia, hypertension, and benign prostatic  hypertrophy presenting to the hospital with altered mental status and weakness.  [name] was recently seen at urgent care on 07/12/2024 following a fall at home resulting in skin tears and wound to his right elbow.  There is no head injury or loss of consciousness.  X-rays showed a minimally displaced fracture of the olecranon spur with age and determinate.  Discharged with wound care instructions.  Now returns to the ED with altered mental status and fever.  [name] began having symptoms of altered mental status approximately 24 hours prior to presentation and was more lethargic per family.  Afebrile with no leukocytosis on arrival.  Chest x-ray with patchy lung opacity more resembling atelectasis.  Started on ceftriaxone .  Blood culture with 1 out of 2 bottles positive for Staphylococcus lugdunensis.  Currently on day 3 of antimicrobial therapy.  Appears to be baseline mental status per family.    Review of Systems: Review of Systems  Constitutional:  Negative for chills, fever and weight loss.  Respiratory:  Negative for cough, shortness of breath and wheezing.   Cardiovascular:  Negative for chest pain and leg swelling.  Gastrointestinal:  Negative for abdominal pain, constipation, diarrhea, nausea and vomiting.  Skin:  Negative for rash.     Past Medical History:  Diagnosis Date   Actinic keratosis 03/13/2009   Arthritis    BENIGN PROSTATIC HYPERTROPHY, HX OF 04/07/2009   CARCINOMA, SKIN, SQUAMOUS CELL 09/11/2009   COLONIC POLYPS, HX OF 03/13/2009   Essential hypertension    GERD 03/13/2009   Hyperlipidemia  INSOMNIA, CHRONIC 09/11/2009   Persistent atrial fibrillation (HCC) 04/07/2009   a. s/p afib ablation at [facility] x 2 in 2010.   POLYPECTOMY, HX OF 04/07/2009   Premature atrial contractions    PULMONARY NODULE 09/11/2009   PVC's (premature ventricular contractions)    Rosacea 03/13/2009   SKIN CANCER, HX OF 03/13/2009    Social History   Tobacco Use   Smoking status:  Former    Current packs/day: 0.00    Average packs/day: 1 pack/day for 30.0 years (30.0 ttl pk-yrs)    Types: Cigarettes    Start date: 03/13/1958    Quit date: 03/13/1988    Years since quitting: 36.3   Smokeless tobacco: Former    Types: Chew  Substance Use Topics   Alcohol use: No   Drug use: No    Family History  Problem Relation Age of Onset   Cancer Mother    Cancer Father 32       COLON   Heart attack Father    Heart disease Father    Heart disease Sister    CAD Sister    Heart disease Brother    CAD Brother    CAD Brother    Heart disease Brother     Allergies  Allergen Reactions   Losartan  Nausea And Vomiting and Other (See Comments)    Elevated creatinine levels     OBJECTIVE: Blood pressure (!) 143/61, pulse 65, temperature 98.2 F (36.8 C), temperature source Oral, resp. rate 18, height 6' 2 (1.88 m), weight 83.9 kg, SpO2 96%.  Physical Exam Constitutional:      General: He is not in acute distress.    Appearance: He is well-developed.  Cardiovascular:     Rate and Rhythm: Normal rate and regular rhythm.     Heart sounds: Normal heart sounds.  Pulmonary:     Effort: Pulmonary effort is normal.     Breath sounds: Normal breath sounds.  Skin:    General: Skin is warm and dry.  Neurological:     Mental Status: He is alert and oriented to person, place, and time.     Lab Results Lab Results  Component Value Date   WBC 5.8 07/14/2024   HGB 9.8 (L) 07/14/2024   HCT 30.1 (L) 07/14/2024   MCV 102.4 (H) 07/14/2024   PLT 175 07/14/2024    Lab Results  Component Value Date   CREATININE 1.40 (H) 07/14/2024   BUN 23 07/14/2024   NA 143 07/14/2024   K 3.8 07/14/2024   CL 104 07/14/2024   CO2 24 07/14/2024    Lab Results  Component Value Date   ALT 11 07/14/2024   AST 24 07/14/2024   ALKPHOS 47 07/14/2024   BILITOT 0.5 07/14/2024     Microbiology: Recent Results (from the past 240 hours)  Culture, blood (Routine x 2)     Status: None  (Preliminary result)   Collection Time: 07/13/24  5:49 AM   Specimen: BLOOD LEFT ARM  Result Value Ref Range Status   Specimen Description BLOOD LEFT ARM  Final   Special Requests   Final    BOTTLES DRAWN AEROBIC AND ANAEROBIC Blood Culture adequate volume   Culture  Setup Time   Final    GRAM POSITIVE COCCI AEROBIC BOTTLE ONLY [facility]  PHARM D 07/14/2024 @ 0649 BY DD Performed at [facility], [address], [place], [place] [address]    Culture [facility] POSITIVE COCCI  Final   Report Status  PENDING  Incomplete  Blood Culture ID Panel (Reflexed)     Status: Abnormal   Collection Time: 07/13/24  5:49 AM  Result Value Ref Range Status   Enterococcus faecalis NOT DETECTED NOT DETECTED Final   Enterococcus Faecium NOT DETECTED NOT DETECTED Final   Listeria monocytogenes NOT DETECTED NOT DETECTED Final   Staphylococcus species DETECTED (A) NOT DETECTED Final    Comment: CRITICAL RESULT CALLED TO, READ BACK BY AND VERIFIED WITH: R[facility]  PHARM D 07/14/2024 @ 0649 BY DD    Staphylococcus aureus (BCID) NOT DETECTED NOT DETECTED Final   Staphylococcus epidermidis NOT DETECTED NOT DETECTED Final   Staphylococcus lugdunensis DETECTED (A) NOT DETECTED Final    Comment: CRITICAL RESULT CALLED TO, READ BACK BY AND VERIFIED WITH: R[facility]  PHARM D 07/14/2024 @ 0649 BY DD    Streptococcus species NOT DETECTED NOT DETECTED Final   Streptococcus agalactiae NOT DETECTED NOT DETECTED Final   Streptococcus pneumoniae NOT DETECTED NOT DETECTED Final   Streptococcus pyogenes NOT DETECTED NOT DETECTED Final   A.calcoaceticus-baumannii NOT DETECTED NOT DETECTED Final   Bacteroides fragilis NOT DETECTED NOT DETECTED Final   Enterobacterales NOT DETECTED NOT DETECTED Final   Enterobacter cloacae complex NOT DETECTED NOT DETECTED Final   Escherichia coli NOT DETECTED NOT DETECTED Final   Klebsiella aerogenes NOT DETECTED NOT DETECTED Final   Klebsiella oxytoca NOT DETECTED NOT DETECTED Final    Klebsiella pneumoniae NOT DETECTED NOT DETECTED Final   Proteus species NOT DETECTED NOT DETECTED Final   Salmonella species NOT DETECTED NOT DETECTED Final   Serratia marcescens NOT DETECTED NOT DETECTED Final   Haemophilus influenzae NOT DETECTED NOT DETECTED Final   Neisseria meningitidis NOT DETECTED NOT DETECTED Final   Pseudomonas aeruginosa NOT DETECTED NOT DETECTED Final   Stenotrophomonas maltophilia NOT DETECTED NOT DETECTED Final   Candida albicans NOT DETECTED NOT DETECTED Final   Candida auris NOT DETECTED NOT DETECTED Final   Candida glabrata NOT DETECTED NOT DETECTED Final   Candida krusei NOT DETECTED NOT DETECTED Final   Candida parapsilosis NOT DETECTED NOT DETECTED Final   Candida tropicalis NOT DETECTED NOT DETECTED Final   Cryptococcus neoformans/gattii NOT DETECTED NOT DETECTED Final   Methicillin resistance mecA/C NOT DETECTED NOT DETECTED Final    Comment: Performed at [facility], [address] [place], [place] [address]  Culture, blood (Routine x 2)     Status: None (Preliminary result)   Collection Time: 07/13/24  5:53 AM   Specimen: BLOOD LEFT ARM  Result Value Ref Range Status   Specimen Description BLOOD LEFT ARM  Final   Special Requests   Final    BOTTLES DRAWN AEROBIC AND ANAEROBIC Blood Culture adequate volume   Culture   Final    NO GROWTH 1 DAY Performed at [facility], [address] [place], [place] [address]    Report Status PENDING  Incomplete  Resp panel by RT-PCR (RSV, Flu A&B, Covid) Anterior Nasal Swab     Status: None   Collection Time: 07/13/24  5:53 AM   Specimen: Anterior Nasal Swab  Result Value Ref Range Status   SARS Coronavirus 2 by RT PCR NEGATIVE NEGATIVE Final   Influenza A by PCR NEGATIVE NEGATIVE Final   Influenza B by PCR NEGATIVE NEGATIVE Final    Comment: (NOTE) The Xpert Xpress SARS-CoV-2/FLU/RSV plus assay is intended as an aid in the diagnosis of influenza from Nasopharyngeal swab specimens  and should not be used as a  sole basis for treatment. Nasal washings and aspirates are unacceptable for Xpert Xpress SARS-CoV-2/FLU/RSV testing.  Fact Sheet for Patients: bloggercourse.com  Fact Sheet for Healthcare Providers: seriousbroker.it  This test is not yet approved or cleared by the [place]  FDA and has been authorized for detection and/or diagnosis of SARS-CoV-2 by FDA under an Emergency Use Authorization (EUA). This EUA will remain in effect (meaning this test can be used) for the duration of the COVID-19 declaration under Section 564(b)(1) of the Act, 21 U.S.C. section 360bbb-3(b)(1), unless the authorization is terminated or revoked.     Resp Syncytial Virus by PCR NEGATIVE NEGATIVE Final    Comment: (NOTE) Fact Sheet for Patients: bloggercourse.com  Fact Sheet for Healthcare Providers: seriousbroker.it  This test is not yet approved or cleared by the [place]  FDA and has been authorized for detection and/or diagnosis of SARS-CoV-2 by FDA under an Emergency Use Authorization (EUA). This EUA will remain in effect (meaning this test can be used) for the duration of the COVID-19 declaration under Section 564(b)(1) of the Act, 21 U.S.C. section 360bbb-3(b)(1), unless the authorization is terminated or revoked.  Performed at [facility], [address], [place], [place] [address]      Cathlyn July, NP [facility]  07/14/2024  2:19 PM

## 2024-07-14 NOTE — H&P (View-Only) (Signed)
 [facility]   ROUNDING   NOTE [name] FMW:[id number]  DOB: Aug 07, 1932  DOA: 07/13/2024  PCP: [name], MD  07/14/2024,9:17 AM  LOS: 0 days    Code Status: Full code     from: Home   89 year old male Permanent A-fib CHADVASC >4 on Eliquis -ablation X2 [facility] 2010-persistent PAC PVC by monitors 2017 Previous MSSA bacteremia 05/2023 from pneumonia-TEE 06/01/2023 negative for vegetation-received 4 weeks of Ancef  via PICC line ending 06/24/2023 Previous episodes of diverticulitis CKD 3A BPH Last admitted 02/2024 for lower extremity cellulitis,?  On the left lower extremity diverticulitis-blood cultures at the time are negative He has low back pain and was cleared by cardiology 05/19/2024 for bilateral transforaminal ESI's  Chronology  11/17 seen at [facility] urgent care for skin abrasion with bleeding which happened about 11/14 Went to [facility] 11/18 and was discharged from there x-rays were negative 11/19 from home skin tear right elbow increasing confusion hypertensive heart rates-Tmax as high as 101 in the ED subsequently-treated with LR infusion ceftriaxone ?  Minimally displaced olecranon spur age-indeterminate he had full passive ROM Found to have AKI BUN/creatinine 28/1.7, WBC 9.9 platelet 223-RVP negative lactic acid negative    Pertinent imaging/studies till date  11/19 CXR patchy lung opacity?  Atelectasis/infection   Assessment  & Plan :    Sepsis secondary to staph lugdunensis probably from right upper extremity cellulitis with encephalopathic prodrome Previous MSSA bacteremia in the past 05/2023 admission ID already engaged and planning TEE-risk factors prior bacteremia and skin tear Continuing at this time Ancef  duration to be determined Patchy opacities on lungs Do not suspect pneumonia his chest is clear he is satting well X-ray if further worsening AKI on admission superimposed on CKD 3 Underlying BPH PTA Lasix  40 daily as needed held-labs already  improving Fluid rate now 50 cc/H and saline lock a.m. Continue Proscar  5 Flomax  0.4 Accidental fall  nondisplaced/minimally displaced olecranon spur? Has full range of motion does not require further workup for arm unless something changes and will need x-rays in 2 weeks Permanent A-fib failing ablation in the past on Eliquis  Continues on apixaban  2.5 twice daily-rate control with metoprolol  XL 50-keep on monitors today Low back pain Recent supposed to get ESI's for the back Underlying sundowning/mild dementia according to patient's wife He has some baseline confusion worsening in the evening-wife has caregivers at home Ambulatory dysfunction in the setting of low back pain status post ESI's previously Caution with gabapentin -100 twice daily given advanced age-resumed   Data Reviewed today:  Sodium 143 potassium 3.8 BUN/creatinine 23/1.4 LFTs normal WBC 5.8 hemoglobin 9.8 platelet 175 (baseline 200s)    DVT prophylaxis: Eliquis   Status is: Observation Inpatient    Dispo/Global plan: Unclear   Time 50   Subjective:   Coherent x 2 but gets a little confused and this is compounded by his difficulty hearing    Objective + exam Vitals:   07/13/24 1959 07/14/24 0034 07/14/24 0443 07/14/24 0802  BP: 131/77 (!) 122/58 127/60 133/66  Pulse: 77 60 68 66  Resp: 20   18  Temp: 98.8 F (37.1 C) (!) 97.3 F (36.3 C) 97.7 F (36.5 C) 97.8 F (36.6 C)  TempSrc: Oral Oral Oral Oral  SpO2: 96% 98% 98% 98%  Weight:      Height:       Filed Weights   07/13/24 0551  Weight: 83.9 kg     Examination: EOMI NCAT no focal deficit no icterus no pallor Does have a  scar on his upper chest Chest is clear posterolaterally no wheeze rales rhonchi Abdomen soft I did not examine the wound on his right arm Power 5/5   Scheduled Meds:  amLODipine   2.5 mg Oral Daily   apixaban   2.5 mg Oral BID   finasteride   5 mg Oral Daily   fluticasone  furoate-vilanterol  1 puff Inhalation Daily    gabapentin   100 mg Oral BID   metoprolol  succinate  50 mg Oral Daily   pantoprazole   40 mg Oral Daily   tamsulosin   0.4 mg Oral Daily   Continuous Infusions:  sodium chloride  50 mL/hr at 07/13/24 1959    ceFAZolin  (ANCEF ) IV       Jai-Gurmukh Akeelah Seppala, MD  [facility]

## 2024-07-14 NOTE — Progress Notes (Signed)
 [facility]   ROUNDING   NOTE [name] FMW:[id number]  DOB: 04-04-1932  DOA: 07/13/2024  PCP: [name], MD  07/14/2024,9:17 AM  LOS: 0 days    Code Status: Full code     from: Home   89 year old male Permanent A-fib CHADVASC >4 on Eliquis -ablation X2 [facility] 2010-persistent PAC PVC by monitors 2017 Previous MSSA bacteremia 05/2023 from pneumonia-TEE 06/01/2023 negative for vegetation-received 4 weeks of Ancef  via PICC line ending 06/24/2023 Previous episodes of diverticulitis CKD 3A BPH Last admitted 02/2024 for lower extremity cellulitis,?  On the left lower extremity diverticulitis-blood cultures at the time are negative He has low back pain and was cleared by cardiology 05/19/2024 for bilateral transforaminal ESI's  Chronology  11/17 seen at [facility] urgent care for skin abrasion with bleeding which happened about 11/14 Went to [facility] 11/18 and was discharged from there x-rays were negative 11/19 from home skin tear right elbow increasing confusion hypertensive heart rates-Tmax as high as 101 in the ED subsequently-treated with LR infusion ceftriaxone ?  Minimally displaced olecranon spur age-indeterminate he had full passive ROM Found to have AKI BUN/creatinine 28/1.7, WBC 9.9 platelet 223-RVP negative lactic acid negative    Pertinent imaging/studies till date  11/19 CXR patchy lung opacity?  Atelectasis/infection   Assessment  & Plan :    Sepsis secondary to staph lugdunensis probably from right upper extremity cellulitis with encephalopathic prodrome Previous MSSA bacteremia in the past 05/2023 admission ID already engaged and planning TEE-risk factors prior bacteremia and skin tear Continuing at this time Ancef  duration to be determined Patchy opacities on lungs Do not suspect pneumonia his chest is clear he is satting well X-ray if further worsening AKI on admission superimposed on CKD 3 Underlying BPH PTA Lasix  40 daily as needed held-labs already  improving Fluid rate now 50 cc/H and saline lock a.m. Continue Proscar  5 Flomax  0.4 Accidental fall  nondisplaced/minimally displaced olecranon spur? Has full range of motion does not require further workup for arm unless something changes and will need x-rays in 2 weeks Permanent A-fib failing ablation in the past on Eliquis  Continues on apixaban  2.5 twice daily-rate control with metoprolol  XL 50-keep on monitors today Low back pain Recent supposed to get ESI's for the back Underlying sundowning/mild dementia according to patient's wife He has some baseline confusion worsening in the evening-wife has caregivers at home Ambulatory dysfunction in the setting of low back pain status post ESI's previously Caution with gabapentin -100 twice daily given advanced age-resumed   Data Reviewed today:  Sodium 143 potassium 3.8 BUN/creatinine 23/1.4 LFTs normal WBC 5.8 hemoglobin 9.8 platelet 175 (baseline 200s)    DVT prophylaxis: Eliquis   Status is: Observation Inpatient    Dispo/Global plan: Unclear   Time 50   Subjective:   Coherent x 2 but gets a little confused and this is compounded by his difficulty hearing    Objective + exam Vitals:   07/13/24 1959 07/14/24 0034 07/14/24 0443 07/14/24 0802  BP: 131/77 (!) 122/58 127/60 133/66  Pulse: 77 60 68 66  Resp: 20   18  Temp: 98.8 F (37.1 C) (!) 97.3 F (36.3 C) 97.7 F (36.5 C) 97.8 F (36.6 C)  TempSrc: Oral Oral Oral Oral  SpO2: 96% 98% 98% 98%  Weight:      Height:       Filed Weights   07/13/24 0551  Weight: 83.9 kg     Examination: EOMI NCAT no focal deficit no icterus no pallor Does have a  scar on his upper chest Chest is clear posterolaterally no wheeze rales rhonchi Abdomen soft I did not examine the wound on his right arm Power 5/5   Scheduled Meds:  amLODipine   2.5 mg Oral Daily   apixaban   2.5 mg Oral BID   finasteride   5 mg Oral Daily   fluticasone  furoate-vilanterol  1 puff Inhalation Daily    gabapentin   100 mg Oral BID   metoprolol  succinate  50 mg Oral Daily   pantoprazole   40 mg Oral Daily   tamsulosin   0.4 mg Oral Daily   Continuous Infusions:  sodium chloride  50 mL/hr at 07/13/24 1959    ceFAZolin  (ANCEF ) IV       Jai-Gurmukh Akeelah Seppala, MD  [facility]

## 2024-07-15 ENCOUNTER — Encounter (HOSPITAL_COMMUNITY): Payer: Self-pay | Admitting: Family Medicine

## 2024-07-15 ENCOUNTER — Encounter (HOSPITAL_COMMUNITY)
Admission: EM | Disposition: A | Discharge status: Home / Self Care | Payer: Self-pay | Source: Home / Self Care | Attending: Family Medicine

## 2024-07-15 ENCOUNTER — Inpatient Hospital Stay (HOSPITAL_COMMUNITY)

## 2024-07-15 ENCOUNTER — Inpatient Hospital Stay (HOSPITAL_COMMUNITY): Admitting: Anesthesiology

## 2024-07-15 DIAGNOSIS — L03113 Cellulitis of right upper limb: Secondary | ICD-10-CM | POA: Diagnosis not present

## 2024-07-15 DIAGNOSIS — I4891 Unspecified atrial fibrillation: Secondary | ICD-10-CM

## 2024-07-15 DIAGNOSIS — R7881 Bacteremia: Secondary | ICD-10-CM

## 2024-07-15 DIAGNOSIS — I340 Nonrheumatic mitral (valve) insufficiency: Secondary | ICD-10-CM

## 2024-07-15 DIAGNOSIS — Z87891 Personal history of nicotine dependence: Secondary | ICD-10-CM

## 2024-07-15 DIAGNOSIS — I10 Essential (primary) hypertension: Secondary | ICD-10-CM

## 2024-07-15 HISTORY — PX: TRANSESOPHAGEAL ECHOCARDIOGRAM (CATH LAB): EP1270

## 2024-07-15 LAB — BASIC METABOLIC PANEL WITH GFR
Anion gap: 12 (ref 5–15)
BUN: 20 mg/dL (ref 8–23)
CO2: 23 mmol/L (ref 22–32)
Calcium: 8.3 mg/dL — ABNORMAL LOW (ref 8.9–10.3)
Chloride: 103 mmol/L (ref 98–111)
Creatinine, Ser: 1.48 mg/dL — ABNORMAL HIGH (ref 0.61–1.24)
GFR, Estimated: 45 mL/min — ABNORMAL LOW (ref >60)
Glucose, Bld: 107 mg/dL — ABNORMAL HIGH (ref 70–99)
Potassium: 3.9 mmol/L (ref 3.5–5.1)
Sodium: 138 mmol/L (ref 135–145)

## 2024-07-15 LAB — CBC WITH DIFFERENTIAL/PLATELET
Abs Immature Granulocytes: 0.29 K/uL — ABNORMAL HIGH (ref 0.00–0.07)
Basophils Absolute: 0.1 K/uL (ref 0.0–0.1)
Basophils Relative: 1 %
Eosinophils Absolute: 0.1 K/uL (ref 0.0–0.5)
Eosinophils Relative: 1 %
HCT: 31.6 % — ABNORMAL LOW (ref 39.0–52.0)
Hemoglobin: 10.4 g/dL — ABNORMAL LOW (ref 13.0–17.0)
Immature Granulocytes: 4 %
Lymphocytes Relative: 14 %
Lymphs Abs: 1.2 K/uL (ref 0.7–4.0)
MCH: 33.2 pg (ref 26.0–34.0)
MCHC: 32.9 g/dL (ref 30.0–36.0)
MCV: 101 fL — ABNORMAL HIGH (ref 80.0–100.0)
Monocytes Absolute: 1.3 K/uL — ABNORMAL HIGH (ref 0.1–1.0)
Monocytes Relative: 15 %
Neutro Abs: 5.4 K/uL (ref 1.7–7.7)
Neutrophils Relative %: 65 %
Platelets: 194 K/uL (ref 150–400)
RBC: 3.13 MIL/uL — ABNORMAL LOW (ref 4.22–5.81)
RDW: 15.7 % — ABNORMAL HIGH (ref 11.5–15.5)
WBC: 8.3 K/uL (ref 4.0–10.5)
nRBC: 0 % (ref 0.0–0.2)

## 2024-07-15 LAB — ECHO TEE
AR max vel: 2.26 cm2
AV Area VTI: 2.07 cm2
AV Area mean vel: 1.87 cm2
AV Mean grad: 3 mmHg
AV Peak grad: 5.2 mmHg
Ao pk vel: 1.14 m/s
Area-P 1/2: 4.31 cm2
MV VTI: 1.65 cm2

## 2024-07-15 SURGERY — TRANSESOPHAGEAL ECHOCARDIOGRAM (TEE) (CATHLAB)
Anesthesia: Monitor Anesthesia Care

## 2024-07-15 MED ORDER — SODIUM CHLORIDE 0.9 % IV SOLN: INTRAVENOUS | Status: DC

## 2024-07-15 MED ORDER — PROPOFOL 500 MG/50ML IV EMUL
INTRAVENOUS | Status: DC | PRN
  Administered 2024-07-15: 90 ug/kg/min via INTRAVENOUS

## 2024-07-15 MED ORDER — CEFAZOLIN SODIUM-DEXTROSE 2-4 GM/100ML-% IV SOLN: 2.0000 g | Freq: Three times a day (TID) | INTRAVENOUS | Status: DC

## 2024-07-15 MED ORDER — PROPOFOL 10 MG/ML IV BOLUS
INTRAVENOUS | Status: DC | PRN
  Administered 2024-07-15: 20 mg via INTRAVENOUS
  Administered 2024-07-15: 60 mg via INTRAVENOUS
  Administered 2024-07-15 (×2): 10 mg via INTRAVENOUS

## 2024-07-15 MED ORDER — CEFAZOLIN SODIUM-DEXTROSE 2-4 GM/100ML-% IV SOLN
2.0000 g | Freq: Three times a day (TID) | INTRAVENOUS | Status: DC
  Administered 2024-07-15 – 2024-07-18 (×9): 2 g via INTRAVENOUS
  Filled 2024-07-15 (×9): qty 100

## 2024-07-15 NOTE — Anesthesia Preprocedure Evaluation (Addendum)
 Anesthesia Evaluation  Patient identified by MRN, date of birth, ID band Patient awake    Reviewed: Allergy & Precautions, NPO status , Patient's Chart, lab work & pertinent test results, reviewed documented beta blocker date and time   History of Anesthesia Complications Negative for: history of anesthetic complications  Airway Mallampati: II  TM Distance: >3 FB Neck ROM: Full    Dental  (+) Dental Advisory Given   Pulmonary former smoker   Pulmonary exam normal        Cardiovascular hypertension, Pt. on medications and Pt. on home beta blockers Normal cardiovascular exam+ dysrhythmias Atrial Fibrillation    '25 TTE - EF 60 to 65%. There is mild left ventricular hypertrophy. Trivial mitral valve regurgitation.     Neuro/Psych  Vertigo   negative psych ROS   GI/Hepatic Neg liver ROS,GERD  Medicated and Controlled,,  Endo/Other  negative endocrine ROS    Renal/GU CRFRenal disease     Musculoskeletal  (+) Arthritis ,    Abdominal   Peds  Hematology  (+) Blood dyscrasia, anemia  INR 1.4    Anesthesia Other Findings   Reproductive/Obstetrics                              Anesthesia Physical Anesthesia Plan  ASA: 3  Anesthesia Plan: MAC   Post-op Pain Management:    Induction:   PONV Risk Score and Plan: 1 and Propofol  infusion and Treatment may vary due to age or medical condition  Airway Management Planned: Nasal Cannula and Natural Airway  Additional Equipment: None  Intra-op Plan:   Post-operative Plan:   Informed Consent: I have reviewed the patients History and Physical, chart, labs and discussed the procedure including the risks, benefits and alternatives for the proposed anesthesia with the patient or authorized representative who has indicated his/her understanding and acceptance.       Plan Discussed with: CRNA and Anesthesiologist  Anesthesia Plan Comments:           Anesthesia Quick Evaluation

## 2024-07-15 NOTE — Plan of Care (Signed)
  Problem: Education: Goal: Knowledge of General Education information will improve Description: Including pain rating scale, medication(s)/side effects and non-pharmacologic comfort measures Outcome: Progressing   Problem: Clinical Measurements: Goal: Diagnostic test results will improve Outcome: Progressing Goal: Respiratory complications will improve Outcome: Progressing   Problem: Activity: Goal: Risk for activity intolerance will decrease Outcome: Progressing   Problem: Nutrition: Goal: Adequate nutrition will be maintained Outcome: Progressing   

## 2024-07-15 NOTE — Progress Notes (Signed)
 [facility]   ROUNDING   NOTE [name] FMW:[id number]  DOB: 09-26-1931  DOA: 07/13/2024  PCP: [name], MD  07/15/2024,1:50 PM  LOS: 1 day    Code Status: Full code     from: Home   89 year old male Permanent A-fib CHADVASC >4 on Eliquis -ablation X2 [facility] 2010-persistent PAC PVC by monitors 2017 Previous MSSA bacteremia 05/2023 from pneumonia-TEE 06/01/2023 negative for vegetation-received 4 weeks of Ancef  via PICC line ending 06/24/2023 Previous episodes of diverticulitis CKD 3A BPH Last admitted 02/2024 for lower extremity cellulitis,?  On the left lower extremity diverticulitis-blood cultures at the time are negative He has low back pain and was cleared by cardiology 05/19/2024 for bilateral transforaminal ESI's  Chronology  11/17 seen at [facility] urgent care for skin abrasion with bleeding which happened about 11/14 Went to [facility] 11/18 and was discharged from there x-rays were negative 11/19 from home skin tear right elbow increasing confusion hypertensive heart rates-Tmax as high as 101 in the ED subsequently-treated with LR infusion ceftriaxone ---?  Minimally displaced olecranon spur age-indeterminate he had full passive ROM Found to have AKI BUN/creatinine 28/1.7, WBC 9.9 platelet 223-RVP negative lactic acid negative    Pertinent imaging/studies till date  11/19 CXR patchy lung opacity?  Atelectasis/infection 11/21 TEE does not show vegetation   Assessment  & Plan :    Sepsis secondary to staph lugdunensis probably from right upper extremity cellulitis with encephalopathic prodrome Previous MSSA bacteremia in the past 05/2023 admission TEE negative, continuing at this time Ancef  duration to be determined by ID Repeat blood culture 11/20 pending Patchy opacities on lungs Do not suspect pneumonia his chest is clear he is satting well X-ray if further worsening AKI on admission superimposed on CKD 3 Underlying BPH PTA Lasix  40 daily as needed held-labs  continue to be stableBut improved from admission--- saline lock IV Continue Proscar  5 Flomax  0.4 Accidental fall  nondisplaced/minimally displaced olecranon spur? Skin tears Has full range of motion does not require further workup for arm unless something changes and will need x-rays in 2 weeks Wound to be reviewed by wound care dressing as per them--Ancef  will probably cover the MSSA and the skin tear is the likely source Permanent A-fib failing ablation in the past on Eliquis  Continues on apixaban  2.5 twice daily-rate control with metoprolol  XL 50-keep on monitors today Low back pain Recent supposed to get ESI's for the back Underlying sundowning/mild dementia according to patient's wife He has some baseline confusion worsening in the evening-wife has caregivers at home Patient has stabilized looks well feels fair and is closer to his baseline Ambulatory dysfunction in the setting of low back pain status post ESI's previously Caution with gabapentin -100 twice daily given advanced age-resumed   Data Reviewed today:   Sodium 138 potassium 3.9 CO2 23 BUN/creatinine 20/1.4 Hemoglobin 10.4 platelet 194 WBC 8.3  DVT prophylaxis: Eliquis   Status is: Observation Inpatient    Dispo/Global plan: Unclear   Time 50   Subjective:   Just back from procedure had lunch-a little bit sleepy Orientable to some degree No pain no fever Overall since admission  Objective + exam Vitals:   07/15/24 1103 07/15/24 1113 07/15/24 1123 07/15/24 1142  BP: (!) 112/53 (!) 105/90 133/70 (!) 126/54  Pulse: 82 82 80 69  Resp: 13 17 14 16   Temp: 98 F (36.7 C)   98.2 F (36.8 C)  TempSrc: Tympanic   Oral  SpO2: 91% 94% 95% 96%  Weight:  Height:      You Filed Weights   07/13/24 0551  Weight: 83.9 kg     Examination: EOMI NCAT no focal deficit no icterus no pallor Does have a scar on his upper chest Right upper extremity not examined seems to be in bandage Good range of motion  overall Chest is clear posterolaterally no wheeze rales rhonchi Abdomen soft No lower extremity edema power 5/5   Scheduled Meds:  amLODipine   2.5 mg Oral Daily   apixaban   2.5 mg Oral BID   finasteride   5 mg Oral Daily   fluticasone  furoate-vilanterol  1 puff Inhalation Daily   gabapentin   100 mg Oral BID   metoprolol  succinate  50 mg Oral Daily   pantoprazole   40 mg Oral Daily   tamsulosin   0.4 mg Oral Daily   Continuous Infusions:   ceFAZolin  (ANCEF ) IV 2 g (07/15/24 1322)   albuterol   Colen Grimes, MD  [facility]

## 2024-07-15 NOTE — CV Procedure (Signed)
    TRANSESOPHAGEAL ECHOCARDIOGRAM   NAME:  [name]    MRN: [id number] DOB:  03-24-1932    ADMIT DATE: 07/13/2024  INDICATIONS: Bacteremia   PROCEDURE:   Informed consent was obtained prior to the procedure. The risks, benefits and alternatives for the procedure were discussed and the patient comprehended these risks.  Risks include, but are not limited to, cough, sore throat, vomiting, nausea, somnolence, esophageal and stomach trauma or perforation, bleeding, low blood pressure, aspiration, pneumonia, infection, trauma to the teeth and death.    Procedural time out performed. The oropharynx was anesthetized.  Anesthesia was administered by anesthesia team (see their records).  The transesophageal probe was inserted in the esophagus and stomach without difficulty and multiple views were obtained.   COMPLICATIONS:    There were no immediate complications.  KEY FINDINGS:  LVEF 60-65% No vegetation.  Full report to follow. Wife updated.  Further management per primary team.   Madonna Large, DO, [facility]  A Division of [facility] [address] [place], [place] [address]  Pager: [phone] Office: [phone]

## 2024-07-15 NOTE — Consult Note (Signed)
 WOC Nurse Consult Note: Reason for Consult: R upper extremity skin tear  Wound type: full thickness R upper extremity r/t trauma  Pressure Injury POA: na  Measurement:see nursing flowsheet  Wound bed: largely red moist  Drainage (amount, consistency, odor) serosanguinous  Periwound: erythema  Dressing procedure/placement/frequency: Cleanse R upper arm wound with Vashe, do not rinse. Apply Xeroform gauze ([place] [phone]) to wound bed daily, cover with ABD pad and secure with Kerlix roll gauze.  Soak dressing with normal saline if adhered to wound bed for atraumatic removal.   POC discussed with bedside nurse. Appreciate T. Rosalva, LPN assistance with this consult.   WOC team will not follow.  Reconsult if further needs arise.   Thank you,    Powell Bar MSN, RN-BC, [employer]

## 2024-07-15 NOTE — Plan of Care (Signed)
 Id brief update   Tee no vegetation Afebrile No leukocytosis 11/20 repeat bcx ngtd   A/p Right upper ext traumatic skin tear/cellulitis Staph lugdenensis bsi; source right arm   -given clear onset of timing <3 days (wound a few days prior to sx onset), and clearance bsi and tee negative, would plan 2 weeks treatment -tomorrow if 11/20 bcx remains negative, then finish 2 weeks abx on 12/3 -5 days iv abx starting 11/20-11/24 then finish course with linezolid  600 mg po bid -maintain standard isolation precaution -no id clinic f/u needed -discuss with primary team

## 2024-07-15 NOTE — Progress Notes (Signed)
 PHARMACY NOTE:  ANTIMICROBIAL RENAL DOSAGE ADJUSTMENT  Current antimicrobial regimen includes a mismatch between antimicrobial dosage and estimated renal function.  As per policy approved by the Pharmacy & Therapeutics and Medical Executive Committees, the antimicrobial dosage will be adjusted accordingly.  Current antimicrobial dosage:  Cefazolin  2 gm IV Q 12 hours  Indication: Staph lugdunensis bacteremia  Renal Function:  Estimated Creatinine Clearance: 38.6 mL/min (A) (by C-G formula based on SCr of 1.48 mg/dL (H)). []      On intermittent HD, scheduled: []      On CRRT    Antimicrobial dosage has been changed to:  Cefazolin  2 gm IV Q 8 hours   Additional comments:   Thank you for allowing pharmacy to be a part of this patient's care.  Damien Quiet, PharmD, BCPS, BCIDP Infectious Diseases Clinical Pharmacist Phone: [phone] 07/15/2024 11:37 AM

## 2024-07-15 NOTE — Progress Notes (Signed)
 Per provider to get patient out of bed to improve mobility. Patient struggled getting out of bed but once up he was able to ambulate from bed to door with front wheel walker. Patient wanted to sit in the chair instead of getting back in the bed. Wife at bedside. Respirations normal on room air

## 2024-07-15 NOTE — Transfer of Care (Signed)
 Immediate Anesthesia Transfer of Care Note  Patient: [name]  Procedure(s) Performed: TRANSESOPHAGEAL ECHOCARDIOGRAM  Patient Location: PACU and Cath Lab  Anesthesia Type:MAC  Level of Consciousness: awake, drowsy, patient cooperative, and responds to stimulation  Airway & Oxygen Therapy: Patient Spontanous Breathing and Patient connected to nasal cannula oxygen  Post-op Assessment: Report given to RN and Post -op Vital signs reviewed and stable  Post vital signs: Reviewed and stable  Last Vitals:  Vitals Value Taken Time  BP 112/53 07/15/24 11:03  Temp 36.7 C 07/15/24 11:03  Pulse 76 07/15/24 11:04  Resp 29 07/15/24 11:04  SpO2 92 % 07/15/24 11:04  Vitals shown include unfiled device data.  Last Pain:  Vitals:   07/15/24 1103  TempSrc: Tympanic  PainSc: Asleep         Complications: No notable events documented.

## 2024-07-15 NOTE — Progress Notes (Signed)
   [facility] has been requested to perform a transesophageal echocardiogram on [name] for bacteremia.    The patient does NOT have any absolute or relative contraindications to a Transesophageal Echocardiogram (TEE).  The patient has: No other conditions that may impact this procedure.    After careful review of history and examination, the risks and benefits of transesophageal echocardiogram have been explained including risks of esophageal damage, perforation (1:10,000 risk), bleeding, pharyngeal hematoma as well as other potential complications associated with conscious sedation including aspiration, arrhythmia, respiratory failure and death. Alternatives to treatment were discussed, questions were answered. Patient is willing to proceed.   Patient has some baseline dementia therefore I spoke with both the patient and his wife, [name] over the phone. They are both agreeable to this proccedure  Signed, Waddell DELENA Donath, PA-C  07/15/2024 6:38 AM

## 2024-07-15 NOTE — Anesthesia Postprocedure Evaluation (Signed)
 Anesthesia Post Note  Patient: [name]  Procedure(s) Performed: TRANSESOPHAGEAL ECHOCARDIOGRAM     Patient location during evaluation: PACU Anesthesia Type: MAC Level of consciousness: awake and alert Pain management: pain level controlled Vital Signs Assessment: post-procedure vital signs reviewed and stable Respiratory status: spontaneous breathing, nonlabored ventilation and respiratory function stable Cardiovascular status: stable and blood pressure returned to baseline Anesthetic complications: no   No notable events documented.  Last Vitals:  Vitals:   07/15/24 1123 07/15/24 1142  BP: 133/70 (!) 126/54  Pulse: 80 69  Resp: 14 16  Temp:  36.8 C  SpO2: 95% 96%    Last Pain:  Vitals:   07/15/24 1142  TempSrc: Oral  PainSc:                  [name] Like

## 2024-07-15 NOTE — Interval H&P Note (Signed)
 History and Physical Interval Note:  07/15/2024 10:18 AM  Lynwood LELON Sink  has presented today for surgery, with the diagnosis of Bacteremia.  The various methods of treatment have been discussed with the patient and family. After consideration of risks, benefits and other options for treatment, the patient has consented to  Procedure(s): TRANSESOPHAGEAL ECHOCARDIOGRAM (N/A) as a surgical intervention.  The patient's history has been reviewed, patient examined, no change in status, stable for surgery.  I have reviewed the patient's chart and labs.  Questions were answered to the patient's satisfaction.    Informed Consent   Shared Decision Making/Informed Consent   The risks [esophageal damage, perforation (1:10,000 risk), bleeding, pharyngeal hematoma as well as other potential complications associated with conscious sedation including aspiration, arrhythmia, respiratory failure and death], benefits (treatment guidance and diagnostic support) and alternatives of a transesophageal echocardiogram were discussed in detail with Mr. Hnat and he is willing to proceed.      Patient has dementia per chart - called his wife and spoke to her regarding the nature of the procedure and risk/benefits/alternative. She is in agreement to proceed forward  No absolute contraindications.  Dentures will need to be removed.   Bartolo Montanye 10:20 AM

## 2024-07-15 NOTE — Plan of Care (Signed)
  Problem: Health Behavior/Discharge Planning: Goal: Ability to manage health-related needs will improve Outcome: Progressing   Problem: Clinical Measurements: Goal: Will remain free from infection Outcome: Progressing   Problem: Clinical Measurements: Goal: Respiratory complications will improve Outcome: Progressing   Problem: Nutrition: Goal: Adequate nutrition will be maintained Outcome: Progressing   Problem: Elimination: Goal: Will not experience complications related to urinary retention Outcome: Progressing   Problem: Skin Integrity: Goal: Risk for impaired skin integrity will decrease Outcome: Progressing

## 2024-07-16 ENCOUNTER — Inpatient Hospital Stay (HOSPITAL_COMMUNITY)

## 2024-07-16 ENCOUNTER — Encounter (HOSPITAL_COMMUNITY): Payer: Self-pay | Admitting: Cardiology

## 2024-07-16 DIAGNOSIS — L03113 Cellulitis of right upper limb: Secondary | ICD-10-CM | POA: Diagnosis not present

## 2024-07-16 LAB — CULTURE, BLOOD (ROUTINE X 2): Special Requests: ADEQUATE

## 2024-07-16 LAB — BASIC METABOLIC PANEL WITH GFR
Anion gap: 12 (ref 5–15)
BUN: 21 mg/dL (ref 8–23)
CO2: 23 mmol/L (ref 22–32)
Calcium: 8.2 mg/dL — ABNORMAL LOW (ref 8.9–10.3)
Chloride: 105 mmol/L (ref 98–111)
Creatinine, Ser: 1.45 mg/dL — ABNORMAL HIGH (ref 0.61–1.24)
GFR, Estimated: 46 mL/min — ABNORMAL LOW (ref >60)
Glucose, Bld: 108 mg/dL — ABNORMAL HIGH (ref 70–99)
Potassium: 3.5 mmol/L (ref 3.5–5.1)
Sodium: 140 mmol/L (ref 135–145)

## 2024-07-16 LAB — CBC WITH DIFFERENTIAL/PLATELET
Abs Immature Granulocytes: 0.22 K/uL — ABNORMAL HIGH (ref 0.00–0.07)
Basophils Absolute: 0 K/uL (ref 0.0–0.1)
Basophils Relative: 1 %
Eosinophils Absolute: 0.1 K/uL (ref 0.0–0.5)
Eosinophils Relative: 1 %
HCT: 30.1 % — ABNORMAL LOW (ref 39.0–52.0)
Hemoglobin: 9.8 g/dL — ABNORMAL LOW (ref 13.0–17.0)
Immature Granulocytes: 3 %
Lymphocytes Relative: 19 %
Lymphs Abs: 1.3 K/uL (ref 0.7–4.0)
MCH: 33.2 pg (ref 26.0–34.0)
MCHC: 32.6 g/dL (ref 30.0–36.0)
MCV: 102 fL — ABNORMAL HIGH (ref 80.0–100.0)
Monocytes Absolute: 1.2 K/uL — ABNORMAL HIGH (ref 0.1–1.0)
Monocytes Relative: 19 %
Neutro Abs: 3.8 K/uL (ref 1.7–7.7)
Neutrophils Relative %: 57 %
Platelets: 187 K/uL (ref 150–400)
RBC: 2.95 MIL/uL — ABNORMAL LOW (ref 4.22–5.81)
RDW: 15.8 % — ABNORMAL HIGH (ref 11.5–15.5)
WBC: 6.7 K/uL (ref 4.0–10.5)
nRBC: 0 % (ref 0.0–0.2)

## 2024-07-16 MED ORDER — FUROSEMIDE 40 MG PO TABS
40.0000 mg | ORAL_TABLET | Freq: Every day | ORAL | Status: AC
  Administered 2024-07-16 – 2024-07-17 (×2): 40 mg via ORAL
  Filled 2024-07-16 (×2): qty 1

## 2024-07-16 MED ORDER — POTASSIUM CHLORIDE CRYS ER 20 MEQ PO TBCR
20.0000 meq | EXTENDED_RELEASE_TABLET | Freq: Once | ORAL | Status: AC
  Administered 2024-07-16: 20 meq via ORAL
  Filled 2024-07-16: qty 1

## 2024-07-16 NOTE — Progress Notes (Addendum)
 [facility] Brief Progress Note   _____________________________________________________________________________________________________________  Patient Name: [name] Patient DOB: 11/07/1931 Date: 07/16/2024      Data: Recv'd secure chat from Dr Royal on CXR order.    Action: Chart reviewed and order at STAT. Contacted Radiology and they are awaiting transport for pt.    Response:  Updated Dr Samtani. Will periodically check back and update once CXR complete.  ADDENDUM 1437: CXR completed and resulted. Updated Dr Royal.   _____________________________________________________________________________________________________________  [facility] RN Expeditor Aadith Raudenbush Please contact us  directly via secure chat (search for [facility]) or by calling us  at [phone] [facility]).

## 2024-07-16 NOTE — Plan of Care (Signed)

## 2024-07-16 NOTE — Progress Notes (Signed)
 [facility]   ROUNDING   NOTE [name] FMW:[id number]  DOB: 31-Dec-1931  DOA: 07/13/2024  PCP: [name], MD  07/16/2024,12:00 PM  LOS: 2 days    Code Status: Full code     from: Home   89 year old male Permanent A-fib CHADVASC >4 on Eliquis -ablation X2 [facility] 2010-persistent PAC PVC by monitors 2017 Previous MSSA bacteremia 05/2023 from pneumonia-TEE 06/01/2023 negative for vegetation-received 4 weeks of Ancef  via PICC line ending 06/24/2023 Previous episodes of diverticulitis CKD 3A BPH Last admitted 02/2024 for lower extremity cellulitis,?  On the left lower extremity diverticulitis-blood cultures at the time are negative He has low back pain and was cleared by cardiology 05/19/2024 for bilateral transforaminal ESI's  Chronology  11/17 seen at [facility] urgent care for skin abrasion with bleeding which happened about 11/14 Went to [facility] 11/18 and was discharged from there x-rays were negative 11/19 from home skin tear right elbow increasing confusion hypertensive heart rates-Tmax as high as 101 in the ED subsequently-treated with LR infusion ceftriaxone ---?  Minimally displaced olecranon spur age-indeterminate he had full passive ROM Found to have AKI BUN/creatinine 28/1.7, WBC 9.9 platelet 223-RVP negative lactic acid negative    Pertinent imaging/studies till date  11/19 CXR patchy lung opacity?  Atelectasis/infection 11/21 TEE does not show vegetation 11/22 CXR?  Worsening infiltrate   Assessment  & Plan :    Sepsis secondary to staph lugdunensis probably from right upper extremity cellulitis with encephalopathic prodrome Previous MSSA bacteremia in the past 05/2023 admission BCx 2---11/20 negative to date-1 more dose of Ancef  11/23 and then transition to p.o. linezolid  600 twice daily on 12/4 Wound to be reevaluated by me on 11/20 3 AM Patchy opacities on lungs He is wheezing a bit today PTA on Lasix  but was held on admission as below X-ray today confirms  increasing left lower lobe opacity so we will start incentive spirometer continue on Ancef  above and linezolid  should cover AKI on admission superimposed on CKD 3 Underlying BPH Resumed as above Continue Proscar  5 Flomax  0.4 Accidental fall  nondisplaced/minimally displaced olecranon spur? Skin tears Has full range of motion does not require further workup for arm unless something changes and will need x-rays in 2 weeks Per wound care-they recommend Xeroform to wound bed ABD pad and secure with Kerlix soaked with normal saline if adherent Permanent A-fib failing ablation in the past on Eliquis  7 beats SVT this morning Continues on apixaban  2.5 twice daily-rate control with metoprolol  XL 50 Get a.m. magnesium  Low back pain Recent supposed to get ESI's for the back Underlying sundowning/mild dementia according to patient's wife He has some baseline confusion worsening in the evening-wife has caregivers at home  stabilized looks well feels fair and is closer to his baseline Ambulatory dysfunction in the setting of low back pain status post ESI's previously Caution with gabapentin -100 twice daily given advanced age-resumed   Data Reviewed today:   Sodium 140 potassium 3.5 BUN/creatinine 21/1.4 WBC 6.7 hemoglobin 9.8 platelet 187  DVT prophylaxis: Eliquis   Status is: Observation Inpatient    Dispo/Global plan: Unclear   Time 30   Subjective:   Feels well little wheezy no chest pain Some SVT on monitors today No other real complaints  Objective + exam Vitals:   07/15/24 2355 07/16/24 0413 07/16/24 0733 07/16/24 0739  BP: (!) 145/54 (!) 139/56  135/75  Pulse: 72 73  68  Resp: 17 17  19   Temp: 98 F (36.7 C) 98.3 F (36.8 C)  98 F (36.7 C)  TempSrc:      SpO2: 95% 95% 96% 94%  Weight:      Height:      You Filed Weights   07/13/24 0551  Weight: 83.9 kg     Examination: EOMI NCAT no focal deficit no icterus no pallor Does have a scar on his upper chest Right  upper extremity not examined and bandaged Increasing wheeze posterolaterally Abdomen soft no rebound No lower extremity edema   Scheduled Meds:  amLODipine   2.5 mg Oral Daily   apixaban   2.5 mg Oral BID   finasteride   5 mg Oral Daily   fluticasone  furoate-vilanterol  1 puff Inhalation Daily   furosemide   40 mg Oral Daily   gabapentin   100 mg Oral BID   metoprolol  succinate  50 mg Oral Daily   pantoprazole   40 mg Oral Daily   tamsulosin   0.4 mg Oral Daily   Continuous Infusions:   ceFAZolin  (ANCEF ) IV 2 g (07/16/24 1336)   albuterol   Colen Grimes, MD  [facility]

## 2024-07-17 DIAGNOSIS — L03113 Cellulitis of right upper limb: Secondary | ICD-10-CM | POA: Diagnosis not present

## 2024-07-17 LAB — CBC WITH DIFFERENTIAL/PLATELET
Abs Immature Granulocytes: 0.27 K/uL — ABNORMAL HIGH (ref 0.00–0.07)
Basophils Absolute: 0.1 K/uL (ref 0.0–0.1)
Basophils Relative: 1 %
Eosinophils Absolute: 0.1 K/uL (ref 0.0–0.5)
Eosinophils Relative: 2 %
HCT: 30.6 % — ABNORMAL LOW (ref 39.0–52.0)
Hemoglobin: 9.9 g/dL — ABNORMAL LOW (ref 13.0–17.0)
Immature Granulocytes: 4 %
Lymphocytes Relative: 18 %
Lymphs Abs: 1.2 K/uL (ref 0.7–4.0)
MCH: 32.7 pg (ref 26.0–34.0)
MCHC: 32.4 g/dL (ref 30.0–36.0)
MCV: 101 fL — ABNORMAL HIGH (ref 80.0–100.0)
Monocytes Absolute: 1 K/uL (ref 0.1–1.0)
Monocytes Relative: 15 %
Neutro Abs: 4.1 K/uL (ref 1.7–7.7)
Neutrophils Relative %: 60 %
Platelets: 204 K/uL (ref 150–400)
RBC: 3.03 MIL/uL — ABNORMAL LOW (ref 4.22–5.81)
RDW: 15.6 % — ABNORMAL HIGH (ref 11.5–15.5)
WBC: 6.7 K/uL (ref 4.0–10.5)
nRBC: 0 % (ref 0.0–0.2)

## 2024-07-17 LAB — BASIC METABOLIC PANEL WITH GFR
Anion gap: 10 (ref 5–15)
BUN: 24 mg/dL — ABNORMAL HIGH (ref 8–23)
CO2: 25 mmol/L (ref 22–32)
Calcium: 8.3 mg/dL — ABNORMAL LOW (ref 8.9–10.3)
Chloride: 106 mmol/L (ref 98–111)
Creatinine, Ser: 1.5 mg/dL — ABNORMAL HIGH (ref 0.61–1.24)
GFR, Estimated: 44 mL/min — ABNORMAL LOW (ref >60)
Glucose, Bld: 104 mg/dL — ABNORMAL HIGH (ref 70–99)
Potassium: 3.7 mmol/L (ref 3.5–5.1)
Sodium: 141 mmol/L (ref 135–145)

## 2024-07-17 LAB — MAGNESIUM: Magnesium: 1.8 mg/dL (ref 1.7–2.4)

## 2024-07-17 MED ORDER — METRONIDAZOLE 500 MG/100ML IV SOLN: 500.0000 mg | Freq: Two times a day (BID) | INTRAVENOUS | Status: DC

## 2024-07-17 MED ORDER — LEVOFLOXACIN 500 MG PO TABS
750.0000 mg | ORAL_TABLET | ORAL | Status: DC
  Administered 2024-07-17: 750 mg via ORAL
  Filled 2024-07-17: qty 2

## 2024-07-17 MED ORDER — MAGNESIUM OXIDE -MG SUPPLEMENT 400 (240 MG) MG PO TABS
400.0000 mg | ORAL_TABLET | Freq: Two times a day (BID) | ORAL | Status: DC
  Administered 2024-07-17 – 2024-07-18 (×3): 400 mg via ORAL
  Filled 2024-07-17 (×3): qty 1

## 2024-07-17 NOTE — Progress Notes (Signed)
 20:00 Pt ambulated outside room with standby staff assistance and use of walker. Pt ambulated down the hallway to the nurse's station and turned back to go to room. Near pt's room he said he felt his legs were very weak and needed to sit down. Pt sat in chair in hallway about 30 seconds then ambulated to his bed. Pt said he felt short of breath from walking, took ab 5 min to recover lying in bed. HR went into the 140s while ambulating per telemetry report. Vitals stable, no reports of pain. Spouse at bedside. Fall precautions in place, call bell within reach.

## 2024-07-17 NOTE — Plan of Care (Signed)

## 2024-07-17 NOTE — Plan of Care (Signed)
  Problem: Education: Goal: Knowledge of General Education information will improve Description: Including pain rating scale, medication(s)/side effects and non-pharmacologic comfort measures Outcome: Progressing   Problem: Health Behavior/Discharge Planning: Goal: Ability to manage health-related needs will improve Outcome: Progressing   Problem: Clinical Measurements: Goal: Ability to maintain clinical measurements within normal limits will improve Outcome: Progressing   Problem: Clinical Measurements: Goal: Cardiovascular complication will be avoided Outcome: Progressing   Problem: Clinical Measurements: Goal: Respiratory complications will improve Outcome: Progressing   Problem: Activity: Goal: Risk for activity intolerance will decrease Outcome: Progressing   Problem: Safety: Goal: Ability to remain free from injury will improve Outcome: Progressing

## 2024-07-17 NOTE — Progress Notes (Signed)
 [facility]   ROUNDING   NOTE KATE SWEETMAN FMW:[id number]  DOB: 1932-02-07  DOA: 07/13/2024  PCP: [name], MD  07/17/2024,3:23 PM  LOS: 3 days    Code Status: Full code     from: Home   89 year old male Permanent A-fib CHADVASC >4 on Eliquis -ablation X2 [facility] 2010-persistent PAC PVC by monitors 2017 Previous MSSA bacteremia 05/2023 from pneumonia-TEE 06/01/2023 negative for vegetation-received 4 weeks of Ancef  via PICC line ending 06/24/2023 Previous episodes of diverticulitis CKD 3A BPH Last admitted 02/2024 for lower extremity cellulitis,?  On the left lower extremity diverticulitis-blood cultures at the time are negative He has low back pain and was cleared by cardiology 05/19/2024 for bilateral transforaminal ESI's  Chronology  11/17 seen at [facility] urgent care for skin abrasion with bleeding which happened about 11/14 Went to [facility] 11/18 and was discharged from there x-rays were negative 11/19 from home skin tear right elbow increasing confusion hypertensive heart rates-Tmax as high as 101 in the ED subsequently-treated with LR infusion ceftriaxone ---?  Minimally displaced olecranon spur age-indeterminate he had full passive ROM Found to have AKI BUN/creatinine 28/1.7, WBC 9.9 platelet 223-RVP negative lactic acid negative    Pertinent imaging/studies till date  11/19 CXR patchy lung opacity?  Atelectasis/infection 11/21 TEE does not show vegetation 11/22 CXR?  Worsening infiltrate   Assessment  & Plan :    Sepsis secondary to staph lugdunensis probably from right upper extremity cellulitis with encephalopathic prodrome Previous MSSA bacteremia in the past 05/2023 admission Wound reviewed by me and nursing on 11/23 looks quite clean no purulence BCx 2---11/20 negative to date-continuing Ancef  IV continuing monitoring as below Patchy opacities on lungs He is wheezing a bit today PTA on Lasix  but was held on admission hide given 2 doses of Lasix  40 twice daily  but still wheezy We will continue Ancef  and add Flagyl  500 every 12 for gram-negative coverage I think he may be aspirating and we will get SLP to see him as he was coughing more today with meals Two-view x-ray in a.m. AKI on admission superimposed on CKD 3 Underlying BPH Resumed as above Continue Proscar  5 Flomax  0.4 Accidental fall  nondisplaced/minimally displaced olecranon spur? Skin tears Has full ROM-outpatient  x-rays in 2 weeks Per wound care-they recommend Xeroform to wound bed ABD pad and secure with Kerlix soaked with normal saline if adherent-see above description Permanent A-fib failing ablation in the past on Eliquis  No further SVT Continues on apixaban  2.5 twice daily-rate control with metoprolol  XL 50 Replace magnesium  with Mag-Ox 400 twice daily-check magnesium  in a.m. HFpEF Holding home Lasix  for now as was taking only 40 as needed Continues on metoprolol  as above Low back pain Recent supposed to get ESI's for the back Underlying sundowning/mild dementia according to patient's wife He has some baseline confusion-has caregivers at home and wife and family involved Ambulatory dysfunction in the setting of low back pain status post ESI's previously Caution with gabapentin -100 twice daily given advanced age-resumed  Data Reviewed today:   Sodium 141 potassium 3.7 BUN/creatinine 24/1.5 Magnesium  1.8 WBC 6.7 hemoglobin 9.9 platelet 204  DVT prophylaxis: Eliquis   Status is: Observation Inpatient    Dispo/Global plan: Unclear   Time 30   Subjective:   More wheezy and was winded on walking around and required stopping according to nursing He is wheezy and short of breath just by changing position in bed Wound looks good He is eating and drinking quite pleasant and although he wishes to  go home I worry about him worsening of pneumonia so we have changed the antibiotics around  Objective + exam Vitals:   07/17/24 0415 07/17/24 0817 07/17/24 0837 07/17/24 1133   BP: (!) 148/63 (!) 136/56  (!) 132/55  Pulse: 71 94  73  Resp: 16 15  14   Temp: 97.8 F (36.6 C) 98 F (36.7 C)    TempSrc: Oral     SpO2: 95% 95% 99% 95%  Weight:      Height:      You Filed Weights   07/13/24 0551  Weight: 83.9 kg     Examination: Coherent pleasant EOMI NCAT no focal deficit no icterus no pallor Does have a scar on his upper chest Right upper extremity and bandaged and although there is some skin tear it is clean there is no purulence and no discharge A little wheezy posteriorly Abdomen soft no rebound no guarding No lower extremity edema Moving 4 limbs equally   Scheduled Meds:  amLODipine   2.5 mg Oral Daily   apixaban   2.5 mg Oral BID   finasteride   5 mg Oral Daily   fluticasone  furoate-vilanterol  1 puff Inhalation Daily   gabapentin   100 mg Oral BID   magnesium  oxide  400 mg Oral BID   metoprolol  succinate  50 mg Oral Daily   pantoprazole   40 mg Oral Daily   tamsulosin   0.4 mg Oral Daily   Continuous Infusions:   ceFAZolin  (ANCEF ) IV 2 g (07/17/24 1523)   metronidazole      albuterol   Colen Grimes, MD  [facility]

## 2024-07-18 ENCOUNTER — Inpatient Hospital Stay (HOSPITAL_COMMUNITY)

## 2024-07-18 DIAGNOSIS — L03113 Cellulitis of right upper limb: Secondary | ICD-10-CM | POA: Diagnosis not present

## 2024-07-18 LAB — CBC WITH DIFFERENTIAL/PLATELET
Basophils Absolute: 0 K/uL (ref 0.0–0.1)
Basophils Relative: 0 %
Eosinophils Absolute: 0 K/uL (ref 0.0–0.5)
Eosinophils Relative: 0 %
HCT: 31.3 % — ABNORMAL LOW (ref 39.0–52.0)
Hemoglobin: 10.4 g/dL — ABNORMAL LOW (ref 13.0–17.0)
Lymphocytes Relative: 14 %
Lymphs Abs: 1 K/uL (ref 0.7–4.0)
MCH: 33.3 pg (ref 26.0–34.0)
MCHC: 33.2 g/dL (ref 30.0–36.0)
MCV: 100.3 fL — ABNORMAL HIGH (ref 80.0–100.0)
Monocytes Absolute: 0.7 K/uL (ref 0.1–1.0)
Monocytes Relative: 10 %
Neutro Abs: 5.3 K/uL (ref 1.7–7.7)
Neutrophils Relative %: 76 %
Platelets: 240 K/uL (ref 150–400)
RBC: 3.12 MIL/uL — ABNORMAL LOW (ref 4.22–5.81)
RDW: 15.5 % (ref 11.5–15.5)
WBC: 7 K/uL (ref 4.0–10.5)
nRBC: 0 % (ref 0.0–0.2)

## 2024-07-18 LAB — COMPREHENSIVE METABOLIC PANEL WITH GFR
ALT: 9 U/L (ref 0–44)
AST: 31 U/L (ref 15–41)
Albumin: 2.6 g/dL — ABNORMAL LOW (ref 3.5–5.0)
Alkaline Phosphatase: 57 U/L (ref 38–126)
Anion gap: 12 (ref 5–15)
BUN: 25 mg/dL — ABNORMAL HIGH (ref 8–23)
CO2: 28 mmol/L (ref 22–32)
Calcium: 8.3 mg/dL — ABNORMAL LOW (ref 8.9–10.3)
Chloride: 103 mmol/L (ref 98–111)
Creatinine, Ser: 1.6 mg/dL — ABNORMAL HIGH (ref 0.61–1.24)
GFR, Estimated: 41 mL/min — ABNORMAL LOW (ref >60)
Glucose, Bld: 110 mg/dL — ABNORMAL HIGH (ref 70–99)
Potassium: 3.8 mmol/L (ref 3.5–5.1)
Sodium: 143 mmol/L (ref 135–145)
Total Bilirubin: 0.7 mg/dL (ref 0.0–1.2)
Total Protein: 5.4 g/dL — ABNORMAL LOW (ref 6.5–8.1)

## 2024-07-18 LAB — MAGNESIUM: Magnesium: 1.7 mg/dL (ref 1.7–2.4)

## 2024-07-18 LAB — CULTURE, BLOOD (ROUTINE X 2)
Culture: NO GROWTH
Special Requests: ADEQUATE

## 2024-07-18 MED ORDER — ALBUTEROL SULFATE HFA 108 (90 BASE) MCG/ACT IN AERS: 2.0000 | INHALATION_SPRAY | Freq: Four times a day (QID) | RESPIRATORY_TRACT | 2 refills | Status: AC | PRN

## 2024-07-18 MED ORDER — LEVOFLOXACIN 750 MG PO TABS: 750.0000 mg | ORAL_TABLET | ORAL | 0 refills | Status: AC

## 2024-07-18 MED ORDER — LINEZOLID 600 MG PO TABS: 600.0000 mg | ORAL_TABLET | Freq: Two times a day (BID) | ORAL | 0 refills | Status: AC

## 2024-07-18 MED ORDER — SPACER/AERO-HOLDING CHAMBERS DEVI: 1.0000 | 0 refills | Status: AC | PRN

## 2024-07-18 MED ORDER — FUROSEMIDE 40 MG PO TABS: 40.0000 mg | ORAL_TABLET | ORAL | 0 refills | Status: AC

## 2024-07-18 NOTE — Progress Notes (Signed)
 SATURATION QUALIFICATIONS: (This note is used to comply with regulatory documentation for home oxygen)  Patient Saturations on Room Air at Rest = 98%  Patient Saturations on Room Air while Ambulating = 97%

## 2024-07-18 NOTE — Discharge Summary (Signed)
 Physician Discharge Summary  [name] FMW:[id number] DOB: 1931/12/10 DOA: 07/13/2024  PCP: [name], MD  Admit date: 07/13/2024 Discharge date: 07/18/2024  Time spent: 37 minutes  Recommendations for Outpatient Follow-up:  Lasix  dosage changed to 40 4 times daily scheduled-needs Chem-7 magnesium  in about 1 week and readdition of magnesium /potassium if electrolytes are off Suggest outpatient chest x-ray in about 1 month to denote clearing of left lower lobe infiltrate which may be more fluid than pneumonia-if persistence would discuss with the family tapping the same after getting a CT scan Wound care as per wound nurse as below secondary to right upper extremity cellulitis Suggest outpatient x-ray of right arm if difficulty mobilizing the same-minimally displaced olecranon spur noted on admission but patient completely asymptomatic with full ROM Suggest outpatient follow-up with spine doctor for ambulatory dysfunction-ordering home therapy to mobilize him  Discharge Diagnoses:  MAIN problem for hospitalization   Sepsis on admission secondary to staph lugdunensis from upper extremity cellulitis Slight volume overload versus aspiration pneumonia   Please see below for itemized issues addressed in HOpsital- refer to other progress notes for clarity if needed  Discharge Condition: Improved  Diet recommendation: Heart healthy as best possible  Filed Weights   07/13/24 0551  Weight: 83.9 kg    History of present illness:  89 year old male Permanent A-fib CHADVASC >4 on Eliquis -ablation X2 [facility] 2010-persistent PAC PVC by monitors 2017 Previous MSSA bacteremia 05/2023 from pneumonia-TEE 06/01/2023 negative for vegetation-received 4 weeks of Ancef  via PICC line ending 06/24/2023 Previous episodes of diverticulitis CKD 3A BPH Last admitted 02/2024 for lower extremity cellulitis,?  On the left lower extremity diverticulitis-blood cultures at the time are negative He has  low back pain and was cleared by cardiology 05/19/2024 for bilateral transforaminal ESI's   Chronology  11/17 seen at [facility] urgent care for skin abrasion with bleeding which happened about 11/14 Went to [facility] 11/18 and was discharged from there x-rays were negative 11/19 from home skin tear right elbow increasing confusion hypertensive heart rates-Tmax as high as 101 in the ED subsequently-treated with LR infusion ceftriaxone ---?  Minimally displaced olecranon spur age-indeterminate he had full passive ROM Found to have AKI BUN/creatinine 28/1.7, WBC 9.9 platelet 223-RVP negative lactic acid negative      Pertinent imaging/studies till date  11/19 CXR patchy lung opacity?  Atelectasis/infection 11/21 TEE does not show vegetation 11/22 CXR?  Worsening infiltrate 11/23 Cxr-similar persistent left base collapse consolidation with left pleural effusion not really unchanged from the prior study    Assessment  & Plan :      Sepsis secondary to staph lugdunensis probably from right upper extremity cellulitis with encephalopathic prodrome Previous MSSA bacteremia in the past 05/2023 admission Wound reviewed by me and nursing on 11/23 looks quite clean no purulence and was dressed back as per below with the Vashe BCx 2---11/20 negative to date-infectious disease was consulted during hospital stay and felt that with a negative TEE that was performed as above the source was probably skin tear on the right upper extremity They recommended a total of 5 days of Ancef  which we completed and then transition to linezolid  to complete 14-day course on 07/27/2024 HFpEF Lasix  was held on admission-he was given several doses and his breathing improved as per below He continues on metoprolol  as below Patchy opacities on lungs Had periodic wheezes during the hospital stay towards the end of it and this was felt secondary to holding his usual as needed Lasix -in discussion with  his wife, she  states that he has been on intermittent Lasix  and sometimes scheduled Lasix  at home I had speech therapy see the patient and they felt he was cleared to eat and did not think there were any large risks of aspiration--- he was given Levaquin  after I discussed with ID the scenario and he will complete 2 more doses in case this is an infectious etiology although this is less likely Serial x-rays show a slightly persistent left lower lobe atelectasis versus effusion so we did start back Lasix  40 every other day and he should have labs in about a week AKI on admission superimposed on CKD 3 Underlying BPH Continue Proscar  5 Flomax  0.4 Careful initiation of Lasix  as per prior-labs in about 1 week inclusive magnesium  and adjust Lasix  dosing or regimen Accidental fall  nondisplaced/minimally displaced olecranon spur? Skin tears Has full ROM-outpatient  x-rays in 2 weeks Per wound care-they recommend Xeroform to wound bed ABD pad and secure with Kerlix soaked with normal saline if adherent-see above description Permanent A-fib failing ablation in the past on Eliquis  No further SVT Continues on apixaban  2.5 twice daily-rate control with metoprolol  XL 50 Replace magnesium  with Mag-Ox 400 twice daily-recheck magnesium  as an outpatient Low back pain Recent supposed to get ESI's for the back Underlying sundowning/mild dementia according to patient's wife He has some baseline confusion-has caregivers at home and wife and family involved We have asked therapy to evaluate him for home health in the outpatient setting They have sitters at home from 9-11 but he will need PT to regain some function Ambulatory dysfunction in the setting of low back pain status post ESI's previously Caution with gabapentin -100 twice daily given advanced age  Discharge Exam: Vitals:   07/18/24 0843 07/18/24 0932  BP:  126/82  Pulse: 74 74  Resp: 18   Temp:    SpO2: 95%     Subj on day of d/c   Overall looking better no  distress Less wheezy walked in the hallway although was a little bit tired and needed breaks  General Exam on discharge  Awake coherent no distress neck soft supple No wheeze on chest S1-S2 no murmur on monitors he had 5 beats of SVT nonsustained Abdomen is soft no rebound no guarding No lower extremity edema ROM is intact power is 5/5  Discharge Instructions   Discharge Instructions     Diet - low sodium heart healthy   Complete by: As directed    Discharge instructions   Complete by: As directed    Look at your medications carefully several have changed You were diagnosed with a bacterial infection in your bloodstream probably from your hand wound which looks like it is much better I would continue the dressings that have been performed in the hospital and follow-up with your primary physician--- the infectious disease doctor felt that you could go home and complete a course of linezolid  at home over the next several days-take all of this medication and complete it please It would be a very good idea for you to be seen by either your cardiologist or primary physician in a week-noticed that I have placed you on every other day Lasix  for fluid buildup It was not completely sure if you had a pneumonia this hospital stay I think you had wheezing more related to a buildup of fluid from heart failure nonetheless please complete the Levaquin  that we have prescribed I have prescribed you some inhalers with a spacer and this should  be demonstrated to you before you leave-we have resumed most of your other medications without change   We will ask home health to come out and help you mobilize a bit to increase your endurance and please follow-up with your back specialist   Discharge wound care:   Complete by: As directed    Daily      Comments: Cleanse R upper arm wound with Vashe, do not rinse. Apply Xeroform gauze (Lawson [phone]) to wound bed daily, cover with ABD pad and secure with Kerlix  roll gauze.  Soak dressing with normal saline if adhered to wound bed for atraumatic removal.  07/15/24 1719   Increase activity slowly   Complete by: As directed       Allergies as of 07/18/2024       Reactions   Losartan  Nausea And Vomiting, Other (See Comments)   Elevated creatinine levels        Medication List     STOP taking these medications    potassium chloride  10 MEQ tablet Commonly known as: KLOR-CON        TAKE these medications    acetaminophen  500 MG tablet Commonly known as: TYLENOL  Take 500 mg by mouth every 6 (six) hours as needed for mild pain.   albuterol  108 (90 Base) MCG/ACT inhaler Commonly known as: VENTOLIN  HFA Inhale 2 puffs into the lungs every 6 (six) hours as needed for wheezing or shortness of breath.   amLODipine  2.5 MG tablet Commonly known as: NORVASC  TAKE 1 TABLET DAILY   apixaban  2.5 MG Tabs tablet Commonly known as: Eliquis  Take 1 tablet (2.5 mg total) by mouth 2 (two) times daily.   budesonide -formoterol  80-4.5 MCG/ACT inhaler Commonly known as: Breyna  Inhale 2 puffs into the lungs in the morning and at bedtime.   finasteride  5 MG tablet Commonly known as: PROSCAR  TAKE 1 TABLET (5 MG TOTAL) BY MOUTH DAILY.   furosemide  40 MG tablet Commonly known as: LASIX  Take 1 tablet (40 mg total) by mouth every other day. TAKE 1 TABLET EVERY DAY AS NEEDED FOR LEG SWELLING What changed: See the new instructions.   gabapentin  100 MG capsule Commonly known as: NEURONTIN  Take 100 mg by mouth 2 (two) times daily.   glucosamine-chondroitin 500-400 MG tablet Take 2 tablets by mouth daily.   lansoprazole  30 MG capsule Commonly known as: PREVACID  TAKE 1 CAPSULE EVERY DAY AT 12 NOON. What changed:  how much to take how to take this when to take this additional instructions   levofloxacin  750 MG tablet Commonly known as: LEVAQUIN  Take 1 tablet (750 mg total) by mouth every other day for 2 doses. Start taking on: July 19, 2024   linezolid  600 MG tablet Commonly known as: ZYVOX  Take 1 tablet (600 mg total) by mouth 2 (two) times daily for 9 days.   lovastatin  20 MG tablet Commonly known as: MEVACOR  TAKE 1 TABLET BY MOUTH EVERYDAY AT BEDTIME What changed: See the new instructions.   metoprolol  succinate 50 MG 24 hr tablet Commonly known as: TOPROL -XL TAKE 1 TABLET BY MOUTH EVERY DAY WITH OR IMMEDIATELY FOLLOWING A MEAL What changed: See the new instructions.   Spacer/Aero-Holding Raguel French 1 each by Does not apply route as needed.   tamsulosin  0.4 MG Caps capsule Commonly known as: FLOMAX  Take 1 capsule (0.4 mg total) by mouth daily. What changed: when to take this   vitamin B-12 500 MCG tablet Commonly known as: CYANOCOBALAMIN  Take 500 mcg by mouth daily.   VITAMIN  D PO Take 400 Units by mouth daily.               Discharge Care Instructions  (From admission, onward)           Start     Ordered   07/18/24 0000  Discharge wound care:       Comments: Daily      Comments: Cleanse R upper arm wound with Vashe, do not rinse. Apply Xeroform gauze (Lawson [phone]) to wound bed daily, cover with ABD pad and secure with Kerlix roll gauze.  Soak dressing with normal saline if adhered to wound bed for atraumatic removal.  07/15/24 1719   07/18/24 1119           Allergies  Allergen Reactions   Losartan  Nausea And Vomiting and Other (See Comments)    Elevated creatinine levels       The results of significant diagnostics from this hospitalization (including imaging, microbiology, ancillary and laboratory) are listed below for reference.    Significant Diagnostic Studies: DG Chest 2 View Result Date: 07/18/2024 CLINICAL DATA:  Pneumonia. EXAM: CHEST - 2 VIEW COMPARISON:  07/16/2024 FINDINGS: Cardiopericardial silhouette is at upper limits of normal for size. Left base collapse/consolidation is similar to prior with persistent small left pleural effusion. Right lung clear. No  acute bony abnormality. Telemetry leads overlie the chest. IMPRESSION: Similar persistent left base collapse/consolidation with small left pleural effusion. Electronically Signed   By: Camellia Candle M.D.   On: 07/18/2024 06:37   DG Chest 2 View Result Date: 07/16/2024 EXAM: 2 VIEW(S) XRAY OF THE CHEST 07/16/2024 02:21:00 PM COMPARISON: None available. CLINICAL HISTORY: [id number] PNA (Pneumonia) [id number] PNA (Pneumonia) FINDINGS: LUNGS AND PLEURA: Increasing infiltration in the left lower lobe, probably due to pneumonia, but could indicate compressive atelectasis. No pleural effusion. No pneumothorax. HEART AND MEDIASTINUM: Heart size and pulmonary vascularity are normal. No acute abnormality of the cardiac and mediastinal silhouettes. BONES AND SOFT TISSUES: Degenerative changes in the spine and shoulders. No acute osseous abnormality. IMPRESSION: 1. Increasing left lower lobe opacity, most consistent with pneumonia, though compressive atelectasis remains a consideration. Electronically signed by: Elsie Gravely MD 07/16/2024 02:25 PM EST RP Workstation: [id number]   EP STUDY Result Date: 07/16/2024 See surgical note for result.  ECHO TEE Result Date: 07/15/2024    TRANSESOPHOGEAL ECHO REPORT   Patient Name:   [name] Date of Exam: 07/15/2024 Medical Rec #:  [id number]        Height:       74.0 in Accession #:    [id number]       Weight:       185.0 lb Date of Birth:  [birth year]        BSA:          2.103 m Patient Age:    89 years         BP:           147/64 mmHg Patient Gender: M                HR:           85 bpm. Exam Location:  Inpatient Procedure: 2D Echo, Cardiac Doppler, Color Doppler and 3D Echo (Both Spectral            and Color Flow Doppler were utilized during procedure). Indications:     Bacteremia  History:         Patient has prior history of Echocardiogram examinations, most  recent 07/14/2024.  Sonographer:     Tinnie Gosling RDCS Referring Phys:  [id number] MADONNA LARGE Diagnosing Phys: Madonna Large PROCEDURE: After discussion of the risks and benefits of a TEE, an informed consent was obtained Wife. The transesophogeal probe was passed without difficulty through the esophogus of the patient. Imaged were obtained with the patient in a left lateral decubitus position. Sedation performed by different physician. The patient was monitored while under deep sedation. Anesthestetic sedation was provided intravenously by Anesthesiology: 318mg  of Propofol . Image quality was good. The patient's vital signs;  including heart rate, blood pressure, and oxygen saturation; remained stable throughout the procedure. The patient developed no complications during the procedure.  IMPRESSIONS  1. Left ventricular ejection fraction, by estimation, is 60 to 65%. The left ventricle has normal function. The left ventricle has no regional wall motion abnormalities. Left ventricular diastolic function could not be evaluated.  2. Right ventricular systolic function is normal. The right ventricular size is normal.  3. No left atrial/left atrial appendage thrombus was detected. The LAA emptying velocity was 40 cm/s.  4. The mitral valve is degenerative. Trivial mitral valve regurgitation. No evidence of mitral stenosis.  5. The aortic valve is tricuspid. Aortic valve regurgitation is not visualized. Aortic valve sclerosis is present, with no evidence of aortic valve stenosis.  6. There is mild (Grade II) layered plaque involving the descending aorta.  7. 3D images of the mitral and aortic valve does not show vegetation. Conclusion(s)/Recommendation(s): No evidence of vegetation/infective endocarditis on this transesophageael echocardiogram. FINDINGS  Left Ventricle: Left ventricular ejection fraction, by estimation, is 60 to 65%. The left ventricle has normal function. The left ventricle has no regional wall motion abnormalities. The left ventricular internal cavity size was normal in size. There is  no  left ventricular hypertrophy. Left ventricular diastolic function could not be evaluated due to nondiagnostic images. Left ventricular diastolic function could not be evaluated. Right Ventricle: The right ventricular size is normal. Right vetricular wall thickness was not well visualized. Right ventricular systolic function is normal. Left Atrium: Left atrial size was normal in size. No left atrial/left atrial appendage thrombus was detected. The LAA emptying velocity was 40 cm/s. Right Atrium: Right atrial size was normal in size. Pericardium: Trivial pericardial effusion is present. Mitral Valve: The mitral valve is degenerative in appearance. Trivial mitral valve regurgitation. No evidence of mitral valve stenosis. MV peak gradient, 4.0 mmHg. The mean mitral valve gradient is 2.0 mmHg. Tricuspid Valve: The tricuspid valve is grossly normal. Tricuspid valve regurgitation is mild . No evidence of tricuspid stenosis. Aortic Valve: The aortic valve is tricuspid. Aortic valve regurgitation is not visualized. Aortic valve sclerosis is present, with no evidence of aortic valve stenosis. Aortic valve mean gradient measures 3.0 mmHg. Aortic valve peak gradient measures 5.2  mmHg. Aortic valve area, by VTI measures 2.07 cm. Pulmonic Valve: The pulmonic valve was grossly normal. Pulmonic valve regurgitation is mild. No evidence of pulmonic stenosis. Aorta: The aortic root and ascending aorta are structurally normal, with no evidence of dilitation. There is mild (Grade II) layered plaque involving the descending aorta. Venous: The left upper pulmonary vein is normal. IAS/Shunts: The atrial septum is grossly normal. Additional Comments: Spectral Doppler performed. LEFT VENTRICLE PLAX 2D LVOT diam:     1.90 cm LV SV:         46 LV SV Index:   22 LVOT Area:     2.84 cm  AORTIC VALVE AV Area (Vmax):  2.26 cm AV Area (Vmean):   1.87 cm AV Area (VTI):     2.07 cm AV Vmax:           114.00 cm/s AV Vmean:          84.200 cm/s  AV VTI:            0.221 m AV Peak Grad:      5.2 mmHg AV Mean Grad:      3.0 mmHg LVOT Vmax:         91.00 cm/s LVOT Vmean:        55.600 cm/s LVOT VTI:          0.161 m LVOT/AV VTI ratio: 0.73  AORTA Ao Root diam: 3.50 cm Ao Asc diam:  3.15 cm MITRAL VALVE MV Area (PHT): 4.31 cm    SHUNTS MV Area VTI:   1.65 cm    Systemic VTI:  0.16 m MV Peak grad:  4.0 mmHg    Systemic Diam: 1.90 cm MV Mean grad:  2.0 mmHg MV Vmax:       1.00 m/s MV Vmean:      59.4 cm/s MV Decel Time: 176 msec MV E velocity: 81.00 cm/s MV A velocity: 74.80 cm/s MV E/A ratio:  1.08 Sunit Tolia Electronically signed by Madonna Large Signature Date/Time: 07/15/2024/2:29:13 PM    Final    ECHOCARDIOGRAM COMPLETE Result Date: 07/14/2024    ECHOCARDIOGRAM REPORT   Patient Name:   [name] Date of Exam: 07/14/2024 Medical Rec #:  [id number]        Height:       74.0 in Accession #:    [id number]       Weight:       185.0 lb Date of Birth:  12/18/1931        BSA:          2.103 m Patient Age:    89 years         BP:           143/61 mmHg Patient Gender: M                HR:           66 bpm. Exam Location:  Inpatient Procedure: 2D Echo, Cardiac Doppler, Color Doppler and Intracardiac            Opacification Agent (Both Spectral and Color Flow Doppler were            utilized during procedure). Indications:    Bacteremia  History:        Patient has prior history of Echocardiogram examinations, most                 recent 05/28/2023. Risk Factors:Hypertension and Dyslipidemia.                 CKD.  Sonographer:    Philomena Daring Referring Phys: [id number] TRUNG T VU IMPRESSIONS  1. Left ventricular ejection fraction, by estimation, is 60 to 65%. The left ventricle has normal function. Left ventricular endocardial border not optimally defined to evaluate regional wall motion. There is mild left ventricular hypertrophy. Left ventricular diastolic parameters are indeterminate.  2. Right ventricular systolic function is normal. The right ventricular  size is normal. There is normal pulmonary artery systolic pressure. The estimated right ventricular systolic pressure is 33.2 mmHg.  3. The mitral valve is degenerative. Trivial mitral valve regurgitation. No evidence of mitral stenosis.  4. The aortic valve  is normal in structure. There is mild calcification of the aortic valve. Aortic valve regurgitation is not visualized. No aortic stenosis is present.  5. The inferior vena cava is normal in size with <50% respiratory variability, suggesting right atrial pressure of 8 mmHg. FINDINGS  Left Ventricle: Left ventricular ejection fraction, by estimation, is 60 to 65%. The left ventricle has normal function. Left ventricular endocardial border not optimally defined to evaluate regional wall motion. Definity  contrast agent was given IV to delineate the left ventricular endocardial borders. The left ventricular internal cavity size was normal in size. There is mild left ventricular hypertrophy. Left ventricular diastolic parameters are indeterminate. Right Ventricle: The right ventricular size is normal. No increase in right ventricular wall thickness. Right ventricular systolic function is normal. There is normal pulmonary artery systolic pressure. The tricuspid regurgitant velocity is 2.51 m/s, and  with an assumed right atrial pressure of 8 mmHg, the estimated right ventricular systolic pressure is 33.2 mmHg. Left Atrium: Left atrial size was normal in size. Right Atrium: Right atrial size was normal in size. Pericardium: There is no evidence of pericardial effusion. Mitral Valve: The mitral valve is degenerative in appearance. Mild to moderate mitral annular calcification. Trivial mitral valve regurgitation. No evidence of mitral valve stenosis. Tricuspid Valve: The tricuspid valve is normal in structure. Tricuspid valve regurgitation is mild . No evidence of tricuspid stenosis. Aortic Valve: The aortic valve is normal in structure. There is mild calcification of the  aortic valve. Aortic valve regurgitation is not visualized. No aortic stenosis is present. Pulmonic Valve: The pulmonic valve was normal in structure. Pulmonic valve regurgitation is trivial. No evidence of pulmonic stenosis. Aorta: The aortic root is normal in size and structure. Venous: The inferior vena cava is normal in size with less than 50% respiratory variability, suggesting right atrial pressure of 8 mmHg. IAS/Shunts: No atrial level shunt detected by color flow Doppler.  LEFT VENTRICLE PLAX 2D LVIDd:         5.30 cm   Diastology LVIDs:         3.60 cm   LV e' medial:    4.68 cm/s LV PW:         1.20 cm   LV E/e' medial:  22.4 LV IVS:        1.00 cm   LV e' lateral:   7.83 cm/s LVOT diam:     2.20 cm   LV E/e' lateral: 13.4 LV SV:         70 LV SV Index:   33 LVOT Area:     3.80 cm  RIGHT VENTRICLE             IVC RV S prime:     10.80 cm/s  IVC diam: 1.90 cm TAPSE (M-mode): 2.0 cm LEFT ATRIUM             Index        RIGHT ATRIUM           Index LA diam:        4.00 cm 1.90 cm/m   RA Area:     11.90 cm LA Vol (A2C):   37.9 ml 18.03 ml/m  RA Volume:   23.70 ml  11.27 ml/m LA Vol (A4C):   35.7 ml 16.98 ml/m LA Biplane Vol: 36.8 ml 17.50 ml/m  AORTIC VALVE LVOT Vmax:   83.70 cm/s LVOT Vmean:  55.600 cm/s LVOT VTI:    0.185 m  AORTA Ao Root diam: 3.70 cm  Ao Asc diam:  3.30 cm MITRAL VALVE                TRICUSPID VALVE MV Area (PHT): 2.58 cm     TR Peak grad:   25.2 mmHg MV Decel Time: 294 msec     TR Vmax:        251.00 cm/s MV E velocity: 105.00 cm/s MV A velocity: 76.50 cm/s   SHUNTS MV E/A ratio:  1.37         Systemic VTI:  0.18 m                             Systemic Diam: 2.20 cm Soyla Merck MD Electronically signed by Soyla Merck MD Signature Date/Time: 07/14/2024/2:34:28 PM    Final    DG Chest Port 1 View Result Date: 07/13/2024 EXAM: 1 VIEW(S) XRAY OF THE CHEST 07/13/2024 06:11:43 AM COMPARISON: 03/04/2024 CLINICAL HISTORY: Questionable sepsis - evaluate for abnormality FINDINGS:  LUNGS AND PLEURA: Persistent low lung volumes. Stable left mid lung scarring since 2023. Patchy lung base opacity more resembles atelectasis than infection. No pleural effusion. No pneumothorax. HEART AND MEDIASTINUM: No acute abnormality of the cardiac and mediastinal silhouettes. BONES AND SOFT TISSUES: No acute osseous abnormality. IMPRESSION: 1. Patchy lung base opacity more resembles atelectasis than infection. Electronically signed by: Helayne Hurst MD 07/13/2024 06:18 AM EST RP Workstation: [id number]   DG Hand Complete Right Result Date: 07/12/2024 EXAM: 3 OR MORE VIEW(S) XRAY OF THE RIGHT HAND 07/12/2024 03:34:00 PM COMPARISON: None available. CLINICAL HISTORY: Injury FINDINGS: BONES AND JOINTS: No acute fracture. No focal osseous lesion. No joint dislocation. SOFT TISSUES: The soft tissues are unremarkable. IMPRESSION: 1. No acute osseous abnormality. Electronically signed by: Lynwood Seip MD 07/12/2024 03:55 PM EST RP Workstation: [id number]   DG Forearm Right Result Date: 07/12/2024 EXAM: VIEW(S) XRAY OF THE RIGHT FOREARM 07/12/2024 03:34:00 PM COMPARISON: None available. CLINICAL HISTORY: injury FINDINGS: FINDINGS: BONES AND JOINTS: Possible minimally displaced fracture involving olecranon spur of indeterminate age. No focal osseous lesion. No joint dislocation. SOFT TISSUES: The soft tissues are unremarkable. IMPRESSION: 1. Possible minimally displaced fracture involving the right olecranon spur, age indeterminate. Electronically signed by: Lynwood Seip MD 07/12/2024 03:53 PM EST RP Workstation: [id number]   DG Elbow Complete Right Result Date: 07/12/2024 EXAM: 3 VIEW(S) XRAY OF THE RIGHT ELBOW COMPARISON: None available. CLINICAL HISTORY: injury FINDINGS: BONES AND JOINTS: Possible minimally displaced fracture involving olecranon spur of indeterminate age. No focal osseous lesion. No joint dislocation. No joint effusion. SOFT TISSUES: The soft tissues are unremarkable. IMPRESSION: 1. Possible  minimally displaced fracture of the olecranon spur, age indeterminate. Electronically signed by: Lynwood Seip MD 07/12/2024 03:52 PM EST RP Workstation: [id number]    Microbiology: Recent Results (from the past 240 hours)  Culture, blood (Routine x 2)     Status: Abnormal   Collection Time: 07/13/24  5:49 AM   Specimen: BLOOD LEFT ARM  Result Value Ref Range Status   Specimen Description BLOOD LEFT ARM  Final   Special Requests   Final    BOTTLES DRAWN AEROBIC AND ANAEROBIC Blood Culture adequate volume   Culture  Setup Time   Final    GRAM POSITIVE COCCI AEROBIC BOTTLE ONLY [facility]  PHARM D 07/14/2024 @ 0649 BY DD Performed at [facility], [address], [place], [place] [address]    Culture STAPHYLOCOCCUS LUGDUNENSIS (A)  Final   Report Status 07/16/2024 FINAL  Final   Organism ID, Bacteria STAPHYLOCOCCUS LUGDUNENSIS  Final      Susceptibility   Staphylococcus lugdunensis - MIC*    CIPROFLOXACIN <=0.5 SENSITIVE Sensitive     ERYTHROMYCIN <=0.25 SENSITIVE Sensitive     GENTAMICIN <=0.5 SENSITIVE Sensitive     OXACILLIN 2 SENSITIVE Sensitive     TETRACYCLINE >=16 RESISTANT Resistant     VANCOMYCIN  <=0.5 SENSITIVE Sensitive     TRIMETH/SULFA <=10 SENSITIVE Sensitive     CLINDAMYCIN <=0.25 SENSITIVE Sensitive     RIFAMPIN <=0.5 SENSITIVE Sensitive     Inducible Clindamycin NEGATIVE Sensitive     * STAPHYLOCOCCUS LUGDUNENSIS  Blood Culture ID Panel (Reflexed)     Status: Abnormal   Collection Time: 07/13/24  5:49 AM  Result Value Ref Range Status   Enterococcus faecalis NOT DETECTED NOT DETECTED Final   Enterococcus Faecium NOT DETECTED NOT DETECTED Final   Listeria monocytogenes NOT DETECTED NOT DETECTED Final   Staphylococcus species DETECTED (A) NOT DETECTED Final    Comment: CRITICAL RESULT CALLED TO, READ BACK BY AND VERIFIED WITH: RBV [facility]  PHARM D 07/14/2024 @ 0649 BY DD    Staphylococcus aureus (BCID) NOT DETECTED NOT DETECTED Final   Staphylococcus  epidermidis NOT DETECTED NOT DETECTED Final   Staphylococcus lugdunensis DETECTED (A) NOT DETECTED Final    Comment: CRITICAL RESULT CALLED TO, READ BACK BY AND VERIFIED WITH: RBV [facility]  PHARM D 07/14/2024 @ 0649 BY DD    Streptococcus species NOT DETECTED NOT DETECTED Final   Streptococcus agalactiae NOT DETECTED NOT DETECTED Final   Streptococcus pneumoniae NOT DETECTED NOT DETECTED Final   Streptococcus pyogenes NOT DETECTED NOT DETECTED Final   A.calcoaceticus-baumannii NOT DETECTED NOT DETECTED Final   Bacteroides fragilis NOT DETECTED NOT DETECTED Final   Enterobacterales NOT DETECTED NOT DETECTED Final   Enterobacter cloacae complex NOT DETECTED NOT DETECTED Final   Escherichia coli NOT DETECTED NOT DETECTED Final   Klebsiella aerogenes NOT DETECTED NOT DETECTED Final   Klebsiella oxytoca NOT DETECTED NOT DETECTED Final   Klebsiella pneumoniae NOT DETECTED NOT DETECTED Final   Proteus species NOT DETECTED NOT DETECTED Final   Salmonella species NOT DETECTED NOT DETECTED Final   Serratia marcescens NOT DETECTED NOT DETECTED Final   Haemophilus influenzae NOT DETECTED NOT DETECTED Final   Neisseria meningitidis NOT DETECTED NOT DETECTED Final   Pseudomonas aeruginosa NOT DETECTED NOT DETECTED Final   Stenotrophomonas maltophilia NOT DETECTED NOT DETECTED Final   Candida albicans NOT DETECTED NOT DETECTED Final   Candida auris NOT DETECTED NOT DETECTED Final   Candida glabrata NOT DETECTED NOT DETECTED Final   Candida krusei NOT DETECTED NOT DETECTED Final   Candida parapsilosis NOT DETECTED NOT DETECTED Final   Candida tropicalis NOT DETECTED NOT DETECTED Final   Cryptococcus neoformans/gattii NOT DETECTED NOT DETECTED Final   Methicillin resistance mecA/C NOT DETECTED NOT DETECTED Final    Comment: Performed at [facility], [address] [place], [place] [address]  Culture, blood (Routine x 2)     Status: None   Collection Time: 07/13/24  5:53 AM   Specimen: BLOOD  LEFT ARM  Result Value Ref Range Status   Specimen Description BLOOD LEFT ARM  Final   Special Requests   Final    BOTTLES DRAWN AEROBIC AND ANAEROBIC Blood Culture adequate volume   Culture   Final    NO GROWTH 5 DAYS Performed at [facility], [address] [place], [place] [address]    Report  Status 07/18/2024 FINAL  Final  Resp panel by RT-PCR (RSV, Flu A&B, Covid) Anterior Nasal Swab     Status: None   Collection Time: 07/13/24  5:53 AM   Specimen: Anterior Nasal Swab  Result Value Ref Range Status   SARS Coronavirus 2 by RT PCR NEGATIVE NEGATIVE Final   Influenza A by PCR NEGATIVE NEGATIVE Final   Influenza B by PCR NEGATIVE NEGATIVE Final    Comment: (NOTE) The Xpert Xpress SARS-CoV-2/FLU/RSV plus assay is intended as an aid in the diagnosis of influenza from Nasopharyngeal swab specimens and should not be used as a sole basis for treatment. Nasal washings and aspirates are unacceptable for Xpert Xpress SARS-CoV-2/FLU/RSV testing.  Fact Sheet for Patients: bloggercourse.com  Fact Sheet for Healthcare Providers: seriousbroker.it  This test is not yet approved or cleared by the [place]  FDA and has been authorized for detection and/or diagnosis of SARS-CoV-2 by FDA under an Emergency Use Authorization (EUA). This EUA will remain in effect (meaning this test can be used) for the duration of the COVID-19 declaration under Section 564(b)(1) of the Act, 21 U.S.C. section 360bbb-3(b)(1), unless the authorization is terminated or revoked.     Resp Syncytial Virus by PCR NEGATIVE NEGATIVE Final    Comment: (NOTE) Fact Sheet for Patients: bloggercourse.com  Fact Sheet for Healthcare Providers: seriousbroker.it  This test is not yet approved or cleared by the [place]  FDA and has been authorized for detection and/or diagnosis of SARS-CoV-2 by FDA under an  Emergency Use Authorization (EUA). This EUA will remain in effect (meaning this test can be used) for the duration of the COVID-19 declaration under Section 564(b)(1) of the Act, 21 U.S.C. section 360bbb-3(b)(1), unless the authorization is terminated or revoked.  Performed at [facility], [address], [place], [place] [address]   Culture, blood (Routine X 2) w Reflex to ID Panel     Status: None (Preliminary result)   Collection Time: 07/14/24 10:51 AM   Specimen: BLOOD RIGHT ARM  Result Value Ref Range Status   Specimen Description BLOOD RIGHT ARM  Final   Special Requests   Final    BOTTLES DRAWN AEROBIC AND ANAEROBIC Blood Culture adequate volume   Culture   Final    NO GROWTH 4 DAYS Performed at [facility], [address], [place], [place] [address]    Report Status PENDING  Incomplete  Culture, blood (Routine X 2) w Reflex to ID Panel     Status: None (Preliminary result)   Collection Time: 07/14/24 10:52 AM   Specimen: BLOOD RIGHT HAND  Result Value Ref Range Status   Specimen Description BLOOD RIGHT HAND  Final   Special Requests   Final    BOTTLES DRAWN AEROBIC AND ANAEROBIC Blood Culture adequate volume   Culture   Final    NO GROWTH 4 DAYS Performed at [facility], [address], [place], [place] [address]    Report Status PENDING  Incomplete     Labs: Basic Metabolic Panel: Recent Labs  Lab 07/14/24 0745 07/15/24 0554 07/16/24 0528 07/17/24 0508 07/18/24 0524  NA 143 138 140 141 143  K 3.8 3.9 3.5 3.7 3.8  CL 104 103 105 106 103  CO2 24 23 23 25 28   GLUCOSE 93 107* 108* 104* 110*  BUN 23 20 21  24* 25*  CREATININE 1.40* 1.48* 1.45* 1.50* 1.60*  CALCIUM 8.3* 8.3* 8.2* 8.3* 8.3*  MG  --   --   --  1.8 1.7   Liver Function Tests: Recent Labs  Lab 07/13/24 0545 07/14/24 0745 07/18/24 0524  AST 20 24 31   ALT 9 11 9   ALKPHOS 56 47 57  BILITOT 1.2 0.5 0.7  PROT 6.3* 5.0* 5.4*  ALBUMIN 3.7 2.8* 2.6*   No results for  input(s): LIPASE, AMYLASE in the last 168 hours. No results for input(s): AMMONIA in the last 168 hours. CBC: Recent Labs  Lab 07/14/24 0745 07/15/24 0554 07/16/24 0528 07/17/24 0508 07/18/24 0524  WBC 5.8 8.3 6.7 6.7 7.0  NEUTROABS 2.7 5.4 3.8 4.1 5.3  HGB 9.8* 10.4* 9.8* 9.9* 10.4*  HCT 30.1* 31.6* 30.1* 30.6* 31.3*  MCV 102.4* 101.0* 102.0* 101.0* 100.3*  PLT 175 194 187 204 240   Cardiac Enzymes: No results for input(s): CKTOTAL, CKMB, CKMBINDEX, TROPONINI in the last 168 hours. BNP: BNP (last 3 results) No results for input(s): BNP in the last 8760 hours.  ProBNP (last 3 results) No results for input(s): PROBNP in the last 8760 hours.  CBG: No results for input(s): GLUCAP in the last 168 hours.  Signed:  Colen Grimes MD   [facility] 07/18/2024, 11:19 AM

## 2024-07-18 NOTE — Plan of Care (Signed)

## 2024-07-18 NOTE — Evaluation (Signed)
 Clinical/Bedside Swallow Evaluation Patient Details  Name: [name] MRN: [id number] Date of Birth: 1932-06-28  Today's Date: 07/18/2024 Time: SLP Start Time (ACUTE ONLY): 1009 SLP Stop Time (ACUTE ONLY): 1020 SLP Time Calculation (min) (ACUTE ONLY): 11 min  Past Medical History:  Past Medical History:  Diagnosis Date   Actinic keratosis 03/13/2009   Arthritis    BENIGN PROSTATIC HYPERTROPHY, HX OF 04/07/2009   CARCINOMA, SKIN, SQUAMOUS CELL 09/11/2009   COLONIC POLYPS, HX OF 03/13/2009   Essential hypertension    GERD 03/13/2009   Hyperlipidemia    INSOMNIA, CHRONIC 09/11/2009   Persistent atrial fibrillation (HCC) 04/07/2009   a. s/p afib ablation at [facility] x 2 in 2010.   POLYPECTOMY, HX OF 04/07/2009   Premature atrial contractions    PULMONARY NODULE 09/11/2009   PVC's (premature ventricular contractions)    Rosacea 03/13/2009   SKIN CANCER, HX OF 03/13/2009   Past Surgical History:  Past Surgical History:  Procedure Laterality Date   ABLATION  2010   x2   COLONOSCOPY W/ BIOPSIES AND POLYPECTOMY     EYE SURGERY Bilateral    cataracts   LUMBAR DISC SURGERY     RUPTURE   LUMBAR LAMINECTOMY/DECOMPRESSION MICRODISCECTOMY N/A 04/20/2014   Procedure: LUMBAR TWO-THREE, LUMBAR THREE-FOUR, LUMAR FOUR-FIVE LAMINECTOMIES;  Surgeon: Reyes Budge, MD;  Location: [facility] NEURO ORS;  Service: Neurosurgery;  Laterality: N/A;   POLYPECTOMY     SKIN CANCER EXCISION     TEE WITHOUT CARDIOVERSION N/A 06/01/2023   Procedure: TRANSESOPHAGEAL ECHOCARDIOGRAM;  Surgeon: Okey Vina GAILS, MD;  Location: [facility] INVASIVE CV LAB;  Service: Cardiovascular;  Laterality: N/A;   TRANSESOPHAGEAL ECHOCARDIOGRAM (CATH LAB) N/A 07/15/2024   Procedure: TRANSESOPHAGEAL ECHOCARDIOGRAM;  Surgeon: Michele Richardson, DO;  Location: [facility] INVASIVE CV LAB;  Service: Cardiovascular;  Laterality: N/A;   HPI:  89 y.o. male presented to ED with fever, sepsis, RUE cellulitis several days after falling and sustaining skin tear/injury to  right elbow. Concern for potential pna per CXR; coughing with meals. PMHx mild dementia, HTN, afib.    Assessment / Plan / Recommendation  Clinical Impression  Pt presents with functional swallowing. Oral mechanism exam is normal. He demonstrated thorough mastication, the appearance of a brisk swallow, and no s/s of aspiration, neither overt nor subtle.  Despite advanced age, he has very few comorbidities that make him especially susceptible to developing an aspiration pna. Continue regular diet/thin liquids.  No SLP f/u is needed. D/W family   SLP Visit Diagnosis: Dysphagia, unspecified (R13.10)    Aspiration Risk  No limitations    Diet Recommendation   Thin;Age appropriate regular  Medication Administration: Whole meds with liquid    Other  Recommendations Oral Care Recommendations: Oral care BID       Swallow Study   General Date of Onset: 07/13/24 HPI: 89 y.o. male presented to ED with fever, sepsis, RUE cellulitis several days after falling and sustaining skin tear/injury to right elbow. Concern for potential pna per CXR; coughing with meals. PMHx mild dementia, HTN, afib. Type of Study: Bedside Swallow Evaluation Previous Swallow Assessment: no Diet Prior to this Study: Regular;Thin liquids (Level 0) Temperature Spikes Noted: No Respiratory Status: Room air History of Recent Intubation: No Behavior/Cognition: Alert;Cooperative;Pleasant mood Oral Cavity Assessment: Within Functional Limits Oral Care Completed by SLP: No Oral Cavity - Dentition: Dentures, top;Dentures, bottom Vision: Functional for self-feeding Self-Feeding Abilities: Able to feed self Patient Positioning: Upright in bed Baseline Vocal Quality: Normal Volitional Cough: Strong Volitional Swallow: Able to elicit  Oral/Motor/Sensory Function Overall Oral Motor/Sensory Function: Within functional limits   Ice Chips Ice chips: Within functional limits   Thin Liquid Thin Liquid: Within functional limits     Nectar Thick Nectar Thick Liquid: Not tested   Honey Thick Honey Thick Liquid: Not tested   Puree Puree: Within functional limits   Solid     Solid: Within functional limits      [name] 07/18/2024,10:30 AM  [name], MA CCC/SLP Clinical Specialist - Acute Care SLP Acute Rehabilitation Services Office number [phone]

## 2024-07-18 NOTE — Plan of Care (Signed)
  Problem: Clinical Measurements: Goal: Will remain free from infection Outcome: Progressing   Problem: Clinical Measurements: Goal: Respiratory complications will improve Outcome: Progressing   Problem: Activity: Goal: Risk for activity intolerance will decrease Outcome: Progressing   

## 2024-07-19 LAB — CULTURE, BLOOD (ROUTINE X 2)
Culture: NO GROWTH
Culture: NO GROWTH
Special Requests: ADEQUATE
Special Requests: ADEQUATE

## 2024-07-19 NOTE — Care Management Important Message (Signed)
 Important Message  Patient Details  Name: [name] MRN: [id number] Date of Birth: [birth year]   Important Message Given:  No     [name] 07/19/2024, 8:39 AM

## 2024-07-19 NOTE — Progress Notes (Signed)
 Patient discharged home, Important Message Letter mailed to patient.

## 2024-07-26 ENCOUNTER — Ambulatory Visit: Admitting: Family Medicine

## 2024-07-26 ENCOUNTER — Encounter: Payer: Self-pay | Admitting: Family Medicine

## 2024-07-26 VITALS — BP 138/62 | HR 88 | Temp 97.7°F | Wt 184.1 lb

## 2024-07-26 DIAGNOSIS — R600 Localized edema: Secondary | ICD-10-CM | POA: Diagnosis not present

## 2024-07-26 DIAGNOSIS — Z5181 Encounter for therapeutic drug level monitoring: Secondary | ICD-10-CM

## 2024-07-26 DIAGNOSIS — S50311D Abrasion of right elbow, subsequent encounter: Secondary | ICD-10-CM

## 2024-07-26 DIAGNOSIS — Z09 Encounter for follow-up examination after completed treatment for conditions other than malignant neoplasm: Secondary | ICD-10-CM

## 2024-07-26 DIAGNOSIS — I480 Paroxysmal atrial fibrillation: Secondary | ICD-10-CM

## 2024-07-26 DIAGNOSIS — Z79899 Other long term (current) drug therapy: Secondary | ICD-10-CM | POA: Diagnosis not present

## 2024-07-26 NOTE — Progress Notes (Signed)
 Established Patient Office Visit  Subjective   Patient ID: [name], male    DOB: Sep 14, 1931  Age: 89 y.o. MRN: [id number]  Chief Complaint  Patient presents with   Hospitalization Follow-up    Patient is here for a follow-up and has been weak and not eating    HPI   [name] is seen for hospital follow-up.  His chronic problems include history of hypertension, atrial fibrillation, GERD, recurrent skin cancers, chronic kidney disease stage III, lumbar spinal stenosis  He apparently fell on a Friday with abrasion right elbow region.  They tried to bandage this at home but he had some ongoing oozing and bleeding (on Eliquis ).  Ended up going to urgent care through [facility] 11-17 and then eventually to [facility] 11-18.  X-rays no fracture.  By the 19th he had some increasing confusion and fever up to 101.  Was seen back in the ER and then admitted for concern for possible sepsis.  White blood count 9.9 thousand.  Chest x-ray showed possible opacity with atelectasis versus infection.  He had TEE which showed no vegetation.  Question of worsening infiltrate on the 22nd.  Had positive culture for staph lugdunensis.  Treated with 5 days of NSAID and then transition to linezolid  for full 14-day course.  Recent albumin 2.6.  Wife states his appetite has been poor for quite some time.  He has had some general malaise and generalized weakness.  Very inactive.  They state that home PT was set up from hospital but they have not heard anything back yet.  He does have some chronic peripheral edema currently stable on Lasix  40 mg every other day.  Past Medical History:  Diagnosis Date   Actinic keratosis 03/13/2009   Arthritis    BENIGN PROSTATIC HYPERTROPHY, HX OF 04/07/2009   CARCINOMA, SKIN, SQUAMOUS CELL 09/11/2009   COLONIC POLYPS, HX OF 03/13/2009   Essential hypertension    GERD 03/13/2009   Hyperlipidemia    INSOMNIA, CHRONIC 09/11/2009   Persistent atrial fibrillation  (HCC) 04/07/2009   a. s/p afib ablation at [facility] x 2 in 2010.   POLYPECTOMY, HX OF 04/07/2009   Premature atrial contractions    PULMONARY NODULE 09/11/2009   PVC's (premature ventricular contractions)    Rosacea 03/13/2009   SKIN CANCER, HX OF 03/13/2009   Past Surgical History:  Procedure Laterality Date   ABLATION  2010   x2   COLONOSCOPY W/ BIOPSIES AND POLYPECTOMY     EYE SURGERY Bilateral    cataracts   LUMBAR DISC SURGERY     RUPTURE   LUMBAR LAMINECTOMY/DECOMPRESSION MICRODISCECTOMY N/A 04/20/2014   Procedure: LUMBAR TWO-THREE, LUMBAR THREE-FOUR, LUMAR FOUR-FIVE LAMINECTOMIES;  Surgeon: Reyes Budge, MD;  Location: [facility]UR[facility]S;  Service: Neurosurgery;  Laterality: N/A;   POLYPECTOMY     SKIN CANCER EXCISION     TEE WITHOUT CARDIOVERSION N/A 06/01/2023   Procedure: TRANSESOPHAGEAL ECHOCARDIOGRAM;  Surgeon: Okey Vina GAILS, MD;  Location: [facility] INVASIVE CV LAB;  Service: Cardiovascular;  Laterality: N/A;   TRANSESOPHAGEAL ECHOCARDIOGRAM (CATH LAB) N/A 07/15/2024   Procedure: TRANSESOPHAGEAL ECHOCARDIOGRAM;  Surgeon: Michele Richardson, DO;  Location: [facility] INVASIVE CV LAB;  Service: Cardiovascular;  Laterality: N/A;    reports that he quit smoking about 36 years ago. His smoking use included cigarettes. He started smoking about 66 years ago. He has a 30 pack-year smoking history. He has quit using smokeless tobacco.  His smokeless tobacco use included chew. He reports that he does not  drink alcohol and does not use drugs. family history includes CAD in his brother, brother, and sister; Cancer in his mother; Cancer (age of onset: 39) in his father; Heart attack in his father; Heart disease in his brother, brother, father, and sister. Allergies  Allergen Reactions   Losartan  Nausea And Vomiting and Other (See Comments)    Elevated creatinine levels       Review of Systems  Constitutional:  Positive for malaise/fatigue. Negative for chills and fever.  Eyes:  Negative for blurred vision.   Respiratory:  Negative for shortness of breath.   Cardiovascular:  Negative for chest pain.  Gastrointestinal:  Negative for abdominal pain.  Neurological:  Negative for dizziness, weakness and headaches.      Objective:     BP 138/62 (BP Location: Left Arm, Patient Position: Sitting, Cuff Size: Large)   Pulse 88   Temp 97.7 F (36.5 C) (Oral)   Wt 184 lb 1.6 oz (83.5 kg)   SpO2 90%   BMI 23.64 kg/m  BP Readings from Last 3 Encounters:  07/26/24 138/62  07/18/24 127/65  07/12/24 (!) 145/45   Wt Readings from Last 3 Encounters:  07/26/24 184 lb 1.6 oz (83.5 kg)  07/13/24 185 lb (83.9 kg)  07/12/24 185 lb (83.9 kg)      Physical Exam Vitals reviewed.  Constitutional:      General: He is not in acute distress.    Appearance: He is not ill-appearing.  HENT:     Head: Normocephalic and atraumatic.  Cardiovascular:     Rate and Rhythm: Normal rate.  Pulmonary:     Effort: Pulmonary effort is normal.     Breath sounds: Normal breath sounds. No wheezing or rales.  Musculoskeletal:     Right lower leg: No edema.     Left lower leg: No edema.  Skin:    Comments: Right elbow reveals healing abrasions.  No cellulitis changes.  No warmth.  No significant erythema.  Neurological:     Mental Status: He is alert.      No results found for any visits on 07/26/24.    The ASCVD Risk score (Arnett DK, et al., 2019) failed to calculate for the following reasons:   The 2019 ASCVD risk score is only valid for ages 51 to 37    Assessment & Plan:   #1 right elbow abrasion with recent cellulitis improved following IV antibiotics.  He has completed oral antibiotics as well.  No active cellulitis changes at this time.  Abrasions appear to be healing fairly well.  Continue good local wound care.  #2 protein calorie malnutrition.  Recent albumin 2.6.  Recent poor appetite.  Discussed ideas for increasing nutrient and protein intake in particular.  #3 history of bilateral leg  edema.  Recent echo EF 60 to 65%.  No major valve abnormalities.  Continue furosemide  40 mg every other day.  Recheck basic metabolic panel  #4 history of atrial fibrillation.  On chronic low-dose Eliquis .  Also takes Toprol -XL 50 mg once daily.   Return in about 1 month (around 08/26/2024).    Wolm Scarlet, MD

## 2024-07-26 NOTE — Patient Instructions (Signed)
 Let me know if you have not heard about home PT in next couple of days.

## 2024-07-27 ENCOUNTER — Ambulatory Visit: Payer: Self-pay | Admitting: Family Medicine

## 2024-07-27 LAB — BASIC METABOLIC PANEL WITH GFR
BUN: 37 mg/dL — ABNORMAL HIGH (ref 6–23)
CO2: 26 meq/L (ref 19–32)
Calcium: 9.3 mg/dL (ref 8.4–10.5)
Chloride: 104 meq/L (ref 96–112)
Creatinine, Ser: 1.77 mg/dL — ABNORMAL HIGH (ref 0.40–1.50)
GFR: 33.41 mL/min — ABNORMAL LOW (ref >60.00)
Glucose, Bld: 137 mg/dL — ABNORMAL HIGH (ref 70–99)
Potassium: 4.6 meq/L (ref 3.5–5.1)
Sodium: 140 meq/L (ref 135–145)

## 2024-08-03 ENCOUNTER — Ambulatory Visit: Attending: Cardiology | Admitting: Cardiology

## 2024-08-03 ENCOUNTER — Encounter: Payer: Self-pay | Admitting: Cardiology

## 2024-08-03 VITALS — BP 132/62 | HR 88 | Ht 74.0 in | Wt 184.0 lb

## 2024-08-03 DIAGNOSIS — E7800 Pure hypercholesterolemia, unspecified: Secondary | ICD-10-CM | POA: Diagnosis not present

## 2024-08-03 DIAGNOSIS — R0789 Other chest pain: Secondary | ICD-10-CM

## 2024-08-03 DIAGNOSIS — I4821 Permanent atrial fibrillation: Secondary | ICD-10-CM | POA: Diagnosis not present

## 2024-08-03 DIAGNOSIS — I10 Essential (primary) hypertension: Secondary | ICD-10-CM

## 2024-08-03 MED ORDER — LANSOPRAZOLE 30 MG PO CPDR: DELAYED_RELEASE_CAPSULE | ORAL | 11 refills | Status: AC

## 2024-08-03 MED ORDER — APIXABAN 2.5 MG PO TABS: 2.5000 mg | ORAL_TABLET | Freq: Two times a day (BID) | ORAL | 3 refills | Status: AC

## 2024-08-03 NOTE — Progress Notes (Signed)
 Cardiology Office Note:    Date:  08/03/2024   ID:  [name], DOB Jan 10, 1932, MRN [id number]  PCP:  [name], MD  Cardiologist:  Wilbert Bihari, MD    Referring MD: [name], MD   No chief complaint on file.   History of Present Illness:    [name] is a 89 y.o. male with a hx of permanent atrial fibrillation s/p afib ablation at [facility] x 2 in 2010, HTN, hyperlipidemia, PACs/PVCs (by monitor 2017), CKD stage III.   He has a history of prior atypical chest pain and negative stress test.  2D echo 04/08/19 showed EF 55-60%, no significant abnormalities. Outpatient nuc for chest pain was arranged 04/15/19 which was normal, EF 55%.   He is here today doing well. He was admitted in Nov 2025 with sepsis secondary to staph lugdunensis from RUE cellulitis.  TEE showed EF 60-65% with trivial MR and no vegetation.  He denies any chest pain or pressure,  PND, orthopnea, lower extremity edema, dizziness, palpitations or syncope. He has chronic DOE that is very stable.   Past Medical History:  Diagnosis Date   Actinic keratosis 03/13/2009   Arthritis    BENIGN PROSTATIC HYPERTROPHY, HX OF 04/07/2009   CARCINOMA, SKIN, SQUAMOUS CELL 09/11/2009   COLONIC POLYPS, HX OF 03/13/2009   Essential hypertension    GERD 03/13/2009   Hyperlipidemia    INSOMNIA, CHRONIC 09/11/2009   Permanent atrial fibrillation (HCC) 04/07/2009   a. s/p afib ablation at [facility] x 2 in 2010.   POLYPECTOMY, HX OF 04/07/2009   Premature atrial contractions    PULMONARY NODULE 09/11/2009   PVC's (premature ventricular contractions)    Rosacea 03/13/2009   SKIN CANCER, HX OF 03/13/2009    Past Surgical History:  Procedure Laterality Date   ABLATION  2010   x2   COLONOSCOPY W/ BIOPSIES AND POLYPECTOMY     EYE SURGERY Bilateral    cataracts   LUMBAR DISC SURGERY     RUPTURE   LUMBAR LAMINECTOMY/DECOMPRESSION MICRODISCECTOMY N/A 04/20/2014   Procedure: LUMBAR TWO-THREE, LUMBAR THREE-FOUR,  LUMAR FOUR-FIVE LAMINECTOMIES;  Surgeon: Reyes Budge, MD;  Location: [facility] NEURO ORS;  Service: Neurosurgery;  Laterality: N/A;   POLYPECTOMY     SKIN CANCER EXCISION     TEE WITHOUT CARDIOVERSION N/A 06/01/2023   Procedure: TRANSESOPHAGEAL ECHOCARDIOGRAM;  Surgeon: Okey Vina GAILS, MD;  Location: [facility] INVASIVE CV LAB;  Service: Cardiovascular;  Laterality: N/A;   TRANSESOPHAGEAL ECHOCARDIOGRAM (CATH LAB) N/A 07/15/2024   Procedure: TRANSESOPHAGEAL ECHOCARDIOGRAM;  Surgeon: Michele Richardson, DO;  Location: [facility] INVASIVE CV LAB;  Service: Cardiovascular;  Laterality: N/A;    Current Medications: Current Meds  Medication Sig   acetaminophen  (TYLENOL ) 500 MG tablet Take 500 mg by mouth every 6 (six) hours as needed for mild pain.   albuterol  (VENTOLIN  HFA) 108 (90 Base) MCG/ACT inhaler Inhale 2 puffs into the lungs every 6 (six) hours as needed for wheezing or shortness of breath.   amLODipine  (NORVASC ) 2.5 MG tablet TAKE 1 TABLET DAILY   budesonide -formoterol  (BREYNA ) 80-4.5 MCG/ACT inhaler Inhale 2 puffs into the lungs in the morning and at bedtime.   finasteride  (PROSCAR ) 5 MG tablet TAKE 1 TABLET (5 MG TOTAL) BY MOUTH DAILY.   furosemide  (LASIX ) 40 MG tablet Take 1 tablet (40 mg total) by mouth every other day. TAKE 1 TABLET EVERY DAY AS NEEDED FOR LEG SWELLING   gabapentin  (NEURONTIN ) 100 MG capsule Take 100 mg by mouth 2 (two) times  daily.   glucosamine-chondroitin 500-400 MG tablet Take 2 tablets by mouth daily.   lovastatin  (MEVACOR ) 20 MG tablet TAKE 1 TABLET BY MOUTH EVERYDAY AT BEDTIME (Patient taking differently: Take 20 mg by mouth at bedtime.)   metoprolol  succinate (TOPROL -XL) 50 MG 24 hr tablet TAKE 1 TABLET BY MOUTH EVERY DAY WITH OR IMMEDIATELY FOLLOWING A MEAL (Patient taking differently: Take 50 mg by mouth daily.)   Spacer/Aero-Holding Chambers DEVI 1 each by Does not apply route as needed.   tamsulosin  (FLOMAX ) 0.4 MG CAPS capsule Take 1 capsule (0.4 mg total) by mouth daily. (Patient  taking differently: Take 0.4 mg by mouth in the morning and at bedtime.)   vitamin B-12 (CYANOCOBALAMIN ) 500 MCG tablet Take 500 mcg by mouth daily.   VITAMIN D PO Take 400 Units by mouth daily.    [DISCONTINUED] apixaban  (ELIQUIS ) 2.5 MG TABS tablet Take 1 tablet (2.5 mg total) by mouth 2 (two) times daily.   [DISCONTINUED] lansoprazole  (PREVACID ) 30 MG capsule TAKE 1 CAPSULE EVERY DAY AT 12 NOON. (Patient taking differently: Take 30 mg by mouth daily at 12 noon.)     Allergies:   Losartan    Social History   Socioeconomic History   Marital status: Married    Spouse name: Not on file   Number of children: Not on file   Years of education: Not on file   Highest education level: Bachelor's degree (e.g., BA, AB, BS)  Occupational History   Not on file  Tobacco Use   Smoking status: Former    Current packs/day: 0.00    Average packs/day: 1 pack/day for 30.0 years (30.0 ttl pk-yrs)    Types: Cigarettes    Start date: 03/13/1958    Quit date: 03/13/1988    Years since quitting: 36.4   Smokeless tobacco: Former    Types: Chew  Substance and Sexual Activity   Alcohol use: No   Drug use: No   Sexual activity: Not on file  Other Topics Concern   Not on file  Social History Narrative   Not on file   Social Drivers of Health   Financial Resource Strain: Low Risk  (05/01/2022)   Overall Financial Resource Strain (CARDIA)    Difficulty of Paying Living Expenses: Not hard at all  Food Insecurity: No Food Insecurity (07/13/2024)   Hunger Vital Sign    Worried About Running Out of Food in the Last Year: Never true    Ran Out of Food in the Last Year: Never true  Transportation Needs: No Transportation Needs (07/13/2024)   PRAPARE - Administrator, Civil Service (Medical): No    Lack of Transportation (Non-Medical): No  Physical Activity: Unknown (05/01/2022)   Exercise Vital Sign    Days of Exercise per Week: 0 days    Minutes of Exercise per Session: Not on file  Stress:  No Stress Concern Present (05/01/2022)   [employer] of Occupational Health - Occupational Stress Questionnaire    Feeling of Stress : Not at all  Social Connections: Moderately Integrated (07/13/2024)   Social Connection and Isolation Panel    Frequency of Communication with Friends and Family: More than three times a week    Frequency of Social Gatherings with Friends and Family: Once a week    Attends Religious Services: More than 4 times per year    Active Member of [employer] or Organizations: No    Attends Banker Meetings: Never    Marital Status: Married  Family History: The patient's family history includes CAD in his brother, brother, and sister; Cancer in his mother; Cancer (age of onset: 50) in his father; Heart attack in his father; Heart disease in his brother, brother, father, and sister.  ROS:   Please see the history of present illness.    ROS  All other systems reviewed and negative.   EKGs/Labs/Other Studies Reviewed:    The following studies were reviewed today: Prior OV notes   Recent Labs: 07/18/2024: ALT 9; Hemoglobin 10.4; Magnesium  1.7; Platelets 240 07/26/2024: BUN 37; Creatinine, Ser 1.77; Potassium 4.6; Sodium 140   Recent Lipid Panel    Component Value Date/Time   CHOL 116 02/03/2023 0806   CHOL 131 11/15/2020 0918   TRIG 165.0 (H) 02/03/2023 0806   HDL 30.60 (L) 02/03/2023 0806   HDL 33 (L) 11/15/2020 0918   CHOLHDL 4 02/03/2023 0806   VLDL 33.0 02/03/2023 0806   LDLCALC 53 02/03/2023 0806   LDLCALC 74 11/15/2020 0918   LDLDIRECT 91.0 09/19/2020 1418    Physical Exam:    VS:  BP 132/62   Pulse 88   Ht 6' 2 (1.88 m)   Wt 184 lb (83.5 kg)   SpO2 99%   BMI 23.62 kg/m     Wt Readings from Last 3 Encounters:  08/03/24 184 lb (83.5 kg)  07/26/24 184 lb 1.6 oz (83.5 kg)  07/13/24 185 lb (83.9 kg)    GEN: Well nourished, well developed in no acute distress HEENT: Normal NECK: No JVD; No carotid bruits LYMPHATICS: No  lymphadenopathy CARDIAC:RRR, no murmurs, rubs, gallops RESPIRATORY:  Clear to auscultation without rales, wheezing or rhonchi  ABDOMEN: Soft, non-tender, non-distended MUSCULOSKELETAL:  No edema; No deformity  SKIN: Warm and dry NEUROLOGIC:  Alert and oriented x 3 PSYCHIATRIC:  Normal affect  ASSESSMENT:    1. Permanent atrial fibrillation (HCC)   2. Atypical chest pain   3. Essential hypertension   4. Pure hypercholesterolemia    PLAN:    In order of problems listed above:  Permanent atrial fibrillation -He is maintaining normal sinus rhythm on exam and denies any palpitations. -No bleeding issues on DOAC -Continue Eliquis  2.5 mg twice daily (dosed for age >43 and SCr >1.5) -Continue Toprol -XL 50 mg daily with as needed refills  -I have personally reviewed and interpreted outside labs performed by patient's PCP which showed SCr 1.77 and potassium 4.6 on 07/26/2024 as well as hemoglobin 10.4  Atypical chest pain -felt to be GI related -He has not had any anginal symptoms -he has chronic DOE when walking to the mailbox related to problems with severe back pain that is chronic and stable -nuclear stress test showed no ischemia 8/'2020  HTN -BP controlled on exam today -Continue amlodipine  2.5 mg daily, Toprol -XL 50 mg daily with as needed refills  HLD -LDL goal < 100 -Continue  Mevacor  20 mg daily with as needed refills - Followed by PCP  Followup with me in 1 year  Medication Adjustments/Labs and Tests Ordered: Current medicines are reviewed at length with the patient today.  Concerns regarding medicines are outlined above.  No orders of the defined types were placed in this encounter.  Meds ordered this encounter  Medications   lansoprazole  (PREVACID ) 30 MG capsule    Sig: TAKE 1 CAPSULE EVERY DAY AT 12 NOON.    Dispense:  30 capsule    Refill:  11   apixaban  (ELIQUIS ) 2.5 MG TABS tablet    Sig: Take 1 tablet (  2.5 mg total) by mouth 2 (two) times daily.     Dispense:  180 tablet    Refill:  3    Signed, Wilbert Bihari, MD  08/03/2024 10:09 AM    [facility]

## 2024-08-03 NOTE — Patient Instructions (Signed)

## 2024-08-05 ENCOUNTER — Other Ambulatory Visit: Payer: Self-pay | Admitting: Family Medicine

## 2024-08-17 ENCOUNTER — Other Ambulatory Visit: Payer: Self-pay | Admitting: Family Medicine

## 2024-09-15 LAB — OPHTHALMOLOGY REPORT-SCANNED
